# Patient Record
Sex: Female | Born: 1940 | Race: White | Hispanic: No | Smoking: Former smoker
Health system: Southern US, Community
[De-identification: ages and names within clinical notes are randomized; demographics above are authoritative.]

## PROBLEM LIST (undated history)

## (undated) DIAGNOSIS — R42 Dizziness and giddiness: Secondary | ICD-10-CM

## (undated) DIAGNOSIS — N939 Abnormal uterine and vaginal bleeding, unspecified: Secondary | ICD-10-CM

## (undated) DIAGNOSIS — F32A Depression, unspecified: Secondary | ICD-10-CM

## (undated) DIAGNOSIS — L900 Lichen sclerosus et atrophicus: Secondary | ICD-10-CM

## (undated) DIAGNOSIS — I6381 Other cerebral infarction due to occlusion or stenosis of small artery: Secondary | ICD-10-CM

## (undated) DIAGNOSIS — I10 Essential (primary) hypertension: Secondary | ICD-10-CM

## (undated) DIAGNOSIS — M1990 Unspecified osteoarthritis, unspecified site: Secondary | ICD-10-CM

## (undated) DIAGNOSIS — B019 Varicella without complication: Secondary | ICD-10-CM

## (undated) DIAGNOSIS — I639 Cerebral infarction, unspecified: Secondary | ICD-10-CM

## (undated) DIAGNOSIS — F329 Major depressive disorder, single episode, unspecified: Secondary | ICD-10-CM

## (undated) DIAGNOSIS — Z9289 Personal history of other medical treatment: Secondary | ICD-10-CM

## (undated) DIAGNOSIS — E785 Hyperlipidemia, unspecified: Secondary | ICD-10-CM

## (undated) DIAGNOSIS — F419 Anxiety disorder, unspecified: Secondary | ICD-10-CM

## (undated) DIAGNOSIS — G5602 Carpal tunnel syndrome, left upper limb: Secondary | ICD-10-CM

## (undated) DIAGNOSIS — K219 Gastro-esophageal reflux disease without esophagitis: Secondary | ICD-10-CM

## (undated) HISTORY — PX: JOINT REPLACEMENT: SHX530

## (undated) HISTORY — DX: Depression, unspecified: F32.A

## (undated) HISTORY — DX: Lichen sclerosus et atrophicus: L90.0

## (undated) HISTORY — DX: Abnormal uterine and vaginal bleeding, unspecified: N93.9

## (undated) HISTORY — PX: SKIN LESION EXCISION: SHX2412

## (undated) HISTORY — DX: Dizziness and giddiness: R42

## (undated) HISTORY — DX: Unspecified osteoarthritis, unspecified site: M19.90

## (undated) HISTORY — PX: COLONOSCOPY: SHX174

## (undated) HISTORY — DX: Hyperlipidemia, unspecified: E78.5

## (undated) HISTORY — DX: Varicella without complication: B01.9

## (undated) HISTORY — DX: Personal history of other medical treatment: Z92.89

## (undated) HISTORY — DX: Other cerebral infarction due to occlusion or stenosis of small artery: I63.81

## (undated) HISTORY — PX: BACK SURGERY: SHX140

## (undated) HISTORY — PX: EYE SURGERY: SHX253

## (undated) HISTORY — PX: TONSILLECTOMY AND ADENOIDECTOMY: SUR1326

## (undated) HISTORY — DX: Essential (primary) hypertension: I10

## (undated) HISTORY — DX: Major depressive disorder, single episode, unspecified: F32.9

---

## 2004-11-10 ENCOUNTER — Ambulatory Visit: Payer: Self-pay | Admitting: Gynecology

## 2005-10-04 ENCOUNTER — Ambulatory Visit: Payer: Self-pay | Admitting: Family Medicine

## 2005-12-20 ENCOUNTER — Ambulatory Visit: Payer: Self-pay | Admitting: Gynecology

## 2006-03-08 ENCOUNTER — Ambulatory Visit: Payer: Self-pay

## 2006-06-29 ENCOUNTER — Emergency Department: Payer: Self-pay | Admitting: Emergency Medicine

## 2006-07-03 ENCOUNTER — Ambulatory Visit: Payer: Self-pay | Admitting: Family Medicine

## 2006-11-26 ENCOUNTER — Ambulatory Visit: Payer: Self-pay | Admitting: Gynecology

## 2006-11-26 ENCOUNTER — Encounter (INDEPENDENT_AMBULATORY_CARE_PROVIDER_SITE_OTHER): Payer: Self-pay | Admitting: Specialist

## 2006-12-25 ENCOUNTER — Encounter: Admission: RE | Admit: 2006-12-25 | Discharge: 2006-12-25 | Payer: Self-pay | Admitting: Gynecology

## 2007-02-19 ENCOUNTER — Ambulatory Visit: Payer: Self-pay | Admitting: Family Medicine

## 2007-02-21 ENCOUNTER — Ambulatory Visit (HOSPITAL_COMMUNITY): Admission: RE | Admit: 2007-02-21 | Discharge: 2007-02-21 | Payer: Self-pay | Admitting: Gynecology

## 2007-04-17 ENCOUNTER — Encounter: Admission: RE | Admit: 2007-04-17 | Discharge: 2007-04-17 | Payer: Self-pay | Admitting: Neurosurgery

## 2007-11-27 ENCOUNTER — Ambulatory Visit: Payer: Self-pay | Admitting: General Practice

## 2008-02-25 ENCOUNTER — Ambulatory Visit: Payer: Self-pay | Admitting: Obstetrics and Gynecology

## 2008-02-25 ENCOUNTER — Encounter: Payer: Self-pay | Admitting: Obstetrics and Gynecology

## 2009-10-12 ENCOUNTER — Ambulatory Visit: Payer: Self-pay | Admitting: Gastroenterology

## 2009-10-14 ENCOUNTER — Ambulatory Visit: Payer: Self-pay | Admitting: Nurse Practitioner

## 2010-08-19 ENCOUNTER — Ambulatory Visit: Payer: Self-pay | Admitting: Urology

## 2010-08-22 ENCOUNTER — Ambulatory Visit: Payer: Self-pay | Admitting: Urology

## 2010-11-15 ENCOUNTER — Ambulatory Visit: Payer: Self-pay | Admitting: Otolaryngology

## 2010-12-20 NOTE — Assessment & Plan Note (Signed)
NAME:  Ashley [name], KE NO.:  [id number]   MEDICAL RECORD NO.:  [id number]          PATIENT TYPE:  POB   LOCATION:  [facility] at [facility]         FACILITY:  [facility]   PHYSICIAN:  Argentina Donovan, MD        DATE OF BIRTH:  1941/05/18   DATE OF SERVICE:  02/25/2008                                  CLINIC NOTE   The patient is a 70 year old Caucasian female who went through menopause  in the late 30s to early 45s and has been on estrogen ever since.  She  came in with some spotting 1 year ago, had an endometrial biopsy, which  was reported to her as normal.  She was having terrible hot flashes and  at that time she was Prempro and was added 625 of Premarin in addition.  She has had no bleeding at all over the past year.  An ultrasound a year  ago showed a normal endometrial stripe.  She is feeling very well.  She  is on several other medications; Vytorin, Effexor, aspirin daily,  Prilosec, Trimplex, vitamin B12, and calcium.  She does not smoke and up  until a few years ago, was a marathon runner.   PHYSICAL EXAMINATION:  VITAL SIGNS:  Her blood pressure was 145/87,  pulse 74.  The patient weighs 130 pounds, is 5 feet __________ inches  tall, i.e., she lost 15 pounds over the past year voluntarily.  HEENT:  Within normal limits.  NECK:  Supple.  The thyroid is symmetrical with no masses.  LUNGS:  Clear to auscultation and percussion.  HEART:  No murmur.  Normal sinus rhythm.  ABDOMEN:  Soft, flat, and nontender.  No masses, no organomegaly.  GENITOURINARY:  External genitalia is normal.  BUS within normal limits.  The vagina is clean and quite well rugated.  The cervix is clean and  stenotic.  The uterus is anterior, normal size, shape, and consistency.  The adnexa could not be palpated.  RECTAL:  No masses.   IMPRESSION:  Normal gynecological examination.   PLAN:  Mammogram followup.  Continue on her Prempro at her desire as  well as the Premarin.  We have discussed  possible risks and the benefits  in this lady's case.           ______________________________  Argentina Donovan, MD     PR/MEDQ  D:  02/25/2008  T:  02/26/2008  Job:  [id number]

## 2014-12-28 DIAGNOSIS — E7800 Pure hypercholesterolemia, unspecified: Secondary | ICD-10-CM | POA: Insufficient documentation

## 2014-12-28 DIAGNOSIS — F39 Unspecified mood [affective] disorder: Secondary | ICD-10-CM | POA: Insufficient documentation

## 2014-12-28 DIAGNOSIS — F325 Major depressive disorder, single episode, in full remission: Secondary | ICD-10-CM | POA: Insufficient documentation

## 2014-12-28 DIAGNOSIS — F32A Depression, unspecified: Secondary | ICD-10-CM | POA: Insufficient documentation

## 2014-12-28 DIAGNOSIS — F329 Major depressive disorder, single episode, unspecified: Secondary | ICD-10-CM | POA: Insufficient documentation

## 2014-12-28 DIAGNOSIS — E785 Hyperlipidemia, unspecified: Secondary | ICD-10-CM | POA: Insufficient documentation

## 2015-08-10 DIAGNOSIS — H18411 Arcus senilis, right eye: Secondary | ICD-10-CM | POA: Diagnosis not present

## 2015-08-10 DIAGNOSIS — H26491 Other secondary cataract, right eye: Secondary | ICD-10-CM | POA: Diagnosis not present

## 2015-08-10 DIAGNOSIS — Z961 Presence of intraocular lens: Secondary | ICD-10-CM | POA: Diagnosis not present

## 2015-09-14 DIAGNOSIS — M47812 Spondylosis without myelopathy or radiculopathy, cervical region: Secondary | ICD-10-CM | POA: Diagnosis not present

## 2015-09-14 DIAGNOSIS — M542 Cervicalgia: Secondary | ICD-10-CM | POA: Diagnosis not present

## 2015-11-11 DIAGNOSIS — R42 Dizziness and giddiness: Secondary | ICD-10-CM | POA: Diagnosis not present

## 2015-11-11 DIAGNOSIS — H6121 Impacted cerumen, right ear: Secondary | ICD-10-CM | POA: Diagnosis not present

## 2015-11-29 DIAGNOSIS — Z961 Presence of intraocular lens: Secondary | ICD-10-CM | POA: Diagnosis not present

## 2015-12-15 DIAGNOSIS — R69 Illness, unspecified: Secondary | ICD-10-CM | POA: Diagnosis not present

## 2015-12-15 DIAGNOSIS — I10 Essential (primary) hypertension: Secondary | ICD-10-CM | POA: Diagnosis not present

## 2015-12-15 DIAGNOSIS — E7800 Pure hypercholesterolemia, unspecified: Secondary | ICD-10-CM | POA: Diagnosis not present

## 2016-02-24 DIAGNOSIS — R1031 Right lower quadrant pain: Secondary | ICD-10-CM | POA: Diagnosis not present

## 2016-02-25 ENCOUNTER — Other Ambulatory Visit: Payer: Self-pay | Admitting: Internal Medicine

## 2016-02-25 ENCOUNTER — Ambulatory Visit
Admission: RE | Admit: 2016-02-25 | Discharge: 2016-02-25 | Disposition: A | Discharge status: Home / Self Care | Payer: Medicare HMO | Source: Ambulatory Visit | Attending: Internal Medicine | Admitting: Internal Medicine

## 2016-02-25 DIAGNOSIS — K760 Fatty (change of) liver, not elsewhere classified: Secondary | ICD-10-CM | POA: Insufficient documentation

## 2016-02-25 DIAGNOSIS — R160 Hepatomegaly, not elsewhere classified: Secondary | ICD-10-CM | POA: Diagnosis not present

## 2016-02-25 DIAGNOSIS — I700 Atherosclerosis of aorta: Secondary | ICD-10-CM | POA: Diagnosis not present

## 2016-02-25 DIAGNOSIS — R319 Hematuria, unspecified: Secondary | ICD-10-CM

## 2016-02-25 DIAGNOSIS — H6123 Impacted cerumen, bilateral: Secondary | ICD-10-CM | POA: Diagnosis not present

## 2016-02-25 DIAGNOSIS — H6062 Unspecified chronic otitis externa, left ear: Secondary | ICD-10-CM | POA: Diagnosis not present

## 2016-02-25 DIAGNOSIS — R1031 Right lower quadrant pain: Secondary | ICD-10-CM | POA: Diagnosis not present

## 2016-03-01 DIAGNOSIS — R3129 Other microscopic hematuria: Secondary | ICD-10-CM | POA: Diagnosis not present

## 2016-03-16 DIAGNOSIS — R9341 Abnormal radiologic findings on diagnostic imaging of renal pelvis, ureter, or bladder: Secondary | ICD-10-CM | POA: Diagnosis not present

## 2016-03-16 DIAGNOSIS — R1031 Right lower quadrant pain: Secondary | ICD-10-CM | POA: Diagnosis not present

## 2016-03-28 DIAGNOSIS — R9341 Abnormal radiologic findings on diagnostic imaging of renal pelvis, ureter, or bladder: Secondary | ICD-10-CM | POA: Diagnosis not present

## 2016-03-30 ENCOUNTER — Ambulatory Visit (INDEPENDENT_AMBULATORY_CARE_PROVIDER_SITE_OTHER): Payer: Medicare HMO | Admitting: Podiatry

## 2016-03-30 VITALS — BP 142/88 | HR 97 | Resp 14

## 2016-03-30 DIAGNOSIS — M205X1 Other deformities of toe(s) (acquired), right foot: Secondary | ICD-10-CM

## 2016-03-30 DIAGNOSIS — T300 Burn of unspecified body region, unspecified degree: Secondary | ICD-10-CM

## 2016-03-30 DIAGNOSIS — M2041 Other hammer toe(s) (acquired), right foot: Secondary | ICD-10-CM | POA: Diagnosis not present

## 2016-03-30 DIAGNOSIS — L84 Corns and callosities: Secondary | ICD-10-CM

## 2016-03-30 NOTE — Progress Notes (Signed)
   Subjective:    Patient ID: Ashley [name], female    DOB: July 09, 1941, 75 y.o.   MRN: [id number]  HPI this patient presents the office concerned about her second toe on her right foot. She says she has developed a corn on the tip of her right foot for the last 6 months. She says she applied acid to the corn approximately 3-4 days ago. She says the area has turned white and she is just concerned.  She presents the office for an evaluation and treatment of this. Callused area second toe right foot    Review of Systems  All other systems reviewed and are negative.      Objective:   Physical Exam GENERAL APPEARANCE: Alert, conversant. Appropriately groomed. No acute distress.  VASCULAR: Pedal pulses are  palpable at  [facility] bilateral.  Capillary refill time is immediate to all digits,  Normal temperature gradient.   NEUROLOGIC: sensation is normal to 5.07 monofilament at 5/5 sites bilateral.  Light touch is intact bilateral, Muscle strength normal.  MUSCULOSKELETAL: acceptable muscle strength, tone and stability bilateral.  Intrinsic muscluature intact bilateral.  Rectus appearance of foot and digits noted bilateral. Mallet toe second toe  B/L  DERMATOLOGIC: skin color, texture, and turgor are within normal limits.  No preulcerative lesions or ulcers  are seen, no interdigital maceration noted.  No open lesions present.  . No drainage noted.White necrotic tissue noted at the distal aspect lateral border second toe right foot.  No redness or swelling or infection.  Fissure noted at callus/burn site.         Assessment & Plan:  Burn second toe right foot.  Mallet tpoe second right foot.  IE  Debride callus/burned tissue.  Neosporin/DSD.  Home soaks were recommended.  Gel pad was dispensed.  Discussed possible toe surgery .   Helane GuntherGregory Mayer DPM

## 2016-04-11 ENCOUNTER — Ambulatory Visit: Payer: Self-pay | Admitting: Podiatry

## 2016-04-15 ENCOUNTER — Encounter: Payer: Self-pay | Admitting: Emergency Medicine

## 2016-04-15 ENCOUNTER — Emergency Department
Admission: EM | Admit: 2016-04-15 | Discharge: 2016-04-16 | Disposition: A | Discharge status: Home / Self Care | Payer: Medicare HMO | Attending: Emergency Medicine | Admitting: Emergency Medicine

## 2016-04-15 DIAGNOSIS — Z791 Long term (current) use of non-steroidal anti-inflammatories (NSAID): Secondary | ICD-10-CM | POA: Insufficient documentation

## 2016-04-15 DIAGNOSIS — Z7982 Long term (current) use of aspirin: Secondary | ICD-10-CM | POA: Insufficient documentation

## 2016-04-15 DIAGNOSIS — I10 Essential (primary) hypertension: Secondary | ICD-10-CM | POA: Diagnosis not present

## 2016-04-15 DIAGNOSIS — T783XXA Angioneurotic edema, initial encounter: Secondary | ICD-10-CM | POA: Diagnosis not present

## 2016-04-15 DIAGNOSIS — Z79899 Other long term (current) drug therapy: Secondary | ICD-10-CM | POA: Insufficient documentation

## 2016-04-15 DIAGNOSIS — R220 Localized swelling, mass and lump, head: Secondary | ICD-10-CM | POA: Diagnosis present

## 2016-04-15 MED ORDER — FAMOTIDINE IN NACL 20-0.9 MG/50ML-% IV SOLN
INTRAVENOUS | Status: AC
  Filled 2016-04-15: qty 50

## 2016-04-15 MED ORDER — DIPHENHYDRAMINE HCL 50 MG/ML IJ SOLN
50.0000 mg | Freq: Once | INTRAMUSCULAR | Status: AC
  Administered 2016-04-15: 50 mg via INTRAVENOUS

## 2016-04-15 MED ORDER — SODIUM CHLORIDE 0.9 % IV BOLUS (SEPSIS)
1000.0000 mL | Freq: Once | INTRAVENOUS | Status: AC
  Administered 2016-04-15: 1000 mL via INTRAVENOUS

## 2016-04-15 MED ORDER — HYDROCHLOROTHIAZIDE 25 MG PO TABS: 25.0000 mg | ORAL_TABLET | Freq: Every day | ORAL | 1 refills | Status: DC

## 2016-04-15 MED ORDER — METHYLPREDNISOLONE SODIUM SUCC 125 MG IJ SOLR
INTRAMUSCULAR | Status: AC
  Administered 2016-04-15: 125 mg via INTRAVENOUS
  Filled 2016-04-15: qty 2

## 2016-04-15 MED ORDER — DIPHENHYDRAMINE HCL 50 MG/ML IJ SOLN
INTRAMUSCULAR | Status: AC
  Administered 2016-04-15: 50 mg via INTRAVENOUS
  Filled 2016-04-15: qty 1

## 2016-04-15 MED ORDER — METHYLPREDNISOLONE SODIUM SUCC 125 MG IJ SOLR
125.0000 mg | Freq: Once | INTRAMUSCULAR | Status: AC
  Administered 2016-04-15: 125 mg via INTRAVENOUS

## 2016-04-15 NOTE — Discharge Instructions (Signed)
As we discussed please follow-up with Dr. Dareen PianoAnderson as soon as possible for recheck/reevaluation. Please discontinue use of losartan, and start hydrochlorothiazide. Return to the emergency department for any trouble breathing, any swelling in your mouth last throat, or any other symptom personally concerning to your self.

## 2016-04-15 NOTE — ED Provider Notes (Signed)
[facility] Emergency Department Provider Note  Time seen: 10:23 PM  I have reviewed the triage vital signs and the nursing notes.   HISTORY  Chief Complaint Allergic Reaction    HPI Ashley [name] is a 75 y.o. female with a past medical history of hypertension, hyperlipidemia, gastric reflux, who presents the emergency department with tongue swelling. According to the patient she ate shrimp tonight which she believes could've caused an allergic reaction although she states she has never been allergic to shrimp in the past. She does state 10 years he has she had a similar event causing tongue swelling but they never found out why. Patient denies any rash or itching, patient does have significant tongue swelling especially of the right side. Denies any trouble breathing at this time.  History reviewed. No pertinent past medical history.  There are no active problems to display for this patient.   History reviewed. No pertinent surgical history.  Prior to Admission medications   Medication Sig Start Date End Date Taking? Authorizing Provider  acetaminophen (TYLENOL) 500 MG tablet Take 1,000 mg by mouth.    Historical Provider, MD  aspirin EC 81 MG tablet Take 81 mg by mouth.    Historical Provider, MD  Cholecalciferol (D 1000) 1000 units CHEW Chew by mouth daily.     Historical Provider, MD  ciprofloxacin (CIPRO) 500 MG tablet Take 500 mg by mouth. 03/28/16   Historical Provider, MD  estradiol (ESTRACE) 1 MG tablet Take 1-2 tabs po once a day 06/10/15   Historical Provider, MD  fluticasone (FLONASE) 50 MCG/ACT nasal spray Place into the nose.    Historical Provider, MD  losartan-hydrochlorothiazide (HYZAAR) 100-12.5 MG tablet daily.  12/15/15   Historical Provider, MD  mometasone (ELOCON) 0.1 % lotion  02/25/16   Historical Provider, MD  niacin 500 MG tablet Take 500 mg by mouth.    Historical Provider, MD  Omega-3 1000 MG CAPS Take by mouth 2 (two) times daily.      Historical Provider, MD  omeprazole (PRILOSEC) 20 MG capsule TAKE 1 CAPSULE BY MOUTH EVERY DAY 07/05/15   Historical Provider, MD  progesterone (PROMETRIUM) 100 MG capsule daily.  03/13/16   Historical Provider, MD  venlafaxine XR (EFFEXOR-XR) 150 MG 24 hr capsule  01/12/16   Historical Provider, MD  vitamin C (ASCORBIC ACID) 500 MG tablet Take by mouth.    Historical Provider, MD    Allergies  Allergen Reactions  . Pravastatin Other (See Comments)    Other reaction(s): Muscle Pain  . Simvastatin Other (See Comments)    stiffness    History reviewed. No pertinent family history.  Social History Social History  Substance Use Topics  . Smoking status: Never Smoker  . Smokeless tobacco: Never Used  . Alcohol use Yes    Review of Systems Constitutional: Negative for fever. Cardiovascular: Negative for chest pain. Respiratory: Negative for shortness of breath.Negative for cough. Gastrointestinal: Negative for abdominal pain Skin: Negative for rash. Neurological: Negative for headache 10-point ROS otherwise negative.  ____________________________________________   PHYSICAL EXAM:  VITAL SIGNS: ED Triage Vitals  Enc Vitals Group     BP 04/15/16 2209 (!) 179/93     Pulse Rate 04/15/16 2209 99     Resp 04/15/16 2209 20     Temp 04/15/16 2209 97.6 F (36.4 C)     Temp Source 04/15/16 2209 Oral     SpO2 04/15/16 2209 99 %     Weight 04/15/16 2210 150 lb (68  kg)     Height 04/15/16 2210 4\' 10"  (1.473 m)     Head Circumference --      Peak Flow --      Pain Score 04/15/16 2210 0     Pain Loc --      Pain Edu? --      Excl. in GC? --     Constitutional: Alert and oriented. Well appearing and in no distress. Eyes: Normal exam ENT   Head: Normocephalic and atraumatic.   Mouth/Throat: Mucous membranes are moist.Moderate right-sided tongue swelling with mild left-sided tongue swelling, no lip swelling, oral pharynx appears normal. Cardiovascular: Normal rate, regular  rhythm. No murmur Respiratory: Normal respiratory effort without tachypnea nor retractions. Breath sounds are clear Gastrointestinal: Soft and nontender. No distention.   Musculoskeletal: Nontender with normal range of motion in all extremities. N Neurologic:  Normal speech and language. No gross focal neurologic deficits are appreciated. Skin:  Skin is warm, dry and intact. No rash noted. No urticaria. Psychiatric: Mood and affect are normal. Speech and behavior are normal.   ____________________________________________    INITIAL IMPRESSION / ASSESSMENT AND PLAN / ED COURSE  Pertinent labs & imaging results that were available during my care of the patient were reviewed by me and considered in my medical decision making (see chart for details).  The patient presents to the emergency department with right-sided tongue swelling. No hives or urticaria. Clear lung sounds. Exam is very consistent with angioedema. I reviewed the patient's medications, patient does take losartan but no ACE inhibitor.  We will treat with Solu-Medrol, fluids, IV Benadryl, IV Pepcid. Patient will require prolonged observation in the emergency department to ensure decrease in the size of her tongue. Currently there is no airway compromise. Patient able swallow secretions without issue.   ----------------------------------------- 11:18 PM on 04/15/2016 -----------------------------------------  Tongue appears slightly improved. We will continue to monitor in the emergency department. I highly suspect this is angioedema versus allergic reaction. I discussed with the patient stopping losartan and instead increasing her hydrochlorothiazide. Patient will follow up with her primary care doctor. Patient care signed out to Dr. Manson Passey for continued ER observation.  ____________________________________________   FINAL CLINICAL IMPRESSION(S) / ED DIAGNOSES  Angioedema    Minna Antis, MD 04/15/16 [phone]

## 2016-04-15 NOTE — ED Triage Notes (Addendum)
Pt reports allergic reaction, swelling of tongue and tightness in throat, reports difficulty breathing.  States she ate shrimp tonight and this has happened with shrimp in the past.  Pt reports taking 25mg  benadryl PTA

## 2016-04-16 DIAGNOSIS — Z7982 Long term (current) use of aspirin: Secondary | ICD-10-CM | POA: Diagnosis not present

## 2016-04-16 DIAGNOSIS — T783XXA Angioneurotic edema, initial encounter: Secondary | ICD-10-CM | POA: Diagnosis not present

## 2016-04-16 DIAGNOSIS — Z79899 Other long term (current) drug therapy: Secondary | ICD-10-CM | POA: Diagnosis not present

## 2016-04-16 DIAGNOSIS — Z791 Long term (current) use of non-steroidal anti-inflammatories (NSAID): Secondary | ICD-10-CM | POA: Diagnosis not present

## 2016-04-16 DIAGNOSIS — I10 Essential (primary) hypertension: Secondary | ICD-10-CM | POA: Diagnosis not present

## 2016-04-16 NOTE — ED Provider Notes (Signed)
Assumed care the patient from Dr.Paduchowski. Patient states that she feels much better and that her tongue has decreased in size considerably and is requesting discharge at this time.   Ashley Currentandolph N Brown, MD 04/16/16 [phone]

## 2016-04-16 NOTE — ED Notes (Signed)
Discharge instructions reviewed with patient. Patient verbalized understanding. Patient ambulated to lobby without difficulty.   

## 2016-04-17 ENCOUNTER — Other Ambulatory Visit: Payer: Self-pay | Admitting: Physical Medicine and Rehabilitation

## 2016-04-17 DIAGNOSIS — M5416 Radiculopathy, lumbar region: Secondary | ICD-10-CM

## 2016-04-17 DIAGNOSIS — M5136 Other intervertebral disc degeneration, lumbar region: Secondary | ICD-10-CM | POA: Diagnosis not present

## 2016-04-18 DIAGNOSIS — I10 Essential (primary) hypertension: Secondary | ICD-10-CM | POA: Diagnosis not present

## 2016-04-18 DIAGNOSIS — T783XXD Angioneurotic edema, subsequent encounter: Secondary | ICD-10-CM | POA: Diagnosis not present

## 2016-04-18 DIAGNOSIS — T783XXA Angioneurotic edema, initial encounter: Secondary | ICD-10-CM | POA: Insufficient documentation

## 2016-04-26 ENCOUNTER — Ambulatory Visit
Admission: RE | Admit: 2016-04-26 | Discharge: 2016-04-26 | Disposition: A | Discharge status: Home / Self Care | Payer: Medicare HMO | Source: Ambulatory Visit | Attending: Physical Medicine and Rehabilitation | Admitting: Physical Medicine and Rehabilitation

## 2016-04-26 DIAGNOSIS — M5137 Other intervertebral disc degeneration, lumbosacral region: Secondary | ICD-10-CM | POA: Insufficient documentation

## 2016-04-26 DIAGNOSIS — M2578 Osteophyte, vertebrae: Secondary | ICD-10-CM | POA: Diagnosis not present

## 2016-04-26 DIAGNOSIS — M4806 Spinal stenosis, lumbar region: Secondary | ICD-10-CM | POA: Insufficient documentation

## 2016-04-26 DIAGNOSIS — M5416 Radiculopathy, lumbar region: Secondary | ICD-10-CM | POA: Diagnosis present

## 2016-04-26 DIAGNOSIS — M545 Low back pain: Secondary | ICD-10-CM | POA: Diagnosis not present

## 2016-04-26 DIAGNOSIS — M5126 Other intervertebral disc displacement, lumbar region: Secondary | ICD-10-CM | POA: Insufficient documentation

## 2016-04-26 DIAGNOSIS — M5127 Other intervertebral disc displacement, lumbosacral region: Secondary | ICD-10-CM | POA: Insufficient documentation

## 2016-04-26 DIAGNOSIS — I10 Essential (primary) hypertension: Secondary | ICD-10-CM | POA: Diagnosis not present

## 2016-04-26 DIAGNOSIS — M5136 Other intervertebral disc degeneration, lumbar region: Secondary | ICD-10-CM | POA: Insufficient documentation

## 2016-05-10 ENCOUNTER — Encounter: Payer: Self-pay | Admitting: Emergency Medicine

## 2016-05-10 ENCOUNTER — Emergency Department
Admission: EM | Admit: 2016-05-10 | Discharge: 2016-05-10 | Disposition: A | Discharge status: Home / Self Care | Payer: Medicare HMO | Attending: Student | Admitting: Student

## 2016-05-10 DIAGNOSIS — Z7982 Long term (current) use of aspirin: Secondary | ICD-10-CM | POA: Diagnosis not present

## 2016-05-10 DIAGNOSIS — R220 Localized swelling, mass and lump, head: Secondary | ICD-10-CM | POA: Diagnosis not present

## 2016-05-10 DIAGNOSIS — Z791 Long term (current) use of non-steroidal anti-inflammatories (NSAID): Secondary | ICD-10-CM | POA: Insufficient documentation

## 2016-05-10 DIAGNOSIS — T7840XA Allergy, unspecified, initial encounter: Secondary | ICD-10-CM | POA: Insufficient documentation

## 2016-05-10 DIAGNOSIS — I10 Essential (primary) hypertension: Secondary | ICD-10-CM | POA: Diagnosis not present

## 2016-05-10 DIAGNOSIS — Z79899 Other long term (current) drug therapy: Secondary | ICD-10-CM | POA: Insufficient documentation

## 2016-05-10 DIAGNOSIS — Z7951 Long term (current) use of inhaled steroids: Secondary | ICD-10-CM | POA: Insufficient documentation

## 2016-05-10 MED ORDER — FAMOTIDINE IN NACL 20-0.9 MG/50ML-% IV SOLN
20.0000 mg | Freq: Once | INTRAVENOUS | Status: AC
  Administered 2016-05-10: 20 mg via INTRAVENOUS
  Filled 2016-05-10: qty 50

## 2016-05-10 MED ORDER — DIPHENHYDRAMINE HCL 50 MG/ML IJ SOLN
12.5000 mg | Freq: Once | INTRAMUSCULAR | Status: AC
  Administered 2016-05-10: 12.5 mg via INTRAVENOUS
  Filled 2016-05-10: qty 1

## 2016-05-10 MED ORDER — METHYLPREDNISOLONE SODIUM SUCC 125 MG IJ SOLR
125.0000 mg | Freq: Once | INTRAMUSCULAR | Status: AC
  Administered 2016-05-10: 125 mg via INTRAVENOUS
  Filled 2016-05-10: qty 2

## 2016-05-10 MED ORDER — PREDNISONE 20 MG PO TABS: 60.0000 mg | ORAL_TABLET | Freq: Every day | ORAL | 0 refills | Status: AC

## 2016-05-10 NOTE — ED Provider Notes (Signed)
[facility] Emergency Department Provider Note   ____________________________________________   First MD Initiated Contact with Patient 05/10/16 1025     (approximate)  I have reviewed the triage vital signs and the nursing notes.   HISTORY  Chief Complaint Facial Swelling    HPI Ashley [name] is a 75 y.o. female with history of hypertension and GERD who presents for evaluation of left lower lip swelling noted this morning on awakening, mild, no modifying factors, constant. Patient reports that she awoke from sleep and noted that her left lower lip was swollen. She denies any shortness of breath, no nausea, vomiting, no rash. She was seen in this emergency department and treated for an allergic reaction involving swelling of her tongue just last month and is scheduled to follow up with an allergist. Her losartan was discontinued at that time and she has just been taking HCTZ for blood pressure control. She did not use an EpiPen today. She did take "a swig of Benadryl" prior to arrival. No chest pain or difficulty breathing   History reviewed. No pertinent past medical history.  There are no active problems to display for this patient.   History reviewed. No pertinent surgical history.  Prior to Admission medications   Medication Sig Start Date End Date Taking? Authorizing Provider  acetaminophen (TYLENOL) 500 MG tablet Take 1,000 mg by mouth.   Yes Historical Provider, MD  aspirin EC 81 MG tablet Take 81 mg by mouth.   Yes Historical Provider, MD  Cholecalciferol (D 1000) 1000 units CHEW Chew by mouth daily.    Yes Historical Provider, MD  estradiol (ESTRACE) 1 MG tablet Take 1-2 tabs po once a day 06/10/15  Yes Historical Provider, MD  fluticasone (FLONASE) 50 MCG/ACT nasal spray Place into the nose.   Yes Historical Provider, MD  hydrochlorothiazide (HYDRODIURIL) 25 MG tablet Take 1 tablet (25 mg total) by mouth daily. 04/15/16  Yes Minna Antis,  MD  niacin 500 MG tablet Take 500 mg by mouth.   Yes Historical Provider, MD  Omega-3 1000 MG CAPS Take by mouth 2 (two) times daily.    Yes Historical Provider, MD  omeprazole (PRILOSEC) 20 MG capsule TAKE 1 CAPSULE BY MOUTH EVERY DAY 07/05/15  Yes Historical Provider, MD  progesterone (PROMETRIUM) 100 MG capsule daily.  03/13/16  Yes Historical Provider, MD  venlafaxine XR (EFFEXOR-XR) 150 MG 24 hr capsule  01/12/16  Yes Historical Provider, MD    Allergies Pravastatin and Simvastatin  History reviewed. No pertinent family history.  Social History Social History  Substance Use Topics  . Smoking status: Never Smoker  . Smokeless tobacco: Never Used  . Alcohol use Yes    Review of Systems Constitutional: No fever/chills Eyes: No visual changes. ENT: No sore throat. Cardiovascular: Denies chest pain. Respiratory: Denies shortness of breath. Gastrointestinal: No abdominal pain.  No nausea, no vomiting.  No diarrhea.  No constipation. Genitourinary: Negative for dysuria. Musculoskeletal: Negative for back pain. Skin: Negative for rash. Neurological: Negative for headaches, focal weakness or numbness.  10-point ROS otherwise negative.  ____________________________________________   PHYSICAL EXAM:  Vitals:   05/10/16 1030 05/10/16 1115 05/10/16 1200 05/10/16 1205  BP: 140/78 (!) 143/82 (!) 143/86 (!) 143/86  Pulse: 81 83 79 76  Resp: 15 19 16 18   Temp:      TempSrc:      SpO2: 97% 98% 96% 97%  Weight:      Height:        VITAL  SIGNS: ED Triage Vitals [05/10/16 1014]  Enc Vitals Group     BP (!) 136/92     Pulse Rate 83     Resp 18     Temp 98 F (36.7 C)     Temp Source Oral     SpO2 97 %     Weight 144 lb (65.3 kg)     Height 4\' 11"  (1.499 m)     Head Circumference      Peak Flow      Pain Score      Pain Loc      Pain Edu?      Excl. in GC?     Constitutional: Alert and oriented. Well appearing and in no acute distress. Eyes: Conjunctivae are normal.  PERRL. EOMI. Head: Atraumatic. Nose: No congestion/rhinnorhea. Mouth/Throat: Mucous membranes are moist.  Oropharynx non-erythematous And nonedematous. Uvula is midline. Mild swelling isolated to the left lower lip doubt involvement of the tongue or the remainder of the face. Neck: No stridor. Supple without meningismus. Cardiovascular: Normal rate, regular rhythm. Grossly normal heart sounds.  Good peripheral circulation. Respiratory: Normal respiratory effort.  No retractions. Lungs CTAB. Gastrointestinal: Soft and nontender. No distention.  No CVA tenderness. Genitourinary: deferred Musculoskeletal: No lower extremity tenderness nor edema.  No joint effusions. Neurologic:  Normal speech and language. No gross focal neurologic deficits are appreciated. No gait instability. Skin:  Skin is warm, dry and intact. No rash noted. Psychiatric: Mood and affect are normal. Speech and behavior are normal.  ____________________________________________   LABS (all labs ordered are listed, but only abnormal results are displayed)  Labs Reviewed - No data to display ____________________________________________  EKG  none ____________________________________________  RADIOLOGY  none ____________________________________________   PROCEDURES  Procedure(s) performed: None  Procedures  Critical Care performed: No  ____________________________________________   INITIAL IMPRESSION / ASSESSMENT AND PLAN / ED COURSE  Pertinent labs & imaging results that were available during my care of the patient were reviewed by me and considered in my medical decision making (see chart for details).  Ashley Riffleatricia A Bricco is a 75 y.o. female with history of hypertension and GERD who presents for evaluation of left lower lip swelling noted this morning on awakening, mild, no modifying factors, constant. Exam, she is very well-appearing and in no acute distress. Her vital signs are stable, she is afebrile.  Her swelling appears to be isolated to the left lower lip and is mild. There is no involvement of the posterior oropharynx, no concern for impending airway compromise. No evidence to suggest anaphylaxis. We'll treat with Solu-Medrol, Pepcid, Benadryl and reassess. This may be recurrent allergic reaction that is not clear what she may be allergic to, or may represent recurrent angioedema though losartan has been discontinued.  ----------------------------------------- 12:46 PM on 05/10/2016 ----------------------------------------- Patient with near complete resolution of her lip swelling per she reports she feels well. Still with  no involvement of the posterior oropharynx my reassessment. We'll DC with prednisone. She reports she already has an EpiPen and does not need a prescription. We discussed return precautions and need for close follow-up and she is comfortable with the discharge plan. DC home.  Clinical Course     ____________________________________________   FINAL CLINICAL IMPRESSION(S) / ED DIAGNOSES  Final diagnoses:  Lip swelling  Allergic reaction, initial encounter      NEW MEDICATIONS STARTED DURING THIS VISIT:  New Prescriptions   No medications on file     Note:  This document was prepared using  [employer] and may include unintentional dictation errors.    Gayla Doss, MD 05/10/16 [phone]

## 2016-05-10 NOTE — ED Triage Notes (Signed)
Pt woke up this morning with facial swelling to left face. Pt took benadryl. Unsure what reaction to. Throat feels tight and tongue itchy but no respiratory distress at this time.

## 2016-05-10 NOTE — ED Notes (Signed)
Pt resting in bed, resp even and unlabored, pt awake, husband at bedside

## 2016-05-10 NOTE — ED Notes (Signed)
EDP at bedside  

## 2016-05-10 NOTE — ED Notes (Signed)
Pt assisted to the bathroom and assisted back to bed w/o incident.

## 2016-05-10 NOTE — ED Notes (Signed)
States she woke up this AM with swelling to left lip area, states she took unknown of benadryl at home when she noticed it, states she had same reaction several weeks ago to unknown source, pt speaking in full clear sentences, no resp distress

## 2016-05-17 DIAGNOSIS — R69 Illness, unspecified: Secondary | ICD-10-CM | POA: Diagnosis not present

## 2016-05-31 DIAGNOSIS — M5416 Radiculopathy, lumbar region: Secondary | ICD-10-CM | POA: Diagnosis not present

## 2016-05-31 DIAGNOSIS — M5136 Other intervertebral disc degeneration, lumbar region: Secondary | ICD-10-CM | POA: Diagnosis not present

## 2016-05-31 DIAGNOSIS — M48062 Spinal stenosis, lumbar region with neurogenic claudication: Secondary | ICD-10-CM | POA: Diagnosis not present

## 2016-05-31 DIAGNOSIS — M5126 Other intervertebral disc displacement, lumbar region: Secondary | ICD-10-CM | POA: Diagnosis not present

## 2016-06-08 DIAGNOSIS — J3081 Allergic rhinitis due to animal (cat) (dog) hair and dander: Secondary | ICD-10-CM | POA: Diagnosis not present

## 2016-06-08 DIAGNOSIS — H1045 Other chronic allergic conjunctivitis: Secondary | ICD-10-CM | POA: Diagnosis not present

## 2016-06-08 DIAGNOSIS — J301 Allergic rhinitis due to pollen: Secondary | ICD-10-CM | POA: Diagnosis not present

## 2016-06-08 DIAGNOSIS — T783XXA Angioneurotic edema, initial encounter: Secondary | ICD-10-CM | POA: Diagnosis not present

## 2016-06-08 DIAGNOSIS — J309 Allergic rhinitis, unspecified: Secondary | ICD-10-CM | POA: Diagnosis not present

## 2016-06-08 DIAGNOSIS — J3089 Other allergic rhinitis: Secondary | ICD-10-CM | POA: Diagnosis not present

## 2016-06-08 DIAGNOSIS — Z91013 Allergy to seafood: Secondary | ICD-10-CM | POA: Diagnosis not present

## 2016-06-12 DIAGNOSIS — I10 Essential (primary) hypertension: Secondary | ICD-10-CM | POA: Diagnosis not present

## 2016-06-12 DIAGNOSIS — E7800 Pure hypercholesterolemia, unspecified: Secondary | ICD-10-CM | POA: Diagnosis not present

## 2016-06-13 ENCOUNTER — Other Ambulatory Visit: Payer: Self-pay | Admitting: Obstetrics and Gynecology

## 2016-06-13 DIAGNOSIS — Z1231 Encounter for screening mammogram for malignant neoplasm of breast: Secondary | ICD-10-CM | POA: Diagnosis not present

## 2016-06-13 DIAGNOSIS — Z1211 Encounter for screening for malignant neoplasm of colon: Secondary | ICD-10-CM | POA: Diagnosis not present

## 2016-06-13 DIAGNOSIS — Z124 Encounter for screening for malignant neoplasm of cervix: Secondary | ICD-10-CM | POA: Diagnosis not present

## 2016-06-19 DIAGNOSIS — I10 Essential (primary) hypertension: Secondary | ICD-10-CM | POA: Diagnosis not present

## 2016-06-19 DIAGNOSIS — E7800 Pure hypercholesterolemia, unspecified: Secondary | ICD-10-CM | POA: Diagnosis not present

## 2016-06-19 DIAGNOSIS — Z0000 Encounter for general adult medical examination without abnormal findings: Secondary | ICD-10-CM | POA: Diagnosis not present

## 2016-06-19 DIAGNOSIS — R739 Hyperglycemia, unspecified: Secondary | ICD-10-CM | POA: Insufficient documentation

## 2016-06-19 DIAGNOSIS — R69 Illness, unspecified: Secondary | ICD-10-CM | POA: Diagnosis not present

## 2016-06-21 DIAGNOSIS — M48062 Spinal stenosis, lumbar region with neurogenic claudication: Secondary | ICD-10-CM | POA: Diagnosis not present

## 2016-06-21 DIAGNOSIS — M5126 Other intervertebral disc displacement, lumbar region: Secondary | ICD-10-CM | POA: Diagnosis not present

## 2016-06-21 DIAGNOSIS — M5416 Radiculopathy, lumbar region: Secondary | ICD-10-CM | POA: Diagnosis not present

## 2016-06-26 DIAGNOSIS — Z1231 Encounter for screening mammogram for malignant neoplasm of breast: Secondary | ICD-10-CM | POA: Diagnosis not present

## 2016-07-05 DIAGNOSIS — M48062 Spinal stenosis, lumbar region with neurogenic claudication: Secondary | ICD-10-CM | POA: Diagnosis not present

## 2016-07-05 DIAGNOSIS — M5126 Other intervertebral disc displacement, lumbar region: Secondary | ICD-10-CM | POA: Diagnosis not present

## 2016-07-05 DIAGNOSIS — M5416 Radiculopathy, lumbar region: Secondary | ICD-10-CM | POA: Diagnosis not present

## 2016-08-03 DIAGNOSIS — R69 Illness, unspecified: Secondary | ICD-10-CM | POA: Diagnosis not present

## 2016-08-08 ENCOUNTER — Other Ambulatory Visit: Payer: Self-pay | Admitting: Neurological Surgery

## 2016-08-08 DIAGNOSIS — M5442 Lumbago with sciatica, left side: Secondary | ICD-10-CM | POA: Diagnosis not present

## 2016-08-08 DIAGNOSIS — M47816 Spondylosis without myelopathy or radiculopathy, lumbar region: Secondary | ICD-10-CM | POA: Diagnosis not present

## 2016-08-08 DIAGNOSIS — M5416 Radiculopathy, lumbar region: Secondary | ICD-10-CM | POA: Diagnosis not present

## 2016-08-08 DIAGNOSIS — R200 Anesthesia of skin: Secondary | ICD-10-CM

## 2016-08-08 DIAGNOSIS — G8929 Other chronic pain: Secondary | ICD-10-CM | POA: Diagnosis not present

## 2016-08-08 DIAGNOSIS — M5136 Other intervertebral disc degeneration, lumbar region: Secondary | ICD-10-CM | POA: Diagnosis not present

## 2016-08-14 DIAGNOSIS — G8929 Other chronic pain: Secondary | ICD-10-CM | POA: Diagnosis not present

## 2016-08-14 DIAGNOSIS — M47816 Spondylosis without myelopathy or radiculopathy, lumbar region: Secondary | ICD-10-CM | POA: Diagnosis not present

## 2016-08-14 DIAGNOSIS — M5442 Lumbago with sciatica, left side: Secondary | ICD-10-CM | POA: Diagnosis not present

## 2016-08-14 DIAGNOSIS — M5416 Radiculopathy, lumbar region: Secondary | ICD-10-CM | POA: Diagnosis not present

## 2016-08-14 DIAGNOSIS — R200 Anesthesia of skin: Secondary | ICD-10-CM | POA: Diagnosis not present

## 2016-08-16 ENCOUNTER — Ambulatory Visit
Admission: RE | Admit: 2016-08-16 | Discharge: 2016-08-16 | Disposition: A | Discharge status: Home / Self Care | Payer: Medicare HMO | Source: Ambulatory Visit | Attending: Neurological Surgery | Admitting: Neurological Surgery

## 2016-08-16 DIAGNOSIS — R200 Anesthesia of skin: Secondary | ICD-10-CM

## 2016-08-16 DIAGNOSIS — M5126 Other intervertebral disc displacement, lumbar region: Secondary | ICD-10-CM | POA: Insufficient documentation

## 2016-08-16 DIAGNOSIS — M545 Low back pain: Secondary | ICD-10-CM | POA: Diagnosis not present

## 2016-08-16 DIAGNOSIS — G8929 Other chronic pain: Secondary | ICD-10-CM | POA: Insufficient documentation

## 2016-08-16 DIAGNOSIS — M4802 Spinal stenosis, cervical region: Secondary | ICD-10-CM | POA: Insufficient documentation

## 2016-08-16 DIAGNOSIS — I700 Atherosclerosis of aorta: Secondary | ICD-10-CM | POA: Diagnosis not present

## 2016-08-16 DIAGNOSIS — M5442 Lumbago with sciatica, left side: Secondary | ICD-10-CM | POA: Insufficient documentation

## 2016-08-16 DIAGNOSIS — M47812 Spondylosis without myelopathy or radiculopathy, cervical region: Secondary | ICD-10-CM | POA: Diagnosis not present

## 2016-08-16 DIAGNOSIS — M47816 Spondylosis without myelopathy or radiculopathy, lumbar region: Secondary | ICD-10-CM

## 2016-08-21 DIAGNOSIS — M5442 Lumbago with sciatica, left side: Secondary | ICD-10-CM | POA: Diagnosis not present

## 2016-08-21 DIAGNOSIS — M4802 Spinal stenosis, cervical region: Secondary | ICD-10-CM | POA: Diagnosis not present

## 2016-08-21 DIAGNOSIS — G8929 Other chronic pain: Secondary | ICD-10-CM | POA: Diagnosis not present

## 2016-08-21 DIAGNOSIS — M5412 Radiculopathy, cervical region: Secondary | ICD-10-CM | POA: Diagnosis not present

## 2016-08-21 DIAGNOSIS — M48062 Spinal stenosis, lumbar region with neurogenic claudication: Secondary | ICD-10-CM | POA: Diagnosis not present

## 2016-08-21 DIAGNOSIS — M5416 Radiculopathy, lumbar region: Secondary | ICD-10-CM | POA: Diagnosis not present

## 2016-09-08 DIAGNOSIS — Z91013 Allergy to seafood: Secondary | ICD-10-CM | POA: Diagnosis not present

## 2016-09-28 DIAGNOSIS — M47816 Spondylosis without myelopathy or radiculopathy, lumbar region: Secondary | ICD-10-CM | POA: Diagnosis not present

## 2016-09-28 DIAGNOSIS — M5126 Other intervertebral disc displacement, lumbar region: Secondary | ICD-10-CM | POA: Diagnosis not present

## 2016-09-28 DIAGNOSIS — M48061 Spinal stenosis, lumbar region without neurogenic claudication: Secondary | ICD-10-CM | POA: Diagnosis not present

## 2016-10-02 DIAGNOSIS — R000 Tachycardia, unspecified: Secondary | ICD-10-CM | POA: Diagnosis not present

## 2016-12-13 DIAGNOSIS — M47816 Spondylosis without myelopathy or radiculopathy, lumbar region: Secondary | ICD-10-CM | POA: Diagnosis not present

## 2016-12-13 DIAGNOSIS — M5126 Other intervertebral disc displacement, lumbar region: Secondary | ICD-10-CM | POA: Diagnosis not present

## 2016-12-13 DIAGNOSIS — M48061 Spinal stenosis, lumbar region without neurogenic claudication: Secondary | ICD-10-CM | POA: Diagnosis not present

## 2017-01-02 DIAGNOSIS — R102 Pelvic and perineal pain: Secondary | ICD-10-CM | POA: Diagnosis not present

## 2017-01-02 DIAGNOSIS — M5126 Other intervertebral disc displacement, lumbar region: Secondary | ICD-10-CM | POA: Diagnosis not present

## 2017-01-03 ENCOUNTER — Ambulatory Visit: Payer: Medicare HMO | Attending: Nurse Practitioner | Admitting: Physical Therapy

## 2017-01-03 ENCOUNTER — Encounter: Payer: Self-pay | Admitting: Physical Therapy

## 2017-01-03 DIAGNOSIS — G8929 Other chronic pain: Secondary | ICD-10-CM | POA: Diagnosis present

## 2017-01-03 DIAGNOSIS — M545 Low back pain: Secondary | ICD-10-CM | POA: Insufficient documentation

## 2017-01-03 NOTE — Therapy (Signed)
[facility] MAIN [facility] SERVICES [address] [place], [place], [address] Phone: [phone]   Fax:  [phone]  Physical Therapy Evaluation  Patient Details  Name: Ashley [name] MRN: [id number] Date of Birth: 04/23/1941 Referring Provider: Anastasia Fiedler NP, Dr. Samule Dry  Encounter Date: 01/03/2017      PT End of Session - 01/03/17 0959    Visit Number 1   Number of Visits 13   Date for PT Re-Evaluation 02/14/17   Authorization Type gcode 1   Authorization Time Period 10   PT Start Time 0916   PT Stop Time 1010   PT Time Calculation (min) 54 min   Activity Tolerance Patient tolerated treatment well;No increased pain   Behavior During Therapy WFL for tasks assessed/performed      Past Medical History:  Diagnosis Date  . Arthritis   . Depression    controlled with medication;   . Hypertension    controlled with medication;     History reviewed. No pertinent surgical history.  There were no vitals filed for this visit.       Subjective Assessment - 01/03/17 0921    Subjective 76 yo Female reports, "I've got a ruptured disc." She reports that end of last year she had 2 steroid injections which helped temporarily. She then went to see a Insurance account manager at [facility]. She had MRI and was referred to neurosurgery who recommended lumbar surgery. 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 no pain with sitting; will get stiff  with prolonged sitting;    How long can you stand comfortably? 2-3 min   How long can you walk comfortably? reports pain after walking about 5 min;    Diagnostic tests had MRI/CT scan of low back which shows arthritis and lumbar stenosis with disc bulge at L4-L5;    Patient Stated Goals to reduce back pain;    Currently in Pain? Yes   Pain Score 5    Pain Location Back   Pain Orientation Right;Lower   Pain Descriptors / Indicators Sharp;Throbbing   Pain Type Chronic pain   Pain Radiating Towards radiates to right groin area;    Pain Onset More than a month ago   Pain Frequency Intermittent   Aggravating Factors  standing/walking   Pain Relieving Factors rest, lying down;    Effect of Pain on Daily Activities decreased standing/walking tolerance;    Multiple Pain Sites No            [facility] PT Assessment - 01/03/17 0001      Assessment   Medical Diagnosis Low back pain   Referring Provider Anastasia Fiedler NP, Dr. Samule Dry   Onset Date/Surgical Date --  within last year   Hand Dominance Right   Next MD Visit next month   Prior Therapy denies any PT for this condition;      Precautions   Precautions None     Restrictions   Weight Bearing Restrictions No  Balance Screen   Has the patient fallen in the past 6 months No   Has the patient had a decrease in activity level because of a fear of falling?  Yes   Is the patient reluctant to leave their home because of a fear of falling?  No     Home Environment   Living Environment Private residence   Living Arrangements Spouse/significant other   Additional Comments Lives in single story home, 2 steps to enter with right rail; doesn't lift heavy trash; she is independent in cooking, cleaning, showers, etc;      Prior Function   Level of Independence Independent   Vocation Retired  works part time, Veterinary surgeon;    Vocation Requirements [place] counties; has to travel often;    Leisure work in garden, play with cat;  go to [employer]; go fishing;      Copy Status Within Functional Limits for tasks assessed     Observation/Other Assessments   Observations very nice woman; active; often will shift in chair when sitting;    Modified Oswertry 54 (severe disability)     Sensation   Light Touch Appears Intact   Stereognosis Appears Intact   Hot/Cold Appears Intact   Proprioception Appears Intact     Coordination   Gross Motor Movements are Fluid and Coordinated Yes   Fine Motor Movements are Fluid and Coordinated Yes     Posture/Postural Control   Posture Comments exhibits erect posture; has decreased thoracic kyphosis and decreased lumbar lordosis with flattened posture;      AROM   Overall AROM Comments BUE and BLE are [facility]   Lumbar Flexion 25   Lumbar Extension 10  increased pain   Lumbar - Right Side Bend 10  increased pain   Lumbar - Left Side Bend 15     Strength   Overall Strength Comments BLE grossly 5/5 without difficulty;      Palpation   Spinal mobility has stiffness throughout thoracolumbar spine; reports pain with pressure to L4, L5 spinous process that radiates across lower back; no pain reported with pressure to T12/L1;    Palpation comment denies any tenderness to palpation; does have tightness in lumbar paraspinals;      Special Tests    Special Tests Lumbar   Lumbar Tests FABER test;Prone Knee Bend Test;Straight Leg Raise     FABER test   findings Positive   Side --  bilaterally      Prone Knee Bend Test   Findings Positive   Side Left     Straight Leg Raise   Findings Negative   Side  --  bilaterally   Comment does have tightness in BLE hamstrings;      Transfers   Comments able to transfer independently; no difficulty;      Ambulation/Gait   Gait Comments ambulates with normal gait pattern; independent;      Standardized Balance Assessment   10 Meter Walk 1.25 m/s without AD, normal community ambulator;      High Level Balance    High Level Balance Comments SLS: 5 sec each LE without pain or difficulty; able to stand with feet together, eyes closed without difficulty; normal static and dynamic standing balance;             Objective measurements completed on examination: See above findings.    TREATMENT: Initiated HEP: Patient in hooklying position: Lumbar trunk rotation x1 min with cues to avoid painful ROM;  Single knee to chest 15 sec hold x2 bilaterally; Posterior pelvic tilt 5 sec hold x10 reps; Patient required min-moderate verbal/tactile cues for correct exercise technique.               PT Education - 01/03/17 0959    Education provided Yes   Education Details HEP initiated, findings, recommendations;    Person(s) Educated Patient   Methods Explanation;Demonstration;Verbal cues;Handout   Comprehension Verbalized understanding;Returned demonstration;Verbal cues required;Need further instruction             PT Long Term Goals - 01/03/17 1014      PT LONG TERM GOAL #1   Title Patient will be independent in home exercise program to improve strength/mobility for better functional independence with ADLs.   Time 6   Period Weeks   Status New     PT LONG TERM GOAL #2   Title Patient will report a worst pain of 3/10 on VAS in    low back         to improve tolerance with ADLs and reduced symptoms with activities.    Time 6   Period Weeks   Status New     PT LONG TERM GOAL #3   Title  Patient will reduce modified Oswestry score to <30 as to demonstrate less disability with ADLs including improved sleeping tolerance, walking/sitting tolerance etc for better mobility with ADLs.    Time 6   Period Weeks   Status New     PT LONG TERM GOAL #4   Title Patient will be able to stand for >15 min without increase in back/LE pain to improve standing tolerance for grocery shopping and work tasks.    Time 6   Period Weeks   Status New                Plan - 01/03/17 [id number]     Clinical Impression Statement 76 yo Female reports increased low back pain with intermittent LE radiculopathy over past year. She reports increased pain with standing and walking. Patient exhibits stiffness in lumbar spine and tightness in BLE hip and low back. She reports increased pain with lumbar extension and right lateral flexion. Patient is still independent in all ADLs and is working part time as an Nutritional therapistinsurance salesman. She tested positive for possible lumbar impingement but is inconsistent with pain in BLE intermittently. Patient would benefit from skilled PT intervention to improve lumbar ROM, reduce pain with ADLs and increase tolerance with standing tasks. She exhibits a lumbar flexion preference;    History and Personal Factors relevant to plan of care: lives with husband, is active and working part-time; has depression but it is controlled;    Clinical Presentation Evolving   Clinical Presentation due to: due to intermittent LE pain that changes and worsens with standing/walking tasks;    Clinical Decision Making Low   Rehab Potential Fair   Clinical Impairments Affecting Rehab Potential positive: good caregiver support, motivated; negative: chronic pain;    PT Frequency 2x / week   PT Duration 6 weeks   PT Treatment/Interventions Aquatic Therapy;Cryotherapy;Electrical Stimulation;Moist Heat;Traction;Neuromuscular re-education;Balance training;Therapeutic exercise;Therapeutic activities;Functional mobility training;Patient/family education;Manual techniques;Energy conservation;Passive range of motion;Dry needling   PT Next Visit Plan advance HEP, lumbar flexion, core stabilzation;    PT Home Exercise Plan initiated- see patient instructions;    Consulted and Agree with Plan of Care Patient      Patient will benefit from skilled therapeutic intervention in order to improve the following deficits and  impairments:  Hypomobility, Decreased activity tolerance, Increased fascial restricitons,  Pain, Increased muscle spasms, Decreased mobility, Decreased range of motion, Postural dysfunction, Impaired flexibility  Visit Diagnosis: Chronic bilateral low back pain, with sciatica presence unspecified - Plan: PT plan of care cert/re-cert      G-Codes - 01-04-2017 1016    Functional Assessment Tool Used (Outpatient Only) modified oswestry, 10 meter walk, strength, clinical judgement;    Functional Limitation Mobility: Walking and moving around   Mobility: Walking and Moving Around Current Status [phone]) At least 20 percent but less than 40 percent impaired, limited or restricted   Mobility: Walking and Moving Around Goal Status [phone]) At least 1 percent but less than 20 percent impaired, limited or restricted       Problem List There are no active problems to display for this patient.   Trotter,Margaret PT, DPT 01-04-2017, 10:18 AM  [facility] MAIN [facility] SERVICES [address] [place], [place], [address] Phone: [phone]   Fax:  [phone]  Name: REMONA BOOM MRN: [id number] Date of Birth: 09/18/1940

## 2017-01-03 NOTE — Patient Instructions (Signed)
Pelvic Tilt  Lying on back with knees bent, Flatten back by tightening stomach muscles and rocking hips back Hold for 5 sec, Repeat __10__ times per set. Do __1__ sets per session. Do __2__ sessions per day.  http://orth.exer.us/134    Copyright  VHI. All rights reserved. Knee to Chest (Flexion)   Pull knee toward chest. Feel stretch in lower back or buttock area. Breathing deeply, Hold __15__ seconds. Repeat with other knee. Repeat _2-3___ times. Do _2-3___ sessions per day.  http://gt2.exer.us/225   Copyright  VHI. All rights reserved.   Lower Trunk Rotation Stretch  Lying on back with knees bent, Keeping back flat and feet together, rotate knees side to side slowly and in pain free range of motion.  Hold _2___ seconds. Repeat for 1-2 minutes. Do __1__ sets per session. Do __2-3__ sessions per day.  http://orth.exer.us/122   Copyright  VHI. All rights reserved.   

## 2017-01-09 ENCOUNTER — Ambulatory Visit: Payer: Medicare HMO

## 2017-01-15 ENCOUNTER — Ambulatory Visit: Payer: Medicare HMO | Attending: Nurse Practitioner

## 2017-01-15 DIAGNOSIS — G8929 Other chronic pain: Secondary | ICD-10-CM | POA: Diagnosis not present

## 2017-01-15 DIAGNOSIS — M545 Low back pain: Secondary | ICD-10-CM | POA: Diagnosis not present

## 2017-01-15 NOTE — Therapy (Signed)
[facility] MAIN [facility] [address] [place], [place], [address] Phone: [phone]   Fax:  [phone]  Physical Therapy Treatment  Patient Details  Name: Ashley [name] MRN: [id number] Date of Birth: 1941/06/13 Referring Provider: Anastasia FiedlerBetty Marshall NP, Dr. Samule Dryoi  Encounter Date: 01/15/2017      PT End of Session - 01/15/17 1349    Visit Number 2   Number of Visits 13   Date for PT Re-Evaluation 02/14/17   Authorization Type gcode 2   Authorization Time Period 10   PT Start Time 1303   PT Stop Time 1346   PT Time Calculation (min) 43 min   Activity Tolerance Patient tolerated treatment well   Behavior During Therapy [facility] for tasks assessed/performed      Past Medical History:  Diagnosis Date  . Arthritis   . Depression    controlled with medication;   . Hypertension    controlled with medication;     History reviewed. No pertinent surgical history.  There were no vitals filed for this visit.      Subjective Assessment - 01/15/17 1310    Subjective Pt. overdid it while on vacation while mulching her yard. Her left leg is having shooting pain and her right groin is painful.    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 no pain with sitting; will get stiff with prolonged sitting;    How long can you stand comfortably? 2-3 min   How long can you walk comfortably? reports pain after walking about 5 min;    Diagnostic tests had MRI/CT scan of low back which shows arthritis and lumbar stenosis with disc bulge at L4-L5;    Patient Stated Goals to reduce back pain;    Currently in Pain? Yes   Pain Score 7    Pain Location Back   Pain Orientation Right;Left;Lower   Pain Descriptors / Indicators Aching;Throbbing;Shooting   Pain Radiating  Towards legs   Pain Onset More than a month ago     manual CPAs UPAs grade I-III to lumbar spine 3x 30 seconds each level. , hypomobile Hamstring, popliteal angle, IR, and ER stretch supine 2x60 seconds  Manual distraction with belt in supine 6x30 second holds in varying planes SAD and LAD bilateral  TherEx Supine hip flexor stretch 2x 60 seconds TA contraction with green swiss ball 10x 5 seconds TA contraction hooklying 10x 5 seconds TA contraction with UE raises 2x  20x Transfers in <>out of bed.    Pt. response to medical necessity: Pt. Will benefit from skilled physical therapy to improve lumbar ROM, reduce pain with ADLs, and increase tolerance with standing tasks.          PT Long Term Goals - 01/03/17 1014      PT LONG TERM GOAL #1   Title Patient will be independent in home exercise program to improve strength/mobility for better functional independence with ADLs.   Time 6   Period Weeks   Status New     PT LONG TERM GOAL #2   Title Patient will report a worst pain of 3/10 on VAS in    low back         to improve tolerance with ADLs and reduced symptoms with activities.    Time 6   Period Weeks   Status New  PT LONG TERM GOAL #3   Title  Patient will reduce modified Oswestry score to <30 as to demonstrate less disability with ADLs including improved sleeping tolerance, walking/sitting tolerance etc for better mobility with ADLs.    Time 6   Period Weeks   Status New     PT LONG TERM GOAL #4   Title Patient will be able to stand for >15 min without increase in back/LE pain to improve standing tolerance for grocery shopping and work tasks.    Time 6   Period Weeks   Status New               Plan - 01/15/17 1353    Clinical Impression Statement Patient presents with increased pain to session today from performing intense yard work. Hypomobility of lumbar spine improved with repeated CPA's and UPAs. Distraction of pt.'s bilateral hips reduced pain  levels. Tight bilateral LE's noted and was painful upon initial PROM. Pt. Will benefit from skilled physical therapy to improve lumbar ROM, reduce pain with ADLs, and increase tolerance with standing tasks.    Rehab Potential Fair   Clinical Impairments Affecting Rehab Potential positive: good caregiver support, motivated; negative: chronic pain;    PT Frequency 2x / week   PT Duration 6 weeks   PT Treatment/Interventions Aquatic Therapy;Cryotherapy;Electrical Stimulation;Moist Heat;Traction;Neuromuscular re-education;Balance training;Therapeutic exercise;Therapeutic activities;Functional mobility training;Patient/family education;Manual techniques;Energy conservation;Passive range of motion;Dry needling   PT Next Visit Plan advance HEP, lumbar flexion, core stabilzation;    PT Home Exercise Plan initiated- see patient instructions;    Consulted and Agree with Plan of Care Patient      Patient will benefit from skilled therapeutic intervention in order to improve the following deficits and impairments:  Hypomobility, Decreased activity tolerance, Increased fascial restricitons, Pain, Increased muscle spasms, Decreased mobility, Decreased range of motion, Postural dysfunction, Impaired flexibility  Visit Diagnosis: Chronic bilateral low back pain, with sciatica presence unspecified     Problem List There are no active problems to display for this patient. Precious Bard, PT, DPT   01/15/2017, 1:55 PM  [facility] MAIN [facility] SERVICES [address] [place], [place], [address] Phone: [phone]   Fax:  [phone]  Name: Ashley [name] MRN: [id number] Date of Birth: February 15, 1941

## 2017-01-15 NOTE — Patient Instructions (Addendum)
HIP: Flexors - Supine    Lie on edge of surface. Place leg off the surface, allow knee to bend. Bring other knee toward chest. Hold ___ seconds. ___ reps per set, ___ sets per day, ___ days per week Rest lowered foot on stool.  Copyright  VHI. All rights reserved.

## 2017-01-16 ENCOUNTER — Ambulatory Visit (INDEPENDENT_AMBULATORY_CARE_PROVIDER_SITE_OTHER): Payer: Medicare HMO | Admitting: Family Medicine

## 2017-01-16 ENCOUNTER — Encounter: Payer: Self-pay | Admitting: Family Medicine

## 2017-01-16 VITALS — BP 118/68 | HR 79 | Temp 97.9°F | Resp 16 | Ht <= 58 in | Wt 153.4 lb

## 2017-01-16 DIAGNOSIS — M48061 Spinal stenosis, lumbar region without neurogenic claudication: Secondary | ICD-10-CM | POA: Diagnosis not present

## 2017-01-16 DIAGNOSIS — F329 Major depressive disorder, single episode, unspecified: Secondary | ICD-10-CM

## 2017-01-16 DIAGNOSIS — K219 Gastro-esophageal reflux disease without esophagitis: Secondary | ICD-10-CM

## 2017-01-16 DIAGNOSIS — I10 Essential (primary) hypertension: Secondary | ICD-10-CM | POA: Diagnosis not present

## 2017-01-16 DIAGNOSIS — M47816 Spondylosis without myelopathy or radiculopathy, lumbar region: Secondary | ICD-10-CM | POA: Diagnosis not present

## 2017-01-16 DIAGNOSIS — F32A Depression, unspecified: Secondary | ICD-10-CM

## 2017-01-16 DIAGNOSIS — E785 Hyperlipidemia, unspecified: Secondary | ICD-10-CM | POA: Diagnosis not present

## 2017-01-16 DIAGNOSIS — R69 Illness, unspecified: Secondary | ICD-10-CM | POA: Diagnosis not present

## 2017-01-16 DIAGNOSIS — M5126 Other intervertebral disc displacement, lumbar region: Secondary | ICD-10-CM | POA: Diagnosis not present

## 2017-01-16 DIAGNOSIS — Z8041 Family history of malignant neoplasm of ovary: Secondary | ICD-10-CM | POA: Insufficient documentation

## 2017-01-16 MED ORDER — ROSUVASTATIN CALCIUM 10 MG PO TABS: 10.0000 mg | ORAL_TABLET | Freq: Every day | ORAL | 3 refills | Status: DC

## 2017-01-16 NOTE — Progress Notes (Signed)
Subjective:  Patient ID: Ashley RifflePatricia A [name], female    DOB: [birth year]  Age: 76 y.o. MRN: [id number]  CC: Establish care  HPI Ashley Riffleatricia A Broxterman is a 76 y.o. female presents to the clinic today to establish care. Issues are below.  Hypertension  At goal on HCTZ.  Hyperlipidemia  Uncontrolled.  Has not tolerated Simvastatin or Pravastatin. Will discuss today.  Depression  Stable on Effexor.   GERD  Stable on Prilosec.   PMH, Surgical Hx, Family Hx, Social History reviewed and updated as below.  Past Medical History:  Diagnosis Date  . Arthritis   . Chicken pox   . Depression   . Hyperlipidemia   . Hypertension    Past Surgical History:  Procedure Laterality Date  . SKIN LESION EXCISION    . TONSILLECTOMY AND ADENOIDECTOMY     Family History  Problem Relation Age of Onset  . Arthritis Mother   . Ovarian cancer Sister   . Heart disease Father    Social History  Substance Use Topics  . Smoking status: Former Smoker    Packs/day: 0.50    Years: 15.00    Types: Cigarettes    Quit date: 08/07/1976  . Smokeless tobacco: Never Used  . Alcohol use Yes     Comment: ocassionally    Review of Systems  HENT: Positive for hearing loss.   Genitourinary: Positive for difficulty urinating.  Musculoskeletal: Positive for back pain.  Psychiatric/Behavioral:       Sadness.   All other systems reviewed and are negative.   Objective:   Today's Vitals: BP 118/68 (BP Location: Left Arm, Patient Position: Sitting, Cuff Size: Normal)   Pulse 79   Temp 97.9 F (36.6 C) (Oral)   Resp 16   Ht 4\' 10"  (1.473 m)   Wt 153 lb 6 oz (69.6 kg)   SpO2 97%   BMI 32.06 kg/m   Physical Exam  Constitutional: She is oriented to person, place, and time. She appears well-developed and well-nourished. No distress.  HENT:  Head: Normocephalic and atraumatic.  Nose: Nose normal.  Mouth/Throat: Oropharynx is clear and moist. No oropharyngeal exudate.  Normal TM's bilaterally.     Eyes: Conjunctivae are normal. No scleral icterus.  Neck: Neck supple.  Cardiovascular: Normal rate and regular rhythm.   No murmur heard. Pulmonary/Chest: Effort normal and breath sounds normal. She has no wheezes. She has no rales.  Abdominal: Soft. She exhibits no distension. There is no tenderness. There is no rebound and no guarding.  Musculoskeletal: Normal range of motion. She exhibits no edema.  Lymphadenopathy:    She has no cervical adenopathy.  Neurological: She is alert and oriented to person, place, and time.  Skin: Skin is warm and dry. No rash noted.  Psychiatric: She has a normal mood and affect.  Vitals reviewed.    Assessment & Plan:   Problem List Items Addressed This Visit      Cardiovascular and Mediastinum   Essential hypertension - Primary    Stable on HCTZ. Continue.      Relevant Medications   rosuvastatin (CRESTOR) 10 MG tablet     Digestive   GERD (gastroesophageal reflux disease)    Stable on Prilosec. Continue.        Other   Hyperlipidemia    Patient open to a trial of another statin. Trial of Crestor.      Relevant Medications   rosuvastatin (CRESTOR) 10 MG tablet   Depression    Stable on  Effexor. Continue.         Meds ordered this encounter  Medications  . cetirizine (ZYRTEC) 10 MG tablet    Sig: Take by mouth.  Marland Kitchen ibuprofen (ADVIL,MOTRIN) 200 MG tablet    Sig: Take by mouth.  . rosuvastatin (CRESTOR) 10 MG tablet    Sig: Take 1 tablet (10 mg total) by mouth daily.    Dispense:  90 tablet    Refill:  3   Follow-up: Return in about 6 months (around 07/18/2017).  Everlene Other DO [facility]

## 2017-01-16 NOTE — Assessment & Plan Note (Signed)
Stable on HCTZ.  Continue. 

## 2017-01-16 NOTE — Assessment & Plan Note (Signed)
Stable on Prilosec.  Continue. 

## 2017-01-16 NOTE — Assessment & Plan Note (Signed)
Stable on Effexor. Continue.

## 2017-01-16 NOTE — Assessment & Plan Note (Signed)
Patient open to a trial of another statin. Trial of Crestor.

## 2017-01-16 NOTE — Patient Instructions (Signed)
We will call in your medication.  You're doing well.  Follow up in 6 months  Take care  Dr. Adriana Simasook

## 2017-01-17 ENCOUNTER — Ambulatory Visit: Payer: Medicare HMO

## 2017-01-17 DIAGNOSIS — L578 Other skin changes due to chronic exposure to nonionizing radiation: Secondary | ICD-10-CM | POA: Diagnosis not present

## 2017-01-17 DIAGNOSIS — H0264 Xanthelasma of left upper eyelid: Secondary | ICD-10-CM | POA: Diagnosis not present

## 2017-01-17 DIAGNOSIS — G8929 Other chronic pain: Secondary | ICD-10-CM | POA: Diagnosis not present

## 2017-01-17 DIAGNOSIS — M545 Low back pain: Principal | ICD-10-CM

## 2017-01-17 DIAGNOSIS — L821 Other seborrheic keratosis: Secondary | ICD-10-CM | POA: Diagnosis not present

## 2017-01-17 DIAGNOSIS — L812 Freckles: Secondary | ICD-10-CM | POA: Diagnosis not present

## 2017-01-17 NOTE — Therapy (Signed)
[place] [facility] MAIN [facility] SERVICES [address] [place], [place], [address] Phone: [phone]   Fax:  [phone]  Physical Therapy Treatment  Patient Details  Name: Ashley [name] MRN: [id number] Date of Birth: 23-Oct-1940 Referring Provider: Anastasia Fiedler NP, Dr. Samule Dry  Encounter Date: 01/17/2017      PT End of Session - 01/17/17 1445    Visit Number 3   Number of Visits 13   Date for PT Re-Evaluation 02/14/17   Authorization Type gcode [id number]   Authorization Time Period 10   PT Start Time 1300   PT Stop Time 1345   PT Time Calculation (min) 45 min   Activity Tolerance Patient tolerated treatment well   Behavior During Therapy [facility] for tasks assessed/performed      Past Medical History:  Diagnosis Date  . Arthritis   . Chicken pox   . Depression   . Hyperlipidemia   . Hypertension     Past Surgical History:  Procedure Laterality Date  . SKIN LESION EXCISION    . TONSILLECTOMY AND ADENOIDECTOMY      There were no vitals filed for this visit.      Subjective Assessment - 01/17/17 1302    Subjective Pt. went to the doctor yesterday and continues to have bilateral leg involvement with R groin pain.    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 no pain with sitting; will get stiff with prolonged sitting;    How long can you stand comfortably? 2-3 min   How long can you walk comfortably? reports pain after walking about 5 min;    Diagnostic tests had MRI/CT scan of low back which shows arthritis and lumbar stenosis with disc bulge at L4-L5;    Patient Stated Goals to reduce back pain;    Pain Score 8    Pain Location Groin   Pain Orientation Right   Pain Descriptors / Indicators Aching   Pain Onset More than a month ago       manual CPAs  UPAs grade I-III to lumbar spine 3x 30 seconds each level. , hypomobile Hamstring, popliteal angle, IR, and ER stretch supine 2x60 seconds each leg Manual distraction with belt in supine 6x30 second holds in varying planes  LAD bilateral R hip mobilization grade I-II AP 4x30 seconds, hypomobile, no increase in pain   TherEx Supine hip flexor stretch 2x 60 seconds TA contraction with green swiss ball 10x 5 seconds TA contraction hooklying 10x 5 seconds TA contraction with UE raises 1x  20x in hooklying Transfers in <>out of bed.  Trunk forward and side flexion  5x each direction 30 second holds with swiss ball   Pt. response to medical necessity: Pt. Will benefit from skilled physical therapy to improve lumbar ROM, reduce pain with ADLs, and increase tolerance with standing tasks         PT Long Term Goals - 01/03/17 1014      PT LONG TERM GOAL #1   Title Patient will be independent in home exercise program to improve strength/mobility for better functional independence with ADLs.   Time 6   Period Weeks   Status New     PT LONG TERM GOAL #2   Title Patient will report a worst pain of 3/10 on VAS in    low back  to improve tolerance with ADLs and reduced symptoms with activities.    Time 6   Period Weeks   Status New     PT LONG TERM GOAL #3   Title  Patient will reduce modified Oswestry score to <30 as to demonstrate less disability with ADLs including improved sleeping tolerance, walking/sitting tolerance etc for better mobility with ADLs.    Time 6   Period Weeks   Status New     PT LONG TERM GOAL #4   Title Patient will be able to stand for >15 min without increase in back/LE pain to improve standing tolerance for grocery shopping and work tasks.    Time 6   Period Weeks   Status New               Plan - 01/17/17 1451    Clinical Impression Statement Patient presents with R groin pain and symptoms shooting to left leg that improved after manual tx.  Hypomobility of lumbar spine improved with repeated CPA's and UPAs. Distraction of pt.'s bilateral hips reduced pain levels. PROM initially uncomfortable but improved with repetition. Pt. Will benefit from skilled physical therapy to improve lumbar ROM, reduce pain with ADLs, and increase tolerance with standing tasks.   Rehab Potential Fair   Clinical Impairments Affecting Rehab Potential positive: good caregiver support, motivated; negative: chronic pain;    PT Frequency 2x / week   PT Duration 6 weeks   PT Treatment/Interventions Aquatic Therapy;Cryotherapy;Electrical Stimulation;Moist Heat;Traction;Neuromuscular re-education;Balance training;Therapeutic exercise;Therapeutic activities;Functional mobility training;Patient/family education;Manual techniques;Energy conservation;Passive range of motion;Dry needling   PT Next Visit Plan advance HEP, lumbar flexion, core stabilzation;    PT Home Exercise Plan initiated- see patient instructions;    Consulted and Agree with Plan of Care Patient      Patient will benefit from skilled therapeutic intervention in order to improve the following deficits and impairments:  Hypomobility, Decreased activity tolerance, Increased fascial restricitons, Pain, Increased muscle spasms, Decreased mobility, Decreased range of motion, Postural dysfunction, Impaired flexibility  Visit Diagnosis: Chronic bilateral low back pain, with sciatica presence unspecified     Problem List Patient Active Problem List   Diagnosis Date Noted  . Family history of ovarian cancer 01/16/2017  . Essential hypertension 01/16/2017  . GERD (gastroesophageal reflux disease) 01/16/2017  . Depression 12/28/2014  . Hyperlipidemia 12/28/2014   Precious BardMarina Moser, PT, DPT   01/17/2017, 2:52 PM  [facility] [address] [place], [place], [address] Phone: [phone]   Fax:  [phone]  Name: Pricilla Riffleatricia A Oldenburg MRN:  [id number] Date of Birth: Dec 31, 1940

## 2017-01-19 DIAGNOSIS — R938 Abnormal findings on diagnostic imaging of other specified body structures: Secondary | ICD-10-CM | POA: Diagnosis not present

## 2017-01-19 DIAGNOSIS — R102 Pelvic and perineal pain: Secondary | ICD-10-CM | POA: Diagnosis not present

## 2017-01-19 DIAGNOSIS — N859 Noninflammatory disorder of uterus, unspecified: Secondary | ICD-10-CM | POA: Diagnosis not present

## 2017-01-22 ENCOUNTER — Ambulatory Visit: Payer: Medicare HMO

## 2017-01-22 DIAGNOSIS — M545 Low back pain: Secondary | ICD-10-CM | POA: Diagnosis not present

## 2017-01-22 DIAGNOSIS — G8929 Other chronic pain: Secondary | ICD-10-CM

## 2017-01-22 NOTE — Therapy (Signed)
[facility] [address] [place], [place], [address] Phone: [phone]   Fax:  [phone]  Physical Therapy Treatment  Patient Details  Name: Ashley [name] MRN: [id number] Date of Birth: [birth year] Referring Provider: Anastasia FiedlerBetty Marshall NP, Dr. Samule Dryoi  Encounter Date: 01/22/2017      PT End of Session - 01/22/17 2153    Visit Number 4   Number of Visits 13   Date for PT Re-Evaluation 02/14/17   Authorization Type gcode [id number]   Authorization Time Period 10   PT Start Time 1345   PT Stop Time 1430   PT Time Calculation (min) 45 min   Activity Tolerance Patient tolerated treatment well   Behavior [facility] assessed/performed      Past Medical History:  Diagnosis Date  . Arthritis   . Chicken pox   . Depression   . Hyperlipidemia   . Hypertension     Past Surgical History:  Procedure Laterality Date  . SKIN LESION EXCISION    . TONSILLECTOMY AND ADENOIDECTOMY      There were no vitals filed for this visit.      Subjective Assessment - 01/22/17 1347    Subjective Pain is decreasing. Pt. is feeling better and went to the gynecologist who said the groin pain in R is not from womans health.    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 no pain with sitting; will get stiff with prolonged sitting;    How long can you stand comfortably? 2-3 min   How long can you walk comfortably? reports pain after walking about 5 min;    Diagnostic tests had MRI/CT scan of low back which shows arthritis and lumbar stenosis with disc bulge at L4-L5;    Patient Stated Goals to reduce back pain;    Currently in Pain? Yes   Pain Score 5    Pain Location Hip   Pain Orientation Right   Pain Descriptors / Indicators Discomfort    Pain Type Acute pain   Pain Onset More than a month ago      manual CPAs UPAs grade I-III to lumbar spine 3x 30 seconds each level. , hypomobile Hamstring, popliteal angle, IR, and ER stretch supine 2x60 seconds each leg Manual distraction with belt in supine 8x30 second holds in varying planes  LAD, SAD 8x 30 seconds R hip mobilization grade I-III AP, PA4x30 seconds, hypomobile, no increase in pain Trigger point R piriformis 3 minutes   Heat applied in prone.    TherEx Supine hip flexor stretch 2x 60 seconds TA contraction 12x 5 second holds TA contraction marching Transfers in <>out of bed.  Trunk forward and side flexion  5x each direction 30 second holds with swiss ball Figure 4 stretch x 30 seconds with PT overpressure   Pt. response to medical necessity: Pt. Will benefit from skilled physical therapy to improve lumbar ROM, reduce pain with ADLs, and increase tolerance with standing tasks           PT Long Term Goals - 01/03/17 1014      PT LONG TERM GOAL #1   Title Patient will be independent in home exercise program to improve strength/mobility for better functional independence with ADLs.   Time 6   Period Weeks   Status New  PT LONG TERM GOAL #2   Title Patient will report a worst pain of 3/10 on VAS in    low back         to improve tolerance with ADLs and reduced symptoms with activities.    Time 6   Period Weeks   Status New     PT LONG TERM GOAL #3   Title  Patient will reduce modified Oswestry score to <30 as to demonstrate less disability with ADLs including improved sleeping tolerance, walking/sitting tolerance etc for better mobility with ADLs.    Time 6   Period Weeks   Status New     PT LONG TERM GOAL #4   Title Patient will be able to stand for >15 min without increase in back/LE pain to improve standing tolerance for grocery shopping and work tasks.    Time 6   Period Weeks   Status New               Plan - 01/22/17 2155     Clinical Impression Statement R piriformis has multiple trigger points/muscle knots that were partially alleviated with heat followed by STM/trigger points.Piriformis stretch additionally reduced pain. Hypomobility of lumbar spine improved with repeated CPA's and UPAs. Distraction of pt.'s hips combined with neural glides reduced pain levels. PROM initially uncomfortable but improved with repetition. Pt. Will benefit from skilled physical therapy to improve lumbar ROM, reduce pain with ADLs, and increase tolerance with standing   Rehab Potential Fair   Clinical Impairments Affecting Rehab Potential positive: good caregiver support, motivated; negative: chronic pain;    PT Frequency 2x / week   PT Duration 6 weeks   PT Treatment/Interventions Aquatic Therapy;Cryotherapy;Electrical Stimulation;Moist Heat;Traction;Neuromuscular re-education;Balance training;Therapeutic exercise;Therapeutic activities;Functional mobility training;Patient/family education;Manual techniques;Energy conservation;Passive range of motion;Dry needling   PT Next Visit Plan advance HEP, lumbar flexion, core stabilzation;    PT Home Exercise Plan initiated- see patient instructions;    Consulted and Agree with Plan of Care Patient      Patient will benefit from skilled therapeutic intervention in order to improve the following deficits and impairments:  Hypomobility, Decreased activity tolerance, Increased fascial restricitons, Pain, Increased muscle spasms, Decreased mobility, Decreased range of motion, Postural dysfunction, Impaired flexibility  Visit Diagnosis: Chronic bilateral low back pain, with sciatica presence unspecified     Problem List Patient Active Problem List   Diagnosis Date Noted  . Family history of ovarian cancer 01/16/2017  . Essential hypertension 01/16/2017  . GERD (gastroesophageal reflux disease) 01/16/2017  . Depression 12/28/2014  . Hyperlipidemia 12/28/2014  Precious Bard, PT,  DPT   01/22/2017, 9:57 PM  [facility] MAIN [facility] SERVICES [address] [place], [place], [address] Phone: [phone]   Fax:  [phone]  Name: Ashley [name] MRN: [id number] Date of Birth: 1940-10-13

## 2017-01-24 ENCOUNTER — Ambulatory Visit: Payer: Medicare HMO

## 2017-01-24 DIAGNOSIS — M545 Low back pain: Principal | ICD-10-CM

## 2017-01-24 DIAGNOSIS — G8929 Other chronic pain: Secondary | ICD-10-CM

## 2017-01-24 NOTE — Therapy (Signed)
[place] [facility] [address] [place], [place], [address] Phone: [phone]   Fax:  [phone]  Physical Therapy Treatment  Patient Details  Name: Ashley [name] MRN: [id number] Date of Birth: March 22, 1941 Referring Provider: Anastasia FiedlerBetty Marshall NP, Dr. Samule Dryoi  Encounter Date: 01/24/2017      PT End of Session - 01/24/17 1713    Visit Number 5   Number of Visits 13   Date for PT Re-Evaluation 02/14/17   Authorization Type gcode 5   Authorization Time Period 10   PT Start Time 1346   PT Stop Time 1430   PT Time Calculation (min) 44 min   Activity Tolerance Patient tolerated treatment well   Behavior During Therapy [facility] assessed/performed      Past Medical History:  Diagnosis Date  . Arthritis   . Chicken pox   . Depression   . Hyperlipidemia   . Hypertension     Past Surgical History:  Procedure Laterality Date  . SKIN LESION EXCISION    . TONSILLECTOMY AND ADENOIDECTOMY      There were no vitals filed for this visit.      Subjective Assessment - 01/24/17 1348    Subjective Pt. is having increased pain in left leg and decreased pain in R groin. Noticed after she was walking with her husband into his doctor appt.    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 no pain with sitting; will get stiff with prolonged sitting;    How long can you stand comfortably? 2-3 min   How long can you walk comfortably? reports pain after walking about 5 min;    Diagnostic tests had MRI/CT scan of low back which shows arthritis and lumbar stenosis with disc bulge at L4-L5;    Patient Stated Goals to reduce back pain;    Currently in Pain? Yes   Pain Score 4    Pain Location Leg   Pain Orientation Left   Pain Onset More than a month ago      Manual CPAs UPAs grade I-III to lumbar spine 3x 30 seconds each level. , L more hypomobile than R, R improved mobility compared to last session. Hamstring, popliteal angle, IR, and ER stretch supine 2x60 seconds each leg Manual distraction with belt in supine 8x30 second holds in varying planes  LAD R and L hip mobilization grade I-III AP, 4x30 seconds, hypomobile, no increase in pain STM/Trigger point R and L piriformis 3 minutes   Neural glides supine -SLR with ankle pumps. Leg stabilized on PT shoulder. 10x glides each time.    TherEx Supine hip flexor stretch 2x 60 seconds Hamstring stair stretch 2x30 seconds, pt. Requiring tactile and verbal cueing for positioning Transfers in <>out of bed.  Figure 4 stretch in supine x 30 seconds with PT overpressure Neural glides in supine: 90 90 5x with 5x df each leg Ankle pumps 20x  Review and educated new HEP    Pt. response to medical necessity: Pt. Will benefit from skilled physical therapy to improve lumbar ROM, reduce pain with ADLs, and increase tolerance with standing tasks           PT Long Term Goals - 01/03/17 1014      PT LONG TERM GOAL #1   Title Patient will be independent in home exercise  program to improve strength/mobility for better functional independence with ADLs.   Time 6   Period Weeks   Status New     PT LONG TERM GOAL #2   Title Patient will report a worst pain of 3/10 on VAS in    low back         to improve tolerance with ADLs and reduced symptoms with activities.    Time 6   Period Weeks   Status New     PT LONG TERM GOAL #3   Title  Patient will reduce modified Oswestry score to <30 as to demonstrate less disability with ADLs including improved sleeping tolerance, walking/sitting tolerance etc for better mobility with ADLs.    Time 6   Period Weeks   Status New     PT LONG TERM GOAL #4   Title Patient will be able to stand for >15 min without increase in back/LE pain to improve standing  tolerance for grocery shopping and work tasks.    Time 6   Period Weeks   Status New               Plan - 01/24/17 1716    Clinical Impression Statement Pt. presents with decreased R groin pain and increased L leg pain. New HEP was provided and pt. demonstrated understanding. Hypomobility and pain in lumbar spine improved with repeated CPA's and UPAs. Distraction of pt.'s hips combined with neural glides reduced pain levels. Pt. flexibility improving on R, however continues to have room to progress with L. Pt. Will benefit from skilled physical therapy to improve lumbar ROM, reduce pain with ADLs, and increase tolerance with standing   Rehab Potential Fair   Clinical Impairments Affecting Rehab Potential positive: good caregiver support, motivated; negative: chronic pain;    PT Frequency 2x / week   PT Duration 6 weeks   PT Treatment/Interventions Aquatic Therapy;Cryotherapy;Electrical Stimulation;Moist Heat;Traction;Neuromuscular re-education;Balance training;Therapeutic exercise;Therapeutic activities;Functional mobility training;Patient/family education;Manual techniques;Energy conservation;Passive range of motion;Dry needling   PT Next Visit Plan piriformis, evaluate HEp compliance, mobilizations   PT Home Exercise Plan initiated- see patient instructions;    Consulted and Agree with Plan of Care Patient      Patient will benefit from skilled therapeutic intervention in order to improve the following deficits and impairments:  Hypomobility, Decreased activity tolerance, Increased fascial restricitons, Pain, Increased muscle spasms, Decreased mobility, Decreased range of motion, Postural dysfunction, Impaired flexibility  Visit Diagnosis: Chronic bilateral low back pain, with sciatica presence unspecified     Problem List Patient Active Problem List   Diagnosis Date Noted  . Family history of ovarian cancer 01/16/2017  . Essential hypertension 01/16/2017  . GERD  (gastroesophageal reflux disease) 01/16/2017  . Depression 12/28/2014  . Hyperlipidemia 12/28/2014   Precious Bard, PT, DPT   01/24/2017, 5:19 PM  [place] [facility] MAIN [facility] SERVICES [address] [place], [place], [address] Phone: [phone]   Fax:  [phone]  Name: Ashley [name] MRN: [id number] Date of Birth: [birth year]

## 2017-01-29 ENCOUNTER — Ambulatory Visit: Payer: Medicare HMO

## 2017-01-29 DIAGNOSIS — G8929 Other chronic pain: Secondary | ICD-10-CM | POA: Diagnosis not present

## 2017-01-29 DIAGNOSIS — M545 Low back pain: Secondary | ICD-10-CM | POA: Diagnosis not present

## 2017-01-29 NOTE — Therapy (Signed)
[facility] [address] [place], [place], [address] Phone: [phone]   Fax:  [phone]  Physical Therapy Treatment  Patient Details  Name: Ashley Riffleatricia A Wiesen MRN: [id number] Date of Birth: 1940/09/07 Referring Provider: Anastasia FiedlerBetty Marshall NP, Dr. Samule Dryoi  Encounter Date: 01/29/2017      PT End of Session - 01/29/17 1435    Visit Number 6   Number of Visits 13   Date for PT Re-Evaluation 02/14/17   Authorization Type gcode 6   Authorization Time Period 10   PT Start Time 1344   PT Stop Time 1430   PT Time Calculation (min) 46 min   Activity Tolerance Patient tolerated treatment well   Behavior During Therapy [facility] for tasks assessed/performed      Past Medical History:  Diagnosis Date  . Arthritis   . Chicken pox   . Depression   . Hyperlipidemia   . Hypertension     Past Surgical History:  Procedure Laterality Date  . SKIN LESION EXCISION    . TONSILLECTOMY AND ADENOIDECTOMY      There were no vitals filed for this visit.      Subjective Assessment - 01/29/17 1345    Subjective Pt. drove 4 hours yesterday with no breaks and is sore in her back today.   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 no pain with sitting; will get stiff with prolonged sitting;    How long can you stand comfortably? 2-3 min   How long can you walk comfortably? reports pain after walking about 5 min;    Diagnostic tests had MRI/CT scan of low back which shows arthritis and lumbar stenosis with disc bulge at L4-L5;    Patient Stated Goals to reduce back pain;    Currently in Pain? Yes   Pain Score 8    Pain Location Back   Pain Orientation Distal   Pain Descriptors / Indicators Aching   Pain Type Chronic pain   Pain Onset More than a month  ago      Manual CPAs UPAs grade I-III to lumbar spine 3x 30 seconds each level. , L more hypomobile than R,. Hamstring, popliteal angle, IR, and ER stretch supine 2x60 seconds each leg Manual distraction with belt in supine 8x30 second holds in varying planes, SAD and  LAD R and L hip mobilization grade I-III AP, 4x30 seconds, hypomobile, no increase in pain STM/Trigger point R and L piriformis 3 minutes   Neural glides supine -SLR with ankle pumps. Leg stabilized on PT shoulder. 10x glides each time.     TherEx Supine hip flexor stretch 2x 60 seconds Transfers in <>out of bed.  TA contraction  With swiss ball: 12x5 second holds TA contraction with swiss ball and UE swim x 10 each arm TA contraction marching 15x Supine hip flexor stretch.  Neural glides in supine: 90 90 5x with 5x df each leg Ankle pumps 20x        Pt. response to medical necessity: Pt. Will benefit from skilled physical therapy to improve lumbar ROM, reduce pain with ADLs, and increase tolerance with standing tasks         PT Long Term Goals - 01/03/17 1014      PT LONG TERM GOAL #1   Title Patient will be independent in home exercise  program to improve strength/mobility for better functional independence with ADLs.   Time 6   Period Weeks   Status New     PT LONG TERM GOAL #2   Title Patient will report a worst pain of 3/10 on VAS in    low back         to improve tolerance with ADLs and reduced symptoms with activities.    Time 6   Period Weeks   Status New     PT LONG TERM GOAL #3   Title  Patient will reduce modified Oswestry score to <30 as to demonstrate less disability with ADLs including improved sleeping tolerance, walking/sitting tolerance etc for better mobility with ADLs.    Time 6   Period Weeks   Status New     PT LONG TERM GOAL #4   Title Patient will be able to stand for >15 min without increase in back/LE pain to improve standing tolerance for grocery shopping and work tasks.     Time 6   Period Weeks   Status New               Plan - 01/29/17 1438    Clinical Impression Statement  Pt. presents with limited lumbar mobility and muscular tightness from recent stint of driving. Mobilization of spine and hips were hypomobile and more tender.  Distraction of pt.'s hips combined with neural glides reduced pain levels. Core activation improved with utilization of swiss ball however continues to be limited by L leg pain. Pt. Will benefit from skilled physical therapy to improve lumbar ROM, reduce pain with ADLs, and increase tolerance with standing   Rehab Potential Fair   Clinical Impairments Affecting Rehab Potential positive: good caregiver support, motivated; negative: chronic pain;    PT Frequency 2x / week   PT Duration 6 weeks   PT Treatment/Interventions Aquatic Therapy;Cryotherapy;Electrical Stimulation;Moist Heat;Traction;Neuromuscular re-education;Balance training;Therapeutic exercise;Therapeutic activities;Functional mobility training;Patient/family education;Manual techniques;Energy conservation;Passive range of motion;Dry needling   PT Next Visit Plan piriformis, evaluate HEp compliance, mobilizations   PT Home Exercise Plan initiated- see patient instructions;    Consulted and Agree with Plan of Care Patient      Patient will benefit from skilled therapeutic intervention in order to improve the following deficits and impairments:  Hypomobility, Decreased activity tolerance, Increased fascial restricitons, Pain, Increased muscle spasms, Decreased mobility, Decreased range of motion, Postural dysfunction, Impaired flexibility  Visit Diagnosis: Chronic bilateral low back pain, with sciatica presence unspecified     Problem List Patient Active Problem List   Diagnosis Date Noted  . Family history of ovarian cancer 01/16/2017  . Essential hypertension 01/16/2017  . GERD (gastroesophageal reflux disease) 01/16/2017  . Depression 12/28/2014  .  Hyperlipidemia 12/28/2014   Precious Bard, PT, DPT   01/29/2017, 2:39 PM  [place] [facility] MAIN [facility] SERVICES [address] [place], [place], [address] Phone: [phone]   Fax:  [phone]  Name: SHARHONDA ATWOOD MRN: [id number] Date of Birth: 1940-11-06

## 2017-01-31 ENCOUNTER — Ambulatory Visit: Payer: Medicare HMO

## 2017-01-31 DIAGNOSIS — G8929 Other chronic pain: Secondary | ICD-10-CM

## 2017-01-31 DIAGNOSIS — M545 Low back pain: Secondary | ICD-10-CM | POA: Diagnosis not present

## 2017-01-31 NOTE — Therapy (Signed)
[place] [facility] [address] [place], [place], [address] Phone: [phone]   Fax:  [phone]  Physical Therapy Treatment  Patient Details  Name: Ashley Riffleatricia A [name] MRN: [id number] Date of Birth: 09/06/1940 Referring Provider: Anastasia FiedlerBetty Marshall NP, Dr. Samule Dryoi  Encounter Date: 01/31/2017      PT End of Session - 01/31/17 1441    Visit Number 7   Number of Visits 13   Date for PT Re-Evaluation 02/14/17   Authorization Type gcode 7   Authorization Time Period 10   PT Start Time 1345   PT Stop Time 1430   PT Time Calculation (min) 45 min   Activity Tolerance Patient tolerated treatment well   Behavior During Therapy [facility] for tasks assessed/performed      Past Medical History:  Diagnosis Date  . Arthritis   . Chicken pox   . Depression   . Hyperlipidemia   . Hypertension     Past Surgical History:  Procedure Laterality Date  . SKIN LESION EXCISION    . TONSILLECTOMY AND ADENOIDECTOMY      There were no vitals filed for this visit.      Subjective Assessment - 01/31/17 1348    Subjective Pt. is feeling better but continues to have some Right lower back pain.    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 no pain with sitting; will get stiff with prolonged sitting;    How long can you stand comfortably? 2-3 min   How long can you walk comfortably? reports pain after walking about 5 min;    Diagnostic tests had MRI/CT scan of low back which shows arthritis and lumbar stenosis with disc bulge at L4-L5;    Patient Stated Goals to reduce back pain;    Currently in Pain? Yes   Pain Score 5    Pain Location Back   Pain Orientation Right   Pain Descriptors / Indicators Radiating   Pain Type Chronic pain   Pain Radiating Towards right  leg   Pain Onset More than a month ago     Manual CPAs UPAs grade I-III to lumbar spine 3x 30 seconds each level. , L more hypomobile than R,. Hamstring, popliteal angle, IR, and ER stretch supine 2x60 seconds each leg Manual distraction with belt in supine 8x30 second holds in varying planes, SAD and  LAD R and L hip mobilization grade I-III AP, 4x30 seconds, hypomobile, no increase in pain STM/Trigger point R and L piriformis 3 minutes   Neural glides supine -SLR with ankle pumps. Leg stabilized on PT shoulder. 10x glides each time.     TherEx Supine hip flexor stretch 2x 60 seconds Transfers in <>out of bed.  Prone press ups 8x  TA contraction  dead bugs 15x TA contraction with 5lb bar in UEs in bench press hold with marches x 10 TA contraction marching 15x Supine hip flexor stretch.  Seated TA contraction x 10 Seated knee to chest 2x 30 seconds Seated scapular retractions 10x Neural glides in supine: 90 90 5x with 5x df each leg Ankle pumps 20x         Pt. response to medical necessity: Pt. Will benefit from skilled physical therapy to improve lumbar ROM, reduce pain with ADLs, and increase tolerance with standing tasks   Patient sx returning to level prior  to long car trip. Radiating symptoms to right leg improved after manual. Exercises to perform while seated during next car ride were performed with pt. Demonstrating understanding. Pt. Will benefit from skilled physical therapy to improve lumbar ROM, reduce pain with ADLs, and increase tolerance with standing tasks        PT Long Term Goals - 01/03/17 1014      PT LONG TERM GOAL #1   Title Patient will be independent in home exercise program to improve strength/mobility for better functional independence with ADLs.   Time 6   Period Weeks   Status New     PT LONG TERM GOAL #2   Title Patient will report a worst pain of 3/10 on VAS in    low back         to improve tolerance with ADLs and reduced symptoms with  activities.    Time 6   Period Weeks   Status New     PT LONG TERM GOAL #3   Title  Patient will reduce modified Oswestry score to <30 as to demonstrate less disability with ADLs including improved sleeping tolerance, walking/sitting tolerance etc for better mobility with ADLs.    Time 6   Period Weeks   Status New     PT LONG TERM GOAL #4   Title Patient will be able to stand for >15 min without increase in back/LE pain to improve standing tolerance for grocery shopping and work tasks.    Time 6   Period Weeks   Status New               Plan - 01/31/17 1445    Clinical Impression Statement Patient sx returning to level prior to long car trip. Radiating symptoms to right leg improved after manual. Exercises to perform while seated during next car ride were performed with pt. Demonstrating understanding. Pt. Will benefit from skilled physical therapy to improve lumbar ROM, reduce pain with ADLs, and increase tolerance with standing tasks   Rehab Potential Fair   Clinical Impairments Affecting Rehab Potential positive: good caregiver support, motivated; negative: chronic pain;    PT Frequency 2x / week   PT Duration 6 weeks   PT Treatment/Interventions Aquatic Therapy;Cryotherapy;Electrical Stimulation;Moist Heat;Traction;Neuromuscular re-education;Balance training;Therapeutic exercise;Therapeutic activities;Functional mobility training;Patient/family education;Manual techniques;Energy conservation;Passive range of motion;Dry needling   PT Next Visit Plan car ride exercises   PT Home Exercise Plan initiated- see patient instructions;    Consulted and Agree with Plan of Care Patient      Patient will benefit from skilled therapeutic intervention in order to improve the following deficits and impairments:  Hypomobility, Decreased activity tolerance, Increased fascial restricitons, Pain, Increased muscle spasms, Decreased mobility, Decreased range of motion, Postural dysfunction,  Impaired flexibility  Visit Diagnosis: Chronic bilateral low back pain, with sciatica presence unspecified     Problem List Patient Active Problem List   Diagnosis Date Noted  . Family history of ovarian cancer 01/16/2017  . Essential hypertension 01/16/2017  . GERD (gastroesophageal reflux disease) 01/16/2017  . Depression 12/28/2014  . Hyperlipidemia 12/28/2014   Precious Bard, PT, DPT   01/31/2017, 2:46 PM  [place] [facility] MAIN [facility] SERVICES [address] [place], [place], [address] Phone: [phone]   Fax:  [phone]  Name: JESSI PITSTICK MRN: [id number] Date of Birth: Aug 04, 1941

## 2017-02-05 ENCOUNTER — Ambulatory Visit: Payer: Medicare HMO | Attending: Nurse Practitioner

## 2017-02-05 DIAGNOSIS — M545 Low back pain: Secondary | ICD-10-CM | POA: Diagnosis not present

## 2017-02-05 DIAGNOSIS — G8929 Other chronic pain: Secondary | ICD-10-CM | POA: Insufficient documentation

## 2017-02-05 NOTE — Therapy (Signed)
[name], [name], [address] Phone: [phone]   Fax:  [phone]  Physical Therapy Treatment  Patient Details  Name: Ashley [name] MRN: [id number] Date of Birth: Mar 24, 1941 Referring Provider: Jaynee Eagles NP, Dr. Galen Manila  Encounter Date: 02/05/2017      PT End of Session - 02/05/17 1750    Visit Number 8   Number of Visits 13   Date for PT Re-Evaluation 02/14/17   Authorization Type gcode 8   Authorization Time Period 10   PT Start Time 1515   PT Stop Time 1600   PT Time Calculation (min) 45 min   Activity Tolerance Patient tolerated treatment well   Behavior During Therapy [facility] for tasks assessed/performed      Past Medical History:  Diagnosis Date  . Arthritis   . Chicken pox   . Depression   . Hyperlipidemia   . Hypertension     Past Surgical History:  Procedure Laterality Date  . SKIN LESION EXCISION    . TONSILLECTOMY AND ADENOIDECTOMY      There were no vitals filed for this visit.      Subjective Assessment - 02/05/17 1518    Subjective Pt. relaxed yesterday and is ready to start becoming more independent with HEP.    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 no pain with sitting; will get stiff with prolonged sitting;    How long can you stand comfortably? 2-3 min   How long can you walk comfortably? reports pain after walking about 5 min;    Diagnostic tests had MRI/CT scan of low back which shows arthritis and lumbar stenosis with disc bulge at L4-L5;    Patient Stated Goals to reduce back pain;    Currently in Pain? Yes   Pain Score 2    Pain Location Back   Pain Orientation Right;Lower   Pain Descriptors / Indicators Aching   Pain Onset More than a month ago      Manual CPAs UPAs grade I-III to lumbar spine 3x 30 seconds each level. , L more hypomobile than R,. Hamstring, popliteal angle, IR, and ER stretch supine 2x60 seconds each leg Manual distraction with belt in supine 8x30 second holds in varying planes, SAD and  LAD R and L hip mobilization grade I-III AP, 4x30 seconds, hypomobile, no increase in pain STM/Trigger point R and L piriformis 3 minutes   Neural glides supine -SLR with ankle pumps. Leg stabilized on PT shoulder. 10x glides each time.     TherEx Scapular retractions seated 15x Single knee to chest seated 5x10 seconds Seated hamstring stretch 3x15 second holds Seated TA contraction 10x Seated ankle pump 20x  MODI=34%=mod disability Pt. response to medical necessity: Pt. Will benefit from skilled physical therapy to improve lumbar ROM, reduce pain with ADLs, and increase tolerance with standing tasks            PT Education - 02/05/17 1749    Education provided Yes   Education Details exercises to perform when seated for long durations. Transition to a more home based program .   Person(s) Educated Patient   Methods Explanation;Demonstration;Handout   Comprehension Verbalized understanding;Returned demonstration             PT Long Term Goals - 02/05/17 1751  PT LONG TERM GOAL #1   Title Patient will be independent in home exercise program to improve strength/mobility for better functional independence with ADLs.   Baseline new HEP given   Time 6   Period Weeks   Status On-going     PT LONG TERM GOAL #2   Title Patient will report a worst pain of 3/10 on VAS in    low back         to improve tolerance with ADLs and reduced symptoms with activities.    Baseline 2/10 pain   Time 6   Period Weeks   Status Achieved     PT LONG TERM GOAL #3   Title  Patient will reduce modified Oswestry score to <30 as to demonstrate less disability with ADLs including improved sleeping tolerance, walking/sitting  tolerance etc for better mobility with ADLs.    Baseline 34   Time 6   Period Weeks   Status On-going     PT LONG TERM GOAL #4   Title Patient will be able to stand for >15 min without increase in back/LE pain to improve standing tolerance for grocery shopping and work tasks.    Baseline met, able to perform >15 minutes of standing.    Time 6   Period Weeks   Status Achieved               Plan - 02/05/17 1751    Clinical Impression Statement Patient is progressing with HEP's and will assess over the next two weeks to determine need for continuation or if pt. Is ready for home based program. Exercises to perform while seated during next car ride were performed with pt. Demonstrating understanding. Pt. Will benefit from skilled physical therapy to improve lumbar ROM, reduce pain with ADLs, and increase tolerance with standing tasks   Rehab Potential Fair   Clinical Impairments Affecting Rehab Potential positive: good caregiver support, motivated; negative: chronic pain;    PT Frequency 2x / week   PT Duration 6 weeks   PT Treatment/Interventions Aquatic Therapy;Cryotherapy;Electrical Stimulation;Moist Heat;Traction;Neuromuscular re-education;Balance training;Therapeutic exercise;Therapeutic activities;Functional mobility training;Patient/family education;Manual techniques;Energy conservation;Passive range of motion;Dry needling   PT Next Visit Plan d/c?   PT Home Exercise Plan initiated- see patient instructions;    Consulted and Agree with Plan of Care Patient      Patient will benefit from skilled therapeutic intervention in order to improve the following deficits and impairments:  Hypomobility, Decreased activity tolerance, Increased fascial restricitons, Pain, Increased muscle spasms, Decreased mobility, Decreased range of motion, Postural dysfunction, Impaired flexibility  Visit Diagnosis: Chronic bilateral low back pain, with sciatica presence unspecified     Problem  List Patient Active Problem List   Diagnosis Date Noted  . Family history of ovarian cancer 01/16/2017  . Essential hypertension 01/16/2017  . GERD (gastroesophageal reflux disease) 01/16/2017  . Depression 12/28/2014  . Hyperlipidemia 12/28/2014  Janna Arch, PT, DPT    Janna Arch 02/05/2017, 5:56 PM  [place] MAIN [facility] SERVICES [address] [place], [name], [address] Phone: [phone]   Fax:  [phone]  Name: Ashley [name] MRN: [id number] Date of Birth: 1940-12-09

## 2017-02-12 ENCOUNTER — Ambulatory Visit: Payer: Medicare HMO

## 2017-02-14 ENCOUNTER — Ambulatory Visit: Payer: Medicare HMO

## 2017-02-16 DIAGNOSIS — N9489 Other specified conditions associated with female genital organs and menstrual cycle: Secondary | ICD-10-CM | POA: Diagnosis not present

## 2017-02-16 DIAGNOSIS — R938 Abnormal findings on diagnostic imaging of other specified body structures: Secondary | ICD-10-CM | POA: Diagnosis not present

## 2017-02-16 DIAGNOSIS — N840 Polyp of corpus uteri: Secondary | ICD-10-CM | POA: Diagnosis not present

## 2017-02-16 DIAGNOSIS — R102 Pelvic and perineal pain: Secondary | ICD-10-CM | POA: Diagnosis not present

## 2017-02-19 ENCOUNTER — Ambulatory Visit: Payer: Medicare HMO

## 2017-02-21 ENCOUNTER — Ambulatory Visit: Payer: Medicare HMO

## 2017-03-20 NOTE — H&P (Signed)
Ashley [name] is a 76 y.o. female here for Fractional D+C , H/S and Myosure resection of endometrial polyps .pmb + pelvic pain f/up  EMBX showed proliferative endometrium with evidence of polyp  SIS  showed 2 fibroids  And 1.4x 1.0 cm endometrial mass Ovaries appear nl  Past Medical History:  has a past medical history of AR (allergic rhinitis); Chicken pox; Depression, unspecified; GERD (gastroesophageal reflux disease); Hyperlipidemia, unspecified; Hypertriglyceridemia; and Psychiatric symptoms.  Past Surgical History:  has a past surgical history that includes Tonsillectomy; Excision of synovial cyst from the right AC joint; Normal vaginal delivery (1968); and Colonoscopy (10/18/2001, 10/12/2009). Family History: family history includes Aneurysm in her father; COPD in her sister; Cancer in her brother and sister; Deep vein thrombosis (DVT or abnormal blood clot formation) in her maternal grandfather, maternal grandmother, paternal grandfather, and paternal grandmother; Myocardial Infarction (Heart attack) in her sister; Ovarian cancer in her sister; Rheum arthritis in her mother. Social History:  reports that she has quit smoking. She has never used smokeless tobacco. She reports that she drinks alcohol. OB/GYN History:          OB History    Gravida Para Term Preterm AB Living   1 1 1     1    SAB TAB Ectopic Molar Multiple Live Births                    Allergies: is allergic to pravastatin and simvastatin. Medications:  Current Outpatient Prescriptions:  .  aspirin 81 MG EC tablet, Take 81 mg by mouth once daily., Disp: , Rfl:  .  cetirizine (ZYRTEC) 10 MG tablet, Take 10 mg by mouth once daily., Disp: , Rfl:  .  estradiol (ESTRACE) 1 MG tablet, 1-2 tabs po once a day, Disp: 60 tablet, Rfl: 11 .  gabapentin (NEURONTIN) 300 MG capsule, , Disp: , Rfl:  .  hydroCHLOROthiazide (HYDRODIURIL) 25 MG tablet, Take 1 tablet (25 mg total) by mouth once daily., Disp: 90 tablet, Rfl: 3 .   HYDROcodone-acetaminophen (NORCO) 5-325 mg tablet, 1/2-1 po bid prn, Disp: 40 tablet, Rfl: 0 .  ibuprofen (ADVIL,MOTRIN) 200 MG tablet, Take 400 mg by mouth every 6 (six) hours as needed for Pain., Disp: , Rfl:  .  omeprazole (PRILOSEC) 20 MG DR capsule, TAKE 1 CAPSULE BY MOUTH EVERY DAY, Disp: 90 capsule, Rfl: 3 .  progesterone (PROMETRIUM) 200 MG capsule, Take 1 capsule (200 mg total) by mouth nightly., Disp: 30 capsule, Rfl: 3 .  venlafaxine (EFFEXOR-XR) 150 MG XR capsule, TAKE 1 CAPSULE BY MOUTH EVERY DAY, Disp: 90 capsule, Rfl: 0  Review of Systems: General:                      No fatigue or weight loss Eyes:                           No vision changes Ears:                            No hearing difficulty Respiratory:                No cough or shortness of breath Pulmonary:                  No asthma or shortness of breath Cardiovascular:           No chest pain, palpitations,  dyspnea on exertion Gastrointestinal:          No abdominal bloating, chronic diarrhea, constipations, masses, pain or hematochezia Genitourinary:             No hematuria, dysuria, abnormal vaginal discharge, pelvic pain, Menometrorrhagia, + PMB  Lymphatic:                   No swollen lymph nodes Musculoskeletal:         No muscle weakness Neurologic:                  No extremity weakness, syncope, seizure disorder Psychiatric:                  No history of depression, delusions or suicidal/homicidal ideation    Exam:      Vitals:   03/20/17 1530  BP: (!) 164/91  Pulse: 96    Body mass index is 32.68 kg/m.  WDWN white/ female in NAD   Lungs: CTA  CV : RRR without murmur   Neck:  no thyromegaly Abdomen: soft , no mass, normal active bowel sounds,  non-tender, no rebound tenderness Pelvic: tanner stage 5 ,  External genitalia: vulva /labia no lesions Urethra: no prolapse Vagina: normal physiologic d/c Cervix: no lesions, no cervical motion tenderness   Uterus: normal size shape and  contour, non-tender Adnexa: no mass,  non-tender   Saline infusion sonohysterography: betadine prep to the cervix followed by placement of the HSG catheter into the endometrial canal . Sterile H2O is injected while performing a transvaginal u/s . Findings: 1.4 cm x 1.0 cm endometrial mass   Impression:   The primary encounter diagnosis was Endometrial mass. Diagnoses of Endometrial polyp, Thickened endometrium, and Pelvic pain in female were also pertinent to this visit. No etiology of pelvic pain identified on u/s . Probable pain from vertebra nerve root compression .    Plan:  Recommend SDS procedure Fx D+C and H/S and Myosure resection of endometrial mass Benefits and risks to surgery: fractional d+c , h/s and myosure resection of endometrial mass The proposed benefit of the surgery has been discussed with the patient. The possible risks include, but are not limited to: organ injury to the bowel , bladder, ureters, and major blood vessels and nerves. There is a possibility of additional surgeries resulting from these injuries. There is also the risk of blood transfusion and the need to receive blood products during or after the procedure which may rarely lead to HIV or Hepatitis C infection. There is a risk of developing a deep venous thrombosis or a pulmonary embolism . There is the possibility of wound infection and also anesthetic complications, even the rare possibility of death. The patient understands these risks and wishes to proceed. All questions have been answered and the consent has been signed.         Vilma Prader, MD

## 2017-03-21 ENCOUNTER — Inpatient Hospital Stay: Admission: RE | Admit: 2017-03-21 | Payer: Medicare HMO | Source: Ambulatory Visit

## 2017-03-27 ENCOUNTER — Encounter
Admission: RE | Admit: 2017-03-27 | Discharge: 2017-03-27 | Disposition: A | Discharge status: Home / Self Care | Payer: Medicare HMO | Source: Ambulatory Visit | Attending: Obstetrics and Gynecology | Admitting: Obstetrics and Gynecology

## 2017-03-27 DIAGNOSIS — Z79890 Hormone replacement therapy (postmenopausal): Secondary | ICD-10-CM | POA: Diagnosis not present

## 2017-03-27 DIAGNOSIS — F329 Major depressive disorder, single episode, unspecified: Secondary | ICD-10-CM | POA: Diagnosis not present

## 2017-03-27 DIAGNOSIS — Z7982 Long term (current) use of aspirin: Secondary | ICD-10-CM | POA: Diagnosis not present

## 2017-03-27 DIAGNOSIS — Z87891 Personal history of nicotine dependence: Secondary | ICD-10-CM | POA: Diagnosis not present

## 2017-03-27 DIAGNOSIS — R102 Pelvic and perineal pain: Secondary | ICD-10-CM | POA: Diagnosis not present

## 2017-03-27 DIAGNOSIS — N950 Postmenopausal bleeding: Secondary | ICD-10-CM | POA: Diagnosis not present

## 2017-03-27 DIAGNOSIS — N840 Polyp of corpus uteri: Secondary | ICD-10-CM

## 2017-03-27 DIAGNOSIS — Z01818 Encounter for other preprocedural examination: Secondary | ICD-10-CM | POA: Insufficient documentation

## 2017-03-27 DIAGNOSIS — N858 Other specified noninflammatory disorders of uterus: Secondary | ICD-10-CM | POA: Diagnosis present

## 2017-03-27 DIAGNOSIS — Z8041 Family history of malignant neoplasm of ovary: Secondary | ICD-10-CM | POA: Diagnosis not present

## 2017-03-27 DIAGNOSIS — R938 Abnormal findings on diagnostic imaging of other specified body structures: Secondary | ICD-10-CM | POA: Diagnosis not present

## 2017-03-27 DIAGNOSIS — E785 Hyperlipidemia, unspecified: Secondary | ICD-10-CM | POA: Diagnosis not present

## 2017-03-27 DIAGNOSIS — I10 Essential (primary) hypertension: Secondary | ICD-10-CM | POA: Diagnosis not present

## 2017-03-27 DIAGNOSIS — K219 Gastro-esophageal reflux disease without esophagitis: Secondary | ICD-10-CM | POA: Diagnosis not present

## 2017-03-27 DIAGNOSIS — Z79899 Other long term (current) drug therapy: Secondary | ICD-10-CM | POA: Diagnosis not present

## 2017-03-27 HISTORY — DX: Gastro-esophageal reflux disease without esophagitis: K21.9

## 2017-03-27 HISTORY — DX: Anxiety disorder, unspecified: F41.9

## 2017-03-27 LAB — POTASSIUM: Potassium: 4.4 mmol/L (ref 3.5–5.1)

## 2017-03-27 NOTE — Patient Instructions (Signed)
  Your procedure is scheduled on: March 30, 2017 (FRIDAY ) Report to [facility] Day Surgery 2nd floor medical mall (Medical Mall Entrance-take elevator on left to 2nd floor.  Check in with surgery information desk.) To find out your arrival time please call [phone] between 1PM - 3PM on March 29, 2017 (THURSDAY )   Remember: Instructions that are not followed completely may result in serious medical risk, up to and including death, or upon the discretion of your surgeon and anesthesiologist your surgery may need to be rescheduled.    _x___ 1. Do not eat food or drink liquids after midnight. No gum chewing or hard candies                                __x__ 2. No Alcohol for 24 hours before or after surgery.   __x__3. No Smoking for 24 prior to surgery.   ____  4. Bring all medications with you on the day of surgery if instructed.    __x__ 5. Notify your doctor if there is any change in your medical condition     (cold, fever, infections).     Do not wear jewelry, make-up, hairpins, clips or nail polish.  Do not wear lotions, powders, or perfumes. You may wear deodorant.  Do not shave 48 hours prior to surgery. Men may shave face and neck.  Do not bring valuables to the hospital.    [facility] is not responsible for any belongings or valuables.               Contacts, dentures or bridgework may not be worn into surgery.  Leave your suitcase in the car. After surgery it may be brought to your room.  For patients admitted to the hospital, discharge time is determined by your                       treatment team.   Patients discharged the day of surgery will not be allowed to drive home.  You will need someone to drive you home and stay with you the night of your procedure.    Please read over the following fact sheets that you were given:   [facility] Preparing for Surgery and or MRSA Information   TAKE THE FOLLOWING MEDICATIONS WITH A SIP OF WATER THE MORNING OF  SURGERY   1.GABAPENTIN  2. OMEPRAZOLE  3. VENLAFAXINE.    ____Fleets enema or Magnesium Citrate as directed.   ____ Use CHG Soap or sage wipes as directed on instruction sheet   ____ Use inhalers on the day of surgery and bring to hospital day of surgery  ____ Stop Metformin and Janumet 2 days prior to surgery.    ____ Take 1/2 of usual insulin dose the night before surgery and none on the morning surgery      _x___ Follow recommendations from Cardiologist, Pulmonologist or PCP regarding          stopping Aspirin, Coumadin, Plavix ,Eliquis, Effient, or Pradaxa, and Pletal. (STOP ASPIRIN NOW )  X____Stop Anti-inflammatories such as Advil, Aleve, Ibuprofen, Motrin, Naproxen, Naprosyn, Goodies powders or aspirin products. OK to take Tylenol  (STOP IBUPROFEN NOW )   _x___ Stop supplements until after surgery.  But may continue Vitamin D, Vitamin B,and multivitamin          ____ Bring C-Pap to the hospital.

## 2017-03-28 NOTE — Pre-Procedure Instructions (Signed)
EKG COMPARED WITH PREVIOUS AND OK

## 2017-03-29 MED ORDER — CEFOXITIN SODIUM-DEXTROSE 2-2.2 GM-% IV SOLR (PREMIX)
2.0000 g | INTRAVENOUS | Status: AC
  Administered 2017-03-30: 2 g via INTRAVENOUS

## 2017-03-30 ENCOUNTER — Ambulatory Visit: Payer: Medicare HMO | Admitting: Anesthesiology

## 2017-03-30 ENCOUNTER — Ambulatory Visit
Admission: RE | Admit: 2017-03-30 | Discharge: 2017-03-30 | Disposition: A | Discharge status: Home / Self Care | Payer: Medicare HMO | Source: Ambulatory Visit | Attending: Obstetrics and Gynecology | Admitting: Obstetrics and Gynecology

## 2017-03-30 ENCOUNTER — Encounter: Payer: Self-pay | Admitting: *Deleted

## 2017-03-30 ENCOUNTER — Encounter
Admission: RE | Disposition: A | Discharge status: Home / Self Care | Payer: Self-pay | Source: Ambulatory Visit | Attending: Obstetrics and Gynecology

## 2017-03-30 DIAGNOSIS — R102 Pelvic and perineal pain: Secondary | ICD-10-CM | POA: Insufficient documentation

## 2017-03-30 DIAGNOSIS — Z7982 Long term (current) use of aspirin: Secondary | ICD-10-CM | POA: Diagnosis not present

## 2017-03-30 DIAGNOSIS — N950 Postmenopausal bleeding: Secondary | ICD-10-CM | POA: Diagnosis not present

## 2017-03-30 DIAGNOSIS — I10 Essential (primary) hypertension: Secondary | ICD-10-CM | POA: Insufficient documentation

## 2017-03-30 DIAGNOSIS — Z79890 Hormone replacement therapy (postmenopausal): Secondary | ICD-10-CM | POA: Insufficient documentation

## 2017-03-30 DIAGNOSIS — Z87891 Personal history of nicotine dependence: Secondary | ICD-10-CM | POA: Insufficient documentation

## 2017-03-30 DIAGNOSIS — Z8041 Family history of malignant neoplasm of ovary: Secondary | ICD-10-CM | POA: Insufficient documentation

## 2017-03-30 DIAGNOSIS — K219 Gastro-esophageal reflux disease without esophagitis: Secondary | ICD-10-CM | POA: Insufficient documentation

## 2017-03-30 DIAGNOSIS — F329 Major depressive disorder, single episode, unspecified: Secondary | ICD-10-CM | POA: Insufficient documentation

## 2017-03-30 DIAGNOSIS — Z79899 Other long term (current) drug therapy: Secondary | ICD-10-CM | POA: Insufficient documentation

## 2017-03-30 DIAGNOSIS — N840 Polyp of corpus uteri: Secondary | ICD-10-CM | POA: Diagnosis not present

## 2017-03-30 DIAGNOSIS — R938 Abnormal findings on diagnostic imaging of other specified body structures: Secondary | ICD-10-CM | POA: Diagnosis not present

## 2017-03-30 HISTORY — PX: DILATATION & CURETTAGE/HYSTEROSCOPY WITH MYOSURE: SHX6511

## 2017-03-30 LAB — CBC
HCT: 45.3 % (ref 35.0–47.0)
Hemoglobin: 15.2 g/dL (ref 12.0–16.0)
MCH: 30.1 pg (ref 26.0–34.0)
MCHC: 33.7 g/dL (ref 32.0–36.0)
MCV: 89.5 fL (ref 80.0–100.0)
Platelets: 342 10*3/uL (ref 150–440)
RBC: 5.06 MIL/uL (ref 3.80–5.20)
RDW: 13.3 % (ref 11.5–14.5)
WBC: 8.7 10*3/uL (ref 3.6–11.0)

## 2017-03-30 LAB — BASIC METABOLIC PANEL
Anion gap: 10 (ref 5–15)
BUN: 16 mg/dL (ref 6–20)
CO2: 27 mmol/L (ref 22–32)
Calcium: 9.6 mg/dL (ref 8.9–10.3)
Chloride: 103 mmol/L (ref 101–111)
Creatinine, Ser: 0.76 mg/dL (ref 0.44–1.00)
GFR calc Af Amer: >60 mL/min (ref >60)
GFR calc non Af Amer: >60 mL/min (ref >60)
Glucose, Bld: 119 mg/dL — ABNORMAL HIGH (ref 65–99)
Potassium: 3.8 mmol/L (ref 3.5–5.1)
Sodium: 140 mmol/L (ref 135–145)

## 2017-03-30 LAB — TYPE AND SCREEN
ABO/RH(D): O POS
Antibody Screen: NEGATIVE

## 2017-03-30 SURGERY — DILATATION & CURETTAGE/HYSTEROSCOPY WITH MYOSURE
Anesthesia: General | Site: Uterus | Wound class: Clean Contaminated

## 2017-03-30 MED ORDER — LABETALOL HCL 5 MG/ML IV SOLN
INTRAVENOUS | Status: AC
  Administered 2017-03-30: 5 mg via INTRAVENOUS
  Filled 2017-03-30: qty 4

## 2017-03-30 MED ORDER — PROPOFOL 10 MG/ML IV BOLUS
INTRAVENOUS | Status: DC | PRN
  Administered 2017-03-30: 150 mg via INTRAVENOUS

## 2017-03-30 MED ORDER — SILVER NITRATE-POT NITRATE 75-25 % EX MISC
CUTANEOUS | Status: DC | PRN
  Administered 2017-03-30: 1

## 2017-03-30 MED ORDER — FENTANYL CITRATE (PF) 100 MCG/2ML IJ SOLN
INTRAMUSCULAR | Status: AC
  Filled 2017-03-30: qty 2

## 2017-03-30 MED ORDER — PROMETHAZINE HCL 25 MG/ML IJ SOLN: 6.2500 mg | INTRAMUSCULAR | Status: DC | PRN

## 2017-03-30 MED ORDER — OXYCODONE HCL 5 MG PO TABS
ORAL_TABLET | ORAL | Status: AC
  Filled 2017-03-30: qty 1

## 2017-03-30 MED ORDER — OXYCODONE HCL 5 MG PO TABS
5.0000 mg | ORAL_TABLET | Freq: Once | ORAL | Status: AC | PRN
  Administered 2017-03-30: 5 mg via ORAL

## 2017-03-30 MED ORDER — METOPROLOL TARTRATE 5 MG/5ML IV SOLN
INTRAVENOUS | Status: DC | PRN
  Administered 2017-03-30 (×2): 1 mg via INTRAVENOUS

## 2017-03-30 MED ORDER — LABETALOL HCL 5 MG/ML IV SOLN
5.0000 mg | Freq: Once | INTRAVENOUS | Status: AC
  Administered 2017-03-30: 5 mg via INTRAVENOUS

## 2017-03-30 MED ORDER — LACTATED RINGERS IV SOLN: INTRAVENOUS | Status: DC

## 2017-03-30 MED ORDER — MIDAZOLAM HCL 2 MG/2ML IJ SOLN
INTRAMUSCULAR | Status: DC | PRN
  Administered 2017-03-30: 1 mg via INTRAVENOUS

## 2017-03-30 MED ORDER — LIDOCAINE HCL (CARDIAC) 20 MG/ML IV SOLN
INTRAVENOUS | Status: DC | PRN
  Administered 2017-03-30: 40 mg via INTRAVENOUS

## 2017-03-30 MED ORDER — MIDAZOLAM HCL 2 MG/2ML IJ SOLN
INTRAMUSCULAR | Status: AC
  Filled 2017-03-30: qty 2

## 2017-03-30 MED ORDER — DEXAMETHASONE SODIUM PHOSPHATE 10 MG/ML IJ SOLN
INTRAMUSCULAR | Status: DC | PRN
  Administered 2017-03-30: 10 mg via INTRAVENOUS

## 2017-03-30 MED ORDER — ONDANSETRON HCL 4 MG/2ML IJ SOLN
INTRAMUSCULAR | Status: DC | PRN
  Administered 2017-03-30: 4 mg via INTRAVENOUS

## 2017-03-30 MED ORDER — PROPOFOL 10 MG/ML IV BOLUS
INTRAVENOUS | Status: AC
  Filled 2017-03-30: qty 20

## 2017-03-30 MED ORDER — CEFOXITIN SODIUM-DEXTROSE 2-2.2 GM-% IV SOLR (PREMIX)
INTRAVENOUS | Status: AC
  Filled 2017-03-30: qty 50

## 2017-03-30 MED ORDER — FENTANYL CITRATE (PF) 100 MCG/2ML IJ SOLN
25.0000 ug | INTRAMUSCULAR | Status: DC | PRN
  Administered 2017-03-30: 25 ug via INTRAVENOUS

## 2017-03-30 MED ORDER — LACTATED RINGERS IV SOLN
INTRAVENOUS | Status: DC
  Administered 2017-03-30 (×2): via INTRAVENOUS

## 2017-03-30 MED ORDER — FENTANYL CITRATE (PF) 100 MCG/2ML IJ SOLN
INTRAMUSCULAR | Status: DC | PRN
  Administered 2017-03-30: 25 ug via INTRAVENOUS
  Administered 2017-03-30: 50 ug via INTRAVENOUS

## 2017-03-30 MED ORDER — GLYCOPYRROLATE 0.2 MG/ML IJ SOLN
INTRAMUSCULAR | Status: DC | PRN
  Administered 2017-03-30: 0.6 mg via INTRAVENOUS

## 2017-03-30 MED ORDER — OXYCODONE HCL 5 MG/5ML PO SOLN: 5.0000 mg | Freq: Once | ORAL | Status: AC | PRN

## 2017-03-30 MED ORDER — MEPERIDINE HCL 50 MG/ML IJ SOLN: 6.2500 mg | INTRAMUSCULAR | Status: DC | PRN

## 2017-03-30 MED ORDER — GLYCOPYRROLATE 0.2 MG/ML IJ SOLN
INTRAMUSCULAR | Status: AC
  Filled 2017-03-30: qty 1

## 2017-03-30 SURGICAL SUPPLY — 19 items
ABLATOR ENDOMETRIAL MYOSURE (ABLATOR) ×2 IMPLANT
CANISTER SUC SOCK COL 7IN (MISCELLANEOUS) ×2 IMPLANT
CANISTER SUCT 3000ML PPV (MISCELLANEOUS) ×2 IMPLANT
CATH ROBINSON RED A/P 16FR (CATHETERS) ×2 IMPLANT
DEVICE MYOSURE LITE (MISCELLANEOUS) IMPLANT
GLOVE BIO SURGEON STRL SZ8 (GLOVE) ×4 IMPLANT
GOWN STRL REUS W/ TWL LRG LVL3 (GOWN DISPOSABLE) ×1 IMPLANT
GOWN STRL REUS W/ TWL XL LVL3 (GOWN DISPOSABLE) ×1 IMPLANT
GOWN STRL REUS W/TWL LRG LVL3 (GOWN DISPOSABLE) ×1
GOWN STRL REUS W/TWL XL LVL3 (GOWN DISPOSABLE) ×1
KIT RM TURNOVER CYSTO AR (KITS) ×2 IMPLANT
PACK DNC HYST (MISCELLANEOUS) ×2 IMPLANT
PAD OB MATERNITY 4.3X12.25 (PERSONAL CARE ITEMS) ×2 IMPLANT
PAD PREP 24X41 OB/GYN DISP (PERSONAL CARE ITEMS) ×2 IMPLANT
SOL .9 NS 3000ML IRR  AL (IV SOLUTION) ×1
SOL .9 NS 3000ML IRR UROMATIC (IV SOLUTION) ×1 IMPLANT
TOWEL OR 17X26 4PK STRL BLUE (TOWEL DISPOSABLE) ×2 IMPLANT
TUBING CONNECTING 10 (TUBING) ×2 IMPLANT
TUBING HYSTEROSCOPY DOLPHIN (MISCELLANEOUS) ×4 IMPLANT

## 2017-03-30 NOTE — Anesthesia Procedure Notes (Signed)
Procedure Name: LMA Insertion Date/Time: 03/30/2017 9:07 AM Performed by: Henrietta Hoover Pre-anesthesia Checklist: Patient identified, Emergency Drugs available, Suction available, Patient being monitored and Timeout performed Patient Re-evaluated:Patient Re-evaluated prior to induction Oxygen Delivery Method: Circle system utilized Preoxygenation: Pre-oxygenation with 100% oxygen Induction Type: IV induction Ventilation: Mask ventilation without difficulty LMA: LMA inserted LMA Size: 4.0 Number of attempts: 1 Placement Confirmation: ETT inserted through vocal cords under direct vision,  positive ETCO2 and breath sounds checked- equal and bilateral Tube secured with: Tape Dental Injury: Teeth and Oropharynx as per pre-operative assessment

## 2017-03-30 NOTE — Anesthesia Preprocedure Evaluation (Signed)
Anesthesia Evaluation  Patient identified by MRN, date of birth, ID band Patient awake    Reviewed: Allergy & Precautions, NPO status , Patient's Chart, lab work & pertinent test results  History of Anesthesia Complications Negative for: history of anesthetic complications  Airway Mallampati: II  TM Distance: >3 FB Neck ROM: Full    Dental no notable dental hx.    Pulmonary neg sleep apnea, neg COPD, former smoker,    breath sounds clear to auscultation- rhonchi (-) wheezing      Cardiovascular hypertension, Pt. on medications (-) CAD, (-) Past MI and (-) Cardiac Stents  Rhythm:Regular Rate:Normal - Systolic murmurs and - Diastolic murmurs    Neuro/Psych PSYCHIATRIC DISORDERS Anxiety Depression negative neurological ROS     GI/Hepatic Neg liver ROS, GERD  Controlled and Medicated,  Endo/Other  negative endocrine ROSneg diabetes  Renal/GU negative Renal ROS     Musculoskeletal  (+) Arthritis ,   Abdominal (+) + obese,   Peds  Hematology negative hematology ROS (+)   Anesthesia Other Findings Past Medical History: No date: Anxiety No date: Arthritis No date: Chicken pox No date: Depression No date: GERD (gastroesophageal reflux disease) No date: History of vertigo No date: Hyperlipidemia No date: Hypertension   Reproductive/Obstetrics                             Anesthesia Physical Anesthesia Plan  ASA: II  Anesthesia Plan: General   Post-op Pain Management:    Induction: Intravenous  PONV Risk Score and Plan: 2 and Ondansetron and Dexamethasone  Airway Management Planned: LMA  Additional Equipment:   Intra-op Plan:   Post-operative Plan:   Informed Consent: I have reviewed the patients History and Physical, chart, labs and discussed the procedure including the risks, benefits and alternatives for the proposed anesthesia with the patient or authorized representative who  has indicated his/her understanding and acceptance.   Dental advisory given  Plan Discussed with: CRNA and Anesthesiologist  Anesthesia Plan Comments:         Anesthesia Quick Evaluation

## 2017-03-30 NOTE — OR Nursing (Signed)
Patient up to br voided, patient desires discharge at this time

## 2017-03-30 NOTE — OR Nursing (Signed)
Dr Priscella Mann called for pain med in post op ordered to given oxycodone ordered for PACU

## 2017-03-30 NOTE — Brief Op Note (Signed)
03/30/2017  9:49 AM  PATIENT:  [name]  76 y.o. female  PRE-OPERATIVE DIAGNOSIS:  Endometrial Polyps  POST-OPERATIVE DIAGNOSIS:  Endometrial polyp   PROCEDURE:  Fractional Dilation and curettage  Myosure resection of endometrial polyp  SURGEON:  Surgeon(s) and Role:    * Schermerhorn, Ihor Austin, MD - Primary  PHYSICIAN ASSISTANT: Noralyn Pick   ASSISTANTS: none   ANESTHESIA:   LMA  EBL:  Total I/O In: 700 [I.V.:700] Out: 250 [Urine:250]  BLOOD ADMINISTERED:none  DRAINS: none   LOCAL MEDICATIONS USED:  NONE  SPECIMEN:  Source of Specimen:  ecc , endometrial polyp and curettings   DISPOSITION OF SPECIMEN:  PATHOLOGY  COUNTS:  YES  TOURNIQUET:  * No tourniquets in log *  DICTATION: .Other Dictation: Dictation Number verbal  PLAN OF CARE: Discharge to home after PACU  PATIENT DISPOSITION:  PACU - hemodynamically stable.   Delay start of Pharmacological VTE agent (>24hrs) due to surgical blood loss or risk of bleeding: not applicable

## 2017-03-30 NOTE — Discharge Instructions (Signed)
AMBULATORY SURGERY  DISCHARGE INSTRUCTIONS   1) The drugs that you were given will stay in your system until tomorrow so for the next 24 hours you should not:  A) Drive an automobile B) Make any legal decisions C) Drink any alcoholic beverage   2) You may resume regular meals tomorrow.  Today it is better to start with liquids and gradually work up to solid foods.  You may eat anything you prefer, but it is better to start with liquids, then soup and crackers, and gradually work up to solid foods.   3) Please notify your doctor immediately if you have any unusual bleeding, trouble breathing, redness and pain at the surgery site, drainage, fever, or pain not relieved by medication. 4)   5) Your post-operative visit with Dr.                                     is: Date:                        Time:    Please call to schedule your post-operative visit.  6) Additional Instructions: call for fever, heavy vaginal bleeding, any signs of urinary tract infection including burning or foul smelling urine. Call also for any abnormal vaginal discharge or any concerns you may have.

## 2017-03-30 NOTE — Anesthesia Postprocedure Evaluation (Signed)
Anesthesia Post Note  Patient: ALKA GUTTIERREZ  Procedure(s) Performed: Procedure(s) (LRB): DILATATION & CURETTAGE/HYSTEROSCOPY WITH MYOSURE (N/A)  Patient location during evaluation: PACU Anesthesia Type: General Level of consciousness: awake and alert and oriented Pain management: pain level controlled Vital Signs Assessment: post-procedure vital signs reviewed and stable Respiratory status: spontaneous breathing, nonlabored ventilation and respiratory function stable Cardiovascular status: blood pressure returned to baseline and stable Postop Assessment: no signs of nausea or vomiting Anesthetic complications: no     Last Vitals:  Vitals:   03/30/17 1049 03/30/17 1106  BP: (!) 166/90 (!) 167/82  Pulse: 83 80  Resp: 14 14  Temp:  36.8 C  SpO2: (!) 87% 99%    Last Pain:  Vitals:   03/30/17 1106  TempSrc: Temporal  PainSc: 5                  Amy Penwarden

## 2017-03-30 NOTE — Anesthesia Post-op Follow-up Note (Signed)
Anesthesia QCDR form completed.        

## 2017-03-30 NOTE — Op Note (Signed)
NAME:  Ashley [name], Ashley [name] NO.:  [id number]  MEDICAL RECORD NO.:  [id number]  LOCATION:                                 FACILITY:  PHYSICIAN:  Jennell Corner, MD     DATE OF BIRTH:  DATE OF PROCEDURE:  03/30/2017 DATE OF DISCHARGE:                              OPERATIVE REPORT   PREOPERATIVE DIAGNOSIS: 1. Postmenopausal bleeding. 2. Endometrial polyp.  POSTOPERATIVE DIAGNOSIS: 1. Postmenopausal bleeding. 2. Endometrial polyp.  PROCEDURE: 1. Fractional dilation and curettage. 2. MyoSure resection of endometrial polyp.  ANESTHESIA:  LMA.  SURGEON:  Jennell Corner, MD.  FIRST ASSISTANTVickii Penna, [name] student.  INDICATION:  A 76 year old female with postmenopausal bleeding.  The patient noted to have a 1.4 x 1.0 cm endometrial polyp on saline infusion sonohysterography.  DESCRIPTION OF PROCEDURE:  After adequate anesthesia, the patient was placed in dorsal supine position with the legs in the candy-cane stirrups.  A time-out was performed after the patient was sterilely prepped.  The patient did receive 2 g cefoxitin for surgical prophylaxis prior to commencement of the case.  Straight catheterization of the bladder yielded 250 mL urine.  Weighted speculum was placed in the posterior vaginal vault.  The anterior cervix was grasped with a single- tooth tenaculum and endocervical curettage was performed.  The uterus was then sounded to 7 cm and cervix was then dilated to #18 Hanks dilator without difficulty.  Hysteroscope was advanced into the endometrial cavity.  Normal saline used as distending medium.  There was noted to be a 1.5 x 1 cm anterior endometrial polyp in the endometrial cavity.  The rest of the cavity was atrophic.  Pictures were taken. MyoSure resector was brought up to the operative field and the polyp was resected without difficulty, 31 seconds of resection time.  Good hemostasis was noted.  The hysteroscope was then removed as well  as the MyoSure.  Endometrial curettage was then performed with scant additional tissue.  Silver nitrate was used on the tenacula sites to control hemostasis.  There were no complications.  Net deficit from normal saline is zero. Intraoperative fluids 400 mL.  Urine output 250 mL.  Bleeding minimal. The patient was taken to recovery room in good condition.          ______________________________ Jennell Corner, MD     TS/MEDQ  D:  03/30/2017  T:  03/30/2017  Job:  [id number]

## 2017-03-30 NOTE — Transfer of Care (Signed)
Immediate Anesthesia Transfer of Care Note  Patient: Ashley [name]  Procedure(s) Performed: Procedure(s): DILATATION & CURETTAGE/HYSTEROSCOPY WITH MYOSURE (N/A)  Patient Location: PACU  Anesthesia Type:General  Level of Consciousness: awake  Airway & Oxygen Therapy: Patient Spontanous Breathing and Patient connected to face mask oxygen  Post-op Assessment: Report given to RN and Post -op Vital signs reviewed and stable  Post vital signs: Reviewed and stable  Last Vitals:  Vitals:   03/30/17 0749 03/30/17 0959  BP: (!) 134/95 (!) 202/135  Pulse: 82 92  Resp: 18 18  Temp: 36.7 C (!) 36.4 C  SpO2: 96% 96%    Last Pain:  Vitals:   03/30/17 0749  TempSrc: Tympanic         Complications: No apparent anesthesia complications

## 2017-03-30 NOTE — Progress Notes (Signed)
Ready for surgery  Labs reviewed . NPO  All questions answered

## 2017-03-31 ENCOUNTER — Encounter: Payer: Self-pay | Admitting: Obstetrics and Gynecology

## 2017-04-02 LAB — SURGICAL PATHOLOGY

## 2017-04-23 ENCOUNTER — Telehealth: Payer: Self-pay | Admitting: Family Medicine

## 2017-04-23 NOTE — Telephone Encounter (Signed)
Pt called c/o chest pain, jaw pain, and left arm pain. Pt states that it has been on and off for a while. Transferred call to [facility] triage.   Call pt @ [phone]

## 2017-04-23 NOTE — Telephone Encounter (Signed)
Patient stated she was having these symptoms a week ago. Pt talked to [facility]. She has been scheduled for 04/24/2017 at 3:15pm

## 2017-04-24 ENCOUNTER — Ambulatory Visit (INDEPENDENT_AMBULATORY_CARE_PROVIDER_SITE_OTHER): Payer: Medicare HMO | Admitting: Family Medicine

## 2017-04-24 ENCOUNTER — Encounter: Payer: Self-pay | Admitting: Family Medicine

## 2017-04-24 VITALS — BP 126/78 | HR 91 | Temp 97.7°F | Resp 18 | Wt 151.4 lb

## 2017-04-24 DIAGNOSIS — R739 Hyperglycemia, unspecified: Secondary | ICD-10-CM

## 2017-04-24 DIAGNOSIS — R0789 Other chest pain: Secondary | ICD-10-CM

## 2017-04-24 DIAGNOSIS — E785 Hyperlipidemia, unspecified: Secondary | ICD-10-CM | POA: Diagnosis not present

## 2017-04-24 DIAGNOSIS — R6884 Jaw pain: Secondary | ICD-10-CM | POA: Diagnosis not present

## 2017-04-24 LAB — HEMOGLOBIN A1C: Hgb A1c MFr Bld: 5.9 % (ref 4.6–6.5)

## 2017-04-24 LAB — CBC
HCT: 45.4 % (ref 36.0–46.0)
Hemoglobin: 14.8 g/dL (ref 12.0–15.0)
MCHC: 32.7 g/dL (ref 30.0–36.0)
MCV: 90.9 fl (ref 78.0–100.0)
Platelets: 354 10*3/uL (ref 150.0–400.0)
RBC: 5 Mil/uL (ref 3.87–5.11)
RDW: 13.3 % (ref 11.5–15.5)
WBC: 8.1 10*3/uL (ref 4.0–10.5)

## 2017-04-24 LAB — COMPREHENSIVE METABOLIC PANEL
ALT: 13 U/L (ref 0–35)
AST: 15 U/L (ref 0–37)
Albumin: 4.1 g/dL (ref 3.5–5.2)
Alkaline Phosphatase: 45 U/L (ref 39–117)
BUN: 12 mg/dL (ref 6–23)
CO2: 28 mEq/L (ref 19–32)
Calcium: 9.4 mg/dL (ref 8.4–10.5)
Chloride: 99 mEq/L (ref 96–112)
Creatinine, Ser: 0.69 mg/dL (ref 0.40–1.20)
GFR: 88.4 mL/min (ref >60.00)
Glucose, Bld: 103 mg/dL — ABNORMAL HIGH (ref 70–99)
Potassium: 3.8 mEq/L (ref 3.5–5.1)
Sodium: 136 mEq/L (ref 135–145)
Total Bilirubin: 0.4 mg/dL (ref 0.2–1.2)
Total Protein: 6.7 g/dL (ref 6.0–8.3)

## 2017-04-24 LAB — LIPID PANEL
Cholesterol: 187 mg/dL (ref 0–200)
HDL: 66.8 mg/dL (ref >39.00)
NonHDL: 120.58
Total CHOL/HDL Ratio: 3
Triglycerides: 276 mg/dL — ABNORMAL HIGH (ref 0.0–149.0)
VLDL: 55.2 mg/dL — ABNORMAL HIGH (ref 0.0–40.0)

## 2017-04-24 LAB — TROPONIN I: TNIDX: 0 ug/l (ref 0.00–0.06)

## 2017-04-24 LAB — LDL CHOLESTEROL, DIRECT: Direct LDL: 101 mg/dL

## 2017-04-24 NOTE — Assessment & Plan Note (Signed)
New problem. Next line patient with recent atypical chest pain. EKG obtained today. Interpretation: Normal sinus rhythm with rate of 86. No ST or T-wave changes. Normal EKG. I doubt this is cardiac chest pain. However, given age and risk factors, sending to cardiology for stress. Labs today.

## 2017-04-24 NOTE — Progress Notes (Signed)
Subjective:  Patient ID: Ashley [name], female    DOB: 08-30-1940  Age: 76 y.o. MRN: [id number]  CC: Jaw pain, Chest pain, recent arm pain  HPI:  76 year old female with hypertension, GERD, hyperlipidemia presents with the above complaints.  Patient states that last week she was at the pulmonology office with her husband. She states that she developed bilateral jaw pain and some associated midline chest pain. Pain described as achy. Occurred while she was at rest. No reports of shortness of breath. Lasted for approximately 10 minutes and then resolved. She had no radiation of her chest pain at that time. No reports of diaphoresis. A few days ago she had an episode of left arm pain. She did not have any chest pain or shortness of breath at that time. Her symptoms have now resolved. However, she is concerned about her symptoms especially since her husband had something similar recently and had a heart attack.  Patient endorses compliance with her medications. Blood pressure well controlled.  Social Hx   Social History   Social History  . Marital status: Married    Spouse name: N/A  . Number of children: N/A  . Years of education: N/A   Social History Main Topics  . Smoking status: Former Smoker    Packs/day: 0.50    Years: 15.00    Types: Cigarettes    Quit date: 08/07/1976  . Smokeless tobacco: Never Used  . Alcohol use Yes     Comment: ocassionally  . Drug use: No  . Sexual activity: Not Asked   Other Topics Concern  . None   Social History Narrative  . None    Review of Systems  Constitutional: Negative.   HENT:       Jaw pain.  Respiratory: Negative for shortness of breath.   Cardiovascular: Positive for chest pain.  Musculoskeletal:       Left arm pain.   Objective:  BP 126/78 (BP Location: Left Arm, Patient Position: Sitting, Cuff Size: Normal)   Pulse 91   Temp 97.7 F (36.5 C) (Oral)   Resp 18   Wt 151 lb 6 oz (68.7 kg)   SpO2 95%   BMI 30.57 kg/m     BP/Weight 04/24/2017 03/30/2017 03/27/2017  Systolic BP 126 156 140  Diastolic BP 78 77 85  Wt. (Lbs) 151.38 - 150  BMI 30.57 - 30.3    Physical Exam  Constitutional: She is oriented to person, place, and time. She appears well-developed. No distress.  HENT:  Head: Normocephalic and atraumatic.  Eyes: Conjunctivae are normal. No scleral icterus.  Cardiovascular: Normal rate and regular rhythm.   No murmur heard. Pulmonary/Chest: Effort normal. She has no wheezes. She has no rales.  Abdominal: Soft. She exhibits no distension. There is no tenderness. There is no rebound and no guarding.  Neurological: She is alert and oriented to person, place, and time.  Psychiatric: She has a normal mood and affect.  Vitals reviewed.   Lab Results  Component Value Date   WBC 8.7 03/30/2017   HGB 15.2 03/30/2017   HCT 45.3 03/30/2017   PLT 342 03/30/2017   GLUCOSE 119 (H) 03/30/2017   NA 140 03/30/2017   K 3.8 03/30/2017   CL 103 03/30/2017   CREATININE 0.76 03/30/2017   BUN 16 03/30/2017   CO2 27 03/30/2017    Assessment & Plan:   Problem List Items Addressed This Visit    Atypical chest pain - Primary    New  problem. Next line patient with recent atypical chest pain. EKG obtained today. Interpretation: Normal sinus rhythm with rate of 86. No ST or T-wave changes. Normal EKG. I doubt this is cardiac chest pain. However, given age and risk factors, sending to cardiology for stress. Labs today.      Relevant Orders   EKG 12-Lead (Completed)   CBC   Comprehensive metabolic panel   Troponin I (Completed)   Ambulatory referral to Cardiology   Hyperlipidemia   Relevant Orders   Lipid panel   Jaw pain    I'm not sure if this was associated with her chest pain.  Bilateral. Patient's husband says that she clenches her jaw. This is the likely culprit.       Other Visit Diagnoses    Blood glucose elevated       Relevant Orders   Hemoglobin A1c     Follow-up: Pending results;  going to see cardiology.   Everlene Other DO [facility]

## 2017-04-24 NOTE — Patient Instructions (Signed)
We will call with the cardiology referral & with your results.  Take care  Dr. Adriana Simas

## 2017-04-24 NOTE — Assessment & Plan Note (Signed)
I'm not sure if this was associated with her chest pain.  Bilateral. Patient's husband says that she clenches her jaw. This is the likely culprit.

## 2017-04-26 ENCOUNTER — Other Ambulatory Visit: Payer: Self-pay | Admitting: Family Medicine

## 2017-04-26 MED ORDER — ROSUVASTATIN CALCIUM 20 MG PO TABS: 20.0000 mg | ORAL_TABLET | Freq: Every day | ORAL | 3 refills | Status: DC

## 2017-05-17 DIAGNOSIS — R69 Illness, unspecified: Secondary | ICD-10-CM | POA: Diagnosis not present

## 2017-06-13 DIAGNOSIS — Z124 Encounter for screening for malignant neoplasm of cervix: Secondary | ICD-10-CM | POA: Diagnosis not present

## 2017-06-13 DIAGNOSIS — Z1211 Encounter for screening for malignant neoplasm of colon: Secondary | ICD-10-CM | POA: Diagnosis not present

## 2017-06-25 ENCOUNTER — Ambulatory Visit: Payer: Medicare HMO | Admitting: Family Medicine

## 2017-06-25 ENCOUNTER — Encounter: Payer: Self-pay | Admitting: Family Medicine

## 2017-06-25 VITALS — BP 144/80 | HR 82 | Temp 98.4°F | Ht 59.0 in | Wt 150.0 lb

## 2017-06-25 DIAGNOSIS — M542 Cervicalgia: Secondary | ICD-10-CM

## 2017-06-25 MED ORDER — PREDNISONE 10 MG PO TABS: ORAL_TABLET | ORAL | 0 refills | Status: DC

## 2017-06-25 MED ORDER — TIZANIDINE HCL 2 MG PO TABS: 2.0000 mg | ORAL_TABLET | Freq: Three times a day (TID) | ORAL | 0 refills | Status: DC

## 2017-06-25 NOTE — Progress Notes (Addendum)
Subjective:    Patient ID: Ashley [name], female    DOB: 1941-05-29, 76 y.o.   MRN: [id number]  HPI  Ashley [name] is a 76 year old female who presents today with neck pain that started 3 days ago.   Pain is rated as an 8 with movement. No pain is noted if remaining still. Described as an ache. 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  Treatment at home with hot/cold packs and ice packs and moist heat have provided moderate benefit. Aggravating factor is movement; alleviating factor is rest She denies fever, chills, sweats, trauma, visual changes, N/V, radiating pain, chest pain, jaw pain, numbness, tingling, weakness, bowel/bladder dysfunction, or gait abnormalities   Review of Systems  Constitutional: Negative for chills, fatigue and fever.  Eyes: Negative for visual disturbance.  Respiratory: Negative for cough, shortness of breath and wheezing.   Cardiovascular: Negative for chest pain and palpitations.  Gastrointestinal: Negative for abdominal pain, diarrhea, nausea and vomiting.  Musculoskeletal: Positive for neck pain.  Skin: Negative for rash.  Neurological: Negative for dizziness, weakness, light-headedness, numbness and headaches.  Psychiatric/Behavioral:       Denies depressed or anxious mood today   Past Medical History:  Diagnosis Date  . Anxiety   . Arthritis   . Chicken pox   . Depression   . GERD (gastroesophageal reflux disease)   . History of vertigo   . Hyperlipidemia   . Hypertension      Social History   Socioeconomic History  . Marital status: Married    Spouse name: Not on file  . Number of children: Not on file  . Years of education: Not on file  . Highest education level: Not on file  Social Needs  . Financial resource strain: Not on file  . Food insecurity - worry: Not on file  . Food insecurity - inability: Not on file  . Transportation needs - medical: Not on file  .  Transportation needs - non-medical: Not on file  Occupational History  . Not on file  Tobacco Use  . Smoking status: Former Smoker    Packs/day: 0.50    Years: 15.00    Pack years: 7.50    Types: Cigarettes    Last attempt to quit: 08/07/1976    Years since quitting: 40.9  . Smokeless tobacco: Never Used  Substance and Sexual Activity  . Alcohol use: Yes    Comment: ocassionally  . Drug use: No  . Sexual activity: Not on file  Other Topics Concern  . Not on file  Social History Narrative  . Not on file    Past Surgical History:  Procedure Laterality Date  . DILATATION & CURETTAGE/HYSTEROSCOPY WITH MYOSURE N/A 03/30/2017   Performed by Schermerhorn, Gwen Her, MD at [facility] ORS  . EYE SURGERY Bilateral    Cataract Extraction with IOL  . SKIN LESION EXCISION    . TONSILLECTOMY AND ADENOIDECTOMY     Age 72    Family History  Problem Relation Age of Onset  . Arthritis Mother   . Ovarian cancer Sister   . Heart disease Father     Allergies  Allergen Reactions  . Ace Inhibitors     Angioedema  . Pravastatin Other (See Comments)    Other reaction(s): Muscle Pain  . Simvastatin Other (See Comments)    stiffness    Current Outpatient Medications on File Prior to Visit  Medication  Sig Dispense Refill  . aspirin EC 81 MG tablet Take 81 mg by mouth daily.    . cetirizine (ZYRTEC) 10 MG tablet Take 10 mg by mouth daily.     Marland Kitchen estradiol (ESTRACE) 1 MG tablet take 2 tablets by mouth every morning    . hydrochlorothiazide (HYDRODIURIL) 25 MG tablet Take 1 tablet (25 mg total) by mouth daily. 30 tablet 1  . ibuprofen (ADVIL,MOTRIN) 200 MG tablet Take 400 mg by mouth 2 (two) times daily.     Marland Kitchen omeprazole (PRILOSEC) 20 MG capsule TAKE 1 CAPSULE BY MOUTH EVERY NIGHT    . progesterone (PROMETRIUM) 200 MG capsule Take 200 mg by mouth at bedtime.    . rosuvastatin (CRESTOR) 20 MG tablet Take 1 tablet (20 mg total) by mouth daily. 90 tablet 3  . venlafaxine XR (EFFEXOR-XR) 150 MG 24 hr  capsule Take 150 mg by mouth daily with breakfast.      No current facility-administered medications on file prior to visit.     BP (!) 144/80   Pulse 82   Temp 98.4 F (36.9 C)   Ht _0  (1.499 m)   Wt 150 lb (68 kg)   SpO2 97%   BMI 30.30 kg/m       Objective:   Physical Exam  Constitutional: She is oriented to person, place, and time. She appears well-developed and well-nourished.  Eyes: Pupils are equal, round, and reactive to light. No scleral icterus.  Neck: Neck supple.  Cardiovascular: Normal rate, regular rhythm and intact distal pulses.  Pulmonary/Chest: Effort normal and breath sounds normal. She has no wheezes. She has no rales.  Abdominal: Soft. Bowel sounds are normal. There is no tenderness.  Musculoskeletal: She exhibits no edema.  Tenderness to palpation of neck and trapezius (right side). Able to complete full passive ROM with mild discomfort noted.  Negative Lhermitte's sign and negative Spurling maneuver  Lymphadenopathy:    She has no cervical adenopathy.  Neurological: She is alert and oriented to person, place, and time. She has normal strength. Coordination and gait normal.  II-Visual fields grossly intact. III/IV/VI-Extraocular movements intact. Pupils reactive bilaterally. V/VII-Smile symmetric, equal eyebrow raise, facial sensation intact VIII- Hearing grossly intact XI-bilateral shoulder shrug XII-midline tongue extension Motor: 5/5 bilaterally with normal tone and bulk Ambulates with a coordinated gait   Skin: Skin is warm. No pallor.  Psychiatric: She has a normal mood and affect. Her behavior is normal. Judgment and thought content normal.          Assessment & Plan:  1. Neck pain Neuro exam is normal; exam is reassuring. No red flags noted on exam. Suspect that symptom is most likely muscular in nature due to increased computer time and poor body mechanics. Conservative therapy as initial treatment of a short course of prednisone  and muscle relaxer with use of heat for symptoms. We discussed that if symptoms do not improve with treatment and initiating breaks at computer; imaging and lab work of CBC, ESR, and CRP can be considered.  Advised follow up if symptoms do not improve with treatment, worsen, or she develops any new symptoms such as fever, stiff neck, or weakness.  Patient will follow up with PCP for routine care or sooner if needed.  Delano Metz, FNP-C

## 2017-06-25 NOTE — Patient Instructions (Addendum)
It was a pleasure to see you today.  -please take medication as directed with food.  -follow up if symptoms do not improve with treatment, worsen, or new symptoms develop.   Cervical Radiculopathy Cervical radiculopathy means that a nerve in the neck is pinched or bruised. This can cause pain or loss of feeling (numbness) that runs from your neck to your arm and fingers. Follow these instructions at home: Managing pain  Take over-the-counter and prescription medicines only as told by your doctor.  If directed, put ice on the injured or painful area. ? Put ice in a plastic bag. ? Place a towel between your skin and the bag. ? Leave the ice on for 20 minutes, 2-3 times per day.  If ice does not help, you can try using heat. Take a warm shower or warm bath, or use a heat pack as told by your doctor.  You may try a gentle neck and shoulder massage. Activity  Rest as needed. Follow instructions from your doctor about any activities to avoid.  Do exercises as told by your doctor or physical therapist. General instructions  If you were given a soft collar, wear it as told by your doctor.  Use a flat pillow when you sleep.  Keep all follow-up visits as told by your doctor. This is important. Contact a doctor if:  Your condition does not improve with treatment. Get help right away if:  Your pain gets worse and is not controlled with medicine.  You lose feeling or feel weak in your hand, arm, face, or leg.  You have a fever.  You have a stiff neck.  You cannot control when you poop or pee (have incontinence).  You have trouble with walking, balance, or talking. This information is not intended to replace advice given to you by your health care provider. Make sure you discuss any questions you have with your health care provider. Document Released: 07/13/2011 Document Revised: 12/30/2015 Document Reviewed: 09/17/2014 Elsevier Interactive Patient Education  [employer]2018 Elsevier  Inc.

## 2017-07-02 DIAGNOSIS — Z1231 Encounter for screening mammogram for malignant neoplasm of breast: Secondary | ICD-10-CM | POA: Diagnosis not present

## 2017-07-03 DIAGNOSIS — N950 Postmenopausal bleeding: Secondary | ICD-10-CM | POA: Diagnosis not present

## 2017-07-05 ENCOUNTER — Telehealth: Payer: Self-pay

## 2017-07-05 NOTE — Telephone Encounter (Signed)
Agree with reevaluation.  If she develops numbness or weakness or significantly worsening pain she should be evaluated sooner.  Thanks.

## 2017-07-05 NOTE — Telephone Encounter (Signed)
Patient called saying that she is still having neck pain, mostly right side. Was seen on November 19 and was given prednisone taper and tizanidine. She has been alternating ice/hot packs and also taking tylenol and using icyhot. I have scheduled patient on December 3 with PCP.  Copied from CRM [id number]. Topic: Inquiry >> Jul 05, 2017 10:17 AM Everardo PacificMoton, [place], [place] wrote: Reason for CRM: Patient is still having neck pain she was seen by Kordsmeier last week. Patient wanted to know if there was something that she might think she should do about the pain. Patient does have an appointment to see Dr.Sonnenberg on 12/12 /2018. If someone could give her a call back about this.

## 2017-07-09 ENCOUNTER — Ambulatory Visit (INDEPENDENT_AMBULATORY_CARE_PROVIDER_SITE_OTHER): Payer: Medicare HMO

## 2017-07-09 ENCOUNTER — Other Ambulatory Visit: Payer: Self-pay

## 2017-07-09 ENCOUNTER — Ambulatory Visit: Payer: Medicare HMO | Admitting: Family Medicine

## 2017-07-09 ENCOUNTER — Encounter: Payer: Self-pay | Admitting: Family Medicine

## 2017-07-09 VITALS — BP 128/80 | HR 82 | Temp 97.5°F | Wt 153.6 lb

## 2017-07-09 DIAGNOSIS — N950 Postmenopausal bleeding: Secondary | ICD-10-CM | POA: Insufficient documentation

## 2017-07-09 DIAGNOSIS — K219 Gastro-esophageal reflux disease without esophagitis: Secondary | ICD-10-CM

## 2017-07-09 DIAGNOSIS — M542 Cervicalgia: Secondary | ICD-10-CM | POA: Diagnosis not present

## 2017-07-09 DIAGNOSIS — I10 Essential (primary) hypertension: Secondary | ICD-10-CM

## 2017-07-09 DIAGNOSIS — M4802 Spinal stenosis, cervical region: Secondary | ICD-10-CM | POA: Diagnosis not present

## 2017-07-09 MED ORDER — OMEPRAZOLE 20 MG PO CPDR: 20.0000 mg | DELAYED_RELEASE_CAPSULE | ORAL | 0 refills | Status: DC

## 2017-07-09 NOTE — Progress Notes (Signed)
Marikay AlarEric Sonnenberg, MD Phone: [phone]  Ashley [name] is a 76 y.o. female who presents today for follow-up.    Neck pain: Previously seen by nurse practitioner.  Started on prednisone and a muscle relaxer.  Notes it got a little better though when she came off of the prednisone it got worse again.  No radiation.  No numbness.  No weakness.  Possibly has gotten a little worse.  It is in her left trapezius.  No injury.  She has been sitting at the computer more.  Hypertension: Not checking her blood pressure at home.  Does take her medications.  No chest pain or shortness of breath.  Postmenopausal bleeding: Following with gynecology.  They have her on progesterone and estrogen supplement.  GERD: Taking omeprazole.  No symptoms while taking this.  Has been on it for years.  No blood in her stool.  Social History   Tobacco Use  Smoking Status Former Smoker  . Packs/day: 0.50  . Years: 15.00  . Pack years: 7.50  . Types: Cigarettes  . Last attempt to quit: 08/07/1976  . Years since quitting: 40.9  Smokeless Tobacco Never Used     ROS see history of present illness  Objective  Physical Exam Vitals:   07/09/17 1454  BP: 128/80  Pulse: 82  Temp: (!) 97.5 F (36.4 C)  SpO2: 95%    BP Readings from Last 3 Encounters:  07/09/17 128/80  06/25/17 (!) 144/80  04/24/17 126/78   Wt Readings from Last 3 Encounters:  07/09/17 153 lb 9.6 oz (69.7 kg)  06/25/17 150 lb (68 kg)  04/24/17 151 lb 6 oz (68.7 kg)    Physical Exam  Constitutional: No distress.  Cardiovascular: Normal rate, regular rhythm and normal heart sounds.  Pulmonary/Chest: Effort normal and breath sounds normal.  Musculoskeletal: She exhibits no edema.  No midline neck tenderness, no midline neck step-off, tenderness over the left trapezius muscle midportion, full range of motion neck, negative Spurling's  Neurological: She is alert.  5/5 strength in bilateral biceps, triceps, grip, quads, hamstrings,  plantar and dorsiflexion, sensation to light touch intact in bilateral UE and LE  Skin: She is not diaphoretic.     Assessment/Plan: Please see individual problem list.  Post-menopausal bleeding Following with GYN.  She will continue to follow with them.  Essential hypertension Controlled.  Continue current regimen.  GERD (gastroesophageal reflux disease) Chronically on omeprazole.  Discussed potential risks of being on this medicine long-term.  We will switch her to every other day for a month and then she will try to come off.  Advised if she has any symptoms with this she will go back to daily omeprazole.  Neck pain Suspect muscular strain of left trapezius though potentially could have arthritis or nerve impingement playing a role.  We will obtain an x-ray of her cervical spine.  She is provided with exercises.  She will continue the muscle relaxer.  Consider referral to physical therapy or MRI if not improving.   Ashley [name] was seen today for neck pain.  Diagnoses and all orders for this visit:  Neck pain -     DG Cervical Spine Complete; Future  Post-menopausal bleeding  Essential hypertension  Gastroesophageal reflux disease without esophagitis  Other orders -     omeprazole (PRILOSEC) 20 MG capsule; Take 1 capsule (20 mg total) by mouth every other day.    Orders Placed This Encounter  Procedures  . DG Cervical Spine Complete    Standing Status:  Future    Number of Occurrences:   1    Standing Expiration Date:   09/09/2018    Order Specific Question:   Reason for Exam (SYMPTOM  OR DIAGNOSIS REQUIRED)    Answer:   neck pain, no injury, not improving    Order Specific Question:   Preferred imaging location?    Answer:   [facility]    Order Specific Question:   Radiology Contrast Protocol - do NOT remove file path    Answer:   [email or web address]    Meds ordered this encounter  Medications  .  omeprazole (PRILOSEC) 20 MG capsule    Sig: Take 1 capsule (20 mg total) by mouth every other day.    Dispense:  15 capsule    Refill:  0     Marikay AlarEric Sonnenberg, MD Outpatient Plastic Surgery [facility]

## 2017-07-09 NOTE — Patient Instructions (Addendum)
Nice to see you. We will get an x-ray of your neck. We will have you take omeprazole every other day for 1 month and then discontinue.  If your symptoms of reflux return please restart on the omeprazole. Please do the exercises for your neck. You can continue the muscle relaxer and if you need a refill please let us know.   Cervical Strain and Sprain Rehab Ask your health care provider which exercises are safe for you. Do exercises exactly as told by your health care provider and adjust them as directed. It is normal to feel mild stretching, pulling, tightness, or discomfort as you do these exercises, but you should stop right away if you feel sudden pain or your pain gets worse.Do not begin these exercises until told by your health care provider. Stretching and range of motion exercises These exercises warm up your muscles and joints and improve the movement and flexibility of your neck. These exercises also help to relieve pain, numbness, and tingling. Exercise A: Cervical side bend  1. Using good posture, sit on a stable chair or stand up. 2. Without moving your shoulders, slowly tilt your left / right ear to your shoulder until you feel a stretch in your neck muscles. You should be looking straight ahead. 3. Hold for __________ seconds. 4. Repeat with the other side of your neck. Repeat __________ times. Complete this exercise __________ times a day. Exercise B: Cervical rotation  1. Using good posture, sit on a stable chair or stand up. 2. Slowly turn your head to the side as if you are looking over your left / right shoulder. ? Keep your eyes level with the ground. ? Stop when you feel a stretch along the side and the back of your neck. 3. Hold for __________ seconds. 4. Repeat this by turning to your other side. Repeat __________ times. Complete this exercise __________ times a day. Exercise C: Thoracic extension and pectoral stretch 1. Roll a towel or a small blanket so it is  about 4 inches (10 cm) in diameter. 2. Lie down on your back on a firm surface. 3. Put the towel lengthwise, under your spine in the middle of your back. It should not be not under your shoulder blades. The towel should line up with your spine from your middle back to your lower back. 4. Put your hands behind your head and let your elbows fall out to your sides. 5. Hold for __________ seconds. Repeat __________ times. Complete this exercise __________ times a day. Strengthening exercises These exercises build strength and endurance in your neck. Endurance is the ability to use your muscles for a long time, even after your muscles get tired. Exercise D: Upper cervical flexion, isometric 1. Lie on your back with a thin pillow behind your head and a small rolled-up towel under your neck. 2. Gently tuck your chin toward your chest and nod your head down to look toward your feet. Do not lift your head off the pillow. 3. Hold for __________ seconds. 4. Release the tension slowly. Relax your neck muscles completely before you repeat this exercise. Repeat __________ times. Complete this exercise __________ times a day. Exercise E: Cervical extension, isometric  1. Stand about 6 inches (15 cm) away from a wall, with your back facing the wall. 2. Place a soft object, about 6-8 inches (15-20 cm) in diameter, between the back of your head and the wall. A soft object could be a small pillow, a ball, or a  folded towel. 3. Gently tilt your head back and press into the soft object. Keep your jaw and forehead relaxed. 4. Hold for __________ seconds. 5. Release the tension slowly. Relax your neck muscles completely before you repeat this exercise. Repeat __________ times. Complete this exercise __________ times a day. Posture and body mechanics  Body mechanics refers to the movements and positions of your body while you do your daily activities. Posture is part of body mechanics. Good posture and healthy body  mechanics can help to relieve stress in your body's tissues and joints. Good posture means that your spine is in its natural S-curve position (your spine is neutral), your shoulders are pulled back slightly, and your head is not tipped forward. The following are general guidelines for applying improved posture and body mechanics to your everyday activities. Standing  When standing, keep your spine neutral and keep your feet about hip-width apart. Keep a slight bend in your knees. Your ears, shoulders, and hips should line up.  When you do a task in which you stand in one place for a long time, place one foot up on a stable object that is 2-4 inches (5-10 cm) high, such as a footstool. This helps keep your spine neutral. Sitting   When sitting, keep your spine neutral and your keep feet flat on the floor. Use a footrest, if necessary, and keep your thighs parallel to the floor. Avoid rounding your shoulders, and avoid tilting your head forward.  When working at a desk or a computer, keep your desk at a height where your hands are slightly lower than your elbows. Slide your chair under your desk so you are close enough to maintain good posture.  When working at a computer, place your monitor at a height where you are looking straight ahead and you do not have to tilt your head forward or downward to look at the screen. Resting When lying down and resting, avoid positions that are most painful for you. Try to support your neck in a neutral position. You can use a contour pillow or a small rolled-up towel. Your pillow should support your neck but not push on it. This information is not intended to replace advice given to you by your health care provider. Make sure you discuss any questions you have with your health care provider. Document Released: 07/24/2005 Document Revised: 03/30/2016 Document Reviewed: 06/30/2015 Elsevier Interactive Patient Education  [employer]2018 Elsevier Inc.

## 2017-07-09 NOTE — Assessment & Plan Note (Signed)
Following with GYN.  She will continue to follow with them.

## 2017-07-09 NOTE — Assessment & Plan Note (Signed)
Controlled. Continue current regimen. 

## 2017-07-09 NOTE — Assessment & Plan Note (Signed)
Chronically on omeprazole.  Discussed potential risks of being on this medicine long-term.  We will switch her to every other day for a month and then she will try to come off.  Advised if she has any symptoms with this she will go back to daily omeprazole.

## 2017-07-09 NOTE — Assessment & Plan Note (Signed)
Suspect muscular strain of left trapezius though potentially could have arthritis or nerve impingement playing a role.  We will obtain an x-ray of her cervical spine.  She is provided with exercises.  She will continue the muscle relaxer.  Consider referral to physical therapy or MRI if not improving.

## 2017-07-11 ENCOUNTER — Telehealth: Payer: Self-pay

## 2017-07-11 DIAGNOSIS — M48062 Spinal stenosis, lumbar region with neurogenic claudication: Secondary | ICD-10-CM | POA: Diagnosis not present

## 2017-07-11 NOTE — Telephone Encounter (Signed)
Ashley SalivaBrittany Wiggins informed patient of xray results after this CRM was created.

## 2017-07-11 NOTE — Telephone Encounter (Signed)
Copied from CRM [id number]. Topic: Quick Communication - Office Called Patient >> Jul 11, 2017  9:33 AM Everardo PacificMoton, Kelly, [place] wrote: Reason for XBJ:[id number]:Patient had a missed call and would like a call back please

## 2017-07-12 ENCOUNTER — Other Ambulatory Visit: Payer: Self-pay | Admitting: *Deleted

## 2017-07-12 ENCOUNTER — Telehealth: Payer: Self-pay | Admitting: Family Medicine

## 2017-07-12 MED ORDER — TIZANIDINE HCL 2 MG PO TABS: 2.0000 mg | ORAL_TABLET | Freq: Three times a day (TID) | ORAL | 0 refills | Status: DC

## 2017-07-12 NOTE — Telephone Encounter (Signed)
Copied from CRM [id number]. Topic: Quick Communication - Rx Refill/Question >> Jul 12, 2017  2:56 PM Viviann SpareWhite, Selina wrote: Has the patient contacted their pharmacy? Yes.   tiZANidine (ZANAFLEX) 2 MG tablet  (Agent: If no, request that the patient contact the pharmacy for the refill.)  Preferred Pharmacy (with phone number or street name):  [facility] [place] - [address] [place] [id number] Phone: [phone] Fax: [phone]   Agent: Please be advised that RX refills may take up to 3 business days. We ask that you follow-up with your pharmacy.

## 2017-07-17 ENCOUNTER — Ambulatory Visit: Payer: Medicare HMO | Admitting: Cardiovascular Disease

## 2017-07-18 ENCOUNTER — Ambulatory Visit: Payer: Medicare HMO | Admitting: Family Medicine

## 2017-08-07 DIAGNOSIS — M5416 Radiculopathy, lumbar region: Secondary | ICD-10-CM

## 2017-08-07 DIAGNOSIS — M48061 Spinal stenosis, lumbar region without neurogenic claudication: Secondary | ICD-10-CM

## 2017-08-07 HISTORY — DX: Spinal stenosis, lumbar region without neurogenic claudication: M48.061

## 2017-08-07 HISTORY — DX: Radiculopathy, lumbar region: M54.16

## 2017-08-08 ENCOUNTER — Encounter: Payer: Self-pay | Admitting: Physician Assistant

## 2017-08-08 NOTE — Progress Notes (Deleted)
Cardiology Office Note Date:  08/08/2017  Patient ID:  Ashley, [name] Aug 08, 1940, MRN [id number] PCP:  Glori Luis, MD  Cardiologist:  New patient - ***  ***refresh   Chief Complaint: Evaluation of chest pain  History of Present Illness: Ashley [name] is a 77 y.o. female with history of HTN, HLD, hyperglycemia, AUB with benign endometrial polyps noted on D+C, depression, prior tobacco abuse quitting in 1978 with a 7.5 pack year history, and GERD who is referred by Dr. Adriana Simas for evaluation of chest pain.   She does not have any previously known cardiac history and has never had any prior noninvasive or invasive cardiac workup. She was seen by Dr. Adriana Simas on 04/24/2017 after having developed bilateral jaw pain and midline chest pain while at her husband's pulmonology appointment. Pain was described as an ache at that time and lasted ~ 10 minutes before resolving. This was followed by an episode of left arm pain several days later without associated chest pain. EKG was obtained at that time and was not acute. PCP drew outpatient troponin x 1 that was negative. She also had routine labs performed at that time that showed an dLDL of 101, A1c 5.9, unremarkable cbc, and unremarkable cmet outside of a glucose of 103. She has since established with another PCP as Dr. Adriana Simas left his prior practice, most recently being seen for neck pain and reflux on 07/09/2017.   ***   Past Medical History:  Diagnosis Date  . Abnormal uterine bleeding (AUB)    a. benign endometrial polyps on D+C in 03/2017  . Anxiety   . Arthritis   . Chicken pox   . Depression   . GERD (gastroesophageal reflux disease)   . History of vertigo   . Hyperlipidemia   . Hypertension     Past Surgical History:  Procedure Laterality Date  . DILATATION & CURETTAGE/HYSTEROSCOPY WITH MYOSURE N/A 03/30/2017   Procedure: DILATATION & CURETTAGE/HYSTEROSCOPY WITH MYOSURE;  Surgeon: Schermerhorn, Ihor Austin, MD;  Location: [facility]  ORS;  Service: Gynecology;  Laterality: N/A;  . EYE SURGERY Bilateral    Cataract Extraction with IOL  . SKIN LESION EXCISION    . TONSILLECTOMY AND ADENOIDECTOMY     Age 51    No outpatient medications have been marked as taking for the 08/13/17 encounter (Appointment) with Sondra Barges, PA-C.    Allergies:   Ace inhibitors; Pravastatin; and Simvastatin   Social History:  The patient  reports that she quit smoking about 41 years ago. Her smoking use included cigarettes. She has a 7.50 pack-year smoking history. she has never used smokeless tobacco. She reports that she drinks alcohol. She reports that she does not use drugs.   Family History:  The patient's family history includes Aneurysm in her father; CAD in her sister; COPD in her sister; Cancer in her brother; Heart disease in her father; Ovarian cancer in her sister; Rheum arthritis in her mother; Thrombosis in her maternal grandfather, maternal grandmother, paternal grandfather, and paternal grandmother.  ROS:   ROS   PHYSICAL EXAM: *** VS:  There were no vitals taken for this visit. BMI: There is no height or weight on file to calculate BMI.  Physical Exam   EKG:  Was ordered and interpreted by me today. Shows ***  Recent Labs: 04/24/2017: ALT 13; BUN 12; Creatinine, Ser 0.69; Hemoglobin 14.8; Platelets 354.0; Potassium 3.8; Sodium 136  04/24/2017: Cholesterol 187; Direct LDL 101.0; HDL 66.80; Total CHOL/HDL Ratio 3; Triglycerides  276.0; VLDL 55.2   CrCl cannot be calculated (Patient's most recent lab result is older than the maximum 21 days allowed.).   Wt Readings from Last 3 Encounters:  07/09/17 153 lb 9.6 oz (69.7 kg)  06/25/17 150 lb (68 kg)  04/24/17 151 lb 6 oz (68.7 kg)     Other studies reviewed: Additional studies/records reviewed today include: summarized above  ASSESSMENT AND PLAN:  1. ***  Disposition: F/u with *** in   Current medicines are reviewed at length with the patient today.  The patient  did not have any concerns regarding medicines.  Elinor DodgeSigned, Ryan Dunn PA-C 08/08/2017 3:12 PM     [facility] [place] [address] [place], [place] [address] [phone]

## 2017-08-13 ENCOUNTER — Ambulatory Visit: Payer: Medicare HMO | Admitting: Cardiovascular Disease

## 2017-08-13 ENCOUNTER — Encounter: Payer: Self-pay | Admitting: Cardiovascular Disease

## 2017-08-13 ENCOUNTER — Ambulatory Visit: Payer: Medicare HMO | Admitting: Physician Assistant

## 2017-08-13 ENCOUNTER — Other Ambulatory Visit: Payer: Self-pay

## 2017-08-13 VITALS — BP 130/84 | HR 88 | Ht 59.0 in | Wt 151.0 lb

## 2017-08-13 DIAGNOSIS — I10 Essential (primary) hypertension: Secondary | ICD-10-CM

## 2017-08-13 DIAGNOSIS — R079 Chest pain, unspecified: Secondary | ICD-10-CM

## 2017-08-13 DIAGNOSIS — E785 Hyperlipidemia, unspecified: Secondary | ICD-10-CM | POA: Diagnosis not present

## 2017-08-13 NOTE — Patient Instructions (Addendum)
Medication Instructions:  Your physician recommends that you continue on your current medications as directed. Please refer to the Current Medication list given to you today.   Labwork: none  Testing/Procedures: Your physician has requested that you have a lexiscan myoview. For further information please visit [email or web address] Please follow instruction sheet, as given.  [facility] MYOVIEW  Your caregiver has ordered a Stress Test with nuclear imaging. The purpose of this test is to evaluate the blood supply to your heart muscle. This procedure is referred to as a "Non-Invasive Stress Test." This is because other than having an IV started in your vein, nothing is inserted or "invades" your body. Cardiac stress tests are done to find areas of poor blood flow to the heart by determining the extent of coronary artery disease (CAD). Some patients exercise on a treadmill, which naturally increases the blood flow to your heart, while others who are  unable to walk on a treadmill due to physical limitations have a pharmacologic/chemical stress agent called Lexiscan . This medicine will mimic walking on a treadmill by temporarily increasing your coronary blood flow.   Please note: these test may take anywhere between 2-4 hours to complete  PLEASE REPORT TO [facility] MEDICAL MALL ENTRANCE  THE VOLUNTEERS AT THE FIRST DESK WILL DIRECT YOU WHERE TO GO  Date of Procedure:__Friday, January 11___  Arrival Time for Procedure:___7:15am___  Instructions regarding medication:   _xx___:  Hold HCTZ the morning of the test.   PLEASE NOTIFY THE OFFICE AT LEAST 24 HOURS IN ADVANCE IF YOU ARE UNABLE TO KEEP YOUR APPOINTMENT.  [phone] AND  PLEASE NOTIFY NUCLEAR MEDICINE AT [facility] AT LEAST 24 HOURS IN ADVANCE IF YOU ARE UNABLE TO KEEP YOUR APPOINTMENT. [phone]  How to prepare for your Myoview test:  1. Do not eat or drink after midnight 2. No caffeine for 24 hours prior to test 3. No smoking 24 hours prior  to test. 4. Your medication may be taken with water.  If your doctor stopped a medication because of this test, do not take that medication. 5. Ladies, please do not wear dresses.  Skirts or pants are appropriate. Please wear a short sleeve shirt. 6. No perfume, cologne or lotion. 7. Wear comfortable walking shoes. No heels!            Follow-Up: Your physician recommends that you schedule a follow-up appointment as needed.    Any Other Special Instructions Will Be Listed Below (If Applicable).     If you need a refill on your cardiac medications before your next appointment, please call your pharmacy.  Cardiac Nuclear Scan A cardiac nuclear scan is a test that measures blood flow to the heart when a person is resting and when he or she is exercising. The test looks for problems such as:  Not enough blood reaching a portion of the heart.  The heart muscle not working normally.  You may need this test if:  You have heart disease.  You have had abnormal lab results.  You have had heart surgery or angioplasty.  You have chest pain.  You have shortness of breath.  In this test, a radioactive dye (tracer) is injected into your bloodstream. After the tracer has traveled to your heart, an imaging device is used to measure how much of the tracer is absorbed by or distributed to various areas of your heart. This procedure is usually done at a hospital and takes 2-4 hours. Tell a health care provider about:  Any allergies  you have.  All medicines you are taking, including vitamins, herbs, eye drops, creams, and over-the-counter medicines.  Any problems you or family members have had with the use of anesthetic medicines.  Any blood disorders you have.  Any surgeries you have had.  Any medical conditions you have.  Whether you are pregnant or may be pregnant. What are the risks? Generally, this is a safe procedure. However, problems may occur, including:  Serious  chest pain and heart attack. This is only a risk if the stress portion of the test is done.  Rapid heartbeat.  Sensation of warmth in your chest. This usually passes quickly.  What happens before the procedure?  Ask your health care provider about changing or stopping your regular medicines. This is especially important if you are taking diabetes medicines or blood thinners.  Remove your jewelry on the day of the procedure. What happens during the procedure?  An IV tube will be inserted into one of your veins.  Your health care provider will inject a small amount of radioactive tracer through the tube.  You will wait for 20-40 minutes while the tracer travels through your bloodstream.  Your heart activity will be monitored with an electrocardiogram (ECG).  You will lie down on an exam table.  Images of your heart will be taken for about 15-20 minutes.  You may be asked to exercise on a treadmill or stationary bike. While you exercise, your heart's activity will be monitored with an ECG, and your blood pressure will be checked. If you are unable to exercise, you may be given a medicine to increase blood flow to parts of your heart.  When blood flow to your heart has peaked, a tracer will again be injected through the IV tube.  After 20-40 minutes, you will get back on the exam table and have more images taken of your heart.  When the procedure is over, your IV tube will be removed. The procedure may vary among health care providers and hospitals. Depending on the type of tracer used, scans may need to be repeated 3-4 hours later. What happens after the procedure?  Unless your health care provider tells you otherwise, you may return to your normal schedule, including diet, activities, and medicines.  Unless your health care provider tells you otherwise, you may increase your fluid intake. This will help flush the contrast dye from your body. Drink enough fluid to keep your urine  clear or pale yellow.  It is up to you to get your test results. Ask your health care provider, or the department that is doing the test, when your results will be ready. Summary  A cardiac nuclear scan measures the blood flow to the heart when a person is resting and when he or she is exercising.  You may need this test if you are at risk for heart disease.  Tell your health care provider if you are pregnant.  Unless your health care provider tells you otherwise, increase your fluid intake. This will help flush the contrast dye from your body. Drink enough fluid to keep your urine clear or pale yellow. This information is not intended to replace advice given to you by your health care provider. Make sure you discuss any questions you have with your health care provider. Document Released: 08/18/2004 Document Revised: 07/26/2016 Document Reviewed: 07/02/2013 Elsevier Interactive Patient Education  2017 [employer]

## 2017-08-13 NOTE — Progress Notes (Signed)
Cardiology Office Note   Date:  08/13/2017   ID:  Ashley Riffleatricia A Sterling, DOB 27-Nov-1940, MRN [id number]  PCP:  Glori LuisSonnenberg, Eric G, MD  Cardiologist:   Lorine BearsMuhammad Arida, MD   Chief Complaint  Patient presents with  . other    C/o jaw pain. Meds reviewed verbally with pt.      History of Present Illness: Ashley [name] is a 77 y.o. female who was referred by Dr. Birdie SonsSonnenberg for evaluation of jaw pain.  She has no previous cardiac history but has multiple chronic medical conditions including hyperlipidemia, hypertension, GERD chronic back pain.  She is not a smoker but has no family history of coronary artery disease. She reports prolonged symptoms of jaw pain mostly after she eats.  This usually last for about 2-3 minutes improved with Gas-X.  She reports no exertional jaw or chest discomfort but does complain of exertional dyspnea.  No orthopnea, PND or leg edema.  She reports having stress testing done with Dr. Gwen PoundsKowalski years ago but I am not able to find these results.     Past Medical History:  Diagnosis Date  . Abnormal uterine bleeding (AUB)    a. benign endometrial polyps on D+C in 03/2017  . Anxiety   . Arthritis   . Chicken pox   . Depression   . GERD (gastroesophageal reflux disease)   . History of vertigo   . Hyperlipidemia   . Hypertension     Past Surgical History:  Procedure Laterality Date  . DILATATION & CURETTAGE/HYSTEROSCOPY WITH MYOSURE N/A 03/30/2017   Procedure: DILATATION & CURETTAGE/HYSTEROSCOPY WITH MYOSURE;  Surgeon: Schermerhorn, Ihor Austinhomas J, MD;  Location: [facility] ORS;  Service: Gynecology;  Laterality: N/A;  . EYE SURGERY Bilateral    Cataract Extraction with IOL  . SKIN LESION EXCISION    . TONSILLECTOMY AND ADENOIDECTOMY     Age 77     Current Outpatient Medications  Medication Sig Dispense Refill  . aspirin EC 81 MG tablet Take 81 mg by mouth daily.    Marland Kitchen. estradiol (ESTRACE) 1 MG tablet take 2 tablets by mouth every morning    .  hydrochlorothiazide (HYDRODIURIL) 25 MG tablet Take 1 tablet (25 mg total) by mouth daily. 30 tablet 1  . omeprazole (PRILOSEC) 20 MG capsule Take 1 capsule (20 mg total) by mouth every other day. 15 capsule 0  . progesterone (PROMETRIUM) 100 MG capsule Take 100 mg by mouth daily.    . rosuvastatin (CRESTOR) 20 MG tablet Take 1 tablet (20 mg total) by mouth daily. 90 tablet 3  . tiZANidine (ZANAFLEX) 2 MG tablet Take 1 tablet (2 mg total) by mouth 3 (three) times daily. (Patient taking differently: Take 2 mg by mouth as needed. ) 30 tablet 0  . venlafaxine XR (EFFEXOR-XR) 150 MG 24 hr capsule Take 150 mg by mouth daily with breakfast.      No current facility-administered medications for this visit.     Allergies:   Ace inhibitors; Pravastatin; and Simvastatin    Social History:  The patient  reports that she quit smoking about 41 years ago. Her smoking use included cigarettes. She has a 7.50 pack-year smoking history. she has never used smokeless tobacco. She reports that she drinks alcohol. She reports that she does not use drugs.   Family History:  The patient's family history includes Aneurysm in her father; CAD in her sister; COPD in her sister; Cancer in her brother; Heart disease in her father; Ovarian cancer in  her sister; Rheum arthritis in her mother; Thrombosis in her maternal grandfather, maternal grandmother, paternal grandfather, and paternal grandmother.    ROS:  Please see the history of present illness.   Otherwise, review of systems are positive for none.   All other systems are reviewed and negative.    PHYSICAL EXAM: VS:  BP 130/84 (BP Location: Right Arm, Patient Position: Sitting, Cuff Size: Normal)   Pulse 88   Ht 4\' 11"  (1.499 m)   Wt 151 lb (68.5 kg)   BMI 30.50 kg/m  , BMI Body mass index is 30.5 kg/m. GEN: Well nourished, well developed, in no acute distress  HEENT: normal  Neck: no JVD, carotid bruits, or masses Cardiac: RRR; no murmurs, rubs, or gallops,no  edema  Respiratory:  clear to auscultation bilaterally, normal work of breathing GI: soft, nontender, nondistended, + BS MS: no deformity or atrophy  Skin: warm and dry, no rash Neuro:  Strength and sensation are intact Psych: euthymic mood, full affect   EKG:  EKG is ordered today. The ekg ordered today demonstrates normal sinus rhythm with sinus arrhythmia and low voltage.   Recent Labs: 04/24/2017: ALT 13; BUN 12; Creatinine, Ser 0.69; Hemoglobin 14.8; Platelets 354.0; Potassium 3.8; Sodium 136    Lipid Panel    Component Value Date/Time   CHOL 187 04/24/2017 1019   TRIG 276.0 (H) 04/24/2017 1019   HDL 66.80 04/24/2017 1019   CHOLHDL 3 04/24/2017 1019   VLDL 55.2 (H) 04/24/2017 1019   LDLDIRECT 101.0 04/24/2017 1019      Wt Readings from Last 3 Encounters:  08/13/17 151 lb (68.5 kg)  07/09/17 153 lb 9.6 oz (69.7 kg)  06/25/17 150 lb (68 kg)        PAD Screen 08/13/2017  Previous PAD dx? No  Previous surgical procedure? No  Pain with walking? No  Feet/toe relief with dangling? No  Painful, non-healing ulcers? No  Extremities discolored? No      ASSESSMENT AND PLAN:  1.  Atypical jaw pain: Multiple risk factors for coronary artery disease.  I requested a pharmacologic nuclear stress test for evaluation.  She is not able to exercise on a treadmill given chronic back pain.  Her symptoms might be GI in nature.  2.  Essential hypertension: Blood pressure is reasonably controlled on hydrochlorothiazide.  3.  Hyperlipidemia: Currently on rosuvastatin.    Disposition:   FU with me as needed  Signed,  Lorine Bears, MD  08/13/2017 2:23 PM    [facility]

## 2017-08-14 ENCOUNTER — Telehealth: Payer: Self-pay | Admitting: Cardiovascular Disease

## 2017-08-14 NOTE — Telephone Encounter (Signed)
Pt calling stating on 08/17/17 she has a myoview and that she has back issues. She states when she lays down straight for a bit that it hurts her back.   She is calling asking if she is allowed to take half an oxycodone before coming in for Myoview    Please advise.

## 2017-08-14 NOTE — Telephone Encounter (Signed)
I spoke with the patient. She is aware she may take a 1/2 of an oxycodone if needed for her back (bulging disk), the morning of her myoview. I advised her she will be lying flat for two 15 minute increments for pictures. She feels like she will be able to do this.

## 2017-08-17 ENCOUNTER — Encounter
Admission: RE | Admit: 2017-08-17 | Discharge: 2017-08-17 | Disposition: A | Discharge status: Home / Self Care | Payer: Medicare HMO | Source: Ambulatory Visit | Attending: Cardiovascular Disease | Admitting: Cardiovascular Disease

## 2017-08-17 DIAGNOSIS — R079 Chest pain, unspecified: Secondary | ICD-10-CM | POA: Diagnosis not present

## 2017-08-17 LAB — NM MYOCAR MULTI W/SPECT W/WALL MOTION / EF
LV dias vol: 36 mL (ref 46–106)
LV sys vol: 6 mL
Peak HR: 113 {beats}/min
Percent HR: 77 %
Rest HR: 81 {beats}/min
SDS: 0
SRS: 3
SSS: 0
TID: 1

## 2017-08-17 MED ORDER — REGADENOSON 0.4 MG/5ML IV SOLN
0.4000 mg | Freq: Once | INTRAVENOUS | Status: AC
  Administered 2017-08-17: 0.4 mg via INTRAVENOUS

## 2017-08-17 MED ORDER — TECHNETIUM TC 99M TETROFOSMIN IV KIT
29.3600 | PACK | Freq: Once | INTRAVENOUS | Status: AC | PRN
Start: 2017-08-17 — End: 2017-08-17
  Administered 2017-08-17: 29.36 via INTRAVENOUS

## 2017-08-17 MED ORDER — TECHNETIUM TC 99M TETROFOSMIN IV KIT
14.3700 | PACK | Freq: Once | INTRAVENOUS | Status: AC | PRN
  Administered 2017-08-17: 14.37 via INTRAVENOUS

## 2017-09-26 ENCOUNTER — Other Ambulatory Visit: Payer: Self-pay | Admitting: Physician Assistant

## 2017-09-26 DIAGNOSIS — M48061 Spinal stenosis, lumbar region without neurogenic claudication: Secondary | ICD-10-CM

## 2017-09-26 DIAGNOSIS — M5416 Radiculopathy, lumbar region: Principal | ICD-10-CM

## 2017-09-26 DIAGNOSIS — M5137 Other intervertebral disc degeneration, lumbosacral region: Secondary | ICD-10-CM | POA: Diagnosis not present

## 2017-10-01 ENCOUNTER — Other Ambulatory Visit: Payer: Self-pay | Admitting: Family Medicine

## 2017-10-03 NOTE — Telephone Encounter (Signed)
Last OV 07/09/17 filed under historical

## 2017-10-08 ENCOUNTER — Ambulatory Visit: Payer: Medicare HMO | Admitting: Family Medicine

## 2017-10-08 ENCOUNTER — Encounter: Payer: Self-pay | Admitting: Family Medicine

## 2017-10-08 ENCOUNTER — Other Ambulatory Visit: Payer: Self-pay

## 2017-10-08 VITALS — BP 126/78 | HR 79 | Temp 98.1°F | Wt 154.4 lb

## 2017-10-08 DIAGNOSIS — R600 Localized edema: Secondary | ICD-10-CM | POA: Diagnosis not present

## 2017-10-08 DIAGNOSIS — I10 Essential (primary) hypertension: Secondary | ICD-10-CM | POA: Diagnosis not present

## 2017-10-08 DIAGNOSIS — G8929 Other chronic pain: Secondary | ICD-10-CM | POA: Diagnosis not present

## 2017-10-08 DIAGNOSIS — R42 Dizziness and giddiness: Secondary | ICD-10-CM

## 2017-10-08 DIAGNOSIS — M545 Low back pain: Secondary | ICD-10-CM

## 2017-10-08 DIAGNOSIS — R7303 Prediabetes: Secondary | ICD-10-CM

## 2017-10-08 DIAGNOSIS — E785 Hyperlipidemia, unspecified: Secondary | ICD-10-CM

## 2017-10-08 LAB — CBC
HCT: 43.7 % (ref 36.0–46.0)
Hemoglobin: 14.7 g/dL (ref 12.0–15.0)
MCHC: 33.5 g/dL (ref 30.0–36.0)
MCV: 89.7 fl (ref 78.0–100.0)
Platelets: 336 10*3/uL (ref 150.0–400.0)
RBC: 4.87 Mil/uL (ref 3.87–5.11)
RDW: 13.3 % (ref 11.5–15.5)
WBC: 9.2 10*3/uL (ref 4.0–10.5)

## 2017-10-08 LAB — TSH: TSH: 3.93 u[IU]/mL (ref 0.35–4.50)

## 2017-10-08 LAB — COMPREHENSIVE METABOLIC PANEL
ALT: 18 U/L (ref 0–35)
AST: 19 U/L (ref 0–37)
Albumin: 3.8 g/dL (ref 3.5–5.2)
Alkaline Phosphatase: 43 U/L (ref 39–117)
BUN: 15 mg/dL (ref 6–23)
CO2: 28 mEq/L (ref 19–32)
Calcium: 9.8 mg/dL (ref 8.4–10.5)
Chloride: 103 mEq/L (ref 96–112)
Creatinine, Ser: 0.73 mg/dL (ref 0.40–1.20)
GFR: 82.73 mL/min (ref >60.00)
Glucose, Bld: 89 mg/dL (ref 70–99)
Potassium: 4 mEq/L (ref 3.5–5.1)
Sodium: 140 mEq/L (ref 135–145)
Total Bilirubin: 0.3 mg/dL (ref 0.2–1.2)
Total Protein: 6.6 g/dL (ref 6.0–8.3)

## 2017-10-08 LAB — HEMOGLOBIN A1C: Hgb A1c MFr Bld: 6 % (ref 4.6–6.5)

## 2017-10-08 LAB — LDL CHOLESTEROL, DIRECT: Direct LDL: 62 mg/dL

## 2017-10-08 MED ORDER — ALPRAZOLAM 0.5 MG PO TABS: 0.5000 mg | ORAL_TABLET | Freq: Once | ORAL | 0 refills | Status: AC

## 2017-10-08 NOTE — Patient Instructions (Signed)
Nice to see you. We will check lab work today to evaluate for cause of your swelling.  If it worsens please let us know. If you develop trouble breathing or chest pain please be evaluated immediately. I have prescribed Xanax to take 60 minutes prior to your MRI.  You need to have somebody transport you to and from the MRI.

## 2017-10-08 NOTE — Progress Notes (Signed)
Tommi Rumps, MD Phone: [phone]  Ashley [name] is a 77 y.o. female who presents today for f/u.  HYPERTENSION  Disease Monitoring  Home BP Monitoring not checking Chest pain- no    Dyspnea- no Medications  Compliance-  Taking HCTZ.   Edema- yes, see below  Patient notes she woke up this morning and has noted a little bit of swelling in her bilateral feet.  She has not previously noted swelling.  She notes no orthopnea or PND.  No unilateral swelling.  No recent travel or surgery.  Hyperlipidemia: Taking Crestor.  Right upper quadrant pain or myalgias.  She has been following with orthopedics and neurosurgery for first and second opinions of low back pain.  She notes her neck pain went away.  Does note the back pain radiates down her right lateral leg at times.  They are planning on doing an MRI in the next couple of weeks.  She does get claustrophobic and is requesting medication for the MRI.  No numbness or weakness.  No bowel or bladder incontinence.  Patient does report a history of vertigo in the past.  She notes on one occasion last week she had a little bit of lightheadedness.  Resolves quickly and has not recurred.  No room spinning sensation.   Social History   Tobacco Use  Smoking Status Former Smoker  . Packs/day: 0.50  . Years: 15.00  . Pack years: 7.50  . Types: Cigarettes  . Last attempt to quit: 08/07/1976  . Years since quitting: 41.2  Smokeless Tobacco Never Used     ROS see history of present illness  Objective  Physical Exam Vitals:   10/08/17 1340  BP: 126/78  Pulse: 79  Temp: 98.1 F (36.7 C)  SpO2: 94%    BP Readings from Last 3 Encounters:  10/08/17 126/78  08/13/17 130/84  07/09/17 128/80   Wt Readings from Last 3 Encounters:  10/08/17 154 lb 6.4 oz (70 kg)  08/13/17 151 lb (68.5 kg)  07/09/17 153 lb 9.6 oz (69.7 kg)   Laying blood pressure 148/86 pulse 80 Sitting blood pressure 144/94 pulse 88 Standing blood pressure 150/89  pulse 85   Physical Exam  Constitutional: No distress.  Cardiovascular: Normal rate, regular rhythm and normal heart sounds.  Pulmonary/Chest: Effort normal and breath sounds normal.  Musculoskeletal:  1+ pitting edema to lower shin, bilateral, no calf tenderness  Neurological: She is alert. Gait normal.  Skin: Skin is warm and dry. She is not diaphoretic.     Assessment/Plan: Please see individual problem list.  Essential hypertension Well-controlled.  Continue current regimen.  Prediabetes Check A1c.  Bilateral leg edema Lower leg edema.  New onset.  No CHF symptoms.  No risk factors for DVT.  Discussed potential causes.  We will check lab work.  She will elevate her legs.  Given return precautions.  Hyperlipidemia Check direct LDL.  Continue Crestor.  Chronic low back pain She is following with 2 surgeons.  She is having an MRI next week and has claustrophobia.  I have prescribed a dose of Xanax to take prior to the MRI.  She was advised not to drive to or from the imaging center.  Lightheadedness Single recent episode.  No recurrence.  No other symptoms with this.  Orthostatics negative.  Could have represented vertigo versus lightheadedness.  Given that it has not recurred she will monitor.  If it recurs she will be reevaluated.   Orders Placed This Encounter  Procedures  .  TSH  . CBC  . Comp Met (CMET)  . HgB A1c  . Direct LDL    Meds ordered this encounter  Medications  . ALPRAZolam (XANAX) 0.5 MG tablet    Sig: Take 1 tablet (0.5 mg total) by mouth once for 1 dose. Take 60 minutes prior to MRI    Dispense:  1 tablet    Refill:  0     Tommi Rumps, MD [place]

## 2017-10-09 NOTE — Telephone Encounter (Signed)
Sent to pharmacy 

## 2017-10-11 DIAGNOSIS — R42 Dizziness and giddiness: Secondary | ICD-10-CM | POA: Insufficient documentation

## 2017-10-11 DIAGNOSIS — R600 Localized edema: Secondary | ICD-10-CM | POA: Insufficient documentation

## 2017-10-11 DIAGNOSIS — G8929 Other chronic pain: Secondary | ICD-10-CM | POA: Insufficient documentation

## 2017-10-11 DIAGNOSIS — M545 Low back pain, unspecified: Secondary | ICD-10-CM | POA: Insufficient documentation

## 2017-10-11 DIAGNOSIS — R7303 Prediabetes: Secondary | ICD-10-CM | POA: Insufficient documentation

## 2017-10-11 NOTE — Assessment & Plan Note (Signed)
Check A1c. 

## 2017-10-11 NOTE — Assessment & Plan Note (Signed)
Single recent episode.  No recurrence.  No other symptoms with this.  Orthostatics negative.  Could have represented vertigo versus lightheadedness.  Given that it has not recurred she will monitor.  If it recurs she will be reevaluated.

## 2017-10-11 NOTE — Assessment & Plan Note (Signed)
Lower leg edema.  New onset.  No CHF symptoms.  No risk factors for DVT.  Discussed potential causes.  We will check lab work.  She will elevate her legs.  Given return precautions.

## 2017-10-11 NOTE — Assessment & Plan Note (Signed)
She is following with 2 surgeons.  She is having an MRI next week and has claustrophobia.  I have prescribed a dose of Xanax to take prior to the MRI.  She was advised not to drive to or from the imaging center.

## 2017-10-11 NOTE — Assessment & Plan Note (Signed)
Check direct LDL.  Continue Crestor. 

## 2017-10-11 NOTE — Assessment & Plan Note (Signed)
Well-controlled.  Continue current regimen. 

## 2017-10-16 ENCOUNTER — Ambulatory Visit
Admission: RE | Admit: 2017-10-16 | Discharge: 2017-10-16 | Disposition: A | Discharge status: Home / Self Care | Payer: Medicare HMO | Source: Ambulatory Visit | Attending: Physician Assistant | Admitting: Physician Assistant

## 2017-10-16 ENCOUNTER — Ambulatory Visit: Payer: Medicare HMO

## 2017-10-16 DIAGNOSIS — M48061 Spinal stenosis, lumbar region without neurogenic claudication: Secondary | ICD-10-CM | POA: Diagnosis present

## 2017-10-16 DIAGNOSIS — M5127 Other intervertebral disc displacement, lumbosacral region: Secondary | ICD-10-CM | POA: Insufficient documentation

## 2017-10-16 DIAGNOSIS — M47817 Spondylosis without myelopathy or radiculopathy, lumbosacral region: Secondary | ICD-10-CM | POA: Insufficient documentation

## 2017-10-16 DIAGNOSIS — M5136 Other intervertebral disc degeneration, lumbar region: Secondary | ICD-10-CM | POA: Diagnosis not present

## 2017-10-16 DIAGNOSIS — M545 Low back pain: Secondary | ICD-10-CM | POA: Diagnosis not present

## 2017-10-16 DIAGNOSIS — M5416 Radiculopathy, lumbar region: Secondary | ICD-10-CM | POA: Diagnosis present

## 2017-10-25 DIAGNOSIS — M48061 Spinal stenosis, lumbar region without neurogenic claudication: Secondary | ICD-10-CM | POA: Diagnosis not present

## 2017-10-25 DIAGNOSIS — M5416 Radiculopathy, lumbar region: Secondary | ICD-10-CM | POA: Diagnosis not present

## 2017-11-12 DIAGNOSIS — M48061 Spinal stenosis, lumbar region without neurogenic claudication: Secondary | ICD-10-CM | POA: Insufficient documentation

## 2017-11-13 DIAGNOSIS — I10 Essential (primary) hypertension: Secondary | ICD-10-CM | POA: Diagnosis not present

## 2017-11-13 DIAGNOSIS — M48061 Spinal stenosis, lumbar region without neurogenic claudication: Secondary | ICD-10-CM | POA: Diagnosis not present

## 2017-11-13 DIAGNOSIS — K219 Gastro-esophageal reflux disease without esophagitis: Secondary | ICD-10-CM | POA: Diagnosis not present

## 2017-11-13 DIAGNOSIS — Z01818 Encounter for other preprocedural examination: Secondary | ICD-10-CM | POA: Diagnosis not present

## 2017-11-13 DIAGNOSIS — M5416 Radiculopathy, lumbar region: Secondary | ICD-10-CM | POA: Diagnosis not present

## 2017-11-16 ENCOUNTER — Telehealth: Payer: Self-pay | Admitting: Family Medicine

## 2017-11-16 NOTE — Telephone Encounter (Signed)
Please advise if pt needs to be seen. Pt last seen on 10/08/17.

## 2017-11-16 NOTE — Telephone Encounter (Signed)
She will need to be seen. I would suggest [facility] for evaluation.

## 2017-11-16 NOTE — Telephone Encounter (Signed)
Pt calling to check status on having something called in for cough/congestion? She states she is having surgery on Monday and needs something before then or she will have to cancel her surgery. She states she would like to see if need be if she can be worked in today or if she should go to urgent care?

## 2017-11-16 NOTE — Telephone Encounter (Signed)
Copied from CRM [phone]#[id number]. Topic: General - Other >> Nov 16, 2017  9:52 AM Gerrianne ScalePayne, Angela L wrote: Reason for CRM: pt calling wanting a RX for  her sinus pt symptoms are sore throat cough greenish like flem head congestion

## 2017-11-16 NOTE — Telephone Encounter (Signed)
Pt.notified

## 2017-11-17 ENCOUNTER — Ambulatory Visit
Admission: EM | Admit: 2017-11-17 | Discharge: 2017-11-17 | Disposition: A | Discharge status: Home / Self Care | Payer: Medicare HMO | Attending: Family Medicine | Admitting: Family Medicine

## 2017-11-17 ENCOUNTER — Other Ambulatory Visit: Payer: Self-pay

## 2017-11-17 DIAGNOSIS — J309 Allergic rhinitis, unspecified: Secondary | ICD-10-CM | POA: Diagnosis not present

## 2017-11-17 DIAGNOSIS — R0981 Nasal congestion: Secondary | ICD-10-CM | POA: Diagnosis not present

## 2017-11-17 NOTE — Discharge Instructions (Addendum)
Over the counter antihistamine once daily; over the counter steroid nasal spray (ie flonase)

## 2017-11-17 NOTE — ED Provider Notes (Signed)
[facility]    CSN: [id number] Arrival date & time: 11/17/17  0848     History   Chief Complaint Chief Complaint  Patient presents with  . Nasal Congestion    HPI Ashley [name] is a 77 y.o. female.   The history is provided by the patient.  URI  Presenting symptoms: congestion and rhinorrhea   Severity:  Moderate Onset quality:  Sudden Duration:  3 days Timing:  Constant Progression:  Worsening Chronicity:  New Relieved by:  Nothing Ineffective treatments:  OTC medications Associated symptoms: no sinus pain and no wheezing   Risk factors: being elderly   Risk factors: no chronic cardiac disease, no chronic kidney disease, no chronic respiratory disease, no diabetes mellitus, no immunosuppression, no recent illness, no recent travel and no sick contacts     Past Medical History:  Diagnosis Date  . Abnormal uterine bleeding (AUB)    a. benign endometrial polyps on D+C in 03/2017  . Anxiety   . Arthritis   . Chicken pox   . Depression   . GERD (gastroesophageal reflux disease)   . History of vertigo   . Hyperlipidemia   . Hypertension     Patient Active Problem List   Diagnosis Date Noted  . Bilateral leg edema 10/11/2017  . Prediabetes 10/11/2017  . Chronic low back pain 10/11/2017  . Lightheadedness 10/11/2017  . Post-menopausal bleeding 07/09/2017  . Neck pain 07/09/2017  . Atypical chest pain 04/24/2017  . Jaw pain 04/24/2017  . Family history of ovarian cancer 01/16/2017  . Essential hypertension 01/16/2017  . GERD (gastroesophageal reflux disease) 01/16/2017  . Depression 12/28/2014  . Hyperlipidemia 12/28/2014    Past Surgical History:  Procedure Laterality Date  . DILATATION & CURETTAGE/HYSTEROSCOPY WITH MYOSURE N/A 03/30/2017   Procedure: DILATATION & CURETTAGE/HYSTEROSCOPY WITH MYOSURE;  Surgeon: Schermerhorn, Ihor Austinhomas J, MD;  Location: [facility];  Service: Gynecology;  Laterality: N/A;  . EYE SURGERY Bilateral    Cataract  Extraction with IOL  . SKIN LESION EXCISION    . TONSILLECTOMY AND ADENOIDECTOMY     Age 77    OB History   None      Home Medications    Prior to Admission medications   Medication Sig Start Date End Date Taking? Authorizing Provider  aspirin EC 81 MG tablet Take 81 mg by mouth daily.    [provider]  estradiol (ESTRACE) 1 MG tablet take 2 tablets by mouth every morning 06/10/15   [provider]  hydrochlorothiazide (HYDRODIURIL) 25 MG tablet Take 1 tablet (25 mg total) by mouth daily. 04/15/16   Minna AntisPaduchowski, Kevin, MD  ibuprofen (ADVIL,MOTRIN) 200 MG tablet Take 200 mg by mouth every 4 (four) hours as needed.    [provider]  omeprazole (PRILOSEC) 20 MG capsule Take 1 capsule (20 mg total) by mouth every other day. 07/09/17   Glori LuisSonnenberg, Eric G, MD  progesterone (PROMETRIUM) 100 MG capsule Take 100 mg by mouth daily.    [provider]  rosuvastatin (CRESTOR) 20 MG tablet Take 1 tablet (20 mg total) by mouth daily. 04/26/17   Tommie Samsook, Jayce G, DO  tiZANidine (ZANAFLEX) 2 MG tablet Take 1 tablet (2 mg total) by mouth 3 (three) times daily. Patient taking differently: Take 2 mg by mouth as needed.  07/12/17   Roddie McKordsmeier, Julia, FNP  traMADol (ULTRAM) 50 MG tablet Take 50 mg by mouth every 6 (six) hours as needed. 09/26/17   [provider]  venlafaxine XR (EFFEXOR-XR)  150 MG 24 hr capsule TAKE 1 CAPSULE BY MOUTH DAILY 10/09/17   Glori Luis, MD    Family History Family History  Problem Relation Age of Onset  . Rheum arthritis Mother   . Ovarian cancer Sister   . COPD Sister   . CAD Sister   . Heart disease Father   . Aneurysm Father   . Cancer Brother   . Thrombosis Maternal Grandmother        a. DVT  . Thrombosis Maternal Grandfather        a. DVT  . Thrombosis Paternal Grandmother        a. DVT  . Thrombosis Paternal Grandfather        a. DVT    Social History Social History   Tobacco Use  . Smoking status: Former  Smoker    Packs/day: 0.50    Years: 15.00    Pack years: 7.50    Types: Cigarettes    Last attempt to quit: 08/07/1976    Years since quitting: 41.3  . Smokeless tobacco: Never Used  Substance Use Topics  . Alcohol use: Yes    Comment: ocassionally  . Drug use: No     Allergies   Ace inhibitors; Pravastatin; and Simvastatin   Review of Systems Review of Systems  HENT: Positive for congestion and rhinorrhea. Negative for sinus pain.   Respiratory: Negative for wheezing.      Physical Exam Triage Vital Signs ED Triage Vitals  Enc Vitals Group     BP 11/17/17 0856 (!) 160/96     Pulse Rate 11/17/17 0856 90     Resp 11/17/17 0856 18     Temp 11/17/17 0856 98.4 F (36.9 C)     Temp Source 11/17/17 0856 Oral     SpO2 11/17/17 0856 96 %     Weight 11/17/17 0857 149 lb (67.6 kg)     Height 11/17/17 0857 4\' 11"  (1.499 m)     Head Circumference --      Peak Flow --      Pain Score 11/17/17 0857 0     Pain Loc --      Pain Edu? --      Excl. in GC? --    No data found.  Updated Vital Signs BP (!) 160/96 (BP Location: Right Arm) Comment: Has not taken her blood pressure med yet today  Pulse 90   Temp 98.4 F (36.9 C) (Oral)   Resp 18   Ht 4\' 11"  (1.499 m)   Wt 149 lb (67.6 kg)   SpO2 96%   BMI 30.09 kg/m   Visual Acuity Right Eye Distance:   Left Eye Distance:   Bilateral Distance:    Right Eye Near:   Left Eye Near:    Bilateral Near:     Physical Exam  Constitutional: She appears well-developed and well-nourished. No distress.  HENT:  Head: Normocephalic and atraumatic.  Right Ear: Tympanic membrane, external ear and ear canal normal.  Left Ear: Tympanic membrane, external ear and ear canal normal.  Nose: Mucosal edema and rhinorrhea present. No nose lacerations, sinus tenderness, nasal deformity, septal deviation or nasal septal hematoma. No epistaxis.  No foreign bodies. Right sinus exhibits no maxillary sinus tenderness and no frontal sinus  tenderness. Left sinus exhibits no maxillary sinus tenderness and no frontal sinus tenderness.  Mouth/Throat: Uvula is midline, oropharynx is clear and moist and mucous membranes are normal. No oropharyngeal exudate, posterior oropharyngeal edema or posterior oropharyngeal  erythema. No tonsillar exudate.  Eyes: Conjunctivae are normal. Right eye exhibits no discharge. Left eye exhibits no discharge. No scleral icterus.  Neck: Normal range of motion. Neck supple. No thyromegaly present.  Cardiovascular: Normal rate, regular rhythm and normal heart sounds.  Pulmonary/Chest: Effort normal and breath sounds normal. No stridor. No respiratory distress. She has no wheezes. She has no rales.  Lymphadenopathy:    She has no cervical adenopathy.  Skin: She is not diaphoretic.  Nursing note and vitals reviewed.    UC Treatments / Results  Labs (all labs ordered are listed, but only abnormal results are displayed) Labs Reviewed - No data to display  EKG None Radiology No results found.  Procedures Procedures (including critical care time)  Medications Ordered in UC Medications - No data to display   Initial Impression / Assessment and Plan / UC Course  I have reviewed the triage vital signs and the nursing notes.  Pertinent labs & imaging results that were available during my care of the patient were reviewed by me and considered in my medical decision making (see chart for details).       Final Clinical Impressions(s) / UC Diagnoses   Final diagnoses:  Allergic rhinitis, unspecified seasonality, unspecified trigger  Nasal congestion    ED Discharge Orders    None     1. diagnosis reviewed with patient 2. Recommend supportive treatment with rest, fluids, otc flonase, antihistamine; stop sudafed (due blood pressure) 3. Follow-up prn if symptoms worsen or don't improve  Controlled Substance Prescriptions [place] Controlled Substance Registry consulted? Not Applicable   Payton Mccallum, MD 11/17/17 [phone]

## 2017-11-17 NOTE — ED Triage Notes (Signed)
Pt with ear fullness, head and nasal congestion since Wednesday. Concerned over scheduled to have back surgery on Monday.

## 2017-11-19 DIAGNOSIS — E669 Obesity, unspecified: Secondary | ICD-10-CM | POA: Diagnosis not present

## 2017-11-19 DIAGNOSIS — Z888 Allergy status to other drugs, medicaments and biological substances status: Secondary | ICD-10-CM | POA: Diagnosis not present

## 2017-11-19 DIAGNOSIS — E785 Hyperlipidemia, unspecified: Secondary | ICD-10-CM | POA: Diagnosis not present

## 2017-11-19 DIAGNOSIS — M48061 Spinal stenosis, lumbar region without neurogenic claudication: Secondary | ICD-10-CM | POA: Diagnosis not present

## 2017-11-19 DIAGNOSIS — M5416 Radiculopathy, lumbar region: Secondary | ICD-10-CM | POA: Diagnosis not present

## 2017-11-19 DIAGNOSIS — Z6830 Body mass index (BMI) 30.0-30.9, adult: Secondary | ICD-10-CM | POA: Diagnosis not present

## 2017-11-19 DIAGNOSIS — K219 Gastro-esophageal reflux disease without esophagitis: Secondary | ICD-10-CM | POA: Diagnosis not present

## 2017-11-19 DIAGNOSIS — Z79899 Other long term (current) drug therapy: Secondary | ICD-10-CM | POA: Diagnosis not present

## 2017-11-19 DIAGNOSIS — I10 Essential (primary) hypertension: Secondary | ICD-10-CM | POA: Diagnosis not present

## 2017-11-19 HISTORY — PX: BACK SURGERY: SHX140

## 2017-12-04 DIAGNOSIS — J208 Acute bronchitis due to other specified organisms: Secondary | ICD-10-CM | POA: Diagnosis not present

## 2017-12-12 DIAGNOSIS — Z9889 Other specified postprocedural states: Secondary | ICD-10-CM | POA: Diagnosis not present

## 2018-01-03 DIAGNOSIS — Z9889 Other specified postprocedural states: Secondary | ICD-10-CM | POA: Diagnosis not present

## 2018-01-16 DIAGNOSIS — L578 Other skin changes due to chronic exposure to nonionizing radiation: Secondary | ICD-10-CM | POA: Diagnosis not present

## 2018-01-16 DIAGNOSIS — H0264 Xanthelasma of left upper eyelid: Secondary | ICD-10-CM | POA: Diagnosis not present

## 2018-01-16 DIAGNOSIS — L821 Other seborrheic keratosis: Secondary | ICD-10-CM | POA: Diagnosis not present

## 2018-01-17 ENCOUNTER — Other Ambulatory Visit: Payer: Self-pay | Admitting: Family Medicine

## 2018-01-24 ENCOUNTER — Ambulatory Visit (INDEPENDENT_AMBULATORY_CARE_PROVIDER_SITE_OTHER): Payer: Medicare HMO | Admitting: Internal Medicine

## 2018-01-24 ENCOUNTER — Encounter: Payer: Self-pay | Admitting: Internal Medicine

## 2018-01-24 VITALS — BP 134/66 | HR 86 | Temp 98.3°F | Ht 59.0 in | Wt 148.6 lb

## 2018-01-24 DIAGNOSIS — G2581 Restless legs syndrome: Secondary | ICD-10-CM | POA: Diagnosis not present

## 2018-01-24 MED ORDER — GABAPENTIN 100 MG PO CAPS: 100.0000 mg | ORAL_CAPSULE | Freq: Every day | ORAL | 1 refills | Status: DC

## 2018-01-24 NOTE — Progress Notes (Signed)
Chief Complaint  Patient presents with  . Follow-up    restless legs   Acute visit  1. C/w restless leg syndrome cant stop moving legs at night and has tingling sensation due to these sx's cant not sleep and waking husband up. Tried zanaflex w/o relief.    Review of Systems  Constitutional: Positive for weight loss.       Down 6 lbs   Neurological: Positive for sensory change.       +restless legs    Psychiatric/Behavioral: The patient has insomnia.    Past Medical History:  Diagnosis Date  . Abnormal uterine bleeding (AUB)    a. benign endometrial polyps on D+C in 03/2017  . Anxiety   . Arthritis   . Chicken pox   . Depression   . GERD (gastroesophageal reflux disease)   . History of vertigo   . Hyperlipidemia   . Hypertension    Past Surgical History:  Procedure Laterality Date  . DILATATION & CURETTAGE/HYSTEROSCOPY WITH MYOSURE N/A 03/30/2017   Procedure: DILATATION & CURETTAGE/HYSTEROSCOPY WITH MYOSURE;  Surgeon: Schermerhorn, Ihor Austinhomas J, MD;  Location: [facility];  Service: Gynecology;  Laterality: N/A;  . EYE SURGERY Bilateral    Cataract Extraction with IOL  . SKIN LESION EXCISION    . TONSILLECTOMY AND ADENOIDECTOMY     Age 77   Family History  Problem Relation Age of Onset  . Rheum arthritis Mother   . Ovarian cancer Sister   . COPD Sister   . CAD Sister   . Heart disease Father   . Aneurysm Father   . Cancer Brother   . Thrombosis Maternal Grandmother        a. DVT  . Thrombosis Maternal Grandfather        a. DVT  . Thrombosis Paternal Grandmother        a. DVT  . Thrombosis Paternal Grandfather        a. DVT   Social History   Socioeconomic History  . Marital status: Married    Spouse name: Not on file  . Number of children: Not on file  . Years of education: Not on file  . Highest education level: Not on file  Occupational History  . Not on file  Social Needs  . Financial resource strain: Not on file  . Food insecurity:    Worry: Not on  file    Inability: Not on file  . Transportation needs:    Medical: Not on file    Non-medical: Not on file  Tobacco Use  . Smoking status: Former Smoker    Packs/day: 0.50    Years: 15.00    Pack years: 7.50    Types: Cigarettes    Last attempt to quit: 08/07/1976    Years since quitting: 41.4  . Smokeless tobacco: Never Used  Substance and Sexual Activity  . Alcohol use: Yes    Comment: ocassionally  . Drug use: No  . Sexual activity: Not on file  Lifestyle  . Physical activity:    Days per week: Not on file    Minutes per session: Not on file  . Stress: Not on file  Relationships  . Social connections:    Talks on phone: Not on file    Gets together: Not on file    Attends religious service: Not on file    Active member of club or organization: Not on file    Attends meetings of clubs or organizations: Not on file  Relationship status: Not on file  . Intimate partner violence:    Fear of current or ex partner: Not on file    Emotionally abused: Not on file    Physically abused: Not on file    Forced sexual activity: Not on file  Other Topics Concern  . Not on file  Social History Narrative   Married    Current Meds  Medication Sig  . aspirin EC 81 MG tablet Take 81 mg by mouth daily.  Marland Kitchen estradiol (ESTRACE) 1 MG tablet take 2 tablets by mouth every morning  . hydrochlorothiazide (HYDRODIURIL) 25 MG tablet Take 1 tablet (25 mg total) by mouth daily.  Marland Kitchen ibuprofen (ADVIL,MOTRIN) 200 MG tablet Take 200 mg by mouth every 4 (four) hours as needed.  Marland Kitchen omeprazole (PRILOSEC) 20 MG capsule TAKE 1 CAPSULE EVERY OTHER DAY  . progesterone (PROMETRIUM) 100 MG capsule Take 100 mg by mouth daily.  . rosuvastatin (CRESTOR) 20 MG tablet Take 1 tablet (20 mg total) by mouth daily.  Marland Kitchen tiZANidine (ZANAFLEX) 2 MG tablet Take 1 tablet (2 mg total) by mouth 3 (three) times daily. (Patient taking differently: Take 2 mg by mouth as needed. )  . traMADol (ULTRAM) 50 MG tablet Take 50 mg by  mouth every 6 (six) hours as needed.  . venlafaxine XR (EFFEXOR-XR) 150 MG 24 hr capsule TAKE 1 CAPSULE BY MOUTH DAILY   Allergies  Allergen Reactions  . Ace Inhibitors     Angioedema  . Pravastatin Other (See Comments)    Other reaction(s): Muscle Pain  . Simvastatin Other (See Comments)    stiffness   No results found for this or any previous visit (from the past 2160 hour(s)). Objective  Body mass index is 30.01 kg/m. Wt Readings from Last 3 Encounters:  01/24/18 148 lb 9.6 oz (67.4 kg)  11/17/17 149 lb (67.6 kg)  10/08/17 154 lb 6.4 oz (70 kg)   Temp Readings from Last 3 Encounters:  01/24/18 98.3 F (36.8 C) (Oral)  11/17/17 98.4 F (36.9 C) (Oral)  10/08/17 98.1 F (36.7 C) (Oral)   BP Readings from Last 3 Encounters:  01/24/18 134/66  11/17/17 (!) 160/96  10/08/17 126/78   Pulse Readings from Last 3 Encounters:  01/24/18 86  11/17/17 90  10/08/17 79    Physical Exam  Constitutional: She is oriented to person, place, and time. Vital signs are normal. She appears well-developed and well-nourished. She is cooperative.  HENT:  Head: Normocephalic and atraumatic.  Mouth/Throat: Oropharynx is clear and moist and mucous membranes are normal.  Eyes: Pupils are equal, round, and reactive to light. Conjunctivae are normal.  Cardiovascular: Normal rate, regular rhythm and normal heart sounds.  Pulmonary/Chest: Effort normal and breath sounds normal.  Neurological: She is alert and oriented to person, place, and time. Gait normal.  Skin: Skin is warm, dry and intact.  Psychiatric: She has a normal mood and affect. Her speech is normal and behavior is normal. Judgment and thought content normal. Cognition and memory are normal.  Nursing note and vitals reviewed.   Assessment   1. Restless leg syndrome vs related to chronic back issues I.e lumbar radiculopathy Plan   1. Trial of gabapentin 100 mg qhs could consider titrate dose up in future I.e 300 mg can disc with  PCP in future    Provider: Dr. French Ana McLean-Scocuzza-[facility]

## 2018-01-24 NOTE — Progress Notes (Signed)
Pre visit review using our clinic review tool, if applicable. No additional management support is needed unless otherwise documented below in the visit note. 

## 2018-01-24 NOTE — Patient Instructions (Signed)

## 2018-02-05 DIAGNOSIS — Z961 Presence of intraocular lens: Secondary | ICD-10-CM | POA: Diagnosis not present

## 2018-02-09 ENCOUNTER — Other Ambulatory Visit: Payer: Self-pay | Admitting: Family Medicine

## 2018-02-18 ENCOUNTER — Ambulatory Visit: Payer: Self-pay

## 2018-02-18 NOTE — Telephone Encounter (Signed)
Patient called in with c/o "dizziness." She says "I have an appointment tomorrow with an ENT, so I will just wait until then to discuss it with that doctor."

## 2018-02-19 ENCOUNTER — Ambulatory Visit (INDEPENDENT_AMBULATORY_CARE_PROVIDER_SITE_OTHER): Payer: Medicare HMO | Admitting: Family Medicine

## 2018-02-19 ENCOUNTER — Ambulatory Visit: Payer: Self-pay | Admitting: *Deleted

## 2018-02-19 ENCOUNTER — Encounter: Payer: Self-pay | Admitting: Family Medicine

## 2018-02-19 VITALS — BP 134/74 | HR 80 | Temp 98.2°F | Resp 15 | Wt 149.2 lb

## 2018-02-19 DIAGNOSIS — R42 Dizziness and giddiness: Secondary | ICD-10-CM

## 2018-02-19 DIAGNOSIS — I10 Essential (primary) hypertension: Secondary | ICD-10-CM | POA: Diagnosis not present

## 2018-02-19 DIAGNOSIS — H6122 Impacted cerumen, left ear: Secondary | ICD-10-CM | POA: Diagnosis not present

## 2018-02-19 DIAGNOSIS — I951 Orthostatic hypotension: Secondary | ICD-10-CM | POA: Diagnosis not present

## 2018-02-19 DIAGNOSIS — H903 Sensorineural hearing loss, bilateral: Secondary | ICD-10-CM | POA: Diagnosis not present

## 2018-02-19 NOTE — Telephone Encounter (Signed)
She should be evaluated in the office for this by me or another provider.  She could see Raynelle FanningJulie for evaluation if needed.

## 2018-02-19 NOTE — Patient Instructions (Addendum)
You will be contacted about the carotid doppler testing by [name].   Please decrease  hydrochlorothiazide by one half for one week and follow up for a blood pressure recheck.  Please monitor your blood pressure, document readings, and follow up sooner if blood pressure average is >140/90.  Dizziness Dizziness is a common problem. It makes you feel unsteady or light-headed. You may feel like you are about to pass out (faint). Dizziness can lead to getting hurt if you stumble or fall. Dizziness can be caused by many things, including:  Medicines.  Not having enough water in your body (dehydration).  Illness.  Follow these instructions at home: Eating and drinking  Drink enough fluid to keep your pee (urine) clear or pale yellow. This helps to keep you from getting dehydrated. Try to drink more clear fluids, such as water.  Do not drink alcohol.  Limit how much caffeine you drink or eat, if your doctor tells you to do that.  Limit how much salt (sodium) you drink or eat, if your doctor tells you to do that. Activity  Avoid making quick movements. ? When you stand up from sitting in a chair, steady yourself until you feel okay. ? In the morning, first sit up on the side of the bed. When you feel okay, stand slowly while you hold onto something. Do this until you know that your balance is fine.  If you need to stand in one place for a long time, move your legs often. Tighten and relax the muscles in your legs while you are standing.  Do not drive or use heavy machinery if you feel dizzy.  Avoid bending down if you feel dizzy. Place items in your home so you can reach them easily without leaning over. Lifestyle  Do not use any products that contain nicotine or tobacco, such as cigarettes and e-cigarettes. If you need help quitting, ask your doctor.  Try to lower your stress level. You can do this by using methods such as yoga or meditation. Talk with your doctor if you need  help. General instructions  Watch your dizziness for any changes.  Take over-the-counter and prescription medicines only as told by your doctor. Talk with your doctor if you think that you are dizzy because of a medicine that you are taking.  Tell a friend or a family member that you are feeling dizzy. If he or she notices any changes in your behavior, have this person call your doctor.  Keep all follow-up visits as told by your doctor. This is important. Contact a doctor if:  Your dizziness does not go away.  Your dizziness or light-headedness gets worse.  You feel sick to your stomach (nauseous).  You have trouble hearing.  You have new symptoms.  You are unsteady on your feet.  You feel like the room is spinning. Get help right away if:  You throw up (vomit) or have watery poop (diarrhea), and you cannot eat or drink anything.  You have trouble: ? Talking. ? Walking. ? Swallowing. ? Using your arms, hands, or legs.  You feel generally weak.  You are not thinking clearly, or you have trouble forming sentences. A friend or family member may notice this.  You have: ? Chest pain. ? Pain in your belly (abdomen). ? Shortness of breath. ? Sweating.  Your vision changes.  You are bleeding.  You have a very bad headache.  You have neck pain or a stiff neck.  You have a  fever. These symptoms may be an emergency. Do not wait to see if the symptoms will go away. Get medical help right away. Call your local emergency services (911 in the [place] Do not drive yourself to the hospital. Summary  Dizziness makes you feel unsteady or light-headed. You may feel like you are about to pass out (faint).  Drink enough fluid to keep your pee (urine) clear or pale yellow. Do not drink alcohol.  Avoid making quick movements if you feel dizzy.  Watch your dizziness for any changes. This information is not intended to replace advice given to you by your health care provider.  Make sure you discuss any questions you have with your health care provider. Document Released: 07/13/2011 Document Revised: 08/10/2016 Document Reviewed: 08/10/2016 Elsevier Interactive Patient Education  2017 [employer]   Orthostatic Hypotension Orthostatic hypotension is a sudden drop in blood pressure that happens when you quickly change positions, such as when you get up from a seated or lying position. Blood pressure is a measurement of how strongly, or weakly, your blood is pressing against the walls of your arteries. Arteries are blood vessels that carry blood from your heart throughout your body. When blood pressure is too low, you may not get enough blood to your brain or to the rest of your organs. This can cause weakness, light-headedness, rapid heartbeat, and fainting. This can last for just a few seconds or for up to a few minutes. Orthostatic hypotension is usually not a serious problem. However, if it happens frequently or gets worse, it may be a sign of something more serious. What are the causes? This condition may be caused by:  Sudden changes in posture, such as standing up quickly after you have been sitting or lying down.  Blood loss.  Loss of body fluids (dehydration).  Heart problems.  Hormone (endocrine) problems.  Pregnancy.  Severe infection.  Lack of certain nutrients.  Severe allergic reactions (anaphylaxis).  Certain medicines, such as blood pressure medicine or medicines that make the body lose excess fluids (diuretics). Sometimes, this condition can be caused by not taking medicine as directed, such as taking too much of a certain medicine.  What increases the risk? Certain factors can make you more likely to develop orthostatic hypotension, including:  Age. Risk increases as you get older.  Conditions that affect the heart or the central nervous system.  Taking certain medicines, such as blood pressure medicine or diuretics.  Being  pregnant.  What are the signs or symptoms? Symptoms of this condition may include:  Weakness.  Light-headedness.  Dizziness.  Blurred vision.  Fatigue.  Rapid heartbeat.  Fainting, in severe cases.  How is this diagnosed? This condition is diagnosed based on:  Your medical history.  Your symptoms.  Your blood pressure measurement. Your health care provider will check your blood pressure when you are: ? Lying down. ? Sitting. ? Standing.  A blood pressure reading is recorded as two numbers, such as "120 over 80" (or 120/80). The first ("top") number is called the systolic pressure. It is a measure of the pressure in your arteries as your heart beats. The second ("bottom") number is called the diastolic pressure. It is a measure of the pressure in your arteries when your heart relaxes between beats. Blood pressure is measured in a unit called mm Hg. Healthy blood pressure for adults is 120/80. If your blood pressure is below 90/60, you may be diagnosed with hypotension. Other information or tests that may be  used to diagnose orthostatic hypotension include:  Your other vital signs, such as your heart rate and temperature.  Blood tests.  Tilt table test. For this test, you will be safely secured to a table that moves you from a lying position to an upright position. Your heart rhythm and blood pressure will be monitored during the test.  How is this treated? Treatment for this condition may include:  Changing your diet. This may involve eating more salt (sodium) or drinking more water.  Taking medicines to raise your blood pressure.  Changing the dosage of certain medicines you are taking that might be lowering your blood pressure.  Wearing compression stockings. These stockings help to prevent blood clots and reduce swelling in your legs.  In some cases, you may need to go to the hospital for:  Fluid replacement. This means you will receive fluids through an IV  tube.  Blood replacement. This means you will receive donated blood through an IV tube (transfusion).  Treating an infection or heart problems, if this applies.  Monitoring. You may need to be monitored while medicines that you are taking wear off.  Follow these instructions at home: Eating and drinking   Drink enough fluid to keep your urine clear or pale yellow.  Eat a healthy diet and follow instructions from your health care provider about eating or drinking restrictions. A healthy diet includes: ? Fresh fruits and vegetables. ? Whole grains. ? Lean meats. ? Low-fat dairy products.  Eat extra salt only as directed. Do not add extra salt to your diet unless your health care provider told you to do that.  Eat frequent, small meals.  Avoid standing up suddenly after eating. Medicines  Take over-the-counter and prescription medicines only as told by your health care provider. ? Follow instructions from your health care provider about changing the dosage of your current medicines, if this applies. ? Do not stop or adjust any of your medicines on your own. General instructions  Wear compression stockings as told by your health care provider.  Get up slowly from lying down or sitting positions. This gives your blood pressure a chance to adjust.  Avoid hot showers and excessive heat as directed by your health care provider.  Return to your normal activities as told by your health care provider. Ask your health care provider what activities are safe for you.  Do not use any products that contain nicotine or tobacco, such as cigarettes and e-cigarettes. If you need help quitting, ask your health care provider.  Keep all follow-up visits as told by your health care provider. This is important. Contact a health care provider if:  You vomit.  You have diarrhea.  You have a fever for more than 2-3 days.  You feel more thirsty than usual.  You feel weak and tired. Get help  right away if:  You have chest pain.  You have a fast or irregular heartbeat.  You develop numbness in any part of your body.  You cannot move your arms or your legs.  You have trouble speaking.  You become sweaty or feel lightheaded.  You faint.  You feel short of breath.  You have trouble staying awake.  You feel confused. This information is not intended to replace advice given to you by your health care provider. Make sure you discuss any questions you have with your health care provider. Document Released: 07/14/2002 Document Revised: 04/11/2016 Document Reviewed: 01/14/2016 Elsevier Interactive Patient Education  2018 [employer].

## 2018-02-19 NOTE — Telephone Encounter (Signed)
Patient is dizzy and lightheaded on and off  Patient is calling to report that she has been experiencing dizziness that she thought was related to vertigo. She was sen today at ENT and that was ruled out.They are concerned that patient may have a circulation issue  Reason for Disposition . [1] MODERATE dizziness (e.g., interferes with normal activities) AND [2] has NOT been evaluated by physician for this  (Exception: dizziness caused by heat exposure, sudden standing, or poor fluid intake)  Answer Assessment - Initial Assessment Questions 1. DESCRIPTION: "Describe your dizziness."     Head feels fun at times and patient gets dizzy when she is looking down at work 2. LIGHTHEADED: "Do you feel lightheaded?" (e.g., somewhat faint, woozy, weak upon standing)     Sometimes the room spins a little 3. VERTIGO: "Do you feel like either you or the room is spinning or tilting?" (i.e. vertigo)     Patient was checked for vertigo today at ENT today- negative 4. SEVERITY: "How bad is it?"  "Do you feel like you are going to faint?" "Can you stand and walk?"   - MILD - walking normally   - MODERATE - interferes with normal activities (e.g., work, school)    - SEVERE - unable to stand, requires support to walk, feels like passing out now.      moderate 5. ONSET:  "When did the dizziness begin?"     1 week 6. AGGRAVATING FACTORS: "Does anything make it worse?" (e.g., standing, change in head position)     Looking down at work- changing position in bed- laying head down on pillow- esp on R 7. HEART RATE: "Can you tell me your heart rate?" "How many beats in 15 seconds?"  (Note: not all patients can do this)       [id number] 8. CAUSE: "What do you think is causing the dizziness?"     Blood flow ? 9. RECURRENT SYMPTOM: "Have you had dizziness before?" If so, ask: "When was the last time?" "What happened that time?"     Yes- several years ago- inner ear treatment 10. OTHER SYMPTOMS: "Do you have any other  symptoms?" (e.g., fever, chest pain, vomiting, diarrhea, bleeding)       no 11. PREGNANCY: "Is there any chance you are pregnant?" "When was your last menstrual period?"       n/a  Protocols used: [facility]- LIGHTHEADEDNESS-A-AH

## 2018-02-19 NOTE — Progress Notes (Signed)
Subjective:    Patient ID: Ashley [name], female    DOB: [birth year], 77 y.o.   MRN: [id number]  HPI  Ashley [name] is a 77 year old female who presents today with dizziness that has been present for one week. She reports that moving head to her right side or bending over to pick up something off of the floor exacerbates symptoms. She also states that getting up quickly from a lying or sitting position can cause dizziness. Today, she saw her ENT today for evaluation of vertigo as she reports a remote history of vertigo and symptom feels very similiar. She states that the ENT evaluation today did not indicate vertigo.  Presyncope/syncope associated: No Evaluation of provoking factors that can cause symptoms below: Change in head position: Turning to the right side can cause symptoms. Ear pressure: No Coughing/sneezing: No Valsalva: No Occur with standing/getting out of bed: Yes Lying down/rolling over in bed/bending neck: No Hearing loss: No  Denies N/V, HA, change in vision, numbness, weakness, abnormal coordination/gait, chest pain, jaw pain, arm pain, or diaphoresis.  History of cardiac dysfunction: No per patient. She was evaluated by cardiology 08/13/17 for jaw pain which was thought to be atypical jaw pain per cardiology. She was noted to have risk factors for CAD; a pharmacologic nuclear stress test was ordered. Results were normal.   She ts a history of HTN, GERD, Chronic back pain, HLD, and remote history of vertigo per patient.  She drinks water only when taking medications. Otherwise she is drinking tea throughout the day and also 2 to 3 cups of coffee.   Review of Systems  Constitutional: Negative for chills, fatigue and fever.  Eyes: Negative for visual disturbance.  Respiratory: Negative for cough, shortness of breath and wheezing.   Cardiovascular: Negative for chest pain and palpitations.  Gastrointestinal: Negative for abdominal pain, diarrhea, nausea and vomiting.    Genitourinary: Negative for dysuria.  Musculoskeletal: Negative for myalgias.  Skin: Negative for rash.  Neurological: Positive for dizziness. Negative for weakness, light-headedness and headaches.  Psychiatric/Behavioral:       Denies depressed or anxious mood   Past Medical History:  Diagnosis Date  . Abnormal uterine bleeding (AUB)    a. benign endometrial polyps on D+C in 03/2017  . Anxiety   . Arthritis   . Chicken pox   . Depression   . GERD (gastroesophageal reflux disease)   . History of vertigo   . Hyperlipidemia   . Hypertension      Social History   Socioeconomic History  . Marital status: Married    Spouse name: Not on file  . Number of children: Not on file  . Years of education: Not on file  . Highest education level: Not on file  Occupational History  . Not on file  Social Needs  . Financial resource strain: Not on file  . Food insecurity:    Worry: Not on file    Inability: Not on file  . Transportation needs:    Medical: Not on file    Non-medical: Not on file  Tobacco Use  . Smoking status: Former Smoker    Packs/day: 0.50    Years: 15.00    Pack years: 7.50    Types: Cigarettes    Last attempt to quit: 08/07/1976    Years since quitting: 41.5  . Smokeless tobacco: Never Used  Substance and Sexual Activity  . Alcohol use: Yes    Comment: ocassionally  . Drug use: No  .  Sexual activity: Not on file  Lifestyle  . Physical activity:    Days per week: Not on file    Minutes per session: Not on file  . Stress: Not on file  Relationships  . Social connections:    Talks on phone: Not on file    Gets together: Not on file    Attends religious service: Not on file    Active member of club or organization: Not on file    Attends meetings of clubs or organizations: Not on file    Relationship status: Not on file  . Intimate partner violence:    Fear of current or ex partner: Not on file    Emotionally abused: Not on file    Physically abused:  Not on file    Forced sexual activity: Not on file  Other Topics Concern  . Not on file  Social History Narrative   Married     Past Surgical History:  Procedure Laterality Date  . DILATATION & CURETTAGE/HYSTEROSCOPY WITH MYOSURE N/A 03/30/2017   Procedure: DILATATION & CURETTAGE/HYSTEROSCOPY WITH MYOSURE;  Surgeon: Schermerhorn, Ihor Austin, MD;  Location: [facility] ORS;  Service: Gynecology;  Laterality: N/A;  . EYE SURGERY Bilateral    Cataract Extraction with IOL  . SKIN LESION EXCISION    . TONSILLECTOMY AND ADENOIDECTOMY     Age 52    Family History  Problem Relation Age of Onset  . Rheum arthritis Mother   . Ovarian cancer Sister   . COPD Sister   . CAD Sister   . Heart disease Father   . Aneurysm Father   . Cancer Brother   . Thrombosis Maternal Grandmother        a. DVT  . Thrombosis Maternal Grandfather        a. DVT  . Thrombosis Paternal Grandmother        a. DVT  . Thrombosis Paternal Grandfather        a. DVT    Allergies  Allergen Reactions  . Ace Inhibitors     Angioedema  . Pravastatin Other (See Comments)    Other reaction(s): Muscle Pain  . Simvastatin Other (See Comments)    stiffness    Current Outpatient Medications on File Prior to Visit  Medication Sig Dispense Refill  . aspirin EC 81 MG tablet Take 81 mg by mouth daily.    Marland Kitchen estradiol (ESTRACE) 1 MG tablet take 2 tablets by mouth every morning    . gabapentin (NEURONTIN) 100 MG capsule Take 1 capsule (100 mg total) by mouth at bedtime. 90 capsule 1  . hydrochlorothiazide (HYDRODIURIL) 25 MG tablet Take 1 tablet (25 mg total) by mouth daily. 30 tablet 1  . ibuprofen (ADVIL,MOTRIN) 200 MG tablet Take 200 mg by mouth every 4 (four) hours as needed.    Marland Kitchen omeprazole (PRILOSEC) 20 MG capsule TAKE 1 CAPSULE BY MOUTH EVERY OTHER DAY 15 capsule 0  . progesterone (PROMETRIUM) 100 MG capsule Take 100 mg by mouth daily.    . rosuvastatin (CRESTOR) 20 MG tablet Take 1 tablet (20 mg total) by mouth daily. 90  tablet 3  . tiZANidine (ZANAFLEX) 2 MG tablet Take 1 tablet (2 mg total) by mouth 3 (three) times daily. (Patient taking differently: Take 2 mg by mouth as needed. ) 30 tablet 0  . traMADol (ULTRAM) 50 MG tablet Take 50 mg by mouth every 6 (six) hours as needed.    . venlafaxine XR (EFFEXOR-XR) 150 MG 24 hr capsule TAKE 1  CAPSULE BY MOUTH DAILY 90 capsule 2   No current facility-administered medications on file prior to visit.     BP 134/74 (BP Location: Left Arm, Patient Position: Standing, Cuff Size: Normal)   Pulse 80   Temp 98.2 F (36.8 C) (Oral)   Resp 15   Wt 149 lb 4 oz (67.7 kg)   SpO2 96%   BMI 30.14 kg/m       Objective:   Physical Exam  Constitutional: She is oriented to person, place, and time. She appears well-developed and well-nourished.  HENT:  Right Ear: Tympanic membrane normal.  Left Ear: Tympanic membrane normal.  Nose: Right sinus exhibits no maxillary sinus tenderness and no frontal sinus tenderness. Left sinus exhibits no maxillary sinus tenderness and no frontal sinus tenderness.  Mouth/Throat: Oropharynx is clear and moist and mucous membranes are normal.  Eyes: Pupils are equal, round, and reactive to light. No scleral icterus.  Neck: Neck supple.  Cardiovascular: Normal rate, regular rhythm, normal heart sounds and intact distal pulses.  Pulmonary/Chest: Effort normal and breath sounds normal. She has no wheezes. She has no rales.  Abdominal: Soft. Bowel sounds are normal. There is no tenderness.  Musculoskeletal: She exhibits no edema.  Lymphadenopathy:    She has no cervical adenopathy.  Neurological: She is alert and oriented to person, place, and time.  II-Visual fields grossly intact. III/IV/VI-Extraocular movements intact. Pupils reactive bilaterally. V/VII-Smile symmetric, equal eyebrow raise, facial sensation intact VIII- Hearing grossly intact XI-bilateral shoulder shrug XII-midline tongue extension Motor: 5/5 bilaterally with normal  tone and bulk Cerebellar: Normal finger-to-nose .  Romberg negative Ambulates with a coordinated gait   Skin: Skin is warm and dry. Capillary refill takes less than 2 seconds.      Assessment & Plan:  1. Orthostatic hypotension Orthostatic changes noted today: Lying: 150/90; Sitting 120/84; Standing 132/74; She experienced "mild dizziness" with BP checks today. We discussed the importance of hydration including increasing water and decreasing tea intake which can be a likely source of symptoms. She is currently taking HCTZ 25 mg daily which may also likely be contributing to her symptoms. We discussed decreasing this by half, documenting blood pressure readings, monitoring for symptoms, and following up in one week for evaluation of BP.   Further measures discussed such as getting up slowly from a lying or seated position and avoidance of quick movements. We also reviewed the importance of moving her legs frequently as she can tighten and relax the muscles in her legs.  She will trial decreasing HCTZ and monitoring of BP and follow up in one week or sooner if needed. We further discussed lab work that can be obtained if no improvement or worsening symptoms such as CMP and also that HCTZ may need to be discontinued.   2. Dizziness Neuro exam WNL today; ENT evaluation did not reveal vertigo as source of dizziness. Symptom reproducible with orthostatic changes and was not present during the exam otherwise. Will address orthostatic symptoms and she will monitor symptom for improvement. With symptom noted in history with movement of head, we discussed further evaluation of carotid US today. She would like to proceed with this plan. Imaging can also be considered for worsening symptoms. Close return precautions advised.   - US Carotid Duplex Bilateral; Future  Roddie McJulia Kordsmeier, FNP-C

## 2018-02-19 NOTE — Telephone Encounter (Signed)
Tried to reach patient by phone no answer voicemail is full will continue to call.

## 2018-02-20 ENCOUNTER — Telehealth: Payer: Self-pay | Admitting: Cardiovascular Disease

## 2018-02-20 NOTE — Telephone Encounter (Signed)
Patient calling Patient is having dizziness issues and states her primary care is wanting her to do a carotid ultrasound  Would like to know if we could order one so she could possibly be seen sooner Please call to discuss

## 2018-02-21 ENCOUNTER — Ambulatory Visit: Payer: Medicare HMO

## 2018-02-21 NOTE — Telephone Encounter (Signed)
Returned the call to the patient. She stated that she is having a carotid doppler on Monday. She wanted to know if she could come have one done at the office sooner. She has been advised to keep her appointment on Monday for her doppler. She verbalized her understanding.

## 2018-02-25 ENCOUNTER — Ambulatory Visit
Admission: RE | Admit: 2018-02-25 | Discharge: 2018-02-25 | Disposition: A | Discharge status: Home / Self Care | Payer: Medicare HMO | Source: Ambulatory Visit | Attending: Family Medicine | Admitting: Family Medicine

## 2018-02-25 DIAGNOSIS — R42 Dizziness and giddiness: Secondary | ICD-10-CM | POA: Diagnosis not present

## 2018-02-25 DIAGNOSIS — I6523 Occlusion and stenosis of bilateral carotid arteries: Secondary | ICD-10-CM | POA: Diagnosis not present

## 2018-02-25 NOTE — Telephone Encounter (Signed)
Saw NO 02/19/18

## 2018-02-27 ENCOUNTER — Telehealth: Payer: Self-pay | Admitting: Family Medicine

## 2018-02-27 NOTE — Telephone Encounter (Signed)
Copied from CRM [id number]. Topic: Quick Communication - Lab Results >> Feb 27, 2018 12:53 PM Bare, Hale DroneKristen C, CMA wrote: Called patient to inform them of  lab results. When patient returns call, triage nurse may disclose results. >> Feb 27, 2018  1:09 PM Floria RavelingStovall, Shana A wrote: Pt called back for labs, all nurses on the other line >> Feb 27, 2018  3:35 PM Burchel, Abbi R wrote: Pt returned call.  >> Feb 27, 2018  4:19 PM Gean BirchwoodWilliams-Neal, Sade R wrote: Pt is calling in requesting lab results

## 2018-02-28 NOTE — Telephone Encounter (Signed)
See lab result for information provided to patient.

## 2018-03-01 ENCOUNTER — Telehealth: Payer: Self-pay | Admitting: Family Medicine

## 2018-03-01 ENCOUNTER — Ambulatory Visit: Payer: Medicare HMO

## 2018-03-01 NOTE — Telephone Encounter (Signed)
Copied from CRM [id number]. Topic: Quick Communication - See Telephone Encounter >> Mar 01, 2018 12:21 PM Lorrine KinMcGee, Demi B, NT wrote: CRM for notification. See Telephone encounter for: 03/01/18. Patient calling and states that she would like to see if Dr Birdie SonsSonnenberg could call [facility] and see if that could help fast track her to get an appointment. Could not see her until 04/11/18  CB#: [phone]

## 2018-03-01 NOTE — Telephone Encounter (Signed)
Please advise 

## 2018-03-04 NOTE — Telephone Encounter (Signed)
Please follow-up with the patient to see how she is feeling now and see if her symptoms have improved with the medication change of decreasing her hydrochlorothiazide.  We could have her follow-up in the office with myself or Raynelle FanningJulie if she is continuing to have symptoms to help determine if she needs to see cardiology sooner.

## 2018-03-04 NOTE — Telephone Encounter (Signed)
Patient states she feels no different. She states she had an ultrasound of her neck and they found plaque and she states that is what is causing the dizziness. She states it is when she moves her neck a certain way that is when she has the dizziness. Patient would like to have her appointment moved up if possible with cardiology.

## 2018-03-04 NOTE — Telephone Encounter (Signed)
I can forward to [name] to see if she can check into moving the patient's appointment up.  Her ultrasound did reveal a small amount of plaque though did not reveal any hemodynamically significant stenosis in either internal carotid artery.  Based on this it would be less likely that the plaque is contributing to her symptoms.

## 2018-03-04 NOTE — Telephone Encounter (Signed)
I recommended a decrease in HCTZ with a follow up for a BP check in one week with the orthostatic changes that were present. I agree with your previous comment relating to how she is doing and if symptoms have improved with medication changes. A BP recheck would be my first approach unless symptoms have worsened. I believe she is scheduled for a 30 minute follow up with you on 03/18/18. If you would like me to reevaluate her also, I am happy to do so.

## 2018-03-04 NOTE — Telephone Encounter (Signed)
I will forward to Raynelle FanningJulie to see if she thinks the patient needs to be evaluated by cardiology sooner than September. I did not see her for this so I need input from the provider that saw her in the office. Please also see if he symptoms have improved with the medication changes that Raynelle FanningJulie advised in the office. Thanks.

## 2018-03-05 NOTE — Telephone Encounter (Signed)
Please relay [name]'s message and my message to the patient. If she would like we could have her come back to our office to recheck with Raynelle FanningJulie or myself if there are available appointments.

## 2018-03-05 NOTE — Telephone Encounter (Signed)
As of right now they do not have any sooner appts available. She has been placed on a wait list with them. They will give her a call if someone cancels.

## 2018-03-05 NOTE — Telephone Encounter (Signed)
Left message to return call, ok for pec to speak to patient and notify her of message below 

## 2018-03-06 ENCOUNTER — Other Ambulatory Visit: Payer: Self-pay | Admitting: Family Medicine

## 2018-03-07 NOTE — Telephone Encounter (Signed)
Patient notified and is scheduled for a follow up with pcp

## 2018-03-09 DIAGNOSIS — M25512 Pain in left shoulder: Secondary | ICD-10-CM | POA: Diagnosis not present

## 2018-03-09 DIAGNOSIS — S4992XA Unspecified injury of left shoulder and upper arm, initial encounter: Secondary | ICD-10-CM | POA: Diagnosis not present

## 2018-03-09 DIAGNOSIS — W01190A Fall on same level from slipping, tripping and stumbling with subsequent striking against furniture, initial encounter: Secondary | ICD-10-CM | POA: Diagnosis not present

## 2018-03-09 DIAGNOSIS — S43402A Unspecified sprain of left shoulder joint, initial encounter: Secondary | ICD-10-CM | POA: Diagnosis not present

## 2018-03-11 ENCOUNTER — Ambulatory Visit (INDEPENDENT_AMBULATORY_CARE_PROVIDER_SITE_OTHER): Payer: Medicare HMO | Admitting: Internal Medicine

## 2018-03-11 ENCOUNTER — Ambulatory Visit (INDEPENDENT_AMBULATORY_CARE_PROVIDER_SITE_OTHER): Payer: Medicare HMO

## 2018-03-11 ENCOUNTER — Encounter: Payer: Self-pay | Admitting: Internal Medicine

## 2018-03-11 ENCOUNTER — Ambulatory Visit: Payer: Medicare HMO | Admitting: Family Medicine

## 2018-03-11 VITALS — BP 124/86 | HR 78 | Temp 98.2°F | Ht 59.0 in | Wt 149.0 lb

## 2018-03-11 DIAGNOSIS — M5412 Radiculopathy, cervical region: Secondary | ICD-10-CM

## 2018-03-11 DIAGNOSIS — M79602 Pain in left arm: Secondary | ICD-10-CM

## 2018-03-11 DIAGNOSIS — R42 Dizziness and giddiness: Secondary | ICD-10-CM | POA: Diagnosis not present

## 2018-03-11 DIAGNOSIS — R269 Unspecified abnormalities of gait and mobility: Secondary | ICD-10-CM

## 2018-03-11 DIAGNOSIS — M25522 Pain in left elbow: Secondary | ICD-10-CM | POA: Diagnosis not present

## 2018-03-11 DIAGNOSIS — S59912A Unspecified injury of left forearm, initial encounter: Secondary | ICD-10-CM | POA: Diagnosis not present

## 2018-03-11 DIAGNOSIS — H8149 Vertigo of central origin, unspecified ear: Secondary | ICD-10-CM | POA: Diagnosis not present

## 2018-03-11 DIAGNOSIS — S4992XA Unspecified injury of left shoulder and upper arm, initial encounter: Secondary | ICD-10-CM | POA: Diagnosis not present

## 2018-03-11 DIAGNOSIS — S59902A Unspecified injury of left elbow, initial encounter: Secondary | ICD-10-CM | POA: Diagnosis not present

## 2018-03-11 DIAGNOSIS — H814 Vertigo of central origin: Secondary | ICD-10-CM

## 2018-03-11 LAB — TROPONIN I: Troponin I: <0.01 ng/mL (ref <=0.0)

## 2018-03-11 MED ORDER — DICLOFENAC SODIUM 1 % TD GEL: 2.0000 g | Freq: Four times a day (QID) | TRANSDERMAL | 0 refills | Status: DC

## 2018-03-11 MED ORDER — DIAZEPAM 5 MG PO TABS: 5.0000 mg | ORAL_TABLET | Freq: Once | ORAL | 0 refills | Status: AC

## 2018-03-11 MED ORDER — TRAMADOL HCL 50 MG PO TABS: 50.0000 mg | ORAL_TABLET | Freq: Two times a day (BID) | ORAL | 0 refills | Status: DC | PRN

## 2018-03-11 NOTE — Patient Instructions (Addendum)
Take zanaflex 2 mg at night as needed Take tramadol as needed for pain  -->with mobic and Tylenol as needed  Use heat to arm  Use topical voltaren gel if this is not approved by insurance try aspercream, bengay or flector patches or salonpas over the counter  F/u with PCP in 1 week     Cervical Radiculopathy Cervical radiculopathy means that a nerve in the neck is pinched or bruised. This can cause pain or loss of feeling (numbness) that runs from your neck to your arm and fingers. Follow these instructions at home: Managing pain  Take over-the-counter and prescription medicines only as told by your doctor.  If directed, put ice on the injured or painful area. ? Put ice in a plastic bag. ? Place a towel between your skin and the bag. ? Leave the ice on for 20 minutes, 2-3 times per day.  If ice does not help, you can try using heat. Take a warm shower or warm bath, or use a heat pack as told by your doctor.  You may try a gentle neck and shoulder massage. Activity  Rest as needed. Follow instructions from your doctor about any activities to avoid.  Do exercises as told by your doctor or physical therapist. General instructions  If you were given a soft collar, wear it as told by your doctor.  Use a flat pillow when you sleep.  Keep all follow-up visits as told by your doctor. This is important. Contact a doctor if:  Your condition does not improve with treatment. Get help right away if:  Your pain gets worse and is not controlled with medicine.  You lose feeling or feel weak in your hand, arm, face, or leg.  You have a fever.  You have a stiff neck.  You cannot control when you poop or pee (have incontinence).  You have trouble with walking, balance, or talking. This information is not intended to replace advice given to you by your health care provider. Make sure you discuss any questions you have with your health care provider. Document Released: 07/13/2011  Document Revised: 12/30/2015 Document Reviewed: 09/17/2014 Elsevier Interactive Patient Education  2018 [employer]  Dizziness Dizziness is a common problem. It is a feeling of unsteadiness or light-headedness. You may feel like you are about to faint. Dizziness can lead to injury if you stumble or fall. Anyone can become dizzy, but dizziness is more common in older adults. This condition can be caused by a number of things, including medicines, dehydration, or illness. Follow these instructions at home: Eating and drinking  Drink enough fluid to keep your urine clear or pale yellow. This helps to keep you from becoming dehydrated. Try to drink more clear fluids, such as water.  Do not drink alcohol.  Limit your caffeine intake if told to do so by your health care provider. Check ingredients and nutrition facts to see if a food or beverage contains caffeine.  Limit your salt (sodium) intake if told to do so by your health care provider. Check ingredients and nutrition facts to see if a food or beverage contains sodium. Activity  Avoid making quick movements. ? Rise slowly from chairs and steady yourself until you feel okay. ? In the morning, first sit up on the side of the bed. When you feel okay, stand slowly while you hold onto something until you know that your balance is fine.  If you need to stand in one place for a long time,  move your legs often. Tighten and relax the muscles in your legs while you are standing.  Do not drive or use heavy machinery if you feel dizzy.  Avoid bending down if you feel dizzy. Place items in your home so that they are easy for you to reach without leaning over. Lifestyle  Do not use any products that contain nicotine or tobacco, such as cigarettes and e-cigarettes. If you need help quitting, ask your health care provider.  Try to reduce your stress level by using methods such as yoga or meditation. Talk with your health care provider if you need  help to manage your stress. General instructions  Watch your dizziness for any changes.  Take over-the-counter and prescription medicines only as told by your health care provider. Talk with your health care provider if you think that your dizziness is caused by a medicine that you are taking.  Tell a friend or a family member that you are feeling dizzy. If he or she notices any changes in your behavior, have this person call your health care provider.  Keep all follow-up visits as told by your health care provider. This is important. Contact a health care provider if:  Your dizziness does not go away.  Your dizziness or light-headedness gets worse.  You feel nauseous.  You have reduced hearing.  You have new symptoms.  You are unsteady on your feet or you feel like the room is spinning. Get help right away if:  You vomit or have diarrhea and are unable to eat or drink anything.  You have problems talking, walking, swallowing, or using your arms, hands, or legs.  You feel generally weak.  You are not thinking clearly or you have trouble forming sentences. It may take a friend or family member to notice this.  You have chest pain, abdominal pain, shortness of breath, or sweating.  Your vision changes.  You have any bleeding.  You have a severe headache.  You have neck pain or a stiff neck.  You have a fever. These symptoms may represent a serious problem that is an emergency. Do not wait to see if the symptoms will go away. Get medical help right away. Call your local emergency services (911 in the [place] Do not drive yourself to the hospital. Summary  Dizziness is a feeling of unsteadiness or light-headedness. This condition can be caused by a number of things, including medicines, dehydration, or illness.  Anyone can become dizzy, but dizziness is more common in older adults.  Drink enough fluid to keep your urine clear or pale yellow. Do not drink  alcohol.  Avoid making quick movements if you feel dizzy. Monitor your dizziness for any changes. This information is not intended to replace advice given to you by your health care provider. Make sure you discuss any questions you have with your health care provider. Document Released: 01/17/2001 Document Revised: 08/26/2016 Document Reviewed: 08/26/2016 Elsevier Interactive Patient Education  [employer].

## 2018-03-11 NOTE — Progress Notes (Signed)
Pre visit review using our clinic review tool, if applicable. No additional management support is needed unless otherwise documented below in the visit note. 

## 2018-03-11 NOTE — Progress Notes (Signed)
Chief Complaint  Patient presents with  . Follow-up    Fall week ago. seen at [facility]   Acute visit  1. C/o fall 03/04/18 tripped on husbands O2 tank and fell on left side not hitting head/neck but hitting left side of body c/o left arm upper pain and elbow pain and forearm pain and not hand pain. She reports after fall her husband had elevated troponin and due to pain on left side she wanted troponin checked to make sure she wasn't having heart attack b/c this is how he presented. She wen to [facility] walk in clinic 03/09/18 with Xray Left shoulder unable to see report. They gave her mobic which is not helping. She does take zanaflex at night She is unable to lift hands overhead and behind back. She denies CP today.  2. Dizziness chronic issue orthostatics prev. And ENT w/u negative in the past. Room Is not spinning. She does drink 1 8 oz water and drinks a lot of tea. She tried cutting hctz in 1/2 pill but did not notice difference so taking 25 mg qd.  3. C/w cervical radiculopathy vs rotation cuff issues s/p fall abnormal CT cervical and MRI 07/2017 and 08/2016 will have her f/u with PCP in future    Review of Systems  Constitutional: Negative for weight loss.  HENT: Negative for hearing loss.   Eyes: Negative for blurred vision.  Cardiovascular: Negative for chest pain.  Musculoskeletal: Positive for falls and joint pain. Negative for neck pain.  Neurological: Positive for dizziness. Negative for headaches.       Denies vertigo    Past Medical History:  Diagnosis Date  . Abnormal uterine bleeding (AUB)    a. benign endometrial polyps on D+C in 03/2017  . Anxiety   . Arthritis   . Chicken pox   . Depression   . GERD (gastroesophageal reflux disease)   . History of vertigo   . Hyperlipidemia   . Hypertension    Past Surgical History:  Procedure Laterality Date  . DILATATION & CURETTAGE/HYSTEROSCOPY WITH MYOSURE N/A 03/30/2017   Procedure: DILATATION & CURETTAGE/HYSTEROSCOPY WITH MYOSURE;   Surgeon: Schermerhorn, Ihor Austin, MD;  Location: [facility] ORS;  Service: Gynecology;  Laterality: N/A;  . EYE SURGERY Bilateral    Cataract Extraction with IOL  . SKIN LESION EXCISION    . TONSILLECTOMY AND ADENOIDECTOMY     Age 26   Family History  Problem Relation Age of Onset  . Rheum arthritis Mother   . Ovarian cancer Sister   . COPD Sister   . CAD Sister   . Heart disease Father   . Aneurysm Father   . Cancer Brother   . Thrombosis Maternal Grandmother        a. DVT  . Thrombosis Maternal Grandfather        a. DVT  . Thrombosis Paternal Grandmother        a. DVT  . Thrombosis Paternal Grandfather        a. DVT   Social History   Socioeconomic History  . Marital status: Married    Spouse name: Not on file  . Number of children: Not on file  . Years of education: Not on file  . Highest education level: Not on file  Occupational History  . Not on file  Social Needs  . Financial resource strain: Not on file  . Food insecurity:    Worry: Not on file    Inability: Not on file  . Transportation needs:  Medical: Not on file    Non-medical: Not on file  Tobacco Use  . Smoking status: Former Smoker    Packs/day: 0.50    Years: 15.00    Pack years: 7.50    Types: Cigarettes    Last attempt to quit: 08/07/1976    Years since quitting: 41.6  . Smokeless tobacco: Never Used  Substance and Sexual Activity  . Alcohol use: Yes    Comment: ocassionally  . Drug use: No  . Sexual activity: Not on file  Lifestyle  . Physical activity:    Days per week: Not on file    Minutes per session: Not on file  . Stress: Not on file  Relationships  . Social connections:    Talks on phone: Not on file    Gets together: Not on file    Attends religious service: Not on file    Active member of club or organization: Not on file    Attends meetings of clubs or organizations: Not on file    Relationship status: Not on file  . Intimate partner violence:    Fear of current or ex  partner: Not on file    Emotionally abused: Not on file    Physically abused: Not on file    Forced sexual activity: Not on file  Other Topics Concern  . Not on file  Social History Narrative   Married    Current Meds  Medication Sig  . aspirin EC 81 MG tablet Take 81 mg by mouth daily.  Marland Kitchen. estradiol (ESTRACE) 1 MG tablet take 2 tablets by mouth every morning  . gabapentin (NEURONTIN) 100 MG capsule Take 1 capsule (100 mg total) by mouth at bedtime.  . hydrochlorothiazide (HYDRODIURIL) 25 MG tablet Take 1 tablet (25 mg total) by mouth daily.  Marland Kitchen. ibuprofen (ADVIL,MOTRIN) 200 MG tablet Take 200 mg by mouth every 4 (four) hours as needed.  . meloxicam (MOBIC) 15 MG tablet Take by mouth.  Marland Kitchen. omeprazole (PRILOSEC) 20 MG capsule TAKE 1 CAPSULE BY MOUTH EVERY OTHER DAY  . progesterone (PROMETRIUM) 100 MG capsule Take 100 mg by mouth daily.  . rosuvastatin (CRESTOR) 20 MG tablet TAKE ONE TABLET BY MOUTH EVERY DAY  . tiZANidine (ZANAFLEX) 2 MG tablet Take 1 tablet (2 mg total) by mouth 3 (three) times daily. (Patient taking differently: Take 2 mg by mouth as needed. )  . traMADol (ULTRAM) 50 MG tablet Take 50 mg by mouth every 6 (six) hours as needed.  . venlafaxine XR (EFFEXOR-XR) 150 MG 24 hr capsule TAKE 1 CAPSULE BY MOUTH DAILY   Allergies  Allergen Reactions  . Ace Inhibitors     Angioedema  . Pravastatin Other (See Comments)    Other reaction(s): Muscle Pain  . Simvastatin Other (See Comments)    stiffness   No results found for this or any previous visit (from the past 2160 hour(s)). Objective  Body mass index is 30.09 kg/m. Wt Readings from Last 3 Encounters:  03/11/18 149 lb (67.6 kg)  02/19/18 149 lb 4 oz (67.7 kg)  01/24/18 148 lb 9.6 oz (67.4 kg)   Temp Readings from Last 3 Encounters:  03/11/18 98.2 F (36.8 C) (Oral)  02/19/18 98.2 F (36.8 C) (Oral)  01/24/18 98.3 F (36.8 C) (Oral)   BP Readings from Last 3 Encounters:  03/11/18 124/86  02/19/18 134/74   01/24/18 134/66   Pulse Readings from Last 3 Encounters:  03/11/18 78  02/19/18 80  01/24/18 86  Physical Exam  Constitutional: She is oriented to person, place, and time. Vital signs are normal. She appears well-developed and well-nourished. She is cooperative.  HENT:  Head: Normocephalic and atraumatic.  Mouth/Throat: Oropharynx is clear and moist and mucous membranes are normal.  Eyes: Pupils are equal, round, and reactive to light. Conjunctivae are normal.  Cardiovascular: Normal rate, regular rhythm and normal heart sounds.  Pulmonary/Chest: Effort normal and breath sounds normal.  Musculoskeletal:       Left elbow: She exhibits swelling. Tenderness found. Medial epicondyle, lateral epicondyle and olecranon process tenderness noted.  Mild elbow ttp  Left shoulder with muscle spasm  Limited rom left arm going behind back and overhead    Neurological: She is alert and oriented to person, place, and time. Gait normal.  CN 2-12 grossly intact moving all 4 ext no neuro deficits    Skin: Skin is warm, dry and intact.  Psychiatric: She has a normal mood and affect. Her speech is normal and behavior is normal. Judgment and thought content normal. Cognition and memory are normal.  Nursing note and vitals reviewed.   Assessment   1. Fall with left humerus, forearm and elbow pain likely MSK  2. Dizziness orthostatics checked negative  Lying 138/88 HR 84 Sitting 130/78 HR 82 Standing 138/80 82  3. C/w acute on chronic exacerbation of cervical radiculopathy with abnormal C spine Xray and MRI in 2018 vs rotator cuff vs msk etiology of #1  Plan   1. Xray left forearm, elbow and humerus today  Prn voltaren gel  See hpi pt c/w MI as husband presented similar sx's and troponin elevated will check trop to reassure today  Prn Zanaflex, tramadol refilled, heat, also can try otc topicals for pain  Get copy of Left shoulder Xray from [facility].  ENT w/u neg per NP note  Reducing hctz to  12.5 did not help dizziness per pt and so taking 25 mg qd again  Check CMET, Trop today r/o MI though less likely cause based on sx's   Orthostatics today  Consider MRI brain ordered with valium before  Increase hydration with water  Does not seem like vertigo will not Rx meclizine   3. Reviewed C MRI and C spine Xray  Consider referral to ortho spine vs NS in future given abnormal MRI C spine vs repeat MRI C spine 1st.     Provider: Dr. French Ana McLean-Scocuzza-Internal Medicine

## 2018-03-12 ENCOUNTER — Other Ambulatory Visit: Payer: Self-pay | Admitting: Family Medicine

## 2018-03-12 LAB — COMPLETE METABOLIC PANEL WITH GFR
AG Ratio: 2 (calc) (ref 1.0–2.5)
ALT: 14 U/L (ref 6–29)
AST: 18 U/L (ref 10–35)
Albumin: 4.5 g/dL (ref 3.6–5.1)
Alkaline phosphatase (APISO): 56 U/L (ref 33–130)
BUN: 10 mg/dL (ref 7–25)
CO2: 28 mmol/L (ref 20–32)
Calcium: 9.5 mg/dL (ref 8.6–10.4)
Chloride: 97 mmol/L — ABNORMAL LOW (ref 98–110)
Creat: 0.69 mg/dL (ref 0.60–0.93)
GFR, Est African American: 99 mL/min/{1.73_m2} (ref >=60)
GFR, Est Non African American: 86 mL/min/{1.73_m2} (ref >=60)
Globulin: 2.3 g/dL (calc) (ref 1.9–3.7)
Glucose, Bld: 90 mg/dL (ref 65–99)
Potassium: 3.8 mmol/L (ref 3.5–5.3)
Sodium: 136 mmol/L (ref 135–146)
Total Bilirubin: 0.3 mg/dL (ref 0.2–1.2)
Total Protein: 6.8 g/dL (ref 6.1–8.1)

## 2018-03-17 ENCOUNTER — Other Ambulatory Visit: Payer: Self-pay | Admitting: Internal Medicine

## 2018-03-17 DIAGNOSIS — M5412 Radiculopathy, cervical region: Secondary | ICD-10-CM

## 2018-03-17 DIAGNOSIS — M79602 Pain in left arm: Secondary | ICD-10-CM

## 2018-03-17 DIAGNOSIS — M25522 Pain in left elbow: Secondary | ICD-10-CM

## 2018-03-18 ENCOUNTER — Ambulatory Visit (INDEPENDENT_AMBULATORY_CARE_PROVIDER_SITE_OTHER): Payer: Medicare HMO | Admitting: Family Medicine

## 2018-03-18 ENCOUNTER — Encounter: Payer: Self-pay | Admitting: Family Medicine

## 2018-03-18 DIAGNOSIS — M79602 Pain in left arm: Secondary | ICD-10-CM | POA: Insufficient documentation

## 2018-03-18 DIAGNOSIS — K219 Gastro-esophageal reflux disease without esophagitis: Secondary | ICD-10-CM

## 2018-03-18 DIAGNOSIS — F329 Major depressive disorder, single episode, unspecified: Secondary | ICD-10-CM | POA: Diagnosis not present

## 2018-03-18 DIAGNOSIS — R42 Dizziness and giddiness: Secondary | ICD-10-CM | POA: Diagnosis not present

## 2018-03-18 DIAGNOSIS — R69 Illness, unspecified: Secondary | ICD-10-CM | POA: Diagnosis not present

## 2018-03-18 DIAGNOSIS — M542 Cervicalgia: Secondary | ICD-10-CM

## 2018-03-18 DIAGNOSIS — F32A Depression, unspecified: Secondary | ICD-10-CM

## 2018-03-18 MED ORDER — OMEPRAZOLE 20 MG PO CPDR
20.0000 mg | DELAYED_RELEASE_CAPSULE | Freq: Every day | ORAL | 3 refills | Status: DC
Start: 2018-03-18 — End: 2018-08-05

## 2018-03-18 NOTE — Progress Notes (Signed)
Marikay AlarEric Sonnenberg, MD Phone: [phone]  Ashley [name] is a 77 y.o. female who presents today for f/u.  CC: GERD, depression, fall, dizziness  GERD:   Reflux symptoms: on the days she does not take medications she will have slight burning and regurgitation   Abd pain: no   Blood in stool: no  Dysphagia: no   EGD: no  Medication: taking omeprazole qod  Depression: She reports no symptoms.  No SI.  Taking Effexor.  Patient recently had a fall and was evaluated at the walk-in clinic and in our office.  Her pain has improved in her arm though she is still a little sore in her tricep muscle.  Improving.  No fractures on x-ray.  She reports no wrist pain.  Dizziness: She reports still getting intermittently dizzy if she tilts her head back and turns her head to the right.  She saw ENT and reports the evaluation was negative.  Reports it is a lightheadedness and not a vertigo symptom.  She has an MRI brain scheduled ordered by Dr. Judie GrieveMclean-Scocuzza.  Neck pain: Still has some on the left.  No radiation, numbness, or weakness.  Previously deferred referral.   Social History   Tobacco Use  Smoking Status Former Smoker  . Packs/day: 0.50  . Years: 15.00  . Pack years: 7.50  . Types: Cigarettes  . Last attempt to quit: 08/07/1976  . Years since quitting: 41.6  Smokeless Tobacco Never Used     ROS see history of present illness  Objective  Physical Exam Vitals:   03/18/18 0958  BP: 130/78  Pulse: 83  Temp: 98.2 F (36.8 C)  SpO2: 95%    BP Readings from Last 3 Encounters:  03/18/18 130/78  03/11/18 124/86  02/19/18 134/74   Wt Readings from Last 3 Encounters:  03/18/18 152 lb (68.9 kg)  03/11/18 149 lb (67.6 kg)  02/19/18 149 lb 4 oz (67.7 kg)    Physical Exam  Constitutional: No distress.  Cardiovascular: Normal rate, regular rhythm and normal heart sounds.  Pulmonary/Chest: Effort normal and breath sounds normal.  Musculoskeletal: She exhibits no edema.  No  midline neck tenderness, no midline neck step-off, no muscular neck tenderness, slight discomfort musculature of left posterior shoulder when it is abducted  Neurological: She is alert.  Skin: Skin is warm and dry. She is not diaphoretic.     Assessment/Plan: Please see individual problem list.  GERD (gastroesophageal reflux disease) She is having some symptoms with the decreased dose of omeprazole.  We will increase to daily dosing.  If not improving she will let us know.  Depression Asymptomatic.  Continue Effexor.  Lightheadedness Reports continued issues with this.  She reports having negative evaluation with ENT.  She will proceed with the MRI ordered by Dr. Judie GrieveMclean-Scocuzza.  Neck pain Essentially stable.  No radicular symptoms.  Neurologically intact in upper and lower extremities.  She will let us know she would like to see a specialist.  Left arm pain Improving status post fall.  She will monitor and if it does not continue to improve she will let us know.   No orders of the defined types were placed in this encounter.   Meds ordered this encounter  Medications  . omeprazole (PRILOSEC) 20 MG capsule    Sig: Take 1 capsule (20 mg total) by mouth daily.    Dispense:  30 capsule    Refill:  3     Marikay AlarEric Sonnenberg, MD [facility]  

## 2018-03-18 NOTE — Patient Instructions (Signed)
Nice to see you. Please start taking your omeprazole daily.  If this is not beneficial please let us know.   If your arm does not continue to improve please let us know. Please proceed with the MRI as ordered by Dr. Judie GrieveMclean-Scocuzza. If you would like to see a specialist for your neck please let us know.

## 2018-03-18 NOTE — Assessment & Plan Note (Signed)
Improving status post fall.  She will monitor and if it does not continue to improve she will let us know.

## 2018-03-18 NOTE — Assessment & Plan Note (Signed)
She is having some symptoms with the decreased dose of omeprazole.  We will increase to daily dosing.  If not improving she will let us know.

## 2018-03-18 NOTE — Assessment & Plan Note (Signed)
Asymptomatic.  Continue Effexor. 

## 2018-03-18 NOTE — Assessment & Plan Note (Signed)
Reports continued issues with this.  She reports having negative evaluation with ENT.  She will proceed with the MRI ordered by Dr. Judie GrieveMclean-Scocuzza.

## 2018-03-18 NOTE — Assessment & Plan Note (Signed)
Essentially stable.  No radicular symptoms.  Neurologically intact in upper and lower extremities.  She will let us know she would like to see a specialist.

## 2018-03-19 ENCOUNTER — Telehealth: Payer: Self-pay

## 2018-03-19 NOTE — Telephone Encounter (Signed)
Copied from CRM [phone]. Topic: General - Other >> Mar 19, 2018  2:04 PM Burchel, Abbi R wrote: Reason for CRM:   [facility] a call back from [facility] re: diclofenac gel  [phone] ok to leave VM  Confirm that appeal was for generic? Has pt tried and failed 2 generic oral NSAIDS in the past?

## 2018-03-19 NOTE — Telephone Encounter (Signed)
Open in error

## 2018-03-20 ENCOUNTER — Telehealth: Payer: Self-pay | Admitting: Family Medicine

## 2018-03-20 ENCOUNTER — Ambulatory Visit: Payer: Medicare HMO | Admitting: Family Medicine

## 2018-03-20 NOTE — Telephone Encounter (Signed)
FYI

## 2018-03-20 NOTE — Telephone Encounter (Signed)
Copied from CRM [id number]. Topic: Quick Communication - See Telephone Encounter >> Mar 20, 2018 10:09 AM Lorrine KinMcGee, Demi B, NT wrote: CRM for notification. See Telephone encounter for: 03/20/18. Heather with [employer] calling to inform office that the diclofenac sodium (VOLTAREN) 1 % GEL has been approved.

## 2018-03-26 ENCOUNTER — Encounter: Payer: Self-pay | Admitting: Internal Medicine

## 2018-03-26 ENCOUNTER — Ambulatory Visit (INDEPENDENT_AMBULATORY_CARE_PROVIDER_SITE_OTHER): Payer: Medicare HMO | Admitting: Internal Medicine

## 2018-03-26 DIAGNOSIS — H8149 Vertigo of central origin, unspecified ear: Secondary | ICD-10-CM

## 2018-03-26 DIAGNOSIS — R42 Dizziness and giddiness: Secondary | ICD-10-CM | POA: Diagnosis not present

## 2018-03-26 DIAGNOSIS — H814 Vertigo of central origin: Secondary | ICD-10-CM

## 2018-03-26 MED ORDER — FLUTICASONE PROPIONATE 50 MCG/ACT NA SUSP: 2.0000 | Freq: Every day | NASAL | 0 refills | Status: DC

## 2018-03-26 MED ORDER — DIAZEPAM 5 MG PO TABS: 5.0000 mg | ORAL_TABLET | Freq: Two times a day (BID) | ORAL | 0 refills | Status: DC | PRN

## 2018-03-26 MED ORDER — PREDNISONE 10 MG PO TABS: ORAL_TABLET | ORAL | 0 refills | Status: DC

## 2018-03-26 NOTE — Progress Notes (Signed)
Subjective:  Patient ID: Ashley [name], female    DOB: 1940/12/04  Age: 77 y.o. MRN: [id number]  CC: Diagnoses of Vertigo of central origin, unspecified laterality and Vertigo were pertinent to this visit.  HPI Ashley [name] presents for follow up on persistent vertigo.  Patient has been having recurrent symptoms since mid July.  occurring in various positions,  Both while lying in bed,  Standing.   HAD NO SYMPTOMS while driving home from beach yesterday,  A 4 hours drive .    Feeling is brought on when she leans her head far back or lies supine on a pillow.  Has seen [facility] , had the table test, hearing test.  with no significant change in symptoms .  Has seen   NP on July 22 carotids ordered  . Then saw  MClean,  Then Sonnenberg.  Orthostatics today are negative   MRi Brain scedheduled for Augus 29   Carotid ultrasounds  Essentially normal.   Normal low risk myoviwe Jan 2019     Outpatient Medications Prior to Visit  Medication Sig Dispense Refill  . aspirin EC 81 MG tablet Take 81 mg by mouth daily.    Marland Kitchen estradiol (ESTRACE) 1 MG tablet take 2 tablets by mouth every morning    . gabapentin (NEURONTIN) 100 MG capsule Take 1 capsule (100 mg total) by mouth at bedtime. 90 capsule 1  . hydrochlorothiazide (HYDRODIURIL) 25 MG tablet Take 1 tablet (25 mg total) by mouth daily. 30 tablet 1  . meloxicam (MOBIC) 15 MG tablet Take by mouth.    Marland Kitchen omeprazole (PRILOSEC) 20 MG capsule Take 1 capsule (20 mg total) by mouth daily. 30 capsule 3  . progesterone (PROMETRIUM) 100 MG capsule Take 100 mg by mouth daily.    . rosuvastatin (CRESTOR) 20 MG tablet TAKE ONE TABLET BY MOUTH EVERY DAY 90 tablet 3  . tiZANidine (ZANAFLEX) 2 MG tablet Take 1 tablet (2 mg total) by mouth 3 (three) times daily. (Patient taking differently: Take 2 mg by mouth as needed. ) 30 tablet 0  . traMADol (ULTRAM) 50 MG tablet Take 1 tablet (50 mg total) by mouth 2 (two) times daily as needed. 10 tablet 0  .  venlafaxine XR (EFFEXOR-XR) 150 MG 24 hr capsule TAKE 1 CAPSULE BY MOUTH DAILY 90 capsule 2   No facility-administered medications prior to visit.     Review of Systems;  Patient denies headache, fevers, malaise, unintentional weight loss, skin rash, eye pain, sinus congestion and sinus pain, sore throat, dysphagia,  hemoptysis , cough, dyspnea, wheezing, chest pain, palpitations, orthopnea, edema, abdominal pain, nausea, melena, diarrhea, constipation, flank pain, dysuria, hematuria, urinary  Frequency, nocturia, numbness, tingling, seizures,  Focal weakness, Loss of consciousness,  Tremor, insomnia, depression, anxiety, and suicidal ideation.      Objective:  BP (!) 144/84 (BP Location: Left Arm, Patient Position: Standing, Cuff Size: Normal)   Pulse 80   Temp 98 F (36.7 C) (Oral)   Resp 15   Wt 151 lb (68.5 kg)   SpO2 97%   BMI 30.50 kg/m   BP Readings from Last 3 Encounters:  03/26/18 (!) 144/84  03/18/18 130/78  03/11/18 124/86    Wt Readings from Last 3 Encounters:  03/26/18 151 lb (68.5 kg)  03/18/18 152 lb (68.9 kg)  03/11/18 149 lb (67.6 kg)    General appearance: alert, cooperative and appears stated age Ears: normal TM's and external ear canals both ears Throat: lips, mucosa, and  tongue normal; teeth and gums normal Neck: no adenopathy, no carotid bruit, supple, symmetrical, trachea midline and thyroid not enlarged, symmetric, no tenderness/mass/nodules Back: symmetric, no curvature. ROM normal. No CVA tenderness. Lungs: clear to auscultation bilaterally Heart: regular rate and rhythm, S1, S2 normal, no murmur, click, rub or gallop Abdomen: soft, non-tender; bowel sounds normal; no masses,  no organomegaly Pulses: 2+ and symmetric Skin: Skin color, texture, turgor normal. No rashes or lesions Lymph nodes: Cervical, supraclavicular, and axillary nodes normal.  Lab Results  Component Value Date   HGBA1C 6.0 10/08/2017   HGBA1C 5.9 04/24/2017    Lab  Results  Component Value Date   CREATININE 0.69 03/11/2018   CREATININE 0.73 10/08/2017   CREATININE 0.69 04/24/2017    Lab Results  Component Value Date   WBC 9.2 10/08/2017   HGB 14.7 10/08/2017   HCT 43.7 10/08/2017   PLT 336.0 10/08/2017   GLUCOSE 90 03/11/2018   CHOL 187 04/24/2017   TRIG 276.0 (H) 04/24/2017   HDL 66.80 04/24/2017   LDLDIRECT 62.0 10/08/2017   ALT 14 03/11/2018   AST 18 03/11/2018   NA 136 03/11/2018   K 3.8 03/11/2018   CL 97 (L) 03/11/2018   CREATININE 0.69 03/11/2018   BUN 10 03/11/2018   CO2 28 03/11/2018   TSH 3.93 10/08/2017   HGBA1C 6.0 10/08/2017    [place] Carotid Duplex Bilateral  Result Date: 02/25/2018 CLINICAL DATA:  Dizziness for the past week. History of hypertension and hyperlipidemia. Former smoker. EXAM: BILATERAL CAROTID DUPLEX ULTRASOUND TECHNIQUE: Wallace CullensGray scale imaging, color Doppler and duplex ultrasound were performed of bilateral carotid and vertebral arteries in the neck. COMPARISON:  None. FINDINGS: Criteria: Quantification of carotid stenosis is based on velocity parameters that correlate the residual internal carotid diameter with NASCET-based stenosis levels, using the diameter of the distal internal carotid lumen as the denominator for stenosis measurement. The following velocity measurements were obtained: RIGHT ICA:  57/10 cm/sec CCA:  97/19 cm/sec SYSTOLIC ICA/CCA RATIO:  0.6 ECA:  59 cm/sec LEFT ICA:  57/11 cm/sec CCA:  108/21 cm/sec SYSTOLIC ICA/CCA RATIO:  0.5 ECA:  88 cm/sec RIGHT CAROTID ARTERY: There is a minimal amount intimal thickening and atherosclerotic plaque within the right carotid bulb (image 17), not resulting in elevated peak systolic velocities within the interrogated course of the right internal carotid artery to suggest a hemodynamically significant stenosis. RIGHT VERTEBRAL ARTERY:  Antegrade Flow LEFT CAROTID ARTERY: There is a minimal amount of intimal thickening atherosclerotic plaque within the left carotid bulb  (image 56), not resulting in elevated peak systolic velocities within the interrogated course the left internal carotid artery to suggest a hemodynamically significant stenosis. LEFT VERTEBRAL ARTERY:  Antegrade flow IMPRESSION: Minimal amount of bilateral intimal thickening and atherosclerotic plaque, left subjectively greater than right, not resulting in a hemodynamically significant stenosis within either internal artery. Electronically Signed   By: Simonne ComeJohn  Watts M.D.   On: 02/25/2018 14:06    Assessment & Plan:   Problem List Items Addressed This Visit    Vertigo    Trial of prednisone  taper,  Steroid nasal spray. MRi brain changed to include IAC's       Other Visit Diagnoses    Vertigo of central origin, unspecified laterality       Relevant Orders   MR BRAIN/IAC W WO CONTRAST      I have changed Ashley [name]'s fluticasone. I am also having her start on predniSONE and diazepam. Additionally, I am having her maintain  her estradiol, hydrochlorothiazide, aspirin EC, tiZANidine, progesterone, venlafaxine XR, gabapentin, rosuvastatin, meloxicam, traMADol, and omeprazole.  Meds ordered this encounter  Medications  . predniSONE (DELTASONE) 10 MG tablet    Sig: 6 tablets on Day 1 , then reduce by 1 tablet daily until gone    Dispense:  21 tablet    Refill:  0  . diazepam (VALIUM) 5 MG tablet    Sig: Take 1 tablet (5 mg total) by mouth every 12 (twelve) hours as needed for anxiety.    Dispense:  20 tablet    Refill:  0  . fluticasone (FLONASE) 50 MCG/ACT nasal spray    Sig: Place 2 sprays into both nostrils daily.    Dispense:  16 g    Refill:  0    There are no discontinued medications.  Follow-up: No follow-ups on file.   Sherlene Shamseresa L Tullo, MD

## 2018-03-26 NOTE — Patient Instructions (Signed)
I am changing the MRI to include the internal auditory canals  (IAC's)     I am prescribing  prednisone  In a tapering dose to take for the next 6 days  You may also use the valium 7529m g every 12 hours if it helps  I would resume the flonase nasal spray as well

## 2018-03-27 NOTE — Assessment & Plan Note (Signed)
Trial of prednisone  taper,  Steroid nasal spray. MRi brain changed to include IAC's

## 2018-04-03 ENCOUNTER — Ambulatory Visit: Payer: Medicare HMO

## 2018-04-04 ENCOUNTER — Ambulatory Visit
Admission: RE | Admit: 2018-04-04 | Discharge: 2018-04-04 | Disposition: A | Discharge status: Home / Self Care | Payer: Medicare HMO | Source: Ambulatory Visit | Attending: Internal Medicine | Admitting: Internal Medicine

## 2018-04-04 DIAGNOSIS — H8149 Vertigo of central origin, unspecified ear: Secondary | ICD-10-CM | POA: Diagnosis present

## 2018-04-04 DIAGNOSIS — R42 Dizziness and giddiness: Secondary | ICD-10-CM | POA: Diagnosis not present

## 2018-04-04 DIAGNOSIS — I6782 Cerebral ischemia: Secondary | ICD-10-CM | POA: Insufficient documentation

## 2018-04-04 MED ORDER — GADOBENATE DIMEGLUMINE 529 MG/ML IV SOLN
15.0000 mL | Freq: Once | INTRAVENOUS | Status: AC | PRN
  Administered 2018-04-04: 14 mL via INTRAVENOUS

## 2018-04-09 ENCOUNTER — Ambulatory Visit: Payer: Medicare HMO | Admitting: Nurse Practitioner

## 2018-04-11 ENCOUNTER — Encounter: Payer: Self-pay | Admitting: Nurse Practitioner

## 2018-04-11 ENCOUNTER — Ambulatory Visit: Payer: Medicare HMO | Admitting: Nurse Practitioner

## 2018-04-11 ENCOUNTER — Encounter

## 2018-04-11 VITALS — BP 120/70 | HR 88 | Ht 59.0 in | Wt 149.0 lb

## 2018-04-11 DIAGNOSIS — R42 Dizziness and giddiness: Secondary | ICD-10-CM | POA: Diagnosis not present

## 2018-04-11 DIAGNOSIS — I10 Essential (primary) hypertension: Secondary | ICD-10-CM

## 2018-04-11 DIAGNOSIS — E782 Mixed hyperlipidemia: Secondary | ICD-10-CM | POA: Diagnosis not present

## 2018-04-11 NOTE — Progress Notes (Signed)
Office Visit    Patient Name: Ashley [name] Date of Encounter: 04/11/2018  Primary Care Provider:  Glori Luis, MD Primary Cardiologist:  Lorine Bears, MD  Chief Complaint    77 year old female with prior history of atypical jaw pain, hypertension, hyperlipidemia, GERD, depression, arthritis, and anxiety who presents for follow-up after recent carotid ultrasound.    Past Medical History    Past Medical History:  Diagnosis Date  . Abnormal uterine bleeding (AUB)    a. benign endometrial polyps on D+C in 03/2017  . Anxiety   . Arthritis   . Chicken pox   . Depression   . GERD (gastroesophageal reflux disease)   . History of stress test    a. 08/2017 Lexiscan MV: EF 55-65%. No ischemia/infarct. Low risk.  Marland Kitchen Hyperlipidemia   . Hypertension   . Lacunar infarction (HCC)    a. Dx in setting of dizziness/vertigo; b. 02/2018 Carotid U/S: bilateral intimal thickening and atherosclerotic plaque. No hemodynamically significant stenoses; c. 03/2018 MRI Brain: No mass or abnl enhancement. Chronic microvascular ischemic changes and mod volume loss of the brain. 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  . Vertigo    Past Surgical History:  Procedure Laterality Date  . DILATATION & CURETTAGE/HYSTEROSCOPY WITH MYOSURE N/A 03/30/2017   Procedure: DILATATION & CURETTAGE/HYSTEROSCOPY WITH MYOSURE;  Surgeon: Schermerhorn, Ihor Austin, MD;  Location: [facility] ORS;  Service: Gynecology;  Laterality: N/A;  . EYE SURGERY Bilateral    Cataract Extraction with IOL  . SKIN LESION EXCISION    . TONSILLECTOMY AND ADENOIDECTOMY     Age 26    Allergies  Allergies  Allergen Reactions  . Ace Inhibitors     Angioedema  . Pravastatin Other (See Comments)    Other reaction(s): Muscle Pain  . Simvastatin Other (See Comments)    stiffness    History of Present Illness    77 year old female with a history of hypertension, hyperlipidemia, GERD, depression, arthritis, and chronic  back pain.  She was previously evaluated in clinic in January of this year secondary to atypical jaw pain that occurred with eating and improved with Gas-X.  She was seen by Dr. Kirke Corin and underwent Lexiscan stress testing, which showed normal LV function without ischemia or infarct.  More recently, she has been evaluated by primary care for vertiginous symptoms and dizziness.  In that setting, she underwent carotid ultrasound in July which showed no hemodynamically significant stenoses.  This was followed by MRI of the brain in August which showed small, chronic left hemi-pons and bilateral thalami lacunar infarcts.  She has follow-up with primary care scheduled.  She made an appointment to follow-up with [place] related to her carotid ultrasound.  Dizziness has improved following treatment with steroids by primary care.  She denies chest pain, palpitations, dyspnea, pnd, orthopnea, n, v, syncope, edema, weight gain, or early satiety.   Home Medications    Prior to Admission medications   Medication Sig Start Date End Date Taking? Authorizing Provider  aspirin EC 81 MG tablet Take 81 mg by mouth daily.    [provider]  diazepam (VALIUM) 5 MG tablet Take 1 tablet (5 mg total) by mouth every 12 (twelve) hours as needed for anxiety. 03/26/18   Sherlene Shams, MD  estradiol (ESTRACE) 1 MG tablet take 2 tablets by mouth every morning 06/10/15   [provider]  fluticasone (FLONASE) 50 MCG/ACT nasal spray Place 2 sprays into both nostrils daily. 03/26/18  Sherlene Shams, MD  gabapentin (NEURONTIN) 100 MG capsule Take 1 capsule (100 mg total) by mouth at bedtime. 01/24/18   McLean-Scocuzza, Pasty Spillers, MD  hydrochlorothiazide (HYDRODIURIL) 25 MG tablet Take 1 tablet (25 mg total) by mouth daily. 04/15/16   Minna Antis, MD  meloxicam (MOBIC) 15 MG tablet Take by mouth. 03/09/18   [provider]  omeprazole (PRILOSEC) 20 MG capsule Take 1 capsule (20 mg total) by mouth daily. 03/18/18    Glori Luis, MD  predniSONE (DELTASONE) 10 MG tablet 6 tablets on Day 1 , then reduce by 1 tablet daily until gone 03/26/18   Sherlene Shams, MD  progesterone (PROMETRIUM) 100 MG capsule Take 100 mg by mouth daily.    [provider]  rosuvastatin (CRESTOR) 20 MG tablet TAKE ONE TABLET BY MOUTH EVERY DAY 03/07/18   Glori Luis, MD  tiZANidine (ZANAFLEX) 2 MG tablet Take 1 tablet (2 mg total) by mouth 3 (three) times daily. Patient taking differently: Take 2 mg by mouth as needed.  07/12/17   Roddie Mc, FNP  traMADol (ULTRAM) 50 MG tablet Take 1 tablet (50 mg total) by mouth 2 (two) times daily as needed. 03/11/18   McLean-Scocuzza, Pasty Spillers, MD  venlafaxine XR (EFFEXOR-XR) 150 MG 24 hr capsule TAKE 1 CAPSULE BY MOUTH DAILY 10/09/17   Glori Luis, MD    Review of Systems    Dizziness has improved.  She denies chest pain, palpitations, dyspnea, pnd, orthopnea, n, v, syncope, edema, weight gain, or early satiety.  All other systems reviewed and are otherwise negative except as noted above.  Physical Exam    VS:  BP 120/70 (BP Location: Left Arm, Patient Position: Sitting, Cuff Size: Normal)   Pulse 88   Ht 4\' 11"  (1.499 m)   Wt 149 lb (67.6 kg)   BMI 30.09 kg/m  , BMI Body mass index is 30.09 kg/m. GEN: Well nourished, well developed, in no acute distress. HEENT: normal. Neck: Supple, no JVD, carotid bruits, or masses. Cardiac: RRR, no murmurs, rubs, or gallops. No clubbing, cyanosis, edema.  Radials/DP/PT 2+ and equal bilaterally.  Respiratory:  Respirations regular and unlabored, clear to auscultation bilaterally. GI: Soft, nontender, nondistended, BS + x 4. MS: no deformity or atrophy. Skin: warm and dry, no rash. Neuro:  Strength and sensation are intact. Psych: Normal affect.  Accessory Clinical Findings    ECG personally reviewed by me today -sinus arrhythmia, 88- no acute changes.  Assessment & Plan    1.  History of atypical jaw pain:  Negative stress testing earlier this year.  No recurrence.  No further evaluation.  2.  Dizziness: She underwent work-up by primary care including carotid ultrasound.  This shows minimal plaquing.  She is on a statin.  MRI showed old lacunar infarcts.  Dizziness resolved following treatment with prednisone.  No cardiac work-up necessary at this time.  3.  Hyperlipidemia: She is on Crestor therapy.  Recommend continuation of the setting of finding of minimal plaquing in the carotids.  She has not had lipids since 2017.  I offered to arrange however, she says she has follow-up with primary care soon and will get this done through their office.  4.  Essential HTN:  Stable.  5.  Disposition: Follow-up as needed.   Nicolasa Ducking, NP 04/11/2018, 5:03 PM

## 2018-04-11 NOTE — Patient Instructions (Signed)
Medication Instructions: Your physician recommends that you continue on your current medications as directed. Please refer to the Current Medication list given to you today.  If you need a refill on your cardiac medications before your next appointment, please call your pharmacy.   Follow-Up: Your physician wants you to follow-up as needed.   Thank you for choosing [facility]!    

## 2018-04-18 DIAGNOSIS — H0264 Xanthelasma of left upper eyelid: Secondary | ICD-10-CM | POA: Diagnosis not present

## 2018-04-18 DIAGNOSIS — L821 Other seborrheic keratosis: Secondary | ICD-10-CM | POA: Diagnosis not present

## 2018-04-19 ENCOUNTER — Ambulatory Visit (INDEPENDENT_AMBULATORY_CARE_PROVIDER_SITE_OTHER): Payer: Medicare HMO

## 2018-04-19 VITALS — BP 118/70 | HR 70 | Temp 98.6°F | Resp 15 | Ht <= 58 in | Wt 150.8 lb

## 2018-04-19 DIAGNOSIS — Z0000 Encounter for general adult medical examination without abnormal findings: Secondary | ICD-10-CM

## 2018-04-19 DIAGNOSIS — Z23 Encounter for immunization: Secondary | ICD-10-CM | POA: Diagnosis not present

## 2018-04-19 NOTE — Patient Instructions (Addendum)
  Ms. Earlene PlaterDavis , Thank you for taking time to come for your Medicare Wellness Visit. I appreciate your ongoing commitment to your health goals. Please review the following plan we discussed and let me know if I can assist you in the future.   Follow up with as needed.    Bring a copy of your Health Care Power of Attorney and/or Living Will to be scanned into chart.  Have a great day! These are the goals we discussed: Goals    . DIET - INCREASE LEAN PROTEINS     Low carb diet    . Increase physical activity     Water aerobics       This is a list of the screening recommended for you and due dates:  Health Maintenance  Topic Date Due  . Mammogram  06/07/2018  . Colon Cancer Screening  10/06/2019  . Tetanus Vaccine  08/07/2021  . Flu Shot  Completed  . DEXA scan (bone density measurement)  Completed  . Pneumonia vaccines  Completed

## 2018-04-19 NOTE — Progress Notes (Signed)
Subjective:   Ashley [name] is a 77 y.o. female who presents for an Initial Medicare Annual Wellness Visit.  Review of Systems    No ROS.  Medicare Wellness Visit. Additional risk factors are reflected in the social history.  Cardiac Risk Factors include: advanced age (>102men, >32 women);hypertension     Objective:    Today's Vitals   04/19/18 1155  BP: 118/70  Pulse: 70  Resp: 15  Temp: 98.6 F (37 C)  TempSrc: Oral  SpO2: 95%  Weight: 150 lb 12.8 oz (68.4 kg)  Height: 4\' 10"  (1.473 m)   Body mass index is 31.52 kg/m.  Advanced Directives 04/19/2018 11/17/2017 03/27/2017 01/03/2017  Does Patient Have a Medical Advance Directive? Yes Yes Yes Yes  Type of Estate agent of [place];Living will Healthcare Power of [place];Living will Healthcare Power of [place];Living will Healthcare Power of [place];Living will  Does patient want to make changes to medical advance directive? No - Patient declined - No - Patient declined No - Patient declined  Copy of Healthcare Power of Attorney in Chart? No - copy requested - - No - copy requested    Current Medications (verified) Outpatient Encounter Medications as of 04/19/2018  Medication Sig  . aspirin EC 81 MG tablet Take 81 mg by mouth daily.  Marland Kitchen estradiol (ESTRACE) 1 MG tablet take 2 tablets by mouth every morning  . fluticasone (FLONASE) 50 MCG/ACT nasal spray Place 2 sprays into both nostrils daily.  Marland Kitchen gabapentin (NEURONTIN) 100 MG capsule Take 1 capsule (100 mg total) by mouth at bedtime.  . hydrochlorothiazide (HYDRODIURIL) 25 MG tablet Take 1 tablet (25 mg total) by mouth daily.  . meloxicam (MOBIC) 15 MG tablet Take by mouth.  Marland Kitchen omeprazole (PRILOSEC) 20 MG capsule Take 1 capsule (20 mg total) by mouth daily.  . progesterone (PROMETRIUM) 100 MG capsule Take 100 mg by mouth daily.  . rosuvastatin (CRESTOR) 20 MG tablet TAKE ONE TABLET BY MOUTH EVERY DAY  . tiZANidine (ZANAFLEX) 2 MG tablet Take 1 tablet  (2 mg total) by mouth 3 (three) times daily. (Patient taking differently: Take 2 mg by mouth as needed. )  . venlafaxine XR (EFFEXOR-XR) 150 MG 24 hr capsule TAKE 1 CAPSULE BY MOUTH DAILY  . [DISCONTINUED] diazepam (VALIUM) 5 MG tablet Take 1 tablet (5 mg total) by mouth every 12 (twelve) hours as needed for anxiety.  . [DISCONTINUED] traMADol (ULTRAM) 50 MG tablet Take 1 tablet (50 mg total) by mouth 2 (two) times daily as needed.   No facility-administered encounter medications on file as of 04/19/2018.     Allergies (verified) Ace inhibitors; Pravastatin; and Simvastatin   History: Past Medical History:  Diagnosis Date  . Abnormal uterine bleeding (AUB)    a. benign endometrial polyps on D+C in 03/2017  . Anxiety   . Arthritis   . Chicken pox   . Depression   . GERD (gastroesophageal reflux disease)   . History of stress test    a. 08/2017 Lexiscan MV: EF 55-65%. No ischemia/infarct. Low risk.  Marland Kitchen Hyperlipidemia   . Hypertension   . Lacunar infarction (HCC)    a. Dx in setting of dizziness/vertigo; b. 02/2018 Carotid U/S: bilateral intimal thickening and atherosclerotic plaque. No hemodynamically significant stenoses; c. 03/2018 MRI Brain: No mass or abnl enhancement. Chronic microvascular ischemic changes and mod volume loss of the brain. 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  . Vertigo    Past Surgical History:  Procedure Laterality Date  . DILATATION & CURETTAGE/HYSTEROSCOPY WITH MYOSURE N/A 03/30/2017   Procedure: DILATATION & CURETTAGE/HYSTEROSCOPY WITH MYOSURE;  Surgeon: Schermerhorn, Ihor Austin, MD;  Location: [facility] ORS;  Service: Gynecology;  Laterality: N/A;  . EYE SURGERY Bilateral    Cataract Extraction with IOL  . SKIN LESION EXCISION    . TONSILLECTOMY AND ADENOIDECTOMY     Age 30   Family History  Problem Relation Age of Onset  . Rheum arthritis Mother   . Ovarian cancer Sister   . COPD Sister   . CAD Sister   . Heart disease Father   .  Aneurysm Father   . Cancer Brother   . Thrombosis Maternal Grandmother        a. DVT  . Thrombosis Maternal Grandfather        a. DVT  . Thrombosis Paternal Grandmother        a. DVT  . Thrombosis Paternal Grandfather        a. DVT   Social History   Socioeconomic History  . Marital status: Married    Spouse name: Not on file  . Number of children: Not on file  . Years of education: Not on file  . Highest education level: Not on file  Occupational History  . Not on file  Social Needs  . Financial resource strain: Not hard at all  . Food insecurity:    Worry: Never true    Inability: Never true  . Transportation needs:    Medical: No    Non-medical: No  Tobacco Use  . Smoking status: Former Smoker    Packs/day: 0.50    Years: 15.00    Pack years: 7.50    Types: Cigarettes    Last attempt to quit: 08/07/1976    Years since quitting: 41.7  . Smokeless tobacco: Never Used  Substance and Sexual Activity  . Alcohol use: Yes    Comment: ocassionally  . Drug use: No  . Sexual activity: Not on file  Lifestyle  . Physical activity:    Days per week: 0 days    Minutes per session: Not on file  . Stress: Not on file  Relationships  . Social connections:    Talks on phone: Not on file    Gets together: Not on file    Attends religious service: Not on file    Active member of club or organization: Not on file    Attends meetings of clubs or organizations: Not on file    Relationship status: Not on file  Other Topics Concern  . Not on file  Social History Narrative   Married     Tobacco Counseling Counseling given: Not Answered   Clinical Intake:  Pre-visit preparation completed: Yes  Pain : No/denies pain     Nutritional Status: BMI > 30  Obese Diabetes: No  How often do you need to have someone help you when you read instructions, pamphlets, or other written materials from your doctor or pharmacy?: 1 - Never  Interpreter Needed?: No      Activities  of Daily Living In your present state of health, do you have any difficulty performing the following activities: 04/19/2018  Hearing? N  Vision? N  Difficulty concentrating or making decisions? N  Walking or climbing stairs? N  Dressing or bathing? N  Doing errands, shopping? N  Preparing Food and eating ? N  Using the Toilet? N  In the past six months, have you accidently leaked urine?  Y  Comment Managed with a daily liner  Do you have problems with loss of bowel control? N  Managing your Medications? N  Managing your Finances? N  Housekeeping or managing your Housekeeping? N  Some recent data might be hidden     Immunizations and Health Maintenance Immunization History  Administered Date(s) Administered  . Influenza, High Dose Seasonal PF 04/19/2018  . Influenza-Unspecified 05/17/2016, 05/17/2017  . Zoster Recombinat (Shingrix) 03/28/2018   There are no preventive care reminders to display for this patient.  Patient Care Team: Glori Luis, MD as PCP - General (Family Medicine) Iran Ouch, MD as PCP - Cardiology (Cardiology)  Indicate any recent Medical Services you may have received from other than [facility] providers in the past year (date may be approximate).     Assessment:   This is a routine wellness examination for Ashley [name]. The goal of the wellness visit is to assist the patient how to close the gaps in care and create a preventative care plan for the patient.   The roster of all physicians providing medical care to patient is listed in the Snapshot section of the chart.  Osteoporosis risk reviewed.    Safety issues reviewed; primary caretaker of husband with parkinson's disease. Smoke and carbon monoxide detectors in the home. No firearms in the home. Wears seatbelts when driving or riding with others. No violence in the home.  They do not have excessive sun exposure.  Discussed the need for sun protection: hats, long sleeves and the use of sunscreen  if there is significant sun exposure.  Patient is alert, normal appearance, oriented to person/place/and time.  Correctly identified the president of the [place] and recalls of 3/3 words. Performs simple calculations and can read correct time from watch face. Displays appropriate judgement.  No new identified risk were noted.  No failures at ADL's or IADL's.    BMI- discussed the importance of a healthy diet, water intake and the benefits of aerobic exercise. Educational material provided.   24 hour diet recall: Regular diet.  Low carb diet encouraged.   Dental- every 6 months.  Eye- Visual acuity not assessed per patient preference since they have regular follow up with the ophthalmologist.  Wears corrective lenses.  Sleep patterns- Sleeps 8 hours at night.  Wakes feeling rested.   High dose influenza vaccine administered R deltoid, tolerated well. Educational material provided.  No distress or verbal complaint during or after injection.   Health maintenance gaps- closed.  Patient Concerns: None at this time. Follow up with PCP as needed.  Hearing/Vision screen Hearing Screening Comments: Patient is able to hear conversational tones without difficulty.  No issues reported.   Vision Screening Comments: Followed by Dr. Alvester Morin Wears corrective lenses Annual vists Cataract extraction, bilateral Visual acuity not assessed per patient preference since they have regular follow up with the ophthalmologist  Dietary issues and exercise activities discussed: Current Exercise Habits: The patient does not participate in regular exercise at present  Goals    . DIET - INCREASE LEAN PROTEINS     Low carb diet    . Increase physical activity     Water aerobics      Depression Screen PHQ 2/9 Scores 04/19/2018 03/11/2018 01/24/2018 01/16/2017  PHQ - 2 Score 0 0 0 1  PHQ- 9 Score - - - 3    Fall Risk Fall Risk  04/19/2018 03/11/2018 01/24/2018 07/09/2017  Falls in the past year? No No No No    Cognitive  Function:     6CIT Screen 04/19/2018  What Year? 0 points  What month? 0 points  What time? 0 points  Count back from 20 0 points  Months in reverse 0 points  Repeat phrase 0 points  Total Score 0    Screening Tests Health Maintenance  Topic Date Due  . MAMMOGRAM  06/07/2018  . COLONOSCOPY  10/06/2019  . TETANUS/TDAP  08/07/2021  . INFLUENZA VACCINE  Completed  . DEXA SCAN  Completed  . PNA vac Low Risk Adult  Completed     Plan:   End of life planning; Advance aging; Advanced directives discussed. Copy of current HCPOA/Living Will requested.    I have personally reviewed and noted the following in the patient's chart:   . Medical and social history . Use of alcohol, tobacco or illicit drugs  . Current medications and supplements . Functional ability and status . Nutritional status . Physical activity . Advanced directives . List of other physicians . Hospitalizations, surgeries, and ER visits in previous 12 months . Vitals . Screenings to include cognitive, depression, and falls . Referrals and appointments  In addition, I have reviewed and discussed with patient certain preventive protocols, quality metrics, and best practice recommendations. A written personalized care plan for preventive services as well as general preventive health recommendations were provided to patient.     Ashok PallOBrien-Blaney, Denisa L, LPN   1/61/09609/13/2019    Agree with plan. Rennie PlowmanMargaret Arnett, NP

## 2018-04-23 ENCOUNTER — Encounter: Payer: Self-pay | Admitting: Family Medicine

## 2018-04-23 ENCOUNTER — Ambulatory Visit: Payer: Medicare HMO | Admitting: Family Medicine

## 2018-04-23 VITALS — BP 148/98 | HR 103 | Temp 97.2°F | Ht <= 58 in | Wt 148.8 lb

## 2018-04-23 DIAGNOSIS — N950 Postmenopausal bleeding: Secondary | ICD-10-CM | POA: Diagnosis not present

## 2018-04-23 DIAGNOSIS — R600 Localized edema: Secondary | ICD-10-CM

## 2018-04-23 DIAGNOSIS — M25562 Pain in left knee: Secondary | ICD-10-CM | POA: Diagnosis not present

## 2018-04-23 DIAGNOSIS — R42 Dizziness and giddiness: Secondary | ICD-10-CM

## 2018-04-23 DIAGNOSIS — G8929 Other chronic pain: Secondary | ICD-10-CM | POA: Diagnosis not present

## 2018-04-23 DIAGNOSIS — E785 Hyperlipidemia, unspecified: Secondary | ICD-10-CM

## 2018-04-23 DIAGNOSIS — I10 Essential (primary) hypertension: Secondary | ICD-10-CM

## 2018-04-23 DIAGNOSIS — M25561 Pain in right knee: Secondary | ICD-10-CM | POA: Insufficient documentation

## 2018-04-23 LAB — COMPREHENSIVE METABOLIC PANEL
ALT: 14 U/L (ref 0–35)
AST: 19 U/L (ref 0–37)
Albumin: 4.4 g/dL (ref 3.5–5.2)
Alkaline Phosphatase: 50 U/L (ref 39–117)
BUN: 11 mg/dL (ref 6–23)
CO2: 30 mEq/L (ref 19–32)
Calcium: 10.2 mg/dL (ref 8.4–10.5)
Chloride: 99 mEq/L (ref 96–112)
Creatinine, Ser: 0.69 mg/dL (ref 0.40–1.20)
GFR: 88.16 mL/min (ref >60.00)
Glucose, Bld: 101 mg/dL — ABNORMAL HIGH (ref 70–99)
Potassium: 3.9 mEq/L (ref 3.5–5.1)
Sodium: 139 mEq/L (ref 135–145)
Total Bilirubin: 0.4 mg/dL (ref 0.2–1.2)
Total Protein: 7.3 g/dL (ref 6.0–8.3)

## 2018-04-23 LAB — LIPID PANEL
Cholesterol: 153 mg/dL (ref 0–200)
HDL: 52.5 mg/dL (ref >39.00)
NonHDL: 100.26
Total CHOL/HDL Ratio: 3
Triglycerides: 240 mg/dL — ABNORMAL HIGH (ref 0.0–149.0)
VLDL: 48 mg/dL — ABNORMAL HIGH (ref 0.0–40.0)

## 2018-04-23 LAB — LDL CHOLESTEROL, DIRECT: Direct LDL: 78 mg/dL

## 2018-04-23 NOTE — Assessment & Plan Note (Signed)
She has had no recurrence of this since seeing GYN.  She has been on estradiol and progesterone.  I did discuss the potential risk of stroke and heart attack with the estradiol.  We will send our note to her gynecologist to make them aware of her recent MRI findings so they can determine if she needs to continue on estradiol.  She will discuss this further with them at her upcoming appointment as well.

## 2018-04-23 NOTE — Assessment & Plan Note (Signed)
Symptoms resolved with steroids.  This would argue for an inner ear issue.  MRI unrevealing for cause.  Monitor for recurrence.

## 2018-04-23 NOTE — Progress Notes (Signed)
Tommi Rumps, MD Phone: [phone]  CAERA ENWRIGHT is a 77 y.o. female who presents today for f/u.  CC: htn, abnormal MRI, vertigo, HLD, knee pain  HYPERTENSION  Disease Monitoring  Home BP Monitoring not checking, though has been better controlled when previously checked chest pain- no    Dyspnea- no Medications  Compliance-  Taking HCTZ.  Edema- yes, mild, intermittent, goes down overnight, no orthopnea or PND  HYPERLIPIDEMIA Symptoms Chest pain on exertion:  no Medications: Compliance- taking crestor Right upper quadrant pain- no  Muscle aches- no  Vertigo: Patient notes her dizziness resolved with steroids.  She has not had any recurrence.  Chronic lacunar infarcts: Patient underwent MRI to evaluate her vertigo.  No mass lesions were found though there were chronic microvascular ischemic changes and small chronic lacunar infarcts.  She was recommended to follow-up with me for her cholesterol and blood pressure.  No numbness or weakness.  Bilateral knee pain: Patient notes this is a chronic issue.  They ache particularly when she gets down on them to look under the bed.  No injury.  Notes a soreness.  Voltaren helps.  Postmenopausal bleeding: She has not had any recurrence.  She underwent evaluation through gynecology.  She has been on estradiol and progesterone.  Social History   Tobacco Use  Smoking Status Former Smoker  . Packs/day: 0.50  . Years: 15.00  . Pack years: 7.50  . Types: Cigarettes  . Last attempt to quit: 08/07/1976  . Years since quitting: 41.7  Smokeless Tobacco Never Used     ROS see history of present illness  Objective  Physical Exam Vitals:   04/23/18 1024  BP: (!) 148/98  Pulse: (!) 103  Temp: (!) 97.2 F (36.2 C)  SpO2: 93%    BP Readings from Last 3 Encounters:  04/23/18 (!) 148/98  04/19/18 118/70  04/11/18 120/70   Wt Readings from Last 3 Encounters:  04/23/18 148 lb 12.8 oz (67.5 kg)  04/19/18 150 lb 12.8 oz (68.4 kg)   04/11/18 149 lb (67.6 kg)    Physical Exam  Constitutional: No distress.  Cardiovascular: Normal rate, regular rhythm and normal heart sounds.  Pulmonary/Chest: Effort normal and breath sounds normal.  Musculoskeletal: She exhibits no edema.  Bilateral knees with no swelling, warmth, erythema, tenderness, or ligamentous laxity, negative McMurray's  Neurological: She is alert.  CN 2-12 intact, 5/5 strength in bilateral biceps, triceps, grip, quads, hamstrings, plantar and dorsiflexion, sensation to light touch intact in bilateral UE and LE, normal gait  Skin: Skin is warm and dry. She is not diaphoretic.     Assessment/Plan: Please see individual problem list.  Essential hypertension Elevated today though has been well controlled recently.  We will have her check at home daily for the next week and return for nurse BP check.  Continue HCTZ.  Bilateral leg edema Chronic issue.  No CHF symptoms.  Suspect venous insufficiency.  Prior lab work unremarkable.  Post-menopausal bleeding She has had no recurrence of this since seeing GYN.  She has been on estradiol and progesterone.  I did discuss the potential risk of stroke and heart attack with the estradiol.  We will send our note to her gynecologist to make them aware of her recent MRI findings so they can determine if she needs to continue on estradiol.  She will discuss this further with them at her upcoming appointment as well.  Vertigo Symptoms resolved with steroids.  This would argue for an inner ear issue.  MRI unrevealing for cause.  Monitor for recurrence.  Hyperlipidemia Check lipid panel.  Continue Crestor.  Bilateral knee pain Chronic intermittent issue.  I suspect osteoarthritis.  We discussed potential x-ray though the patient and I both decided to hold off on this at this time.  She will continue Voltaren gel as needed.  She will monitor her symptoms.   Orders Placed This Encounter  Procedures  . Lipid panel  . Comp  Met (CMET)    No orders of the defined types were placed in this encounter.    Eric Sonnenberg, MD [facility]  

## 2018-04-23 NOTE — Assessment & Plan Note (Signed)
Elevated today though has been well controlled recently.  We will have her check at home daily for the next week and return for nurse BP check.  Continue HCTZ.

## 2018-04-23 NOTE — Assessment & Plan Note (Signed)
Chronic issue.  No CHF symptoms.  Suspect venous insufficiency.  Prior lab work unremarkable.

## 2018-04-23 NOTE — Patient Instructions (Signed)
Nice to see you. We will have you check your blood pressure daily for the next week and return for blood pressure check with the nurse next week. We will check your cholesterol today and contact you with the results. Please keep your upcoming appointment with your gynecologist.

## 2018-04-23 NOTE — Assessment & Plan Note (Signed)
Chronic intermittent issue.  I suspect osteoarthritis.  We discussed potential x-ray though the patient and I both decided to hold off on this at this time.  She will continue Voltaren gel as needed.  She will monitor her symptoms.

## 2018-04-23 NOTE — Assessment & Plan Note (Signed)
Check lipid panel.  Continue Crestor. 

## 2018-04-24 NOTE — Progress Notes (Signed)
Called pt and left a VM that letter is ready and has been placed up front for pick up.

## 2018-04-24 NOTE — Progress Notes (Signed)
Called pt and left a VM that letter is ready for pick up and has been placed up front.

## 2018-04-25 ENCOUNTER — Telehealth: Payer: Self-pay | Admitting: *Deleted

## 2018-04-25 NOTE — Telephone Encounter (Signed)
Patient tried to return your call

## 2018-04-26 ENCOUNTER — Other Ambulatory Visit: Payer: Self-pay

## 2018-04-26 MED ORDER — ROSUVASTATIN CALCIUM 40 MG PO TABS: 40.0000 mg | ORAL_TABLET | Freq: Every day | ORAL | 0 refills | Status: DC

## 2018-04-26 NOTE — Telephone Encounter (Signed)
Patient returned call

## 2018-04-26 NOTE — Telephone Encounter (Signed)
[facility]

## 2018-05-01 ENCOUNTER — Ambulatory Visit (INDEPENDENT_AMBULATORY_CARE_PROVIDER_SITE_OTHER): Payer: Medicare HMO | Admitting: *Deleted

## 2018-05-01 VITALS — BP 118/72 | HR 92 | Resp 16

## 2018-05-01 DIAGNOSIS — I10 Essential (primary) hypertension: Secondary | ICD-10-CM

## 2018-05-01 NOTE — Progress Notes (Signed)
Patient here for nurse visit BP check per order from 04/23/18.   Patient reports compliance with prescribed BP medications: yes, HCTZ 25 MG, Taken  At 8 this morning  BP Readings from Last 3 Encounters:  05/01/18 118/72  04/23/18 (!) 148/98  04/19/18 118/70   Pulse Readings from Last 3 Encounters:  05/01/18 92  04/23/18 (!) 103  04/19/18 70    Patient brought in list from home reading taken in the afternoons 2 - 5 PM.  04/24/18 157/84, 04/25/18 153/92, 04/27/18 137/83, 04/30/18 143/72, 04/30/18 131/75 04/28/18 148/80.  Patient verbalized understanding of instructions.   Henrene Pastor, LPN

## 2018-05-02 NOTE — Progress Notes (Signed)
Patient's blood pressure appears to be intermittently above goal.  I would suggest adding a low-dose of amlodipine and rechecking her blood pressure in 1 month.  If she is willing please send amlodipine 5 mg tablets with the directions take 1 tablet by mouth daily to the pharmacy.

## 2018-05-07 ENCOUNTER — Telehealth: Payer: Self-pay | Admitting: *Deleted

## 2018-05-07 MED ORDER — AMLODIPINE BESYLATE 5 MG PO TABS: 5.0000 mg | ORAL_TABLET | Freq: Every day | ORAL | 1 refills | Status: DC

## 2018-05-07 NOTE — Telephone Encounter (Signed)
Amlodipine 5 mg sent to pharmacy per PCP patient has agreed to new medication and nurse visit scheduled.

## 2018-05-07 NOTE — Telephone Encounter (Signed)
Ashley Luis, MD at 05/01/2018 9:30 AM   Status: Signed    Patient's blood pressure appears to be intermittently above goal.  I would suggest adding a low-dose of amlodipine and rechecking her blood pressure in 1 month.  If she is willing please send amlodipine 5 mg tablets with the directions take 1 tablet by mouth daily to the pharmacy.

## 2018-05-07 NOTE — Telephone Encounter (Signed)
Left message for patient to return call to Office [facility] Nurse. may advise patient of PCP orders below

## 2018-05-07 NOTE — Progress Notes (Signed)
See phone note called patient no answer left message to call office.

## 2018-05-17 ENCOUNTER — Encounter: Payer: Self-pay | Admitting: Family Medicine

## 2018-05-17 ENCOUNTER — Ambulatory Visit: Payer: Medicare HMO | Admitting: Family Medicine

## 2018-05-17 ENCOUNTER — Other Ambulatory Visit: Payer: Self-pay

## 2018-05-17 ENCOUNTER — Other Ambulatory Visit: Payer: Self-pay | Admitting: Family Medicine

## 2018-05-17 VITALS — BP 134/82 | HR 100 | Temp 98.5°F | Wt 148.4 lb

## 2018-05-17 DIAGNOSIS — M654 Radial styloid tenosynovitis [de Quervain]: Secondary | ICD-10-CM

## 2018-05-17 DIAGNOSIS — M25561 Pain in right knee: Secondary | ICD-10-CM | POA: Diagnosis not present

## 2018-05-17 DIAGNOSIS — M25562 Pain in left knee: Secondary | ICD-10-CM | POA: Diagnosis not present

## 2018-05-17 DIAGNOSIS — G8929 Other chronic pain: Secondary | ICD-10-CM

## 2018-05-17 DIAGNOSIS — E785 Hyperlipidemia, unspecified: Secondary | ICD-10-CM | POA: Diagnosis not present

## 2018-05-17 DIAGNOSIS — M722 Plantar fascial fibromatosis: Secondary | ICD-10-CM | POA: Diagnosis not present

## 2018-05-17 MED ORDER — ROSUVASTATIN CALCIUM 40 MG PO TABS: 40.0000 mg | ORAL_TABLET | Freq: Every day | ORAL | 1 refills | Status: DC

## 2018-05-17 NOTE — Assessment & Plan Note (Signed)
Patient has done well on 40 mg of Crestor.  She will return for lab work in November.

## 2018-05-17 NOTE — Patient Instructions (Signed)
Nice to see you. You likely have plantar fasciitis.  Please use a water bottle that was frozen and roll your foot on it.  You can do the stretch that we discussed.  You can try ibuprofen 600 mg twice daily for the next 2 to 3 days. You should ice your right wrist as well. We will get you to see a podiatrist.

## 2018-05-17 NOTE — Assessment & Plan Note (Signed)
Left knee bothering her mostly now.  Discussed potential for x-ray to evaluate for osteoarthritis though she deferred.  She will monitor at this time.

## 2018-05-17 NOTE — Assessment & Plan Note (Signed)
Symptoms consistent with plantar fasciitis.  Refer to podiatry.  She will ice.  She will stretch.  Discussed ibuprofen 600 mg every 12 hours for the next 2 to 3 days.  She will take this with food.

## 2018-05-17 NOTE — Progress Notes (Signed)
[name], MD Phone: [phone]  Ashley [name] is a 77 y.o. female who presents today for same day visit.   CC: right foot pain, left knee pain, right wrist pain  Right foot pain started about a week ago.  Notes its worse in the morning when she first gets out of bed.  Gets a little bit better as the day goes on.  No injury.  Notes it hurts in her arch near her heel.  Soaking it did not help.  She is taking 400 mg of ibuprofen twice daily with little benefit.  Chronic left knee pain: Mostly bothers her at night and in the morning when she gets up.  Does not lock on her or give out.  It is a little stiff.  No prior imaging.  Radial aspect right wrist discomfort has been going on for some time now.  She was given topical anti-inflammatories previously which were somewhat beneficial per her report.    Social History   Tobacco Use  Smoking Status Former Smoker  . Packs/day: 0.50  . Years: 15.00  . Pack years: 7.50  . Types: Cigarettes  . Last attempt to quit: 08/07/1976  . Years since quitting: 41.8  Smokeless Tobacco Never Used     ROS see history of present illness  Objective  Physical Exam Vitals:   05/17/18 1507  BP: 134/82  Pulse: 100  Temp: 98.5 F (36.9 C)  SpO2: 98%    BP Readings from Last 3 Encounters:  05/17/18 134/82  05/01/18 118/72  04/23/18 (!) 148/98   Wt Readings from Last 3 Encounters:  05/17/18 148 lb 6.4 oz (67.3 kg)  04/23/18 148 lb 12.8 oz (67.5 kg)  04/19/18 150 lb 12.8 oz (68.4 kg)    Physical Exam  Constitutional: She appears well-developed and well-nourished.  Pulmonary/Chest: Effort normal.  Musculoskeletal:  Right plantar surface of foot in the proximal arch with tenderness, no swelling or warmth, negative heel squeeze, no bony tenderness in the ankle, 2+ DP and PT pulses, left foot with no tenderness or swelling or warmth, 2+ DP and PT pulse, slight tenderness over the radial aspect of her right wrist, no anatomic snuffbox  tenderness, positive Finkelstein's, no left knee tenderness or swelling     Assessment/Plan: Please see individual problem list.  Plantar fasciitis of right foot Symptoms consistent with plantar fasciitis.  Refer to podiatry.  She will ice.  She will stretch.  Discussed ibuprofen 600 mg every 12 hours for the next 2 to 3 days.  She will take this with food.  Bilateral knee pain Left knee bothering her mostly now.  Discussed potential for x-ray to evaluate for osteoarthritis though she deferred.  She will monitor at this time.  De Quervain's tenosynovitis, right Symptoms and exam consistent with this.  Discussed icing and if not improving we would refer.  Hyperlipidemia Patient has done well on 40 mg of Crestor.  She will return for lab work in November.   Orders Placed This Encounter  Procedures  . LDL cholesterol, direct    Standing Status:   Future    Standing Expiration Date:   05/18/2019  . Hepatic function panel    Standing Status:   Future    Standing Expiration Date:   05/18/2019  . Ambulatory referral to Podiatry    Referral Priority:   Routine    Referral Type:   Consultation    Referral Reason:   Specialty Services Required    Requested Specialty:  Podiatry    Number of Visits Requested:   1    Meds ordered this encounter  Medications  . rosuvastatin (CRESTOR) 40 MG tablet    Sig: Take 1 tablet (40 mg total) by mouth daily.    Dispense:  90 tablet    Refill:  1     [name], MD [facility]

## 2018-05-17 NOTE — Assessment & Plan Note (Signed)
Symptoms and exam consistent with this.  Discussed icing and if not improving we would refer.

## 2018-05-27 ENCOUNTER — Telehealth: Payer: Self-pay | Admitting: Cardiovascular Disease

## 2018-05-27 NOTE — Telephone Encounter (Signed)
Patient calling States that she is having no other symptoms but has been having left arm aches from shoulder down to hand and would like to discuss with nurse Please call to discuss

## 2018-05-27 NOTE — Telephone Encounter (Signed)
Returned the call to the patient. She stated that she has been having left arm pain for the past week. She denies chest pain and shortness of breath.  She stated that she fell on the arm 3 weeks ago. She is going to her PCP tomorrow for an X-ray.   She has been advised to go to the ED if the pain radiates to her chest or jaw, has any chest tightness or if she feels shortness of breath.

## 2018-05-28 ENCOUNTER — Ambulatory Visit: Payer: Medicare HMO | Admitting: Family Medicine

## 2018-05-28 ENCOUNTER — Ambulatory Visit (INDEPENDENT_AMBULATORY_CARE_PROVIDER_SITE_OTHER): Payer: Medicare HMO

## 2018-05-28 ENCOUNTER — Encounter: Payer: Self-pay | Admitting: Family Medicine

## 2018-05-28 VITALS — BP 128/74 | HR 87 | Temp 98.4°F | Ht <= 58 in | Wt 149.6 lb

## 2018-05-28 DIAGNOSIS — W19XXXA Unspecified fall, initial encounter: Secondary | ICD-10-CM | POA: Diagnosis not present

## 2018-05-28 DIAGNOSIS — M79602 Pain in left arm: Secondary | ICD-10-CM | POA: Diagnosis not present

## 2018-05-28 DIAGNOSIS — M25512 Pain in left shoulder: Secondary | ICD-10-CM

## 2018-05-28 DIAGNOSIS — M19012 Primary osteoarthritis, left shoulder: Secondary | ICD-10-CM | POA: Diagnosis not present

## 2018-05-28 MED ORDER — METHYLPREDNISOLONE 4 MG PO TBPK: ORAL_TABLET | ORAL | 0 refills | Status: DC

## 2018-05-28 MED ORDER — CYCLOBENZAPRINE HCL 5 MG PO TABS: 5.0000 mg | ORAL_TABLET | Freq: Three times a day (TID) | ORAL | 0 refills | Status: DC | PRN

## 2018-05-28 NOTE — Progress Notes (Signed)
Subjective:    Patient ID: Ashley [name], female    DOB: June 23, 1941, 77 y.o.   MRN: [id number]  HPI   Patient presents to clinic complaining of pain in left arm that began over a month ago.  Patient states she did fall approximately 2 months ago landing on left side, when she fell she did not reach arm out; she tucked arm in and and landed on left shoulder.  Patient states the pain in left arm is an aching type pain and aches almost all the time.  Patient states IcyHot and Advil are somewhat effective, but she notices the pain more so at night and cannot lay on left side.  Patient is also concerned that the left arm pain could be related to her heart and would like this checked.  Denies any pain in chest, palpitations, feeling faint or dizzy, shortness of breath, sweating.   Patient Active Problem List   Diagnosis Date Noted  . Plantar fasciitis of right foot 05/17/2018  . De Quervain's tenosynovitis, right 05/17/2018  . Bilateral knee pain 04/23/2018  . Left arm pain 03/18/2018  . Bilateral leg edema 10/11/2017  . Prediabetes 10/11/2017  . Chronic low back pain 10/11/2017  . Vertigo 10/11/2017  . Post-menopausal bleeding 07/09/2017  . Neck pain 07/09/2017  . Atypical chest pain 04/24/2017  . Jaw pain 04/24/2017  . Family history of ovarian cancer 01/16/2017  . Essential hypertension 01/16/2017  . GERD (gastroesophageal reflux disease) 01/16/2017  . Depression 12/28/2014  . Hyperlipidemia 12/28/2014   Social History   Tobacco Use  . Smoking status: Former Smoker    Packs/day: 0.50    Years: 15.00    Pack years: 7.50    Types: Cigarettes    Last attempt to quit: 08/07/1976    Years since quitting: 41.8  . Smokeless tobacco: Never Used  Substance Use Topics  . Alcohol use: Yes    Comment: ocassionally   Review of Systems  Constitutional: Negative for chills, fatigue and fever.  HENT: Negative for congestion, ear pain, sinus pain and sore throat.   Eyes: Negative.     Respiratory: Negative for cough, shortness of breath and wheezing.   Cardiovascular: Negative for chest pain, palpitations and leg swelling.  Gastrointestinal: Negative for abdominal pain, diarrhea, nausea and vomiting.  Genitourinary: Negative for dysuria, frequency and urgency.  Musculoskeletal: +left arm/shoulder pain. Skin: Negative for color change, pallor and rash.  Neurological: Negative for syncope, light-headedness and headaches.  Psychiatric/Behavioral: The patient is not nervous/anxious.       Objective:   Physical Exam  Constitutional: She is oriented to person, place, and time. She appears well-nourished. No distress.  HENT:  Head: Normocephalic and atraumatic.  Eyes: Conjunctivae and EOM are normal. No scleral icterus.  Neck: Neck supple. No JVD present. No tracheal deviation present.  Cardiovascular: Normal rate, regular rhythm and normal heart sounds.  Pulmonary/Chest: Effort normal and breath sounds normal. No respiratory distress. She has no wheezes. She has no rales.  Musculoskeletal: She exhibits tenderness. She exhibits no edema or deformity.       Left shoulder: She exhibits tenderness.  Patient is able to raise Left arm straight up above head, reach straight out to the side, reach forward and backward without issue.  Does have pain when reaching straight up above head, reaching across chest and also reaching arm behind back.  Grips equal and strong.  Range of motion of elbows and wrists intact.  Neurological: She is alert and oriented  to person, place, and time.  Skin: Skin is warm and dry. No rash noted. She is not diaphoretic. No erythema. No pallor.  Psychiatric: She has a normal mood and affect. Her behavior is normal. Thought content normal.  Nursing note and vitals reviewed.     Vitals:   05/28/18 0808  BP: 128/74  Pulse: 87  Temp: 98.4 F (36.9 C)  SpO2: 94%   Assessment & Plan:   Left shoulder pain/left arm pain -- we will get x-ray of left  shoulder to further evaluate.  I suspect pain is related to arthritis which was exacerbated by patient fall.  Patient advised she can continue to use Advil as needed as well as IcyHot.  We will do prednisone taper to calm down joint inflammation, and patient also given Flexeril as needed to use for muscle spasm.  EKG done in clinic, and reviewed by me.  EKG is unremarkable, shows a normal sinus rhythm.  Keep regular follow-up as planned with PCP.  Return to clinic sooner if any issues arise.  If pain continues to persist in shoulder joint, we can consider orthopedic referral.

## 2018-06-11 ENCOUNTER — Ambulatory Visit (INDEPENDENT_AMBULATORY_CARE_PROVIDER_SITE_OTHER): Payer: Medicare HMO | Admitting: *Deleted

## 2018-06-11 VITALS — BP 120/72 | HR 88 | Resp 18

## 2018-06-11 DIAGNOSIS — I10 Essential (primary) hypertension: Secondary | ICD-10-CM

## 2018-06-11 NOTE — Progress Notes (Signed)
Patient here for nurse visit BP check per order from Nurse visit X 1 month added amlodipine 5 mg..   Patient reports compliance with prescribed BP medications: yes  Last dose of BP medication: 9 AM today  BP Readings from Last 3 Encounters:  06/11/18 120/72  05/28/18 128/74  05/17/18 134/82   Pulse Readings from Last 3 Encounters:  06/11/18 88  05/28/18 87  05/17/18 100      Patient verbalized understanding of instructions.   Henrene Pastor, LPN

## 2018-06-11 NOTE — Progress Notes (Signed)
BP readings look great. Continue current medications.

## 2018-06-19 ENCOUNTER — Telehealth: Payer: Self-pay

## 2018-06-19 NOTE — Telephone Encounter (Signed)
Copied from CRM [phone]. Topic: Referral - Status >> Jun 14, 2018  8:29 AM Elliot GaultBell, Tiffany M wrote: Reason for CRM:  Relation to pt: self  Call back number: [phone]  Reason for call:  Patient states she received a call regarding a referral and would like to know the status. Advised patient regarding [facility] referral but unsure about ORTHOPEDIC SURGERY, please advise

## 2018-06-21 NOTE — Telephone Encounter (Signed)
Referral was sent to the wrong workque which delayed the [facility] being [facility]. It has now been submitted to [facility].

## 2018-06-24 DIAGNOSIS — Z1239 Encounter for other screening for malignant neoplasm of breast: Secondary | ICD-10-CM | POA: Diagnosis not present

## 2018-06-24 DIAGNOSIS — Z124 Encounter for screening for malignant neoplasm of cervix: Secondary | ICD-10-CM | POA: Diagnosis not present

## 2018-06-24 DIAGNOSIS — Z1211 Encounter for screening for malignant neoplasm of colon: Secondary | ICD-10-CM | POA: Diagnosis not present

## 2018-07-03 DIAGNOSIS — M19012 Primary osteoarthritis, left shoulder: Secondary | ICD-10-CM | POA: Diagnosis not present

## 2018-07-03 DIAGNOSIS — G8929 Other chronic pain: Secondary | ICD-10-CM | POA: Diagnosis not present

## 2018-07-03 DIAGNOSIS — M25512 Pain in left shoulder: Secondary | ICD-10-CM | POA: Diagnosis not present

## 2018-07-03 DIAGNOSIS — M75122 Complete rotator cuff tear or rupture of left shoulder, not specified as traumatic: Secondary | ICD-10-CM | POA: Diagnosis not present

## 2018-07-08 ENCOUNTER — Other Ambulatory Visit: Payer: Self-pay | Admitting: Sports Medicine

## 2018-07-08 DIAGNOSIS — G8929 Other chronic pain: Secondary | ICD-10-CM

## 2018-07-08 DIAGNOSIS — M25512 Pain in left shoulder: Principal | ICD-10-CM

## 2018-07-15 DIAGNOSIS — Z1231 Encounter for screening mammogram for malignant neoplasm of breast: Secondary | ICD-10-CM | POA: Diagnosis not present

## 2018-07-22 DIAGNOSIS — H26492 Other secondary cataract, left eye: Secondary | ICD-10-CM | POA: Diagnosis not present

## 2018-07-25 ENCOUNTER — Ambulatory Visit
Admission: RE | Admit: 2018-07-25 | Discharge: 2018-07-25 | Disposition: A | Discharge status: Home / Self Care | Payer: Medicare HMO | Source: Ambulatory Visit | Attending: Sports Medicine | Admitting: Sports Medicine

## 2018-07-25 DIAGNOSIS — M25512 Pain in left shoulder: Secondary | ICD-10-CM | POA: Insufficient documentation

## 2018-07-25 DIAGNOSIS — G8929 Other chronic pain: Secondary | ICD-10-CM | POA: Diagnosis not present

## 2018-07-25 DIAGNOSIS — M7552 Bursitis of left shoulder: Secondary | ICD-10-CM | POA: Diagnosis not present

## 2018-07-25 DIAGNOSIS — M75112 Incomplete rotator cuff tear or rupture of left shoulder, not specified as traumatic: Secondary | ICD-10-CM | POA: Diagnosis not present

## 2018-07-25 DIAGNOSIS — M19012 Primary osteoarthritis, left shoulder: Secondary | ICD-10-CM | POA: Diagnosis not present

## 2018-08-01 ENCOUNTER — Other Ambulatory Visit: Payer: Self-pay | Admitting: Family Medicine

## 2018-08-01 DIAGNOSIS — Z1211 Encounter for screening for malignant neoplasm of colon: Secondary | ICD-10-CM | POA: Diagnosis not present

## 2018-08-05 ENCOUNTER — Other Ambulatory Visit: Payer: Self-pay | Admitting: Family Medicine

## 2018-08-06 DIAGNOSIS — H43392 Other vitreous opacities, left eye: Secondary | ICD-10-CM | POA: Diagnosis not present

## 2018-08-12 ENCOUNTER — Telehealth: Payer: Self-pay | Admitting: Family Medicine

## 2018-08-12 DIAGNOSIS — M19012 Primary osteoarthritis, left shoulder: Secondary | ICD-10-CM | POA: Diagnosis not present

## 2018-08-12 DIAGNOSIS — M25512 Pain in left shoulder: Secondary | ICD-10-CM | POA: Diagnosis not present

## 2018-08-12 DIAGNOSIS — M75112 Incomplete rotator cuff tear or rupture of left shoulder, not specified as traumatic: Secondary | ICD-10-CM | POA: Diagnosis not present

## 2018-08-12 DIAGNOSIS — G8929 Other chronic pain: Secondary | ICD-10-CM | POA: Diagnosis not present

## 2018-08-12 DIAGNOSIS — M25412 Effusion, left shoulder: Secondary | ICD-10-CM | POA: Diagnosis not present

## 2018-08-12 DIAGNOSIS — M7552 Bursitis of left shoulder: Secondary | ICD-10-CM | POA: Diagnosis not present

## 2018-08-12 MED ORDER — OMEPRAZOLE 20 MG PO CPDR: 20.0000 mg | DELAYED_RELEASE_CAPSULE | Freq: Every day | ORAL | 2 refills | Status: DC

## 2018-08-12 NOTE — Telephone Encounter (Signed)
Copied from CRM [phone]. Topic: Quick Communication - See Telephone Encounter >> Aug 12, 2018  9:37 AM Jens SomMedley, Jennifer A wrote: CRM for notification. See Telephone encounter for: 08/12/18.  Patient is calling because she discussed omeprazole (PRILOSEC) 20 MG capsule [[id number]][[id number]] with Dr. Birdie SonsSonnenberg. The sig states one every other day. She is requesting one every day. She is need need of a refill. But a new script is needed. Please advise. [facility] [place] - [address] [phone] (Phone) [phone] (Fax)

## 2018-08-12 NOTE — Telephone Encounter (Signed)
Called and spoke with patient. Pt advised correct Rx has been sent into patient's pharmacy.

## 2018-08-12 NOTE — Telephone Encounter (Signed)
Sent to PCP to advise correct directions

## 2018-08-12 NOTE — Telephone Encounter (Signed)
Corrected prescription sent to pharmacy.

## 2018-08-12 NOTE — Telephone Encounter (Signed)
Ok for refill with new sig, per pt request?

## 2018-08-19 IMAGING — DX DG CERVICAL SPINE COMPLETE 4+V
5 series · 5 of 5 positions shown · non-contrast
Comparison: 08/16/2016 MR.

CLINICAL DATA: 75-year-old female with neck stiffness for 3 weeks
mainly left-side. No injury. Initial encounter.

EXAM:
CERVICAL SPINE - COMPLETE 4+ VIEW

[cervical spine ap]
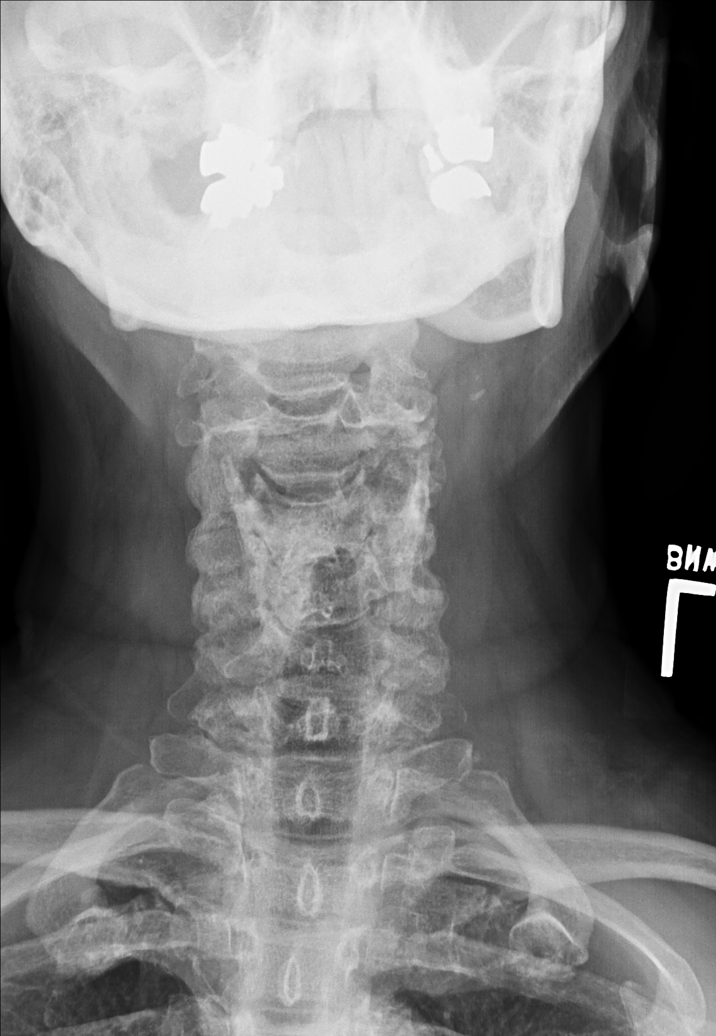

[cervical spine oblique (1 of 2)]
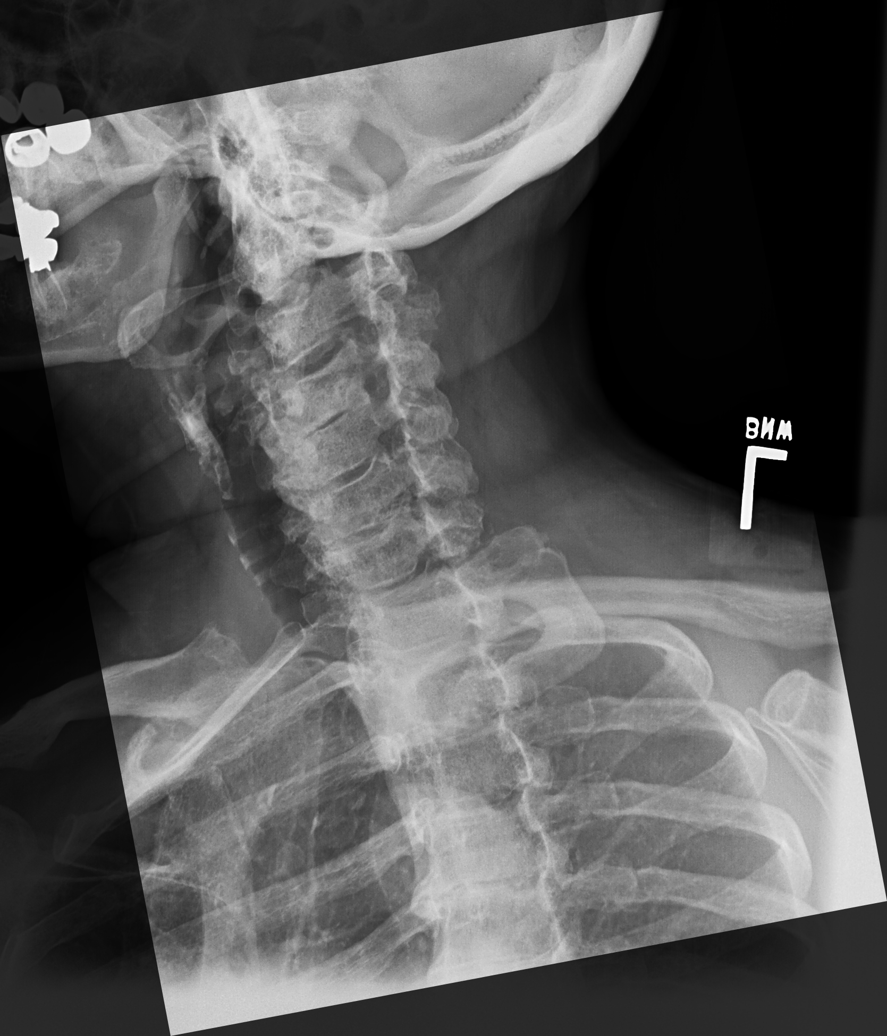

[cervical spine oblique (2 of 2)]
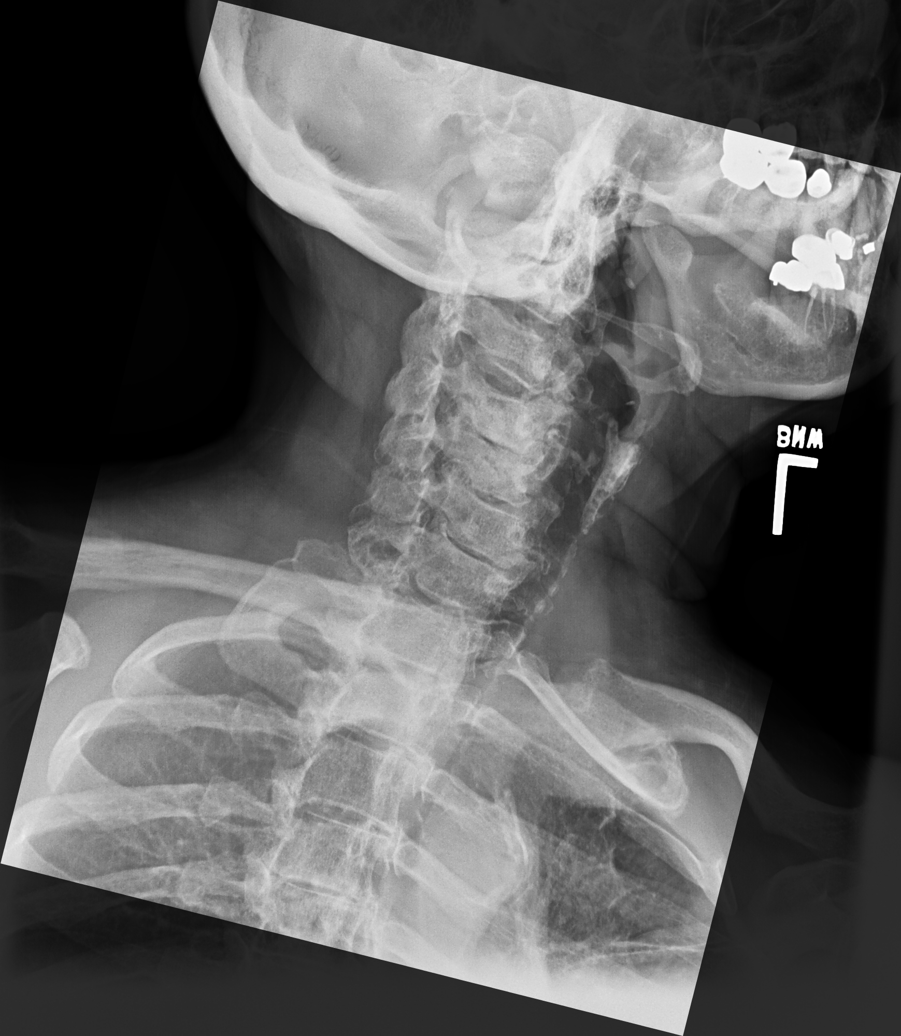

[cervical spine lat]
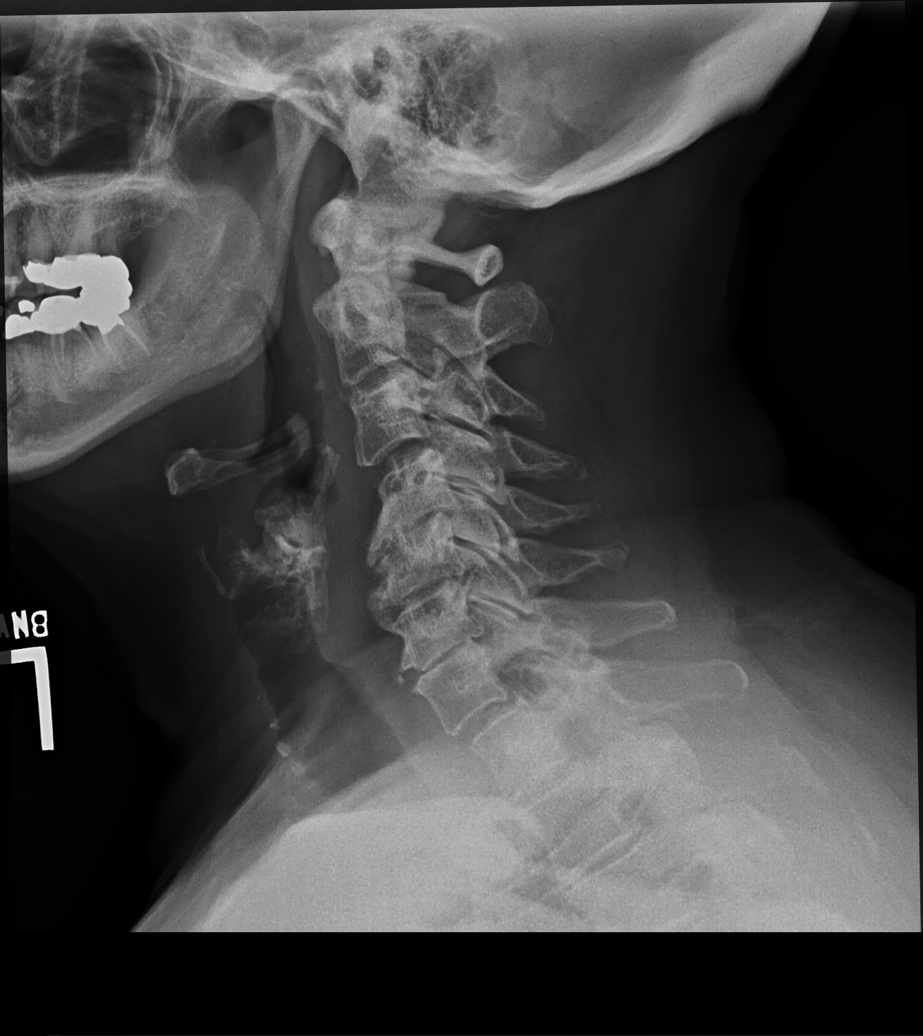

[cervical spine open mouth ap]
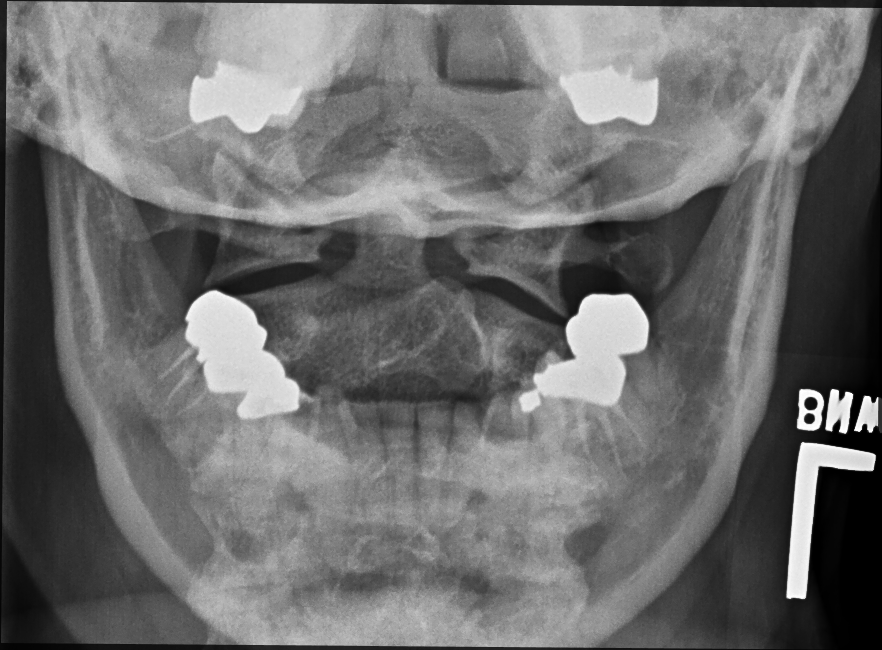

[5 of 5 positions shown; findings below may reference images not displayed]

FINDINGS: Mild curvature cervical spine convex left.

C4-5, C5-6 and C6-7 moderate to marked disc degeneration, disc space
narrowing and osteophyte formation. Uncinate hypertrophy and
bilateral foraminal narrowing.

No abnormal prevertebral soft tissue swelling.

No fracture.

No lung apical lesion.

Left carotid bifurcation calcifications.
IMPRESSION: C4-5, C5-6 and C6-7 moderate to marked disc degeneration, disc space
narrowing and osteophyte formation. Uncinate hypertrophy and
bilateral foraminal narrowing.

## 2018-08-28 ENCOUNTER — Encounter: Payer: Self-pay | Admitting: Family Medicine

## 2018-08-28 ENCOUNTER — Ambulatory Visit: Payer: Medicare HMO | Admitting: Family Medicine

## 2018-08-28 VITALS — BP 126/80 | HR 76 | Temp 97.5°F | Ht <= 58 in | Wt 153.4 lb

## 2018-08-28 DIAGNOSIS — E785 Hyperlipidemia, unspecified: Secondary | ICD-10-CM

## 2018-08-28 DIAGNOSIS — M791 Myalgia, unspecified site: Secondary | ICD-10-CM

## 2018-08-28 DIAGNOSIS — R600 Localized edema: Secondary | ICD-10-CM

## 2018-08-28 DIAGNOSIS — E781 Pure hyperglyceridemia: Secondary | ICD-10-CM | POA: Diagnosis not present

## 2018-08-28 DIAGNOSIS — I10 Essential (primary) hypertension: Secondary | ICD-10-CM

## 2018-08-28 DIAGNOSIS — M75112 Incomplete rotator cuff tear or rupture of left shoulder, not specified as traumatic: Secondary | ICD-10-CM

## 2018-08-28 NOTE — Progress Notes (Signed)
Tommi Rumps, MD Phone: [phone]  NIA NATHANIEL is a 78 y.o. female who presents today for follow-up.  CC: Left shoulder pain, hypertension, hyperlipidemia  Left shoulder pain: Notes partial rotator cuff tears based on MRI.  She has been taking meloxicam for this and this is helped significantly.  She is not in very much pain now.  Also taking Tylenol.  She is seeing a specialist for this.  She does not want surgery.  Hypertension: She is not checking at home.  Taking amlodipine and HCTZ.  No chest pain or shortness of breath.  She does note mild lower extremity edema bilaterally.  This is been going on for about a week or so.  Goes down overnight.  No orthopnea or PND.  Hyperlipidemia: Taking Crestor.  No right upper quadrant pain.  Occasional myalgias in her legs that are bearable.  Social History   Tobacco Use  Smoking Status Former Smoker  . Packs/day: 0.50  . Years: 15.00  . Pack years: 7.50  . Types: Cigarettes  . Last attempt to quit: 08/07/1976  . Years since quitting: 42.0  Smokeless Tobacco Never Used     ROS see history of present illness  Objective  Physical Exam Vitals:   08/28/18 1122  BP: 126/80  Pulse: 76  Temp: (!) 97.5 F (36.4 C)  SpO2: 96%    BP Readings from Last 3 Encounters:  08/28/18 126/80  06/11/18 120/72  05/28/18 128/74   Wt Readings from Last 3 Encounters:  08/28/18 153 lb 6.4 oz (69.6 kg)  05/28/18 149 lb 9.6 oz (67.9 kg)  05/17/18 148 lb 6.4 oz (67.3 kg)    Physical Exam Constitutional:      General: She is not in acute distress.    Appearance: She is not diaphoretic.  Cardiovascular:     Rate and Rhythm: Normal rate and regular rhythm.     Heart sounds: Normal heart sounds.  Pulmonary:     Effort: Pulmonary effort is normal.     Breath sounds: Normal breath sounds.  Musculoskeletal:     Comments: Trace pitting edema bilateral lower extremities to lower shin  Skin:    General: Skin is warm and dry.    Neurological:     Mental Status: She is alert.      Assessment/Plan: Please see individual problem list.  Essential hypertension Adequately controlled.  Continue current regimen.  She will return for fasting lab work.  Hyperlipidemia Currently on Crestor.  She will continue this.  She will return for a fasting lipid panel  Hypertriglyceridemia Return for fasting lipid panel.  Partial tear of left rotator cuff Symptoms have been improving.  She will monitor.  If worsening she will follow-up with her orthopedist.   Orders Placed This Encounter  Procedures  . Lipid panel    Standing Status:   Future    Standing Expiration Date:   08/29/2019  . CK (Creatine Kinase)    Standing Status:   Future    Standing Expiration Date:   08/29/2019  . Comp Met (CMET)    Standing Status:   Future    Standing Expiration Date:   08/29/2019  . TSH    Standing Status:   Future    Standing Expiration Date:   08/29/2019  . CBC    Standing Status:   Future    Standing Expiration Date:   08/29/2019    No orders of the defined types were placed in this encounter.    Tommi Rumps,  MD [place]

## 2018-08-28 NOTE — Assessment & Plan Note (Signed)
Symptoms have been improving.  She will monitor.  If worsening she will follow-up with her orthopedist.

## 2018-08-28 NOTE — Assessment & Plan Note (Signed)
Return for fasting lipid panel 

## 2018-08-28 NOTE — Assessment & Plan Note (Signed)
Currently on Crestor.  She will continue this.  She will return for a fasting lipid panel

## 2018-08-28 NOTE — Patient Instructions (Signed)
Nice to see you.  We will get lab work today. If your left shoulder worsens again please let us know.

## 2018-08-28 NOTE — Assessment & Plan Note (Signed)
Adequately controlled.  Continue current regimen.  She will return for fasting lab work.

## 2018-08-29 ENCOUNTER — Telehealth: Payer: Self-pay

## 2018-08-29 NOTE — Telephone Encounter (Signed)
Copied from CRM [id number]. Topic: Quick Communication - See Telephone Encounter >> Aug 29, 2018  2:40 PM Jens Som A wrote: CRM for notification. See Telephone encounter for: 08/29/18.  Per Dr. Birdie Sons, He needed to patient to reschedule blood work fasting, requesting order to be placed.   **Pt's labs are in & has appointment scheduled.**

## 2018-09-02 ENCOUNTER — Other Ambulatory Visit (INDEPENDENT_AMBULATORY_CARE_PROVIDER_SITE_OTHER): Payer: Medicare HMO

## 2018-09-02 DIAGNOSIS — E785 Hyperlipidemia, unspecified: Secondary | ICD-10-CM

## 2018-09-02 DIAGNOSIS — R600 Localized edema: Secondary | ICD-10-CM

## 2018-09-02 DIAGNOSIS — M791 Myalgia, unspecified site: Secondary | ICD-10-CM

## 2018-09-02 DIAGNOSIS — E781 Pure hyperglyceridemia: Secondary | ICD-10-CM | POA: Diagnosis not present

## 2018-09-02 LAB — LIPID PANEL
Cholesterol: 130 mg/dL (ref 0–200)
HDL: 56.1 mg/dL (ref >39.00)
NonHDL: 73.82
Total CHOL/HDL Ratio: 2
Triglycerides: 214 mg/dL — ABNORMAL HIGH (ref 0.0–149.0)
VLDL: 42.8 mg/dL — ABNORMAL HIGH (ref 0.0–40.0)

## 2018-09-02 LAB — CBC
HCT: 42.9 % (ref 36.0–46.0)
Hemoglobin: 14.4 g/dL (ref 12.0–15.0)
MCHC: 33.5 g/dL (ref 30.0–36.0)
MCV: 90.4 fl (ref 78.0–100.0)
Platelets: 333 10*3/uL (ref 150.0–400.0)
RBC: 4.74 Mil/uL (ref 3.87–5.11)
RDW: 14.1 % (ref 11.5–15.5)
WBC: 7.8 10*3/uL (ref 4.0–10.5)

## 2018-09-02 LAB — COMPREHENSIVE METABOLIC PANEL
ALT: 16 U/L (ref 0–35)
AST: 22 U/L (ref 0–37)
Albumin: 4.3 g/dL (ref 3.5–5.2)
Alkaline Phosphatase: 42 U/L (ref 39–117)
BUN: 12 mg/dL (ref 6–23)
CO2: 26 mEq/L (ref 19–32)
Calcium: 9.5 mg/dL (ref 8.4–10.5)
Chloride: 99 mEq/L (ref 96–112)
Creatinine, Ser: 0.74 mg/dL (ref 0.40–1.20)
GFR: 76.44 mL/min (ref >60.00)
Glucose, Bld: 104 mg/dL — ABNORMAL HIGH (ref 70–99)
Potassium: 4 mEq/L (ref 3.5–5.1)
Sodium: 135 mEq/L (ref 135–145)
Total Bilirubin: 0.4 mg/dL (ref 0.2–1.2)
Total Protein: 6.7 g/dL (ref 6.0–8.3)

## 2018-09-02 LAB — LDL CHOLESTEROL, DIRECT: Direct LDL: 53 mg/dL

## 2018-09-02 LAB — CK: Total CK: 143 U/L (ref 7–177)

## 2018-09-02 LAB — TSH: TSH: 2.52 u[IU]/mL (ref 0.35–4.50)

## 2018-09-05 ENCOUNTER — Other Ambulatory Visit: Payer: Self-pay

## 2018-09-05 ENCOUNTER — Telehealth: Payer: Self-pay | Admitting: Family Medicine

## 2018-09-05 NOTE — Telephone Encounter (Signed)
Copied from CRM [phone] [phone]. Topic: Quick Communication - Lab Results (Clinic Use ONLY) >> Sep 05, 2018 10:28 AM Gracelyn Nurse, [place] wrote: Called patient to inform them of  lab results. When patient returns call, triage nurse may disclose results.

## 2018-09-05 NOTE — Telephone Encounter (Signed)
Left message on voicemail for pt to return call to office for results.    

## 2018-09-17 ENCOUNTER — Telehealth: Payer: Self-pay

## 2018-09-17 NOTE — Telephone Encounter (Signed)
She would need to be seen in the office to determine the appropriate management.  You could offer her an appointment with me or Lauren.  Thanks.

## 2018-09-17 NOTE — Telephone Encounter (Signed)
I know some doctors do their steroid shots here at our office and some do not at our office would this be something you could provide the patient with at there appt? Of would you need to do a referral or will it all just depend on the situation?  Would you like to work pt in for a soon appt of schedule for next available appt.?

## 2018-09-17 NOTE — Telephone Encounter (Signed)
Copied from CRM [phone]. Topic: Appointment Scheduling - Prior Auth Required for Appointment >> Sep 17, 2018 10:27 AM Jolayne Hainesaylor, Brittany L wrote: Patient is requesting to see DR Birdie SonsSonnenberg regarding her back pain she's been having. She [facility] and they advised her to call her PCP to get a steroid. She would like to be worked in

## 2018-09-18 ENCOUNTER — Ambulatory Visit: Payer: Medicare HMO | Admitting: Family Medicine

## 2018-09-18 DIAGNOSIS — H43393 Other vitreous opacities, bilateral: Secondary | ICD-10-CM | POA: Diagnosis not present

## 2018-09-18 DIAGNOSIS — H43391 Other vitreous opacities, right eye: Secondary | ICD-10-CM | POA: Diagnosis not present

## 2018-09-18 DIAGNOSIS — H43313 Vitreous membranes and strands, bilateral: Secondary | ICD-10-CM | POA: Diagnosis not present

## 2018-09-18 DIAGNOSIS — H43392 Other vitreous opacities, left eye: Secondary | ICD-10-CM | POA: Diagnosis not present

## 2018-09-18 DIAGNOSIS — H43312 Vitreous membranes and strands, left eye: Secondary | ICD-10-CM | POA: Diagnosis not present

## 2018-09-18 DIAGNOSIS — I10 Essential (primary) hypertension: Secondary | ICD-10-CM | POA: Diagnosis not present

## 2018-09-18 DIAGNOSIS — Z961 Presence of intraocular lens: Secondary | ICD-10-CM | POA: Diagnosis not present

## 2018-09-18 NOTE — Telephone Encounter (Signed)
Pt scheduled for 02.14.2020 at 1:15pm with Dr. Birdie Sons may call back if this doesn't work for her when she checks her calender

## 2018-09-20 ENCOUNTER — Ambulatory Visit: Payer: Medicare HMO | Admitting: Family Medicine

## 2018-09-20 ENCOUNTER — Encounter: Payer: Self-pay | Admitting: Family Medicine

## 2018-09-20 ENCOUNTER — Ambulatory Visit (INDEPENDENT_AMBULATORY_CARE_PROVIDER_SITE_OTHER): Payer: Medicare HMO

## 2018-09-20 VITALS — BP 108/70 | HR 86 | Temp 98.6°F | Ht <= 58 in | Wt 153.2 lb

## 2018-09-20 DIAGNOSIS — M25562 Pain in left knee: Secondary | ICD-10-CM | POA: Insufficient documentation

## 2018-09-20 DIAGNOSIS — G8929 Other chronic pain: Secondary | ICD-10-CM

## 2018-09-20 DIAGNOSIS — M545 Low back pain, unspecified: Secondary | ICD-10-CM

## 2018-09-20 DIAGNOSIS — R600 Localized edema: Secondary | ICD-10-CM | POA: Diagnosis not present

## 2018-09-20 DIAGNOSIS — M1712 Unilateral primary osteoarthritis, left knee: Secondary | ICD-10-CM | POA: Diagnosis not present

## 2018-09-20 MED ORDER — CYCLOBENZAPRINE HCL 5 MG PO TABS: 5.0000 mg | ORAL_TABLET | Freq: Three times a day (TID) | ORAL | 0 refills | Status: DC | PRN

## 2018-09-20 MED ORDER — PREDNISONE 20 MG PO TABS: 40.0000 mg | ORAL_TABLET | Freq: Every day | ORAL | 0 refills | Status: DC

## 2018-09-20 NOTE — Assessment & Plan Note (Signed)
Chronic issue.  Symptoms seem to indicate nerve impingement.  Will trial prednisone and a muscle relaxer.  If not improving she will need to follow-up with her surgeon.  Given return precautions.

## 2018-09-20 NOTE — Assessment & Plan Note (Signed)
I suspect this is related to amlodipine or venous insufficiency.  We will have her discontinue her amlodipine.  She will monitor her blood pressure daily for the next week.  She will return for BP check in 1 week.

## 2018-09-20 NOTE — Patient Instructions (Signed)
Nice to see you. We will check an x-ray of your left knee.  We will treat you back with Flexeril which is a muscle relaxer and prednisone which is an anti-inflammatory steroid.  The Flexeril may make you drowsy so please be careful and do not drive if you do get drowsy with this. We will have you stop your amlodipine.  This may be contributing to your swelling.  Please check your blood pressure daily for the next week and we will have you return for nurse BP check. If you develop numbness, weakness, loss of bowel or bladder function, or worsening pain please be reevaluated.

## 2018-09-20 NOTE — Assessment & Plan Note (Signed)
Suspect OA.  X-ray to be completed today.

## 2018-09-20 NOTE — Progress Notes (Signed)
[name], MD Phone: [phone]  Ashley [name] is a 78 y.o. female who presents today for same-day visit.  CC: Left knee pain, low back pain, bilateral foot swelling  Left knee pain: This is an acute issue though there is no specific injury.  Notes mostly aches at night or when she has been resting and then gets up to move around.  She will put topical rubs on it which helps some.  Notes it is stiff.  Low back pain: She has chronic low back pain though has had an exacerbation over the last 2 weeks.  She notes no injury.  She notes if she moves a specific way it will catch.  And if she walks and turns it will grab.  She has been doing more physical activity around the house.  She does note radiation down the front of her left leg.  She has had similar symptoms previously though they seem to have worsened recently.  She has had a back surgery about 10 months ago.  No numbness or weakness.  No incontinence.  No fevers.  Bilateral foot swelling: This continues to occur.  She notes no shortness of breath, orthopnea, or PND.  Prior lab work was unremarkable.  Possibly related to venous insufficiency or amlodipine.  Social History   Tobacco Use  Smoking Status Former Smoker  . Packs/day: 0.50  . Years: 15.00  . Pack years: 7.50  . Types: Cigarettes  . Last attempt to quit: 08/07/1976  . Years since quitting: 42.1  Smokeless Tobacco Never Used     ROS see history of present illness  Objective  Physical Exam Vitals:   09/20/18 1312  BP: 108/70  Pulse: 86  Temp: 98.6 F (37 C)  SpO2: 96%    BP Readings from Last 3 Encounters:  09/20/18 108/70  08/28/18 126/80  06/11/18 120/72   Wt Readings from Last 3 Encounters:  09/20/18 153 lb 3.2 oz (69.5 kg)  08/28/18 153 lb 6.4 oz (69.6 kg)  05/28/18 149 lb 9.6 oz (67.9 kg)    Physical Exam Constitutional:      General: She is not in acute distress.    Appearance: She is not diaphoretic.  Cardiovascular:     Rate and  Rhythm: Normal rate and regular rhythm.     Heart sounds: Normal heart sounds.  Pulmonary:     Effort: Pulmonary effort is normal.     Breath sounds: Normal breath sounds.  Musculoskeletal:     Comments: No midline spine tenderness, no midline spine step-off, no muscular back tenderness, left knee with slight medial joint line tenderness, no ligamentous laxity, negative McMurray's, no other tenderness, no warmth or swelling  Skin:    General: Skin is warm and dry.  Neurological:     Mental Status: She is alert.     Comments: 5/5 strength bilateral quads, hamstrings, plantar flexion, and dorsiflexion, sensation light touch intact bilateral lower extremities      Assessment/Plan: Please see individual problem list.  Chronic low back pain Chronic issue.  Symptoms seem to indicate nerve impingement.  Will trial prednisone and a muscle relaxer.  If not improving she will need to follow-up with her surgeon.  Given return precautions.  Left knee pain Suspect OA.  X-ray to be completed today.  Bilateral leg edema I suspect this is related to amlodipine or venous insufficiency.  We will have her discontinue her amlodipine.  She will monitor her blood pressure daily for the next week.  She  will return for BP check in 1 week.   Orders Placed This Encounter  Procedures  . DG Knee Complete 4 Views Left    Standing Status:   Future    Number of Occurrences:   1    Standing Expiration Date:   11/19/2019    Order Specific Question:   Reason for Exam (SYMPTOM  OR DIAGNOSIS REQUIRED)    Answer:   left knee pain, acute onset, no injury    Order Specific Question:   Preferred imaging location?    Answer:   [employer] Specific Question:   Radiology Contrast Protocol - do NOT remove file path    Answer:   [email or web address]    Meds ordered this encounter  Medications  . predniSONE (DELTASONE) 20 MG tablet    Sig: Take 2 tablets  (40 mg total) by mouth daily with breakfast.    Dispense:  10 tablet    Refill:  0  . cyclobenzaprine (FLEXERIL) 5 MG tablet    Sig: Take 1 tablet (5 mg total) by mouth 3 (three) times daily as needed for muscle spasms.    Dispense:  20 tablet    Refill:  0     [name], MD [facility]

## 2018-09-24 ENCOUNTER — Other Ambulatory Visit: Payer: Self-pay | Admitting: Family Medicine

## 2018-09-24 DIAGNOSIS — M1712 Unilateral primary osteoarthritis, left knee: Secondary | ICD-10-CM

## 2018-10-02 ENCOUNTER — Ambulatory Visit (INDEPENDENT_AMBULATORY_CARE_PROVIDER_SITE_OTHER): Payer: Medicare HMO | Admitting: *Deleted

## 2018-10-02 ENCOUNTER — Telehealth: Payer: Self-pay

## 2018-10-02 ENCOUNTER — Other Ambulatory Visit: Payer: Self-pay | Admitting: Family Medicine

## 2018-10-02 VITALS — BP 108/72 | HR 91 | Resp 20

## 2018-10-02 DIAGNOSIS — I10 Essential (primary) hypertension: Secondary | ICD-10-CM

## 2018-10-02 NOTE — Telephone Encounter (Signed)
Copied from CRM [id number]. Topic: General - Inquiry >> Oct 02, 2018  3:24 PM Maia Petties wrote: Reason for CRM: pt calling to see if Dr. Birdie Sons approved MRI that she requested with the nurse at BP check this morning.

## 2018-10-02 NOTE — Progress Notes (Signed)
Patient here for nurse visit BP check per order from 09/20/18  Patient reports compliance with prescribed BP medications: yes  Last dose of BP medication: This AM  BP Readings from Last 3 Encounters:  09/20/18 108/70  08/28/18 126/80  06/11/18 120/72   Pulse Readings from Last 3 Encounters:  09/20/18 86  08/28/18 76  06/11/18 88      Patient verbalized understanding of instructions.   Henrene Pastor, LPN

## 2018-10-03 DIAGNOSIS — M25462 Effusion, left knee: Secondary | ICD-10-CM | POA: Diagnosis not present

## 2018-10-03 DIAGNOSIS — M25562 Pain in left knee: Secondary | ICD-10-CM | POA: Diagnosis not present

## 2018-10-03 DIAGNOSIS — M1712 Unilateral primary osteoarthritis, left knee: Secondary | ICD-10-CM | POA: Diagnosis not present

## 2018-10-03 NOTE — Progress Notes (Signed)
Blood pressure is adequately controlled.  She should continue her current regimen. 

## 2018-10-04 NOTE — Telephone Encounter (Signed)
Pt states she would call the provider who did her back surgery, to disregard message.  Talbert Nan

## 2018-10-07 ENCOUNTER — Telehealth: Payer: Self-pay

## 2018-10-07 NOTE — Progress Notes (Signed)
Patient notified and voiced understanding.

## 2018-10-07 NOTE — Telephone Encounter (Signed)
Copied from CRM [id number]. Topic: Referral - Request for Referral >> Oct 07, 2018  3:50 PM Jaquita Rector A wrote: Has patient seen PCP for this complaint? Yes.   *If NO, is insurance requiring patient see PCP for this issue before PCP can refer them? Referral for which specialty: MRI Preferred provider/office: None specified Reason for referral: back pain   Patient said she does not want to go to [facility] to have this done because of the distance. Asking for something closer. Say that she is having lots of back pain

## 2018-10-08 NOTE — Telephone Encounter (Signed)
Pt was seen recently for lower back pain on 09/20/2018. Sent to covering PA due to PCP being out for [place] for referral.

## 2018-10-08 NOTE — Telephone Encounter (Signed)
I need more info  Looks like she has orthopedic referral pending  Is she looking to see neurosurgery? If yes she most likely will have to follow up here first to get re-evaluated and have MRI done before a neurosurgeon will see her

## 2018-10-09 DIAGNOSIS — H43311 Vitreous membranes and strands, right eye: Secondary | ICD-10-CM | POA: Diagnosis not present

## 2018-10-09 DIAGNOSIS — H43313 Vitreous membranes and strands, bilateral: Secondary | ICD-10-CM | POA: Diagnosis not present

## 2018-10-09 DIAGNOSIS — H43391 Other vitreous opacities, right eye: Secondary | ICD-10-CM | POA: Diagnosis not present

## 2018-10-09 DIAGNOSIS — I10 Essential (primary) hypertension: Secondary | ICD-10-CM | POA: Diagnosis not present

## 2018-10-09 DIAGNOSIS — Z961 Presence of intraocular lens: Secondary | ICD-10-CM | POA: Diagnosis not present

## 2018-10-09 NOTE — Telephone Encounter (Signed)
Called and spoke with patient. Pt stated that she wanted the MRI due to just having surgery last Thursday on her back back she is having pain and the pain is radiating down her legs and she has been active than usual and unsure if she injected something. Pt stated that she had a bulging disk and arthritis.   Pt stated that she reached out to her surgeon is regards to having an MRI done but they require an OV first then the doctor can place referral for MRI due to insurance reasons. Pt did not schedule an appt with surgeon for the MRI.  Pt advised that PCP is not in the office and she is willing to hold off for now she stated that she was Rx'd mobic and will continue to take this to see if this will help.

## 2018-10-11 NOTE — Telephone Encounter (Signed)
Pt calling to see if Dr. Birdie Sons will order for her to have the MRI done? Pt aware Dr. Birdie Sons is out of the office until Monday.

## 2018-10-11 NOTE — Telephone Encounter (Signed)
Sent to PCP to advise 

## 2018-10-13 NOTE — Telephone Encounter (Signed)
I would advise rechecking her in the office to determine the severity of her symptoms and to determine how promptly she would need the MRI.

## 2018-10-14 NOTE — Telephone Encounter (Signed)
Called and spoke with patient. Pt advised and has been scheduled for an appt.

## 2018-10-17 ENCOUNTER — Ambulatory Visit (INDEPENDENT_AMBULATORY_CARE_PROVIDER_SITE_OTHER): Payer: Medicare HMO | Admitting: Family Medicine

## 2018-10-17 ENCOUNTER — Other Ambulatory Visit: Payer: Self-pay

## 2018-10-17 VITALS — BP 148/90 | HR 85 | Temp 98.5°F | Wt 154.4 lb

## 2018-10-17 DIAGNOSIS — M545 Low back pain, unspecified: Secondary | ICD-10-CM

## 2018-10-17 DIAGNOSIS — G8929 Other chronic pain: Secondary | ICD-10-CM

## 2018-10-17 DIAGNOSIS — M5416 Radiculopathy, lumbar region: Secondary | ICD-10-CM

## 2018-10-20 ENCOUNTER — Encounter: Payer: Self-pay | Admitting: Family Medicine

## 2018-10-20 NOTE — Progress Notes (Signed)
  [name], MD Phone: [phone]  Ashley [name] is a 78 y.o. female who presents today for f/u.  Low back pain: Patient notes she has had issues with this over the last 2 months.  She had surgery last year and wonders if she has done something to reinjure herself.  She notes right lower back pain that "kills" her when she lays down.  If she sits down it feels stiff and feels like a nerve is hit.  She notes she has pain radiating down the back of her left leg that occurs 2 times a day for the last couple weeks.  No numbness, weakness, or incontinence.  She has been taking meloxicam with some benefit.  No fevers.  Social History   Tobacco Use  Smoking Status Former Smoker  . Packs/day: 0.50  . Years: 15.00  . Pack years: 7.50  . Types: Cigarettes  . Last attempt to quit: 08/07/1976  . Years since quitting: 42.2  Smokeless Tobacco Never Used     ROS see history of present illness  Objective  Physical Exam Vitals:   10/20/18 0941  BP: (!) 148/90  Pulse: 85  Temp: 98.5 F (36.9 C)  SpO2: 95%    BP Readings from Last 3 Encounters:  10/20/18 (!) 148/90  10/02/18 108/72  09/20/18 108/70   Wt Readings from Last 3 Encounters:  10/20/18 154 lb 6.4 oz (70 kg)  09/20/18 153 lb 3.2 oz (69.5 kg)  08/28/18 153 lb 6.4 oz (69.6 kg)    Physical Exam Constitutional:      General: She is not in acute distress.    Appearance: She is not diaphoretic.  Cardiovascular:     Rate and Rhythm: Normal rate and regular rhythm.     Heart sounds: Normal heart sounds.  Pulmonary:     Effort: Pulmonary effort is normal.     Breath sounds: Normal breath sounds.  Musculoskeletal:     Comments: No midline spine tenderness, no midline spine step-off, mild right lumbar muscular back tenderness  Skin:    General: Skin is warm and dry.  Neurological:     Mental Status: She is alert.     Comments: 5/5 strength bilateral quads, hamstrings, plantar flexion, and dorsiflexion, sensation  light touch intact bilateral lower extremities, 2+ patellar reflexes      Assessment/Plan: Please see individual problem list.  Chronic low back pain Patient has had worsening of symptoms.  She now has radicular symptoms.  We will proceed with an MRI.  Given return precautions.   Orders Placed This Encounter  Procedures  . MR Lumbar Spine Wo Contrast    Standing Status:   Future    Standing Expiration Date:   12/20/2019    Order Specific Question:   What is the patient's sedation requirement?    Answer:   No Sedation    Order Specific Question:   Does the patient have a pacemaker or implanted devices?    Answer:   No    Order Specific Question:   Preferred imaging location?    Answer:   [facility] (table limit-400lbs)    Order Specific Question:   Radiology Contrast Protocol - do NOT remove file path    Answer:   \\[email or web address]    No orders of the defined types were placed in this encounter.    [name], MD [facility]

## 2018-10-20 NOTE — Assessment & Plan Note (Signed)
Patient has had worsening of symptoms.  She now has radicular symptoms.  We will proceed with an MRI.  Given return precautions.

## 2018-10-25 ENCOUNTER — Telehealth: Payer: Self-pay | Admitting: Family Medicine

## 2018-10-25 NOTE — Telephone Encounter (Signed)
I am happy to do this though I need confirmation that she will have somebody to the MRI and home from the MRI.

## 2018-10-25 NOTE — Telephone Encounter (Signed)
Confirmed with pt that she will have someone take her and bring her home from the MRI. Please send to [facility]. Also, she states that you are seeing her husband Ashley [name] Monday (MRN [id number])) and wants to know if you can get the information for the diet that you wanted him to start. She is not sure of the name but believes it is "go low". States that you couldn't get the information for him on his last appointment bc the computers were down.

## 2018-10-25 NOTE — Telephone Encounter (Signed)
Pt has her MRI scheduled on 4/20 @ [facility]. She is aware. 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 She states that if you can call it in now she will go ahead and pick it up and hold on to it until her appointment. Pt cb [phone]

## 2018-10-26 MED ORDER — ALPRAZOLAM 0.5 MG PO TABS: 0.5000 mg | ORAL_TABLET | Freq: Once | ORAL | 0 refills | Status: AC

## 2018-10-26 NOTE — Telephone Encounter (Signed)
Sent to pharmacy. We can discuss the diet when he is in the office.

## 2018-10-28 NOTE — Telephone Encounter (Signed)
Xanax was sent to her pharmacy.

## 2018-10-28 NOTE — Telephone Encounter (Signed)
What did you send in Dr. Birdie Sons?   Called pt and left a VM to call back. CRM created and sent to [facility] pool.

## 2018-10-29 NOTE — Telephone Encounter (Signed)
Called and spoke with pt. Pt advised.  

## 2018-11-05 ENCOUNTER — Telehealth: Payer: Self-pay | Admitting: Family Medicine

## 2018-11-05 NOTE — Telephone Encounter (Signed)
Copied from CRM [id number]. Topic: Quick Communication - Rx Refill/Question >> Nov 05, 2018  1:23 PM Baldo Daub L wrote: Medication: Amlodopine  Pt called and left message on [facility] mailbox.  States that her pharmacy reached out to her and told her it was time to refill her Amlodopine, but she isn't sure she is supposed to be taking that and would like someone to call her and back and let her know what blood pressure medication she is supposed to be on. Pt can be reached at [phone].

## 2018-11-05 NOTE — Telephone Encounter (Signed)
Called and spoke with pt. Pt was advised that she was taken off the amlodipine back in February 14,2020 due to swelling which that medication can cause pt was to discontinue that medication at that time. Pt advised and voiced understanding. The only BP medication the patient is on now is HCTZ and pt did come in for a BP check on 10/02/2018 an at that time her BP did look normal.

## 2018-11-08 ENCOUNTER — Other Ambulatory Visit: Payer: Self-pay | Admitting: Family Medicine

## 2018-11-21 ENCOUNTER — Other Ambulatory Visit: Payer: Self-pay | Admitting: Family Medicine

## 2018-11-21 MED ORDER — OMEPRAZOLE 20 MG PO CPDR: 20.0000 mg | DELAYED_RELEASE_CAPSULE | Freq: Every day | ORAL | 1 refills | Status: DC

## 2018-11-22 ENCOUNTER — Ambulatory Visit
Admission: RE | Admit: 2018-11-22 | Discharge: 2018-11-22 | Disposition: A | Discharge status: Home / Self Care | Payer: Medicare HMO | Source: Ambulatory Visit | Attending: Family Medicine | Admitting: Family Medicine

## 2018-11-22 ENCOUNTER — Other Ambulatory Visit: Payer: Self-pay

## 2018-11-22 DIAGNOSIS — M5416 Radiculopathy, lumbar region: Secondary | ICD-10-CM | POA: Diagnosis not present

## 2018-11-22 DIAGNOSIS — M4807 Spinal stenosis, lumbosacral region: Secondary | ICD-10-CM | POA: Diagnosis not present

## 2018-11-22 DIAGNOSIS — M48061 Spinal stenosis, lumbar region without neurogenic claudication: Secondary | ICD-10-CM | POA: Diagnosis not present

## 2018-11-22 DIAGNOSIS — Z981 Arthrodesis status: Secondary | ICD-10-CM | POA: Diagnosis not present

## 2018-11-25 ENCOUNTER — Ambulatory Visit: Payer: Medicare HMO

## 2018-12-05 DIAGNOSIS — M25562 Pain in left knee: Secondary | ICD-10-CM | POA: Diagnosis not present

## 2018-12-05 DIAGNOSIS — M1712 Unilateral primary osteoarthritis, left knee: Secondary | ICD-10-CM | POA: Diagnosis not present

## 2018-12-05 DIAGNOSIS — G8929 Other chronic pain: Secondary | ICD-10-CM | POA: Diagnosis not present

## 2018-12-05 DIAGNOSIS — M25462 Effusion, left knee: Secondary | ICD-10-CM | POA: Diagnosis not present

## 2018-12-24 ENCOUNTER — Ambulatory Visit: Payer: Medicare HMO

## 2018-12-26 DIAGNOSIS — M5386 Other specified dorsopathies, lumbar region: Secondary | ICD-10-CM | POA: Diagnosis not present

## 2018-12-26 DIAGNOSIS — M5417 Radiculopathy, lumbosacral region: Secondary | ICD-10-CM | POA: Diagnosis not present

## 2018-12-26 DIAGNOSIS — Z9889 Other specified postprocedural states: Secondary | ICD-10-CM | POA: Diagnosis not present

## 2018-12-26 DIAGNOSIS — M5136 Other intervertebral disc degeneration, lumbar region: Secondary | ICD-10-CM | POA: Diagnosis not present

## 2018-12-26 DIAGNOSIS — M545 Low back pain: Secondary | ICD-10-CM | POA: Diagnosis not present

## 2019-01-07 DIAGNOSIS — M48062 Spinal stenosis, lumbar region with neurogenic claudication: Secondary | ICD-10-CM | POA: Diagnosis not present

## 2019-01-07 DIAGNOSIS — M5136 Other intervertebral disc degeneration, lumbar region: Secondary | ICD-10-CM | POA: Diagnosis not present

## 2019-01-07 DIAGNOSIS — M5416 Radiculopathy, lumbar region: Secondary | ICD-10-CM | POA: Diagnosis not present

## 2019-01-16 ENCOUNTER — Ambulatory Visit: Payer: Self-pay | Admitting: Urology

## 2019-01-20 DIAGNOSIS — G5603 Carpal tunnel syndrome, bilateral upper limbs: Secondary | ICD-10-CM | POA: Diagnosis not present

## 2019-01-22 DIAGNOSIS — H43391 Other vitreous opacities, right eye: Secondary | ICD-10-CM | POA: Diagnosis not present

## 2019-01-22 DIAGNOSIS — Z961 Presence of intraocular lens: Secondary | ICD-10-CM | POA: Diagnosis not present

## 2019-01-22 DIAGNOSIS — H43312 Vitreous membranes and strands, left eye: Secondary | ICD-10-CM | POA: Diagnosis not present

## 2019-01-22 DIAGNOSIS — H43392 Other vitreous opacities, left eye: Secondary | ICD-10-CM | POA: Diagnosis not present

## 2019-02-04 ENCOUNTER — Ambulatory Visit: Payer: Self-pay | Admitting: Urology

## 2019-02-11 DIAGNOSIS — M5416 Radiculopathy, lumbar region: Secondary | ICD-10-CM | POA: Diagnosis not present

## 2019-02-11 DIAGNOSIS — M48062 Spinal stenosis, lumbar region with neurogenic claudication: Secondary | ICD-10-CM | POA: Diagnosis not present

## 2019-02-11 DIAGNOSIS — M5136 Other intervertebral disc degeneration, lumbar region: Secondary | ICD-10-CM | POA: Diagnosis not present

## 2019-02-18 DIAGNOSIS — R200 Anesthesia of skin: Secondary | ICD-10-CM | POA: Diagnosis not present

## 2019-02-20 ENCOUNTER — Other Ambulatory Visit: Payer: Self-pay

## 2019-02-20 ENCOUNTER — Emergency Department
Admission: EM | Admit: 2019-02-20 | Discharge: 2019-02-21 | Disposition: A | Discharge status: Home / Self Care | Payer: Medicare HMO | Attending: Emergency Medicine | Admitting: Emergency Medicine

## 2019-02-20 DIAGNOSIS — R220 Localized swelling, mass and lump, head: Secondary | ICD-10-CM | POA: Diagnosis not present

## 2019-02-20 DIAGNOSIS — Z87891 Personal history of nicotine dependence: Secondary | ICD-10-CM | POA: Diagnosis not present

## 2019-02-20 DIAGNOSIS — I10 Essential (primary) hypertension: Secondary | ICD-10-CM | POA: Insufficient documentation

## 2019-02-20 DIAGNOSIS — T7840XA Allergy, unspecified, initial encounter: Secondary | ICD-10-CM | POA: Diagnosis present

## 2019-02-20 DIAGNOSIS — T781XXA Other adverse food reactions, not elsewhere classified, initial encounter: Secondary | ICD-10-CM | POA: Diagnosis not present

## 2019-02-20 MED ORDER — DIPHENHYDRAMINE HCL 50 MG/ML IJ SOLN
50.0000 mg | Freq: Once | INTRAMUSCULAR | Status: AC
  Administered 2019-02-20: 22:00:00 50 mg via INTRAVENOUS
  Filled 2019-02-20: qty 1

## 2019-02-20 MED ORDER — SODIUM CHLORIDE 0.9 % IV BOLUS
1000.0000 mL | Freq: Once | INTRAVENOUS | Status: AC
  Administered 2019-02-20: 22:00:00 1000 mL via INTRAVENOUS

## 2019-02-20 MED ORDER — EPINEPHRINE 0.3 MG/0.3ML IJ SOAJ
0.3000 mg | Freq: Once | INTRAMUSCULAR | Status: AC
  Administered 2019-02-20: 0.3 mg via INTRAMUSCULAR
  Filled 2019-02-20: qty 0.3

## 2019-02-20 MED ORDER — PREDNISONE 20 MG PO TABS: 40.0000 mg | ORAL_TABLET | Freq: Every day | ORAL | 0 refills | Status: DC

## 2019-02-20 MED ORDER — EPINEPHRINE 0.3 MG/0.3ML IJ SOAJ: 0.3000 mg | INTRAMUSCULAR | 1 refills | Status: AC | PRN

## 2019-02-20 MED ORDER — FAMOTIDINE IN NACL 20-0.9 MG/50ML-% IV SOLN
20.0000 mg | Freq: Once | INTRAVENOUS | Status: AC
  Administered 2019-02-20: 20 mg via INTRAVENOUS
  Filled 2019-02-20: qty 50

## 2019-02-20 MED ORDER — METHYLPREDNISOLONE SODIUM SUCC 125 MG IJ SOLR
125.0000 mg | Freq: Once | INTRAMUSCULAR | Status: AC
  Administered 2019-02-20: 22:00:00 125 mg via INTRAVENOUS
  Filled 2019-02-20: qty 2

## 2019-02-20 NOTE — ED Notes (Signed)
Pt reports eating chinese food at 1700 today.  At 2100 pt started having swelling to her tongue.  Pt reports taking lisinopril also.  md at bedside.  Iv started and meds given.  nsr on monitor.   No resp distress.  Tongue is swollen.  Pt alert.

## 2019-02-20 NOTE — ED Notes (Signed)
Pt states feeling better.  Swelling improved of tongue.  Iv fluid infusing

## 2019-02-20 NOTE — ED Provider Notes (Signed)
[facility] Emergency Department Provider Note  Time seen: 10:04 PM  I have reviewed the triage vital signs and the nursing notes.   HISTORY  Chief Complaint Allergic Reaction   HPI Ashley [name] is a 78 y.o. female with a past medical history of anxiety, gastric reflux, hypertension, hyperlipidemia presents emergency department for possible allergic reaction.  According to the patient she was eating [place] food approximately 1 hour ago when she began feeling some swelling in her tongue.  States she has had this happen several times in the past due to allergic reactions to unknown causes.  Patient states  no trouble breathing but does state mild trouble swallowing due to the tongue swelling.  Does slightly disrupted speech due to tongue swelling.  Past Medical History:  Diagnosis Date  . Abnormal uterine bleeding (AUB)    a. benign endometrial polyps on D+C in 03/2017  . Anxiety   . Arthritis   . Chicken pox   . Depression   . GERD (gastroesophageal reflux disease)   . History of stress test    a. 08/2017 Lexiscan MV: EF 55-65%. No ischemia/infarct. Low risk.  Marland Kitchen Hyperlipidemia   . Hypertension   . 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 Dx in setting of dizziness/vertigo; b. 02/2018 Carotid U/S: bilateral intimal thickening and atherosclerotic plaque. No hemodynamically significant stenoses; c. 03/2018 MRI Brain: No mass or abnl enhancement. Chronic microvascular ischemic changes and mod volume loss of the brain. 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  . Vertigo     Patient Active Problem List   Diagnosis Date Noted  . Left knee pain 09/20/2018  . Hypertriglyceridemia 08/28/2018  . Partial tear of left rotator cuff 08/28/2018  . Plantar fasciitis of right foot 05/17/2018  . De Quervain's tenosynovitis, right 05/17/2018  . Bilateral knee pain 04/23/2018  . Left arm pain 03/18/2018  . Bilateral leg edema 10/11/2017  . Prediabetes 10/11/2017   . Chronic low back pain 10/11/2017  . Vertigo 10/11/2017  . Post-menopausal bleeding 07/09/2017  . Neck pain 07/09/2017  . Atypical chest pain 04/24/2017  . Jaw pain 04/24/2017  . Family history of ovarian cancer 01/16/2017  . Essential hypertension 01/16/2017  . GERD (gastroesophageal reflux disease) 01/16/2017  . Depression 12/28/2014  . Hyperlipidemia 12/28/2014    Past Surgical History:  Procedure Laterality Date  . DILATATION & CURETTAGE/HYSTEROSCOPY WITH MYOSURE N/A 03/30/2017   Procedure: DILATATION & CURETTAGE/HYSTEROSCOPY WITH MYOSURE;  Surgeon: Schermerhorn, Gwen Her, MD;  Location: [facility] ORS;  Service: Gynecology;  Laterality: N/A;  . EYE SURGERY Bilateral    Cataract Extraction with IOL  . SKIN LESION EXCISION    . TONSILLECTOMY AND ADENOIDECTOMY     Age 52    Prior to Admission medications   Medication Sig Start Date End Date Taking? Authorizing Provider  aspirin EC 81 MG tablet Take 81 mg by mouth daily.    [provider]  cyclobenzaprine (FLEXERIL) 5 MG tablet Take 1 tablet (5 mg total) by mouth 3 (three) times daily as needed for muscle spasms. 09/20/18   Leone Haven, MD  fluticasone (FLONASE) 50 MCG/ACT nasal spray Place 2 sprays into both nostrils daily. 03/26/18   Crecencio Mc, MD  gabapentin (NEURONTIN) 100 MG capsule Take 1 capsule (100 mg total) by mouth at bedtime. Patient taking differently: Take 100 mg by mouth at bedtime.  01/24/18   McLean-Scocuzza, Nino Glow, MD  hydrochlorothiazide (HYDRODIURIL) 25 MG tablet Take 1 tablet (  25 mg total) by mouth daily. 04/15/16   Minna AntisPaduchowski, Kevin, MD  meloxicam (MOBIC) 15 MG tablet Take by mouth. 03/09/18   [provider]  omeprazole (PRILOSEC) 20 MG capsule Take 1 capsule (20 mg total) by mouth daily. 11/21/18   Glori LuisSonnenberg, Eric G, MD  predniSONE (DELTASONE) 20 MG tablet Take 2 tablets (40 mg total) by mouth daily with breakfast. 09/20/18   Glori LuisSonnenberg, Eric G, MD  progesterone (PROMETRIUM) 100 MG  capsule Take 100 mg by mouth daily.    [provider]  rosuvastatin (CRESTOR) 40 MG tablet TAKE 1 TABLET BY MOUTH DAILY 11/08/18   Glori LuisSonnenberg, Eric G, MD  venlafaxine XR (EFFEXOR-XR) 150 MG 24 hr capsule TAKE 1 CAPSULE BY MOUTH ONCE DAILY 10/02/18   Glori LuisSonnenberg, Eric G, MD    Allergies  Allergen Reactions  . Ace Inhibitors     Angioedema  . Pravastatin Other (See Comments)    Other reaction(s): Muscle Pain  . Simvastatin Other (See Comments)    stiffness    Family History  Problem Relation Age of Onset  . Rheum arthritis Mother   . Ovarian cancer Sister   . COPD Sister   . CAD Sister   . Heart disease Father   . Aneurysm Father   . Cancer Brother   . Thrombosis Maternal Grandmother        a. DVT  . Thrombosis Maternal Grandfather        a. DVT  . Thrombosis Paternal Grandmother        a. DVT  . Thrombosis Paternal Grandfather        a. DVT    Social History Social History   Tobacco Use  . Smoking status: Former Smoker    Packs/day: 0.50    Years: 15.00    Pack years: 7.50    Types: Cigarettes    Quit date: 08/07/1976    Years since quitting: 42.5  . Smokeless tobacco: Never Used  Substance Use Topics  . Alcohol use: Yes    Comment: ocassionally  . Drug use: No    Review of Systems Constitutional: Negative for fever. ENT: Tongue swelling. Cardiovascular: Negative for chest pain. Respiratory: Negative for shortness of breath.  Negative for cough. Gastrointestinal: Negative for abdominal pain Musculoskeletal: Negative for musculoskeletal complaints Skin: Negative for skin complaints  Neurological: Negative for headache All other ROS negative  ____________________________________________   PHYSICAL EXAM:  VITAL SIGNS: ED Triage Vitals  Enc Vitals Group     BP 02/20/19 2153 (!) 141/93     Pulse Rate 02/20/19 2153 96     Resp 02/20/19 2153 19     Temp --      Temp src --      SpO2 02/20/19 2153 96 %     Weight 02/20/19 2154 150 lb (68 kg)      Height 02/20/19 2154 4\' 11"  (1.499 m)     Head Circumference --      Peak Flow --      Pain Score 02/20/19 2154 6     Pain Loc --      Pain Edu? --      Excl. in GC? --     Constitutional: Alert and oriented. Well appearing and in no distress. Eyes: Normal exam ENT      Head: Normocephalic and atraumatic.      Mouth/Throat: Mucous membranes are moist.  Moderately swollen tongue.  Continues to have a patent airway. Cardiovascular: Normal rate, regular rhythm. Respiratory: Normal respiratory  effort without tachypnea nor retractions. Breath sounds are clear Gastrointestinal: Soft and nontender. No distention.   Musculoskeletal: Nontender with normal range of motion in all extremities.  Neurologic:  Normal speech and language. No gross focal neurologic deficits are appreciated. Skin:  Skin is warm, dry and intact.  Psychiatric: Mood and affect are normal. Speech and behavior are normal.   ____________________________________________   INITIAL IMPRESSION / ASSESSMENT AND PLAN / ED COURSE  Pertinent labs & imaging results that were available during my care of the patient were reviewed by me and considered in my medical decision making (see chart for details).   Patient presents emergency department after likely allergic reaction.  Moderately swollen tongue but is not obstructive.  Given the patient tongue swelling we will dose IM epinephrine, IV Solu-Medrol, Benadryl, Pepcid and fluids.  Patient agreeable to plan we will very closely monitor the patient in the emergency department.  If the patient improves we would discharge with steroids and EpiPen prescriptions.  Patient care signed out to oncoming physician.  Pricilla Riffleatricia A Hindes was evaluated in Emergency Department on 02/20/2019 for the symptoms described in the history of present illness. She was evaluated in the context of the global COVID-19 pandemic, which necessitated consideration that the patient might be at risk for infection with  the SARS-CoV-2 virus that causes COVID-19. Institutional protocols and algorithms that pertain to the evaluation of patients at risk for COVID-19 are in a state of rapid change based on information released by regulatory bodies including the CDC and federal and state organizations. These policies and algorithms were followed during the patient's care in the ED.  ____________________________________________   FINAL CLINICAL IMPRESSION(S) / ED DIAGNOSES  Allergic reaction   Minna AntisPaduchowski, Kevin, MD 02/20/19 2216

## 2019-02-20 NOTE — ED Notes (Signed)
Pt on phone with family  

## 2019-02-20 NOTE — ED Triage Notes (Signed)
Patient c/o oral swelling with slurred speech, throat tightness. Patient coughing due to throat discomfort. Patient reports onset of symptoms 1 hour ago. Patient reports allergies to [place] food - patient ate [place]. patient reports she thinks she takes Lisinopril

## 2019-02-21 NOTE — ED Notes (Signed)
Pt tongue is significantly less swollen then on arrival. EDP at bedside.

## 2019-02-21 NOTE — ED Provider Notes (Signed)
I assumed care of the patient from Dr. Kerman Passey at 11:00 PM.  Patient evaluated with no evidence of any tongue swelling at this time.  Patient denies any difficulty breathing or swallowing.  Dr. Kerman Passey prescribed both the EpiPen and prednisone for the patient.  Spoke with the patient at length regarding use of the EpiPen.  Patient be discharged home outpatient follow-up   Gregor Hams, MD 02/21/19 [phone]

## 2019-02-24 ENCOUNTER — Encounter: Payer: Self-pay | Admitting: Urology

## 2019-02-24 ENCOUNTER — Other Ambulatory Visit: Payer: Self-pay

## 2019-02-24 ENCOUNTER — Ambulatory Visit: Payer: Medicare HMO | Admitting: Urology

## 2019-02-24 VITALS — BP 130/82 | HR 106 | Ht 59.0 in | Wt 151.0 lb

## 2019-02-24 DIAGNOSIS — N390 Urinary tract infection, site not specified: Secondary | ICD-10-CM | POA: Diagnosis not present

## 2019-02-24 DIAGNOSIS — N3946 Mixed incontinence: Secondary | ICD-10-CM | POA: Diagnosis not present

## 2019-02-24 LAB — URINALYSIS, COMPLETE
Bilirubin, UA: NEGATIVE
Glucose, UA: NEGATIVE
Ketones, UA: NEGATIVE
Leukocytes,UA: NEGATIVE
Nitrite, UA: NEGATIVE
Protein,UA: NEGATIVE
Specific Gravity, UA: 1.015 (ref 1.005–1.030)
Urobilinogen, Ur: 0.2 mg/dL (ref 0.2–1.0)
pH, UA: 6.5 (ref 5.0–7.5)

## 2019-02-24 LAB — MICROSCOPIC EXAMINATION: Bacteria, UA: NONE SEEN

## 2019-02-24 LAB — BLADDER SCAN AMB NON-IMAGING

## 2019-02-24 NOTE — Progress Notes (Signed)
02/24/2019 3:32 PM   Ashley [name] 1940/10/26 [id number]  Referring provider: Leone Haven, MD [address] [place],  [place] [address]  No chief complaint on file.   HPI: Dr Francella Solian. patient had a normal CT scan for pain in 2017 and has had Macroplastique by Dr. Eliberto Ivory in the past I was consulted to assess the patient urinary incontinence.  She can leak with coughing sneezing but not bending and lifting.  If she starts to choke she will leak.  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  She states she will go a whole week without leaking.  She can have little bit urge incontinence and denies bedwetting  I think she voids every 1-2 hours or longer.  She was a bit nonspecific and drinks a lot of fluid.  She gets up twice at night  The treatment noted above worked very well and she wondered if she needed another treatment.  She has had low back surgery.  She denies history of kidney stones and previous GU surgery.  Has not had bladder infections.  Has not had a hysterectomy.  Presentation not medically treated   PMH: Past Medical History:  Diagnosis Date  . Abnormal uterine bleeding (AUB)    a. benign endometrial polyps on D+C in 03/2017  . Anxiety   . Arthritis   . Chicken pox   . Depression   . GERD (gastroesophageal reflux disease)   . History of stress test    a. 08/2017 Lexiscan MV: EF 55-65%. No ischemia/infarct. Low risk.  Marland Kitchen Hyperlipidemia   . Hypertension   . Lacunar infarction ([place])    a. Dx in setting of dizziness/vertigo; b. 02/2018 Carotid U/S: bilateral intimal thickening and atherosclerotic plaque. No hemodynamically significant stenoses; c. 03/2018 MRI Brain: No mass or abnl enhancement. Chronic microvascular ischemic changes and mod volume loss of the brain. 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  . Vertigo     Surgical History: Past Surgical History:  Procedure Laterality Date  . DILATATION & CURETTAGE/HYSTEROSCOPY  WITH MYOSURE N/A 03/30/2017   Procedure: DILATATION & CURETTAGE/HYSTEROSCOPY WITH MYOSURE;  Surgeon: Schermerhorn, Gwen Her, MD;  Location: [facility] ORS;  Service: Gynecology;  Laterality: N/A;  . EYE SURGERY Bilateral    Cataract Extraction with IOL  . SKIN LESION EXCISION    . TONSILLECTOMY AND ADENOIDECTOMY     Age 78    Home Medications:  Allergies as of 02/24/2019      Reactions   Ace Inhibitors    Angioedema   Pravastatin Other (See Comments)   Other reaction(s): Muscle Pain   Simvastatin Other (See Comments)   stiffness      Medication List       Accurate as of February 24, 2019  3:32 PM. If you have any questions, ask your nurse or doctor.        STOP taking these medications   cyclobenzaprine 5 MG tablet Commonly known as: FLEXERIL Stopped by: Reece Packer, MD     TAKE these medications   aspirin EC 81 MG tablet Take 81 mg by mouth daily.   EPINEPHrine 0.3 mg/0.3 mL Soaj injection Commonly known as: EpiPen 2-Pak Inject 0.3 mLs (0.3 mg total) into the muscle as needed for anaphylaxis.   fluticasone 50 MCG/ACT nasal spray Commonly known as: FLONASE Place 2 sprays into both nostrils daily.   gabapentin 100 MG capsule Commonly known as: Neurontin Take 1 capsule (100 mg total)  by mouth at bedtime.   hydrochlorothiazide 25 MG tablet Commonly known as: HYDRODIURIL Take 1 tablet (25 mg total) by mouth daily.   meloxicam 15 MG tablet Commonly known as: MOBIC Take by mouth.   omeprazole 20 MG capsule Commonly known as: PRILOSEC Take 1 capsule (20 mg total) by mouth daily.   predniSONE 20 MG tablet Commonly known as: Deltasone Take 2 tablets (40 mg total) by mouth daily.   progesterone 100 MG capsule Commonly known as: PROMETRIUM Take 100 mg by mouth daily.   rosuvastatin 40 MG tablet Commonly known as: CRESTOR TAKE 1 TABLET BY MOUTH DAILY   venlafaxine XR 150 MG 24 hr capsule Commonly known as: EFFEXOR-XR TAKE 1 CAPSULE BY MOUTH ONCE DAILY        Allergies:  Allergies  Allergen Reactions  . Ace Inhibitors     Angioedema  . Pravastatin Other (See Comments)    Other reaction(s): Muscle Pain  . Simvastatin Other (See Comments)    stiffness    Family History: Family History  Problem Relation Age of Onset  . Rheum arthritis Mother   . Ovarian cancer Sister   . COPD Sister   . CAD Sister   . Heart disease Father   . Aneurysm Father   . Cancer Brother   . Thrombosis Maternal Grandmother        a. DVT  . Thrombosis Maternal Grandfather        a. DVT  . Thrombosis Paternal Grandmother        a. DVT  . Thrombosis Paternal Grandfather        a. DVT    Social History:  reports that she quit smoking about 42 years ago. Her smoking use included cigarettes. She has a 7.50 pack-year smoking history. She has never used smokeless tobacco. She reports current alcohol use. She reports that she does not use drugs.  ROS: UROLOGY Frequent Urination?: Yes Hard to postpone urination?: No Burning/pain with urination?: No Get up at night to urinate?: Yes Leakage of urine?: No Urine stream starts and stops?: No Trouble starting stream?: Yes Do you have to strain to urinate?: Yes Blood in urine?: No Urinary tract infection?: Yes Sexually transmitted disease?: No Injury to kidneys or bladder?: No Painful intercourse?: No Weak stream?: No Currently pregnant?: No Vaginal bleeding?: No Last menstrual period?: n  Gastrointestinal Nausea?: No Vomiting?: No Indigestion/heartburn?: No Diarrhea?: No Constipation?: No  Constitutional Fever: No Night sweats?: No Weight loss?: No Fatigue?: No  Skin Skin rash/lesions?: No Itching?: No  Eyes Blurred vision?: No Double vision?: No  Ears/Nose/Throat Sore throat?: No Sinus problems?: No  Hematologic/Lymphatic Swollen glands?: No Easy bruising?: No  Cardiovascular Leg swelling?: No Chest pain?: No  Respiratory Cough?: No Shortness of breath?: No  Endocrine  Excessive thirst?: No  Musculoskeletal Back pain?: No  Neurological Headaches?: No Dizziness?: No  Psychologic Depression?: No Anxiety?: No  Physical Exam: BP 130/82   Pulse (!) 106   Ht 4\' 11"  (1.499 m)   Wt 151 lb (68.5 kg)   BMI 30.50 kg/m   Constitutional:  Alert and oriented, No acute distress. HEENT: [place] AT, moist mucus membranes.  Trachea midline, no masses. Cardiovascular: No clubbing, cyanosis, or edema. Respiratory: Normal respiratory effort, no increased work of breathing. GI: Abdomen is soft, nontender, nondistended, no abdominal masses GU: No CVA tenderness.  Patient had mild grade 2 hypermobility of the bladder neck.  No prolapse or stress incontinence Skin: No rashes, bruises or suspicious lesions. Lymph: No  cervical or inguinal adenopathy. Neurologic: Grossly intact, no focal deficits, moving all 4 extremities. Psychiatric: Normal mood and affect.  Laboratory Data: Lab Results  Component Value Date   WBC 7.8 09/02/2018   HGB 14.4 09/02/2018   HCT 42.9 09/02/2018   MCV 90.4 09/02/2018   PLT 333.0 09/02/2018    Lab Results  Component Value Date   CREATININE 0.74 09/02/2018    No results found for: PSA  No results found for: TESTOSTERONE  Lab Results  Component Value Date   HGBA1C 6.0 10/08/2017    Urinalysis No results found for: COLORURINE, APPEARANCEUR, LABSPEC, PHURINE, GLUCOSEU, HGBUR, BILIRUBINUR, KETONESUR, PROTEINUR, UROBILINOGEN, NITRITE, LEUKOCYTESUR  Pertinent Imaging:   Assessment & Plan: Patient has mild mixed incontinence.  The role of urodynamics and treatment and treatment goals discussed.  She has mild frequency and nocturia.  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  She would like to have the urodynamics.  She said she could always wear a pad when she goes out in public working but she would like to try do avoid this.  I think she has high goals but there good once.  If she was going to  have an injectable I would want to perform cystoscopy prior    There are no diagnoses linked to this encounter.  No follow-ups on file.  Martina SinnerScott A , MD  [facility] [address], [address] [place], [place] [address] [phone]

## 2019-02-26 ENCOUNTER — Other Ambulatory Visit: Payer: Self-pay

## 2019-02-26 DIAGNOSIS — N3946 Mixed incontinence: Secondary | ICD-10-CM

## 2019-02-26 LAB — URINE CULTURE

## 2019-02-27 ENCOUNTER — Telehealth: Payer: Self-pay | Admitting: Family Medicine

## 2019-02-27 MED ORDER — CEPHALEXIN 500 MG PO CAPS: 500.0000 mg | ORAL_CAPSULE | Freq: Three times a day (TID) | ORAL | 0 refills | Status: DC

## 2019-02-27 NOTE — Telephone Encounter (Signed)
-----   Message from Bjorn Loser, MD sent at 02/27/2019  6:42 AM EDT -----  Keflex 500 mg 3 times a day for 7 days     ----- Message ----- From: Kyra Manges, CMA Sent: 02/26/2019   8:20 AM EDT To: Bjorn Loser, MD   ----- Message ----- From: Interface, [facility] Lab Results In Sent: 02/24/2019   4:36 PM EDT To: Rowe Robert Clinical

## 2019-03-07 ENCOUNTER — Other Ambulatory Visit: Payer: Self-pay

## 2019-03-07 DIAGNOSIS — Z79899 Other long term (current) drug therapy: Secondary | ICD-10-CM | POA: Insufficient documentation

## 2019-03-07 DIAGNOSIS — T783XXA Angioneurotic edema, initial encounter: Secondary | ICD-10-CM | POA: Diagnosis not present

## 2019-03-07 DIAGNOSIS — Z87891 Personal history of nicotine dependence: Secondary | ICD-10-CM | POA: Insufficient documentation

## 2019-03-07 DIAGNOSIS — I10 Essential (primary) hypertension: Secondary | ICD-10-CM | POA: Diagnosis not present

## 2019-03-07 DIAGNOSIS — Z7982 Long term (current) use of aspirin: Secondary | ICD-10-CM | POA: Insufficient documentation

## 2019-03-07 DIAGNOSIS — M7989 Other specified soft tissue disorders: Secondary | ICD-10-CM | POA: Insufficient documentation

## 2019-03-07 NOTE — ED Triage Notes (Signed)
Patient states that she started having facial swelling about an hour ago. Patient with swelling to left lower lip. Patient states that she took a total of 75 mg of benadryl. Patient states that she has not noticed any improvement. Patient states that she feels like her throat is closing up. Patient states that she was here recently for the same.

## 2019-03-08 ENCOUNTER — Emergency Department
Admission: EM | Admit: 2019-03-08 | Discharge: 2019-03-08 | Disposition: A | Discharge status: Home / Self Care | Payer: Medicare HMO | Attending: Emergency Medicine | Admitting: Emergency Medicine

## 2019-03-08 ENCOUNTER — Other Ambulatory Visit: Payer: Self-pay

## 2019-03-08 ENCOUNTER — Encounter: Payer: Self-pay | Admitting: Emergency Medicine

## 2019-03-08 DIAGNOSIS — T783XXA Angioneurotic edema, initial encounter: Secondary | ICD-10-CM

## 2019-03-08 MED ORDER — FAMOTIDINE 20 MG PO TABS: 20.0000 mg | ORAL_TABLET | Freq: Two times a day (BID) | ORAL | 0 refills | Status: DC

## 2019-03-08 MED ORDER — METHYLPREDNISOLONE SODIUM SUCC 125 MG IJ SOLR
125.0000 mg | Freq: Once | INTRAMUSCULAR | Status: AC
  Administered 2019-03-08: 125 mg via INTRAVENOUS
  Filled 2019-03-08: qty 2

## 2019-03-08 MED ORDER — FAMOTIDINE IN NACL 20-0.9 MG/50ML-% IV SOLN
20.0000 mg | Freq: Once | INTRAVENOUS | Status: AC
  Administered 2019-03-08: 20 mg via INTRAVENOUS
  Filled 2019-03-08: qty 50

## 2019-03-08 MED ORDER — DIPHENHYDRAMINE HCL 50 MG/ML IJ SOLN
25.0000 mg | Freq: Once | INTRAMUSCULAR | Status: AC
  Administered 2019-03-08: 25 mg via INTRAVENOUS
  Filled 2019-03-08: qty 1

## 2019-03-08 MED ORDER — PREDNISONE 20 MG PO TABS: 60.0000 mg | ORAL_TABLET | Freq: Every day | ORAL | 0 refills | Status: DC

## 2019-03-08 MED ORDER — EPINEPHRINE 0.3 MG/0.3ML IJ SOAJ
INTRAMUSCULAR | Status: AC
  Filled 2019-03-08: qty 0.3

## 2019-03-08 NOTE — ED Notes (Signed)
Swelling in face noted to be decreased

## 2019-03-08 NOTE — ED Provider Notes (Addendum)
[facility] Emergency Department Provider Note  ____________________________________________   I have reviewed the triage vital signs and the nursing notes. Where available I have reviewed prior notes and, if possible and indicated, outside hospital notes.   Patient seen and evaluated during the coronavirus epidemic during a time with low staffing  Patient seen for the symptoms described in the history of present illness. She was evaluated in the context of the global COVID-19 pandemic, which necessitated consideration that the patient might be at risk for infection with the SARS-CoV-2 virus that causes COVID-19. Institutional protocols and algorithms that pertain to the evaluation of patients at risk for COVID-19 are in a state of rapid change based on information released by regulatory bodies including the CDC and federal and state organizations. These policies and algorithms were followed during the patient's care in the ED.    HISTORY  Chief Complaint Allergic Reaction    HPI Ashley [name] is a 78 y.o. female  With a history of angioedema of the tongue, recurrent, no known family history of similar, often happened after she took Congohinese food, not on any ARB or ACE, states she had swelling to her left lower lip began this evening.  She took 25 of Benadryl and came in.  She has not had any tongue swelling or difficulty swallowing or difficulty speaking no recent Congohinese food, no change in her medications, no change in her diet, again at rest, no other alleviating or aggravating factors, no sensation of throat swelling.     Past Medical History:  Diagnosis Date  . Abnormal uterine bleeding (AUB)    a. benign endometrial polyps on D+C in 03/2017  . Anxiety   . Arthritis   . Chicken pox   . Depression   . GERD (gastroesophageal reflux disease)   . History of stress test    a. 08/2017 Lexiscan MV: EF 55-65%. No ischemia/infarct. Low risk.  Marland Kitchen. Hyperlipidemia    . Hypertension   . Lacunar infarction (HCC)    a. Dx in setting of dizziness/vertigo; b. 02/2018 Carotid U/S: bilateral intimal thickening and atherosclerotic plaque. No hemodynamically significant stenoses; c. 03/2018 MRI Brain: No mass or abnl enhancement. Chronic microvascular ischemic changes and mod volume loss of the brain. 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  . Vertigo     Patient Active Problem List   Diagnosis Date Noted  . Left knee pain 09/20/2018  . Hypertriglyceridemia 08/28/2018  . Partial tear of left rotator cuff 08/28/2018  . Plantar fasciitis of right foot 05/17/2018  . De Quervain's tenosynovitis, right 05/17/2018  . Bilateral knee pain 04/23/2018  . Left arm pain 03/18/2018  . Bilateral leg edema 10/11/2017  . Prediabetes 10/11/2017  . Chronic low back pain 10/11/2017  . Vertigo 10/11/2017  . Post-menopausal bleeding 07/09/2017  . Neck pain 07/09/2017  . Atypical chest pain 04/24/2017  . Jaw pain 04/24/2017  . Family history of ovarian cancer 01/16/2017  . Essential hypertension 01/16/2017  . GERD (gastroesophageal reflux disease) 01/16/2017  . Depression 12/28/2014  . Hyperlipidemia 12/28/2014    Past Surgical History:  Procedure Laterality Date  . DILATATION & CURETTAGE/HYSTEROSCOPY WITH MYOSURE N/A 03/30/2017   Procedure: DILATATION & CURETTAGE/HYSTEROSCOPY WITH MYOSURE;  Surgeon: Schermerhorn, Ihor Austinhomas J, MD;  Location: [facility] ORS;  Service: Gynecology;  Laterality: N/A;  . EYE SURGERY Bilateral    Cataract Extraction with IOL  . SKIN LESION EXCISION    . TONSILLECTOMY AND ADENOIDECTOMY  Age 24    Prior to Admission medications   Medication Sig Start Date End Date Taking? Authorizing Provider  aspirin EC 81 MG tablet Take 81 mg by mouth daily.    [provider]  cephALEXin (KEFLEX) 500 MG capsule Take 1 capsule (500 mg total) by mouth 3 (three) times daily. 02/27/19   MacDiarmid, Nicki Reaper, MD  EPINEPHrine (EPIPEN 2-PAK)  0.3 mg/0.3 mL IJ SOAJ injection Inject 0.3 mLs (0.3 mg total) into the muscle as needed for anaphylaxis. 02/20/19   Harvest Dark, MD  fluticasone (FLONASE) 50 MCG/ACT nasal spray Place 2 sprays into both nostrils daily. 03/26/18   Crecencio Mc, MD  gabapentin (NEURONTIN) 100 MG capsule Take 1 capsule (100 mg total) by mouth at bedtime. Patient taking differently: Take 100 mg by mouth at bedtime.  01/24/18   McLean-Scocuzza, Nino Glow, MD  hydrochlorothiazide (HYDRODIURIL) 25 MG tablet Take 1 tablet (25 mg total) by mouth daily. 04/15/16   Harvest Dark, MD  meloxicam (MOBIC) 15 MG tablet Take by mouth. 03/09/18   [provider]  omeprazole (PRILOSEC) 20 MG capsule Take 1 capsule (20 mg total) by mouth daily. 11/21/18   Leone Haven, MD  predniSONE (DELTASONE) 20 MG tablet Take 2 tablets (40 mg total) by mouth daily. 02/20/19   Harvest Dark, MD  progesterone (PROMETRIUM) 100 MG capsule Take 100 mg by mouth daily.    [provider]  rosuvastatin (CRESTOR) 40 MG tablet TAKE 1 TABLET BY MOUTH DAILY 11/08/18   Leone Haven, MD  venlafaxine XR (EFFEXOR-XR) 150 MG 24 hr capsule TAKE 1 CAPSULE BY MOUTH ONCE DAILY 10/02/18   Leone Haven, MD    Allergies Ace inhibitors, Pravastatin, and Simvastatin  Family History  Problem Relation Age of Onset  . Rheum arthritis Mother   . Ovarian cancer Sister   . COPD Sister   . CAD Sister   . Heart disease Father   . Aneurysm Father   . Cancer Brother   . Thrombosis Maternal Grandmother        a. DVT  . Thrombosis Maternal Grandfather        a. DVT  . Thrombosis Paternal Grandmother        a. DVT  . Thrombosis Paternal Grandfather        a. DVT    Social History Social History   Tobacco Use  . Smoking status: Former Smoker    Packs/day: 0.50    Years: 15.00    Pack years: 7.50    Types: Cigarettes    Quit date: 08/07/1976    Years since quitting: 42.6  . Smokeless tobacco: Never Used  Substance Use  Topics  . Alcohol use: Yes    Comment: ocassionally  . Drug use: No    Review of Systems Constitutional: No fever/chills Eyes: No visual changes. ENT: No sore throat. No stiff neck no neck pain Cardiovascular: Denies chest pain. Respiratory: Denies shortness of breath. Gastrointestinal:   no vomiting.  No diarrhea.  No constipation. Genitourinary: Negative for dysuria. Musculoskeletal: Negative lower extremity swelling Skin: Negative for rash. Neurological: Negative for severe headaches, focal weakness or numbness.   ____________________________________________   PHYSICAL EXAM:  VITAL SIGNS: ED Triage Vitals [03/08/19 0001]  Enc Vitals Group     BP (!) 151/81     Pulse Rate 90     Resp 18     Temp 98.5 F (36.9 C)     Temp Source Oral     SpO2  95 %     Weight 150 lb (68 kg)     Height 4\' 11"  (1.499 m)     Head Circumference      Peak Flow      Pain Score 0     Pain Loc      Pain Edu?      Excl. in GC?     Constitutional: Alert and oriented. Well appearing and in no acute distress. Eyes: Conjunctivae are normal Head: Atraumatic HEENT: No congestion/rhinnorhea. Mucous membranes are moist.  Oropharynx non-erythematous, there is mild swelling to the left lower lip and which goes into the left cheek.  There is however no tongue swelling, normal voice, no hoarse voice, no stridor, clear lungs, no rash. Neck:   Nontender with no meningismus, no masses, no stridor Cardiovascular: Normal rate, regular rhythm. Grossly normal heart sounds.  Good peripheral circulation. Respiratory: Normal respiratory effort.  No retractions. Lungs CTAB. Abdominal: Soft and nontender. No distention. No guarding no rebound Skin:  Skin is warm, dry and intact. No rash noted. Psychiatric: Mood and affect are normal. Speech and behavior are normal.  ____________________________________________   LABS (all labs ordered are listed, but only abnormal results are displayed)  Labs Reviewed - No  data to display  Pertinent labs  results that were available during my care of the patient were reviewed by me and considered in my medical decision making (see chart for details). ____________________________________________  EKG  I personally interpreted any EKGs ordered by me or triage  ____________________________________________  RADIOLOGY  Pertinent labs & imaging results that were available during my care of the patient were reviewed by me and considered in my medical decision making (see chart for details). If possible, patient and/or family made aware of any abnormal findings.  No results found. ____________________________________________    PROCEDURES  Procedure(s) performed: None  Procedures  Critical Care performed: None  ____________________________________________   INITIAL IMPRESSION / ASSESSMENT AND PLAN / ED COURSE  Pertinent labs & imaging results that were available during my care of the patient were reviewed by me and considered in my medical decision making (see chart for details).   Patient with history of recurrent angioedema presents today with angioedema of the left lower lip only.  Usually we can get this to resolve in the emergency room.  No evidence of acute airway involvement or angioedema at the time fortunately.  Hopefully will not progress.  She did take 1 Benadryl prior to coming in.  She took 2 Benadryl many hours ago prior to going to bed as well.  This was a routine sleep time medication for her.  We will give her the usual cocktail of medications that may help with this and we will observe her closely in the emergency department.  No indication for epinephrine at this time.   ----------------------------------------- 1:45 AM on 03/08/2019 -----------------------------------------  Swelling is decreased patient has no advancement of symptoms including no oral airway issues no breathing issues etc.  Feels subjectively better we will  continue to observe  ----------------------------------------- 4:17 AM on 03/08/2019 -----------------------------------------  Patient swelling is nearly gone, she declines further observation although she understands that we usually prefer to watch for 6 hours, she is been there for 4-1/2 with no progression of symptoms.  There is no tongue swelling.  No shortness of breath and she is requesting discharge.  I do not think this is unreasonable but she understands that if she is not here we can watch her.  Return  precautions follow-up given understood she already has an EpiPen at home   ____________________________________________   FINAL CLINICAL IMPRESSION(S) / ED DIAGNOSES  Final diagnoses:  None      This chart was dictated using voice recognition software.  Despite best efforts to proofread,  errors can occur which can change meaning.      Jeanmarie PlantMcShane, James A, MD 03/08/19 [id number]    Jeanmarie PlantMcShane, James A, MD 03/08/19 [id number]    Jeanmarie PlantMcShane, James A, MD 03/08/19 [phone]

## 2019-03-08 NOTE — ED Notes (Signed)
Swelling decreased from arrival, but swelling still noted. Pt's bed reclined and lights turned off.  VSS

## 2019-03-08 NOTE — ED Notes (Signed)
Peripheral IV discontinued. Catheter intact. No signs of infiltration or redness. Gauze applied to IV site.    Marland KitchenDischarge instructions reviewed with patient. Questions fielded by this RN. Patient verbalizes understanding of instructions. Patient discharged home in stable condition per Mcshane. No acute distress noted at time of discharge.

## 2019-03-08 NOTE — Discharge Instructions (Signed)
If the swelling gets worse take Benadryl, and come back here.  If you have any other concerns especially swelling to your tongue or throat, difficulty breathing, or anything that makes you feel that you are facing a significant allergic reaction that could impair your ability to breathe, please use the EpiPen and call 911.

## 2019-03-10 ENCOUNTER — Ambulatory Visit: Payer: Self-pay | Admitting: Urology

## 2019-03-10 DIAGNOSIS — R3915 Urgency of urination: Secondary | ICD-10-CM | POA: Diagnosis not present

## 2019-03-10 DIAGNOSIS — N3941 Urge incontinence: Secondary | ICD-10-CM | POA: Diagnosis not present

## 2019-03-10 DIAGNOSIS — R350 Frequency of micturition: Secondary | ICD-10-CM | POA: Diagnosis not present

## 2019-03-11 ENCOUNTER — Other Ambulatory Visit: Payer: Self-pay | Admitting: Urology

## 2019-03-14 ENCOUNTER — Ambulatory Visit (INDEPENDENT_AMBULATORY_CARE_PROVIDER_SITE_OTHER): Payer: Medicare HMO | Admitting: Family Medicine

## 2019-03-14 ENCOUNTER — Telehealth: Payer: Self-pay | Admitting: Urology

## 2019-03-14 ENCOUNTER — Other Ambulatory Visit: Payer: Self-pay

## 2019-03-14 ENCOUNTER — Encounter: Payer: Self-pay | Admitting: Family Medicine

## 2019-03-14 DIAGNOSIS — T7840XA Allergy, unspecified, initial encounter: Secondary | ICD-10-CM | POA: Diagnosis not present

## 2019-03-14 MED ORDER — HYDROCHLOROTHIAZIDE 25 MG PO TABS: 25.0000 mg | ORAL_TABLET | Freq: Every day | ORAL | 1 refills | Status: DC

## 2019-03-14 NOTE — Progress Notes (Signed)
Virtual Visit via telephone Note  This visit type was conducted due to national recommendations for restrictions regarding the COVID-19 pandemic (e.g. social distancing).  This format is felt to be most appropriate for this patient at this time.  All issues noted in this document were discussed and addressed.  No physical exam was performed (except for noted visual exam findings with Video Visits).   I connected with Ashley NickelsPatricia [name] today at  3:15 PM EDT by telephone and verified that I am speaking with the correct person using two identifiers. Location patient: home Location provider: work Persons participating in the virtual visit: patient, provider, husband  I discussed the limitations, risks, security and privacy concerns of performing an evaluation and management service by telephone and the availability of in person appointments. I also discussed with the patient that there may be a patient responsible charge related to this service. The patient expressed understanding and agreed to proceed.  Interactive audio and video telecommunications were attempted between this provider and patient, however failed, due to patient having technical difficulties OR patient did not have access to video capability.  We continued and completed visit with audio only.   Reason for visit: follow-up  HPI: Allergic reaction: Patient has been seen in the ED on 2 occasions in the past month for an allergic reaction.  1 of which her tongue and face swelled up.  The second time her lower lip and face had swelling as well.  She was treated with prednisone and famotidine most recently.  She has completed those medications.  She is taking Benadryl once nightly as a preventative.  She has an appointment with an allergist next week.  She has had no recurrence of symptoms.  She noted she was using CBD oil previously though she discontinued that.  She is keeping a food diary.  She has a history of this occurring in the past  and was evaluated by allergy and immunology at that time about 5 years ago and nothing was found as a cause.  She does have an EpiPen.   ROS: See pertinent positives and negatives per HPI.  Past Medical History:  Diagnosis Date  . Abnormal uterine bleeding (AUB)    a. benign endometrial polyps on D+C in 03/2017  . Anxiety   . Arthritis   . Chicken pox   . Depression   . GERD (gastroesophageal reflux disease)   . History of stress test    a. 08/2017 Lexiscan MV: EF 55-65%. No ischemia/infarct. Low risk.  Marland Kitchen. Hyperlipidemia   . Hypertension   . Lacunar infarction (HCC)    a. Dx in setting of dizziness/vertigo; b. 02/2018 Carotid U/S: bilateral intimal thickening and atherosclerotic plaque. No hemodynamically significant stenoses; c. 03/2018 MRI Brain: No mass or abnl enhancement. Chronic microvascular ischemic changes and mod volume loss of the brain. 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  . Vertigo     Past Surgical History:  Procedure Laterality Date  . DILATATION & CURETTAGE/HYSTEROSCOPY WITH MYOSURE N/A 03/30/2017   Procedure: DILATATION & CURETTAGE/HYSTEROSCOPY WITH MYOSURE;  Surgeon: Schermerhorn, Ihor Austinhomas J, MD;  Location: [facility] ORS;  Service: Gynecology;  Laterality: N/A;  . EYE SURGERY Bilateral    Cataract Extraction with IOL  . SKIN LESION EXCISION    . TONSILLECTOMY AND ADENOIDECTOMY     Age 78    Family History  Problem Relation Age of Onset  . Rheum arthritis Mother   . Ovarian cancer Sister   . COPD  Sister   . CAD Sister   . Heart disease Father   . Aneurysm Father   . Cancer Brother   . Thrombosis Maternal Grandmother        a. DVT  . Thrombosis Maternal Grandfather        a. DVT  . Thrombosis Paternal Grandmother        a. DVT  . Thrombosis Paternal Grandfather        a. DVT    SOCIAL HX: Former smoker.   Current Outpatient Medications:  .  aspirin EC 81 MG tablet, Take 81 mg by mouth daily., Disp: , Rfl:  .  cephALEXin (KEFLEX) 500  MG capsule, Take 1 capsule (500 mg total) by mouth 3 (three) times daily., Disp: 21 capsule, Rfl: 0 .  EPINEPHrine (EPIPEN 2-PAK) 0.3 mg/0.3 mL IJ SOAJ injection, Inject 0.3 mLs (0.3 mg total) into the muscle as needed for anaphylaxis., Disp: 1 each, Rfl: 1 .  fluticasone (FLONASE) 50 MCG/ACT nasal spray, Place 2 sprays into both nostrils daily., Disp: 16 g, Rfl: 0 .  gabapentin (NEURONTIN) 100 MG capsule, Take 1 capsule (100 mg total) by mouth at bedtime. (Patient taking differently: Take 100 mg by mouth at bedtime. ), Disp: 90 capsule, Rfl: 1 .  hydrochlorothiazide (HYDRODIURIL) 25 MG tablet, Take 1 tablet (25 mg total) by mouth daily., Disp: 90 tablet, Rfl: 1 .  meloxicam (MOBIC) 15 MG tablet, Take by mouth., Disp: , Rfl:  .  omeprazole (PRILOSEC) 20 MG capsule, Take 1 capsule (20 mg total) by mouth daily., Disp: 90 capsule, Rfl: 1 .  progesterone (PROMETRIUM) 100 MG capsule, Take 100 mg by mouth daily., Disp: , Rfl:  .  rosuvastatin (CRESTOR) 40 MG tablet, TAKE 1 TABLET BY MOUTH DAILY, Disp: 90 tablet, Rfl: 1 .  venlafaxine XR (EFFEXOR-XR) 150 MG 24 hr capsule, TAKE 1 CAPSULE BY MOUTH ONCE DAILY, Disp: 90 capsule, Rfl: 2  EXAM: This was a telehealth telephone visit and thus no physical exam was completed.  ASSESSMENT AND PLAN:  Discussed the following assessment and plan:  Allergic reaction Undetermined cause.  Discussed continuing Benadryl nightly.  Advised on reasons to use her EpiPen.  She will keep her appointment with the allergist.  Given return precautions.    I discussed the assessment and treatment plan with the patient. The patient was provided an opportunity to ask questions and all were answered. The patient agreed with the plan and demonstrated an understanding of the instructions.   The patient was advised to call back or seek an in-person evaluation if the symptoms worsen or if the condition fails to improve as anticipated.  I provided 13 minutes of non-face-to-face time  during this encounter.   Tommi Rumps, MD

## 2019-03-14 NOTE — Telephone Encounter (Signed)
Spoke with patient and she states she is having a throbbing "painful" sensation with urination not burning. Patient was asked to drop off a urine for recheck, she cannot do this today A appointment for Monday was made with PA for further evaluation

## 2019-03-14 NOTE — Telephone Encounter (Signed)
Pt LMOM and wants to know if we can send in a refill of abx.  She doesn't feel like it has cleared up.

## 2019-03-14 NOTE — Assessment & Plan Note (Signed)
Undetermined cause.  Discussed continuing Benadryl nightly.  Advised on reasons to use her EpiPen.  She will keep her appointment with the allergist.  Given return precautions.

## 2019-03-17 ENCOUNTER — Ambulatory Visit (INDEPENDENT_AMBULATORY_CARE_PROVIDER_SITE_OTHER): Payer: Medicare HMO | Admitting: Physician Assistant

## 2019-03-17 ENCOUNTER — Other Ambulatory Visit: Payer: Self-pay

## 2019-03-17 ENCOUNTER — Encounter: Payer: Self-pay | Admitting: Physician Assistant

## 2019-03-17 VITALS — BP 127/80 | HR 106 | Ht 59.0 in | Wt 152.4 lb

## 2019-03-17 DIAGNOSIS — N390 Urinary tract infection, site not specified: Secondary | ICD-10-CM | POA: Diagnosis not present

## 2019-03-17 DIAGNOSIS — R3915 Urgency of urination: Secondary | ICD-10-CM

## 2019-03-17 DIAGNOSIS — R350 Frequency of micturition: Secondary | ICD-10-CM | POA: Diagnosis not present

## 2019-03-17 DIAGNOSIS — R300 Dysuria: Secondary | ICD-10-CM

## 2019-03-17 LAB — URINALYSIS, COMPLETE
Bilirubin, UA: NEGATIVE
Glucose, UA: NEGATIVE
Nitrite, UA: NEGATIVE
Specific Gravity, UA: 1.025 (ref 1.005–1.030)
Urobilinogen, Ur: 0.2 mg/dL (ref 0.2–1.0)
pH, UA: 5.5 (ref 5.0–7.5)

## 2019-03-17 LAB — MICROSCOPIC EXAMINATION
Bacteria, UA: NONE SEEN
RBC, Urine: NONE SEEN /hpf (ref 0–2)

## 2019-03-17 NOTE — Progress Notes (Signed)
03/17/2019 11:50 AM   Ashley RifflePatricia A [name] 12-19-1940 [id number]  CC: Dysuria, frequency, urgency, urethral throbbing  HPI: Ashley Riffleatricia A [name] is a 78 y.o. female who presents today for evaluation of possible UTI. She is an established [facility] patient who last saw Dr. Sherron MondayMacDiarmid on 02/24/2019 for urinary incontinence.  She also had complaints of dysuria, urinary urgency, and frequency at that time.  Urine culture was positive for 10-25K CFUs/mL beta-hemolytic Strep.  She was treated with Keflex 500 mg 3 times daily x7 days.  She completed this course of antibiotics and denies any missed doses.  Patient reports that her irritative voiding symptoms improved somewhat with completion of her prescribed antibiotics but have persisted and possibly worsened since.  They are still less severe than they were at the time that she saw Dr. Sherron MondayMacDiarmid.  Additionally, she describes a sensation of vaginal pressure when sitting and standing that started approximately 6 weeks ago.  This sensation has continued.  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  She subsequently underwent urodynamics and has a follow-up scheduled with Dr. Sherron MondayMacDiarmid on 03/31/2019.  She denies fevers, chills, nausea, vomiting, flank pain, gross dysuria, vaginal itching, and vaginal discharge.  She does not have a history of recurrent UTI.  In-office UA today positive for 1+ blood, 1+ protein, and trace leukocyte esterase; urine microscopy normal.  PMH: Past Medical History:  Diagnosis Date  . Abnormal uterine bleeding (AUB)    a. benign endometrial polyps on D+C in 03/2017  . Anxiety   . Arthritis   . Chicken pox   . Depression   . GERD (gastroesophageal reflux disease)   . History of stress test    a. 08/2017 Lexiscan MV: EF 55-65%. No ischemia/infarct. Low risk.  Marland Kitchen. Hyperlipidemia   . Hypertension   . Lacunar infarction (HCC)    a. Dx in  setting of dizziness/vertigo; b. 02/2018 Carotid U/S: bilateral intimal thickening and atherosclerotic plaque. No hemodynamically significant stenoses; c. 03/2018 MRI Brain: No mass or abnl enhancement. Chronic microvascular ischemic changes and mod volume loss of the brain. 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  . Vertigo     Surgical History: Past Surgical History:  Procedure Laterality Date  . DILATATION & CURETTAGE/HYSTEROSCOPY WITH MYOSURE N/A 03/30/2017   Procedure: DILATATION & CURETTAGE/HYSTEROSCOPY WITH MYOSURE;  Surgeon: Schermerhorn, Ihor Austinhomas J, MD;  Location: [facility] ORS;  Service: Gynecology;  Laterality: N/A;  . EYE SURGERY Bilateral    Cataract Extraction with IOL  . SKIN LESION EXCISION    . TONSILLECTOMY AND ADENOIDECTOMY     Age 78    Home Medications:  Allergies as of 03/17/2019      Reactions   Ace Inhibitors    Angioedema   Pravastatin Other (See Comments)   Other reaction(s): Muscle Pain   Simvastatin Other (See Comments)   stiffness      Medication List       Accurate as of March 17, 2019 11:50 AM. If you have any questions, ask your nurse or doctor.        STOP taking these medications   cephALEXin 500 MG capsule Commonly known as: Keflex Stopped by: Carman ChingSamantha Vaillancourt, PA-C     TAKE these medications   aspirin EC 81 MG tablet Take 81 mg by mouth daily.   EPINEPHrine 0.3 mg/0.3 mL Soaj injection Commonly known as: EpiPen 2-Pak Inject 0.3 mLs (0.3 mg total)  into the muscle as needed for anaphylaxis.   fluticasone 50 MCG/ACT nasal spray Commonly known as: FLONASE Place 2 sprays into both nostrils daily.   gabapentin 100 MG capsule Commonly known as: Neurontin Take 1 capsule (100 mg total) by mouth at bedtime.   hydrochlorothiazide 25 MG tablet Commonly known as: HYDRODIURIL Take 1 tablet (25 mg total) by mouth daily.   meloxicam 15 MG tablet Commonly known as: MOBIC Take by mouth.   omeprazole 20 MG capsule  Commonly known as: PRILOSEC Take 1 capsule (20 mg total) by mouth daily.   progesterone 100 MG capsule Commonly known as: PROMETRIUM Take 100 mg by mouth daily.   rosuvastatin 40 MG tablet Commonly known as: CRESTOR TAKE 1 TABLET BY MOUTH DAILY   venlafaxine XR 150 MG 24 hr capsule Commonly known as: EFFEXOR-XR TAKE 1 CAPSULE BY MOUTH ONCE DAILY       Allergies:  Allergies  Allergen Reactions  . Ace Inhibitors     Angioedema  . Pravastatin Other (See Comments)    Other reaction(s): Muscle Pain  . Simvastatin Other (See Comments)    stiffness    Family History: Family History  Problem Relation Age of Onset  . Rheum arthritis Mother   . Ovarian cancer Sister   . COPD Sister   . CAD Sister   . Heart disease Father   . Aneurysm Father   . Cancer Brother   . Thrombosis Maternal Grandmother        a. DVT  . Thrombosis Maternal Grandfather        a. DVT  . Thrombosis Paternal Grandmother        a. DVT  . Thrombosis Paternal Grandfather        a. DVT    Social History:   reports that she quit smoking about 42 years ago. Her smoking use included cigarettes. She has a 7.50 pack-year smoking history. She has never used smokeless tobacco. She reports current alcohol use. She reports that she does not use drugs.  ROS: UROLOGY Frequent Urination?: Yes Hard to postpone urination?: Yes Burning/pain with urination?: Yes Get up at night to urinate?: Yes Leakage of urine?: No Urine stream starts and stops?: No Trouble starting stream?: No Do you have to strain to urinate?: No Blood in urine?: No Urinary tract infection?: Yes Sexually transmitted disease?: No Injury to kidneys or bladder?: No Painful intercourse?: No Weak stream?: No Currently pregnant?: No Vaginal bleeding?: No Last menstrual period?: n  Gastrointestinal Nausea?: No Vomiting?: No Indigestion/heartburn?: No Diarrhea?: No Constipation?: No  Constitutional Fever: No Night sweats?: No  Weight loss?: No Fatigue?: No  Skin Skin rash/lesions?: No Itching?: No  Eyes Blurred vision?: No Double vision?: No  Ears/Nose/Throat Sore throat?: No Sinus problems?: No  Hematologic/Lymphatic Swollen glands?: No Easy bruising?: No  Cardiovascular Leg swelling?: No Chest pain?: No  Respiratory Cough?: No Shortness of breath?: No  Endocrine Excessive thirst?: No  Musculoskeletal Back pain?: No Joint pain?: No  Neurological Headaches?: No Dizziness?: No  Psychologic Depression?: No Anxiety?: No  Physical Exam: BP 127/80 (BP Location: Left Arm, Patient Position: Sitting, Cuff Size: Normal)   Pulse (!) 106   Ht 4\' 11"  (1.499 m)   Wt 152 lb 6.4 oz (69.1 kg)   BMI 30.78 kg/m   Constitutional:  Alert and oriented, no acute distress, nontoxic appearing HEENT: [place], AT Cardiovascular: No clubbing, cyanosis, or edema Respiratory: Normal respiratory effort, no increased work of breathing Skin: No rashes, bruises or suspicious lesions  Neurologic: Grossly intact, no focal deficits, moving all 4 extremities Psychiatric: Normal mood and affect  Laboratory Data: Results for orders placed or performed in visit on 03/17/19  Microscopic Examination   URINE  Result Value Ref Range   WBC, UA 0-5 0 - 5 /hpf   RBC None seen 0 - 2 /hpf   Epithelial Cells (non renal) 0-10 0 - 10 /hpf   Casts Present (A) None seen /lpf   Cast Type Hyaline casts N/A   Bacteria, UA None seen None seen/Few  Urinalysis, Complete  Result Value Ref Range   Specific Gravity, UA 1.025 1.005 - 1.030   pH, UA 5.5 5.0 - 7.5   Color, UA Yellow Yellow   Appearance Ur Cloudy (A) Clear   Leukocytes,UA Trace (A) Negative   Protein,UA 1+ (A) Negative/Trace   Glucose, UA Negative Negative   Ketones, UA Trace (A) Negative   RBC, UA 1+ (A) Negative   Bilirubin, UA Negative Negative   Urobilinogen, Ur 0.2 0.2 - 1.0 mg/dL   Nitrite, UA Negative Negative   Microscopic Examination See below:     Assessment & Plan:   1. Dysuria Patient reports some improvement in irritative voiding symptoms with appropriate antibiotic therapy.  UA nonspecific today.  I suspect her current complaints may be due to postinfectious cystitis.  I counseled the patient that irritative voiding symptoms may take several weeks to resolve completely following treatment of urinary tract infection.  I discussed the role of antibiotic stewardship in this setting.  I will defer antibiotics today pending urine culture.  Patient is in agreement with this plan. -Urinalysis, Complete -Urine culture -Follow-up with Dr. Matilde Sprang for UDS results at scheduled 03/21/2019 appointment  Debroah Loop, PA-C  [facility] [address] [place], [place] [address] [phone]

## 2019-03-18 DIAGNOSIS — J3089 Other allergic rhinitis: Secondary | ICD-10-CM | POA: Diagnosis not present

## 2019-03-18 DIAGNOSIS — J3081 Allergic rhinitis due to animal (cat) (dog) hair and dander: Secondary | ICD-10-CM | POA: Diagnosis not present

## 2019-03-18 DIAGNOSIS — H1045 Other chronic allergic conjunctivitis: Secondary | ICD-10-CM | POA: Diagnosis not present

## 2019-03-18 DIAGNOSIS — T783XXD Angioneurotic edema, subsequent encounter: Secondary | ICD-10-CM | POA: Diagnosis not present

## 2019-03-19 DIAGNOSIS — T783XXA Angioneurotic edema, initial encounter: Secondary | ICD-10-CM | POA: Diagnosis not present

## 2019-03-20 ENCOUNTER — Telehealth: Payer: Self-pay | Admitting: Physician Assistant

## 2019-03-20 NOTE — Telephone Encounter (Signed)
Pt called and would like a call back with her lab results.

## 2019-03-21 NOTE — Telephone Encounter (Signed)
Patient states she is still having urinary symptoms and was questioning the urine culture results. I spoke to Sam and a urine culture was not sent on 8/10 because she has underlying bladder issues and a UDS was to be done today. Patient states she did not do the study. I informed her that she is welcome to come in to do a urine culture on Monday. Patient stated she will if she is still having symptoms.

## 2019-03-31 ENCOUNTER — Ambulatory Visit: Payer: Medicare HMO | Admitting: Urology

## 2019-03-31 ENCOUNTER — Ambulatory Visit: Payer: Self-pay | Admitting: Urology

## 2019-04-04 DIAGNOSIS — N393 Stress incontinence (female) (male): Secondary | ICD-10-CM | POA: Diagnosis not present

## 2019-04-04 DIAGNOSIS — N390 Urinary tract infection, site not specified: Secondary | ICD-10-CM | POA: Diagnosis not present

## 2019-04-04 DIAGNOSIS — R350 Frequency of micturition: Secondary | ICD-10-CM | POA: Diagnosis not present

## 2019-04-04 DIAGNOSIS — E6609 Other obesity due to excess calories: Secondary | ICD-10-CM | POA: Diagnosis not present

## 2019-04-04 DIAGNOSIS — R351 Nocturia: Secondary | ICD-10-CM | POA: Diagnosis not present

## 2019-04-15 DIAGNOSIS — N393 Stress incontinence (female) (male): Secondary | ICD-10-CM | POA: Diagnosis not present

## 2019-04-15 DIAGNOSIS — N3281 Overactive bladder: Secondary | ICD-10-CM | POA: Diagnosis not present

## 2019-04-16 DIAGNOSIS — R69 Illness, unspecified: Secondary | ICD-10-CM | POA: Diagnosis not present

## 2019-04-22 DIAGNOSIS — M5416 Radiculopathy, lumbar region: Secondary | ICD-10-CM | POA: Diagnosis not present

## 2019-04-22 DIAGNOSIS — M5136 Other intervertebral disc degeneration, lumbar region: Secondary | ICD-10-CM | POA: Diagnosis not present

## 2019-04-22 DIAGNOSIS — M48062 Spinal stenosis, lumbar region with neurogenic claudication: Secondary | ICD-10-CM | POA: Diagnosis not present

## 2019-04-25 ENCOUNTER — Ambulatory Visit (INDEPENDENT_AMBULATORY_CARE_PROVIDER_SITE_OTHER): Payer: Medicare HMO

## 2019-04-25 ENCOUNTER — Other Ambulatory Visit: Payer: Self-pay

## 2019-04-25 ENCOUNTER — Ambulatory Visit: Payer: Medicare HMO | Admitting: Family Medicine

## 2019-04-25 DIAGNOSIS — Z0000 Encounter for general adult medical examination without abnormal findings: Secondary | ICD-10-CM

## 2019-04-25 NOTE — Patient Instructions (Addendum)
  Ashley [name] , Thank you for taking time to come for your Medicare Wellness Visit. I appreciate your ongoing commitment to your health goals. Please review the following plan we discussed and let me know if I can assist you in the future.   These are the goals we discussed: Goals    . DIET - INCREASE LEAN PROTEINS     Low carb diet       This is a list of the screening recommended for you and due dates:  Health Maintenance  Topic Date Due  . Colon Cancer Screening  10/06/2019  . Tetanus Vaccine  08/07/2021  . Flu Shot  Completed  . DEXA scan (bone density measurement)  Completed  . Pneumonia vaccines  Completed

## 2019-04-25 NOTE — Progress Notes (Signed)
Subjective:   Ashley [name] is a 78 y.o. female who presents for Medicare Annual (Subsequent) preventive examination.  Review of Systems:  No ROS.  Medicare Wellness Virtual Visit.  Visual/audio telehealth visit, UTA vital signs.   See social history for additional risk factors.   Cardiac Risk Factors include: advanced age (>9men, >22 women);hypertension     Objective:     Vitals: There were no vitals taken for this visit.  There is no height or weight on file to calculate BMI.  Advanced Directives 04/25/2019 03/08/2019 02/20/2019 04/19/2018 11/17/2017 03/27/2017 01/03/2017  Does Patient Have a Medical Advance Directive? Yes Yes Yes Yes Yes Yes Yes  Type of Paramedic of [place];Living will Healthcare Power of [place];Living will [place];Living will [place];Living will [place];Living will [place];Living will  Does patient want to make changes to medical advance directive? No - Patient declined - No - Patient declined No - Patient declined - No - Patient declined No - Patient declined  Copy of [place] in Chart? No - copy requested - No - copy requested No - copy requested - - No - copy requested    Tobacco Social History   Tobacco Use  Smoking Status Former Smoker  . Packs/day: 0.50  . Years: 15.00  . Pack years: 7.50  . Types: Cigarettes  . Quit date: 08/07/1976  . Years since quitting: 42.7  Smokeless Tobacco Never Used     Counseling given: Not Answered   Clinical Intake:  Pre-visit preparation completed: Yes        Diabetes: No  How often do you need to have someone help you when you read instructions, pamphlets, or other written materials from your doctor or pharmacy?: 1 - Never  Interpreter Needed?: No     Past Medical History:  Diagnosis Date  . Abnormal uterine bleeding (AUB)    a. benign  endometrial polyps on D+C in 03/2017  . Anxiety   . Arthritis   . Chicken pox   . Depression   . GERD (gastroesophageal reflux disease)   . History of stress test    a. 08/2017 Lexiscan MV: EF 55-65%. No ischemia/infarct. Low risk.  Marland Kitchen Hyperlipidemia   . Hypertension   . 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 Dx in setting of dizziness/vertigo; b. 02/2018 Carotid U/S: bilateral intimal thickening and atherosclerotic plaque. No hemodynamically significant stenoses; c. 03/2018 MRI Brain: No mass or abnl enhancement. Chronic microvascular ischemic changes and mod volume loss of the brain. 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  . Vertigo    Past Surgical History:  Procedure Laterality Date  . DILATATION & CURETTAGE/HYSTEROSCOPY WITH MYOSURE N/A 03/30/2017   Procedure: DILATATION & CURETTAGE/HYSTEROSCOPY WITH MYOSURE;  Surgeon: Schermerhorn, Gwen Her, MD;  Location: [facility] ORS;  Service: Gynecology;  Laterality: N/A;  . EYE SURGERY Bilateral    Cataract Extraction with IOL  . SKIN LESION EXCISION    . TONSILLECTOMY AND ADENOIDECTOMY     Age 68   Family History  Problem Relation Age of Onset  . Rheum arthritis Mother   . Ovarian cancer Sister   . COPD Sister   . CAD Sister   . Heart disease Father   . Aneurysm Father   . Cancer Brother   . Thrombosis Maternal Grandmother        a. DVT  . Thrombosis Maternal Grandfather  a. DVT  . Thrombosis Paternal Grandmother        a. DVT  . Thrombosis Paternal Grandfather        a. DVT   Social History   Socioeconomic History  . Marital status: Married    Spouse name: Not on file  . Number of children: Not on file  . Years of education: Not on file  . Highest education level: Not on file  Occupational History  . Not on file  Social Needs  . Financial resource strain: Not hard at all  . Food insecurity    Worry: Never true    Inability: Never true  . Transportation needs    Medical: No    Non-medical: No  Tobacco  Use  . Smoking status: Former Smoker    Packs/day: 0.50    Years: 15.00    Pack years: 7.50    Types: Cigarettes    Quit date: 08/07/1976    Years since quitting: 42.7  . Smokeless tobacco: Never Used  Substance and Sexual Activity  . Alcohol use: Yes    Comment: ocassionally  . Drug use: No  . Sexual activity: Not on file  Lifestyle  . Physical activity    Days per week: 0 days    Minutes per session: Not on file  . Stress: Not at all  Relationships  . Social Musicianconnections    Talks on phone: Not on file    Gets together: Not on file    Attends religious service: Not on file    Active member of club or organization: Not on file    Attends meetings of clubs or organizations: Not on file    Relationship status: Not on file  Other Topics Concern  . Not on file  Social History Narrative   Married     Outpatient Encounter Medications as of 04/25/2019  Medication Sig  . aspirin EC 81 MG tablet Take 81 mg by mouth daily.  Marland Kitchen. gabapentin (NEURONTIN) 100 MG capsule Take 1 capsule (100 mg total) by mouth at bedtime. (Patient taking differently: Take 100 mg by mouth at bedtime. )  . hydrochlorothiazide (HYDRODIURIL) 25 MG tablet Take 1 tablet (25 mg total) by mouth daily.  Marland Kitchen. HYDROcodone-acetaminophen (NORCO/VICODIN) 5-325 MG tablet 1/2-1 po bid prn  . MYRBETRIQ 25 MG TB24 tablet   . omeprazole (PRILOSEC) 20 MG capsule Take 1 capsule (20 mg total) by mouth daily.  . progesterone (PROMETRIUM) 100 MG capsule Take 100 mg by mouth daily.  . rosuvastatin (CRESTOR) 40 MG tablet TAKE 1 TABLET BY MOUTH DAILY  . venlafaxine XR (EFFEXOR-XR) 150 MG 24 hr capsule TAKE 1 CAPSULE BY MOUTH ONCE DAILY  . EPINEPHrine (EPIPEN 2-PAK) 0.3 mg/0.3 mL IJ SOAJ injection Inject 0.3 mLs (0.3 mg total) into the muscle as needed for anaphylaxis. (Patient not taking: Reported on 04/25/2019)  . fluticasone (FLONASE) 50 MCG/ACT nasal spray Place 2 sprays into both nostrils daily. (Patient not taking: Reported on  04/25/2019)  . [DISCONTINUED] meloxicam (MOBIC) 15 MG tablet Take by mouth.   No facility-administered encounter medications on file as of 04/25/2019.     Activities of Daily Living In your present state of health, do you have any difficulty performing the following activities: 04/25/2019  Hearing? N  Vision? N  Difficulty concentrating or making decisions? N  Walking or climbing stairs? N  Dressing or bathing? N  Doing errands, shopping? N  Preparing Food and eating ? N  Using the Toilet? N  In  the past six months, have you accidently leaked urine? N  Do you have problems with loss of bowel control? N  Managing your Medications? N  Managing your Finances? N  Housekeeping or managing your Housekeeping? N  Some recent data might be hidden    Patient Care Team: Glori Luis, MD as PCP - General (Family Medicine) Iran Ouch, MD as PCP - Cardiology (Cardiology)    Assessment:   This is a routine wellness examination for Zaylyn.  I connected with patient 04/25/19 at 11:30 AM EDT by an audio enabled telemedicine application and verified that I am speaking with the correct person using two identifiers. Patient stated full name and DOB. Patient gave permission to continue with virtual visit. Patient's location was at home and Nurse's location was at [place] office.   Health Maintenance Due: Influenza vaccine 2020- discussed; to be completed in season with doctor or local pharmacy.      See completed HM at the end of note.   Eye: Visual acuity not assessed. Virtual visit. Wears corrective lenses.   Dental: Visits every 6 months.    Hearing: Demonstrates normal hearing during visit.  Safety:  Patient feels safe at home- yes Patient does have smoke detectors at home- yes Patient does wear sunscreen or protective clothing when in direct sunlight - yes Patient does wear seat belt when in a moving vehicle - yes Patient drives- yes Adequate lighting in walkways free  from debris- yes Grab bars and handrails used as appropriate- yes Ambulates with no assistive device Cell phone on person when ambulating outside of the home- yes  Social: Alcohol intake - yes      Smoking history- former Smokers in home? none Illicit drug use? none  Depression: PHQ 2 &9 complete. See screening below. Denies irritability, anhedonia, sadness/tearfullness.  Stable.   Falls: See screening below.    Medication: Taking as directed and without issues.   Covid-19: Precautions and sickness symptoms discussed. Wears mask, social distancing, hand hygiene as appropriate.   Activities of Daily Living Patient denies needing assistance with: household chores, feeding themselves, getting from bed to chair, getting to the toilet, bathing/showering, dressing, managing money, or preparing meals.   Memory: Patient is alert. Patient denies difficulty focusing or concentrating. Correctly identified the president of the [place], season and recall. Patient works Administrator, sports which helps with brain stimulation.  BMI- discussed the importance of a healthy diet, water intake and the benefits of aerobic exercise.  Educational material provided.  Physical activity- walking, no routine   Diet:  Regular  Water: good intake Caffeine: 3 cups coffee daily  Advanced Directive: End of life planning; Advance aging; Advanced directives discussed.  Copy of current HCPOA/Living Will requested.    Other Providers Patient Care Team: Glori Luis, MD as PCP - General (Family Medicine) Iran Ouch, MD as PCP - Cardiology (Cardiology)  Exercise Activities and Dietary recommendations Current Exercise Habits: Home exercise routine, Type of exercise: walking  Goals    . DIET - INCREASE LEAN PROTEINS     Low carb diet       Fall Risk Fall Risk  04/25/2019 03/14/2019 04/19/2018 03/11/2018 01/24/2018  Falls in the past year? 0 0 No No No  Number falls in past yr: - 0 - - -    Timed Get Up and Go performed: no, virtual visit  Depression Screen PHQ 2/9 Scores 04/25/2019 03/14/2019 04/19/2018 03/11/2018  PHQ - 2 Score 0 0 0 0  PHQ- 9 Score - - - -     Cognitive Function     6CIT Screen 04/25/2019 04/19/2018  What Year? 0 points 0 points  What month? 0 points 0 points  What time? 0 points 0 points  Count back from 20 0 points 0 points  Months in reverse 0 points 0 points  Repeat phrase 0 points 0 points  Total Score 0 0    Immunization History  Administered Date(s) Administered  . Influenza, High Dose Seasonal PF 04/19/2018  . Influenza-Unspecified 05/17/2016, 05/17/2017  . Zoster Recombinat (Shingrix) 03/28/2018   Screening Tests Health Maintenance  Topic Date Due  . COLONOSCOPY  10/06/2019  . TETANUS/TDAP  08/07/2021  . INFLUENZA VACCINE  Completed  . DEXA SCAN  Completed  . PNA vac Low Risk Adult  Completed      Plan:    Keep all routine maintenance appointments.   Follow up 05/07/19 @ 430  Medicare Attestation I have personally reviewed: The patient's medical and social history Their use of alcohol, tobacco or illicit drugs Their current medications and supplements The patient's functional ability including ADLs,fall risks, home safety risks, cognitive, and hearing and visual impairment Diet and physical activities Evidence for depression   In addition, I have reviewed and discussed with patient certain preventive protocols, quality metrics, and best practice recommendations. A written personalized care plan for preventive services as well as general preventive health recommendations were provided to patient via mail.     Ashok PallOBrien-Blaney, Denisa L, LPN  4/09/81199/18/2020

## 2019-04-30 ENCOUNTER — Other Ambulatory Visit: Payer: Self-pay

## 2019-04-30 ENCOUNTER — Ambulatory Visit (INDEPENDENT_AMBULATORY_CARE_PROVIDER_SITE_OTHER): Payer: Medicare HMO | Admitting: Family Medicine

## 2019-04-30 VITALS — Ht 59.0 in | Wt 153.0 lb

## 2019-04-30 DIAGNOSIS — Z20822 Contact with and (suspected) exposure to covid-19: Secondary | ICD-10-CM

## 2019-04-30 DIAGNOSIS — R0981 Nasal congestion: Secondary | ICD-10-CM

## 2019-04-30 DIAGNOSIS — R6889 Other general symptoms and signs: Secondary | ICD-10-CM

## 2019-04-30 MED ORDER — FLUTICASONE PROPIONATE 50 MCG/ACT NA SUSP: 2.0000 | Freq: Every day | NASAL | 0 refills | Status: DC

## 2019-04-30 NOTE — Progress Notes (Signed)
Virtual Visit via telephone  Note  This visit type was conducted due to national recommendations for restrictions regarding the COVID-19 pandemic (e.g. social distancing).  This format is felt to be most appropriate for this patient at this time.  All issues noted in this document were discussed and addressed.  No physical exam was performed (except for noted visual exam findings with Video Visits).   I connected with Ashley [name] today at  3:15 PM EDT by telephone and verified that I am speaking with the correct person using two identifiers. Location patient: home Location provider: work  Persons participating in the virtual visit: patient, provider, Cherina Dhillon (husband)  I discussed the limitations, risks, security and privacy concerns of performing an evaluation and management service by telephone and the availability of in person appointments. I also discussed with the patient that there may be a patient responsible charge related to this service. The patient expressed understanding and agreed to proceed.  Interactive audio and video telecommunications were attempted between this provider and patient, however failed, due to patient having technical difficulties OR patient did not have access to video capability.  We continued and completed visit with audio only.   Reason for visit: same day visit  HPI: Sinus congestion: Patient notes onset 4 to 5 days ago.  She notes she typically gets these symptoms this time a year.  She has itchy scratchy throat.  Her ears feel full.  She has some rhinorrhea.  She has some sinus and nasal congestion.  No shortness of breath.  No COVID-19 exposure.  No fevers.  She typically only goes to the grocery store.  She does take cetirizine daily.   ROS: See pertinent positives and negatives per HPI.  Past Medical History:  Diagnosis Date  . Abnormal uterine bleeding (AUB)    a. benign endometrial polyps on D+C in 03/2017  . Anxiety   . Arthritis   .  Chicken pox   . Depression   . GERD (gastroesophageal reflux disease)   . History of stress test    a. 08/2017 Lexiscan MV: EF 55-65%. No ischemia/infarct. Low risk.  Marland Kitchen Hyperlipidemia   . Hypertension   . Lacunar infarction (HCC)    a. Dx in setting of dizziness/vertigo; b. 02/2018 Carotid U/S: bilateral intimal thickening and atherosclerotic plaque. No hemodynamically significant stenoses; c. 03/2018 MRI Brain: No mass or abnl enhancement. Chronic microvascular ischemic changes and mod volume loss of the brain. 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  . Vertigo     Past Surgical History:  Procedure Laterality Date  . DILATATION & CURETTAGE/HYSTEROSCOPY WITH MYOSURE N/A 03/30/2017   Procedure: DILATATION & CURETTAGE/HYSTEROSCOPY WITH MYOSURE;  Surgeon: Schermerhorn, Ihor Austin, MD;  Location: [facility] ORS;  Service: Gynecology;  Laterality: N/A;  . EYE SURGERY Bilateral    Cataract Extraction with IOL  . SKIN LESION EXCISION    . TONSILLECTOMY AND ADENOIDECTOMY     Age 78    Family History  Problem Relation Age of Onset  . Rheum arthritis Mother   . Ovarian cancer Sister   . COPD Sister   . CAD Sister   . Heart disease Father   . Aneurysm Father   . Cancer Brother   . Thrombosis Maternal Grandmother        a. DVT  . Thrombosis Maternal Grandfather        a. DVT  . Thrombosis Paternal Grandmother        a. DVT  .  Thrombosis Paternal Grandfather        a. DVT    SOCIAL HX: Former smoker.   Current Outpatient Medications:  .  aspirin EC 81 MG tablet, Take 81 mg by mouth daily., Disp: , Rfl:  .  EPINEPHrine (EPIPEN 2-PAK) 0.3 mg/0.3 mL IJ SOAJ injection, Inject 0.3 mLs (0.3 mg total) into the muscle as needed for anaphylaxis., Disp: 1 each, Rfl: 1 .  fluticasone (FLONASE) 50 MCG/ACT nasal spray, Place 2 sprays into both nostrils daily., Disp: 16 g, Rfl: 0 .  gabapentin (NEURONTIN) 100 MG capsule, Take 1 capsule (100 mg total) by mouth at bedtime. (Patient taking  differently: Take 100 mg by mouth at bedtime. ), Disp: 90 capsule, Rfl: 1 .  guaiFENesin (MUCINEX) 600 MG 12 hr tablet, Take 600 mg by mouth 2 (two) times daily., Disp: , Rfl:  .  hydrochlorothiazide (HYDRODIURIL) 25 MG tablet, Take 1 tablet (25 mg total) by mouth daily., Disp: 90 tablet, Rfl: 1 .  HYDROcodone-acetaminophen (NORCO/VICODIN) 5-325 MG tablet, 1/2-1 po bid prn, Disp: , Rfl:  .  MYRBETRIQ 25 MG TB24 tablet, , Disp: , Rfl:  .  nitrofurantoin, macrocrystal-monohydrate, (MACROBID) 100 MG capsule, , Disp: , Rfl:  .  omeprazole (PRILOSEC) 20 MG capsule, Take 1 capsule (20 mg total) by mouth daily., Disp: 90 capsule, Rfl: 1 .  progesterone (PROMETRIUM) 100 MG capsule, Take 100 mg by mouth daily., Disp: , Rfl:  .  rosuvastatin (CRESTOR) 40 MG tablet, TAKE 1 TABLET BY MOUTH DAILY, Disp: 90 tablet, Rfl: 1 .  venlafaxine XR (EFFEXOR-XR) 150 MG 24 hr capsule, TAKE 1 CAPSULE BY MOUTH ONCE DAILY, Disp: 90 capsule, Rfl: 2  EXAM: This is a telehealth telephone visit and thus no physical exam was completed.  ASSESSMENT AND PLAN:  Discussed the following assessment and plan:  Sinus congestion Patient symptoms could be allergic rhinitis, viral related, or COVID-19.  Discussed COVID-19 testing.  Discussed strict quarantine precautions for her and her husband until the COVID-19 test results are back.  Health department forms were sent to the patient through my chart.  She was signed up for my chart monitoring.  She will have somebody pick up Flonase for her and leave it at her door.  She will continue cetirizine.  She is given reasons to seek medical attention.    I discussed the assessment and treatment plan with the patient. The patient was provided an opportunity to ask questions and all were answered. The patient agreed with the plan and demonstrated an understanding of the instructions.   The patient was advised to call back or seek an in-person evaluation if the symptoms worsen or if the  condition fails to improve as anticipated.  I provided 14 minutes of non-face-to-face time during this encounter.   Tommi Rumps, MD

## 2019-05-01 ENCOUNTER — Other Ambulatory Visit: Payer: Self-pay

## 2019-05-01 DIAGNOSIS — Z20822 Contact with and (suspected) exposure to covid-19: Secondary | ICD-10-CM

## 2019-05-01 DIAGNOSIS — R6889 Other general symptoms and signs: Secondary | ICD-10-CM | POA: Diagnosis not present

## 2019-05-02 ENCOUNTER — Telehealth: Payer: Self-pay

## 2019-05-02 LAB — NOVEL CORONAVIRUS, NAA: SARS-CoV-2, NAA: NOT DETECTED

## 2019-05-02 NOTE — Telephone Encounter (Signed)
Copied from [place] [id number]. Topic: General - Inquiry >> May 02, 2019  1:14 PM Selinda Flavin B, NT wrote: Reason for CRM: Patient calling and states that she was sent to get a COVID test a few days ago and the results are back and negative. States that she was told by Dr Caryl Bis that if the results were negative to call back and he would send something into her pharmacy for her sinus issues. Please advise. CB#: [phone]

## 2019-05-03 ENCOUNTER — Encounter: Payer: Self-pay | Admitting: Family Medicine

## 2019-05-03 DIAGNOSIS — J40 Bronchitis, not specified as acute or chronic: Secondary | ICD-10-CM | POA: Insufficient documentation

## 2019-05-03 DIAGNOSIS — R0981 Nasal congestion: Secondary | ICD-10-CM | POA: Insufficient documentation

## 2019-05-03 NOTE — Assessment & Plan Note (Signed)
Patient symptoms could be allergic rhinitis, viral related, or COVID-19.  Discussed COVID-19 testing.  Discussed strict quarantine precautions for her and her husband until the COVID-19 test results are back.  Health department forms were sent to the patient through my chart.  She was signed up for my chart monitoring.  She will have somebody pick up Flonase for her and leave it at her door.  She will continue cetirizine.  She is given reasons to seek medical attention.

## 2019-05-07 ENCOUNTER — Other Ambulatory Visit: Payer: Self-pay

## 2019-05-07 ENCOUNTER — Encounter: Payer: Self-pay | Admitting: Family Medicine

## 2019-05-07 ENCOUNTER — Ambulatory Visit (INDEPENDENT_AMBULATORY_CARE_PROVIDER_SITE_OTHER): Payer: Medicare HMO | Admitting: Family Medicine

## 2019-05-07 DIAGNOSIS — M25562 Pain in left knee: Secondary | ICD-10-CM | POA: Diagnosis not present

## 2019-05-07 DIAGNOSIS — I10 Essential (primary) hypertension: Secondary | ICD-10-CM | POA: Diagnosis not present

## 2019-05-07 DIAGNOSIS — G5603 Carpal tunnel syndrome, bilateral upper limbs: Secondary | ICD-10-CM

## 2019-05-07 DIAGNOSIS — N393 Stress incontinence (female) (male): Secondary | ICD-10-CM | POA: Diagnosis not present

## 2019-05-07 DIAGNOSIS — G8929 Other chronic pain: Secondary | ICD-10-CM | POA: Diagnosis not present

## 2019-05-07 DIAGNOSIS — T7840XA Allergy, unspecified, initial encounter: Secondary | ICD-10-CM

## 2019-05-07 DIAGNOSIS — M25561 Pain in right knee: Secondary | ICD-10-CM

## 2019-05-07 DIAGNOSIS — R0981 Nasal congestion: Secondary | ICD-10-CM | POA: Diagnosis not present

## 2019-05-07 DIAGNOSIS — G5600 Carpal tunnel syndrome, unspecified upper limb: Secondary | ICD-10-CM | POA: Insufficient documentation

## 2019-05-07 NOTE — Assessment & Plan Note (Signed)
Advised against CBD oil.  Discussed that this is not a regulated substance and there is the potential for medication interactions that are not known.  She will continue to see orthopedics.

## 2019-05-07 NOTE — Assessment & Plan Note (Signed)
She will see orthopedics for this. 

## 2019-05-07 NOTE — Assessment & Plan Note (Signed)
She will continue to see urology. 

## 2019-05-07 NOTE — Assessment & Plan Note (Signed)
She will continue on cetirizine.  Monitor for any recurrence.

## 2019-05-07 NOTE — Assessment & Plan Note (Signed)
Likely viral illness or allergies.  Symptoms have resolved.  She had negative COVID testing.  She will monitor for recurrence.

## 2019-05-07 NOTE — Progress Notes (Signed)
Virtual Visit via telephone Note  This visit type was conducted due to national recommendations for restrictions regarding the COVID-19 pandemic (e.g. social distancing).  This format is felt to be most appropriate for this patient at this time.  All issues noted in this document were discussed and addressed.  No physical exam was performed (except for noted visual exam findings with Video Visits).   I connected with Arlys John today at  4:30 PM EDT by telephone and verified that I am speaking with the correct person using two identifiers. Location patient: home Location provider: work  Persons participating in the virtual visit: patient, provider  I discussed the limitations, risks, security and privacy concerns of performing an evaluation and management service by telephone and the availability of in person appointments. I also discussed with the patient that there may be a patient responsible charge related to this service. The patient expressed understanding and agreed to proceed.  Interactive audio and video telecommunications were attempted between this provider and patient, however failed, due to patient having technical difficulties OR patient did not have access to video capability.  We continued and completed visit with audio only.   Reason for visit: Follow-up.  HPI: Carpal tunnel syndrome: Bilateral hand pain.  She is following with orthopedics for this.  They prescribed meloxicam.  They are going to get her set up for surgery later this year.  Hypertension: Not checking blood pressures.  Taking HCTZ.  No chest pain, shortness of breath, or edema.  Upper respiratory symptoms: These resolved.  COVID-19 testing was negative.  Stress incontinence: She is having a cystoscopy with a urethral bulking agent to be injected to help with stress incontinence.  Chronic knee pain: She is also following with orthopedics for this.  She wants to try CBD oil.  Allergic reaction: She has  noted no additional allergic reaction issues.  She did see an allergist and they could not find a cause.  They advised her to start on cetirizine.   ROS: See pertinent positives and negatives per HPI.  Past Medical History:  Diagnosis Date  . Abnormal uterine bleeding (AUB)    a. benign endometrial polyps on D+C in 03/2017  . Anxiety   . Arthritis   . Chicken pox   . Depression   . GERD (gastroesophageal reflux disease)   . History of stress test    a. 08/2017 Lexiscan MV: EF 55-65%. No ischemia/infarct. Low risk.  Marland Kitchen Hyperlipidemia   . Hypertension   . 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 Dx in setting of dizziness/vertigo; b. 02/2018 Carotid U/S: bilateral intimal thickening and atherosclerotic plaque. No hemodynamically significant stenoses; c. 03/2018 MRI Brain: No mass or abnl enhancement. Chronic microvascular ischemic changes and mod volume loss of the brain. 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  . Vertigo     Past Surgical History:  Procedure Laterality Date  . DILATATION & CURETTAGE/HYSTEROSCOPY WITH MYOSURE N/A 03/30/2017   Procedure: DILATATION & CURETTAGE/HYSTEROSCOPY WITH MYOSURE;  Surgeon: Schermerhorn, Gwen Her, MD;  Location: [facility];  Service: Gynecology;  Laterality: N/A;  . EYE SURGERY Bilateral    Cataract Extraction with IOL  . SKIN LESION EXCISION    . TONSILLECTOMY AND ADENOIDECTOMY     Age 78    Family History  Problem Relation Age of Onset  . Rheum arthritis Mother   . Ovarian cancer Sister   . COPD Sister   . CAD Sister   . Heart disease Father   .  Aneurysm Father   . Cancer Brother   . Thrombosis Maternal Grandmother        a. DVT  . Thrombosis Maternal Grandfather        a. DVT  . Thrombosis Paternal Grandmother        a. DVT  . Thrombosis Paternal Grandfather        a. DVT    SOCIAL HX: Former smoker.   Current Outpatient Medications:  .  aspirin EC 81 MG tablet, Take 81 mg by mouth daily., Disp: , Rfl:  .  EPINEPHrine  (EPIPEN 2-PAK) 0.3 mg/0.3 mL IJ SOAJ injection, Inject 0.3 mLs (0.3 mg total) into the muscle as needed for anaphylaxis., Disp: 1 each, Rfl: 1 .  fluticasone (FLONASE) 50 MCG/ACT nasal spray, Place 2 sprays into both nostrils daily., Disp: 16 g, Rfl: 0 .  gabapentin (NEURONTIN) 100 MG capsule, Take 1 capsule (100 mg total) by mouth at bedtime. (Patient taking differently: Take 100 mg by mouth at bedtime. ), Disp: 90 capsule, Rfl: 1 .  guaiFENesin (MUCINEX) 600 MG 12 hr tablet, Take 600 mg by mouth 2 (two) times daily., Disp: , Rfl:  .  hydrochlorothiazide (HYDRODIURIL) 25 MG tablet, Take 1 tablet (25 mg total) by mouth daily., Disp: 90 tablet, Rfl: 1 .  HYDROcodone-acetaminophen (NORCO/VICODIN) 5-325 MG tablet, 1/2-1 po bid prn, Disp: , Rfl:  .  MYRBETRIQ 25 MG TB24 tablet, , Disp: , Rfl:  .  nitrofurantoin, macrocrystal-monohydrate, (MACROBID) 100 MG capsule, , Disp: , Rfl:  .  omeprazole (PRILOSEC) 20 MG capsule, Take 1 capsule (20 mg total) by mouth daily., Disp: 90 capsule, Rfl: 1 .  progesterone (PROMETRIUM) 100 MG capsule, Take 100 mg by mouth daily., Disp: , Rfl:  .  rosuvastatin (CRESTOR) 40 MG tablet, TAKE 1 TABLET BY MOUTH DAILY, Disp: 90 tablet, Rfl: 1 .  venlafaxine XR (EFFEXOR-XR) 150 MG 24 hr capsule, TAKE 1 CAPSULE BY MOUTH ONCE DAILY, Disp: 90 capsule, Rfl: 2  EXAM: This was a telehealth telephone visit and thus no physical exam was completed.  ASSESSMENT AND PLAN:  Discussed the following assessment and plan:  Essential hypertension Undetermined control.  She will start checking at home and let us know the readings.  Continue HCTZ.  Plan for lab work at next visit.  Sinus congestion Likely viral illness or allergies.  Symptoms have resolved.  She had negative COVID testing.  She will monitor for recurrence.  Carpal tunnel syndrome She will see orthopedics for this.  Bilateral knee pain Advised against CBD oil.  Discussed that this is not a regulated substance and there  is the potential for medication interactions that are not known.  She will continue to see orthopedics.  Allergic reaction She will continue on cetirizine.  Monitor for any recurrence.  Stress incontinence She will continue to see urology.    I discussed the assessment and treatment plan with the patient. The patient was provided an opportunity to ask questions and all were answered. The patient agreed with the plan and demonstrated an understanding of the instructions.   The patient was advised to call back or seek an in-person evaluation if the symptoms worsen or if the condition fails to improve as anticipated.  I provided 11 minutes of non-face-to-face time during this encounter.   [name], MD

## 2019-05-07 NOTE — Telephone Encounter (Signed)
I called and spoke with the patient and she had responded to mychart and she states she is better.  Nina,cma

## 2019-05-07 NOTE — Assessment & Plan Note (Signed)
Undetermined control.  She will start checking at home and let us know the readings.  Continue HCTZ.  Plan for lab work at next visit.

## 2019-05-08 NOTE — H&P (Signed)
NAME: Ashley Ashley [name], Ashley Ashley [name]. MEDICAL RECORD [id number] ACCOUNT [id number] DATE OF BIRTH:[birth year] FACILITY: [facility] LOCATION: [facility] PHYSICIAN:MICHAEL Farrel Conners, MD  HISTORY AND PHYSICAL  DATE OF ADMISSION:  05/15/2019  CHIEF COMPLAINT:  Urinary incontinence.  HISTORY OF PRESENT ILLNESS:  The patient is Ashley [name] 78 year old Caucasian female with Ashley [name] greater than 1-year history of mixed urinary incontinence.  She typically has incontinence with coughing and sneezing and wears several pads during the day.  She also has  urgency and occasional urge incontinence.  She complains of nocturia.  She was placed on Myrbetriq 25 mg daily and did have improvement of her frequency, urgency, and nocturia, but not the stress incontinence.  She has Ashley [name] history of having undergone Ashley [name]  Macroplastique injection in 2012 which worked quite well until just the past year.  She comes in now for Macroplastique injection.  Urodynamic study performed on 03/10/2019 revealed Ashley [name] bladder capacity of 430 mL with some sensory urgency.  She had no  postvoid residual.  She had increased EMG activity while voiding.  ALLERGIES:  No drug allergies.  CURRENT MEDICATIONS:  Myrbetriq, Meloxicam, venlafaxine, aspirin, gabapentin, PreserVision, HCTZ, stool softener, rosuvastatin and omeprazole.  PAST SURGICAL HISTORY:  Tonsillectomy in 1960, bilateral cataract surgery, D and C with hysteroscopy 2018, Macroplastique injection 2012, lumbar spine surgery 2019.  PAST AND CURRENT MEDICAL CONDITIONS: 1. Hypertension. 2. Hypercholesterolemia. 3. GERD. 4. Anxiety. 5. Restless leg syndrome. 6. History of lacunar infarct. 7. Vertigo. 8. Arthritis. 9. History of negative cardiac stress test 2019 with ejection fraction of 55-65%.  REVIEW OF SYSTEMS:  The patient has decreased visual acuity.  She denies chest pain, shortness of breath, diabetes.  SOCIAL HISTORY:  The patient quit smoking 40 years ago.  She denied alcohol use.  FAMILY HISTORY:   Father died at age 35 of end-stage renal disease.  Mother essentially died of old age at age 51.  PHYSICAL EXAMINATION: VITAL SIGNS:  Height 4 feet 9 inches, weight 154. GENERAL:  Obese white female in no acute distress. HEENT:  Sclerae were clear.  Pupils are equally round, reactive to light and accommodation.  Extraocular movements are intact. NECK:  No palpable masses or tenderness.  Thyroid gland was smooth and nontender, without palpable nodules.  No audible carotid bruits. PULMONARY:  Lungs are clear to auscultation. CARDIOVASCULAR:  Regular rhythm and rate without audible murmurs. ABDOMEN:  Soft, nontender abdomen. GENITOURINARY:  Atrophic external genitalia.  The patient had good anterior support, but had Ashley [name] positive Marshall test.  Urethral meatus was normally situated and appeared normal externally. RECTAL:  No palpable rectal masses. NEUROMUSCULAR:  Alert and oriented x3.  IMPRESSION: 1. Type 3 stress incontinence. 2. Overactive bladder.  PLAN:  Macroplastique injection.     PN/NUANCE  D:05/08/2019 T:05/08/2019 JOB:[id number]

## 2019-05-12 ENCOUNTER — Encounter
Admission: RE | Admit: 2019-05-12 | Discharge: 2019-05-12 | Disposition: A | Discharge status: Home / Self Care | Payer: Medicare HMO | Source: Ambulatory Visit | Attending: Urology | Admitting: Urology

## 2019-05-12 ENCOUNTER — Other Ambulatory Visit: Payer: Self-pay

## 2019-05-12 DIAGNOSIS — Z20828 Contact with and (suspected) exposure to other viral communicable diseases: Secondary | ICD-10-CM | POA: Diagnosis not present

## 2019-05-12 DIAGNOSIS — I10 Essential (primary) hypertension: Secondary | ICD-10-CM | POA: Insufficient documentation

## 2019-05-12 DIAGNOSIS — Z01818 Encounter for other preprocedural examination: Secondary | ICD-10-CM | POA: Insufficient documentation

## 2019-05-12 DIAGNOSIS — I498 Other specified cardiac arrhythmias: Secondary | ICD-10-CM | POA: Diagnosis not present

## 2019-05-12 DIAGNOSIS — R32 Unspecified urinary incontinence: Secondary | ICD-10-CM | POA: Insufficient documentation

## 2019-05-12 DIAGNOSIS — N3090 Cystitis, unspecified without hematuria: Secondary | ICD-10-CM | POA: Diagnosis not present

## 2019-05-12 DIAGNOSIS — Z01810 Encounter for preprocedural cardiovascular examination: Secondary | ICD-10-CM | POA: Diagnosis not present

## 2019-05-12 HISTORY — DX: Cerebral infarction, unspecified: I63.9

## 2019-05-12 LAB — BASIC METABOLIC PANEL
Anion gap: 11 (ref 5–15)
BUN: 14 mg/dL (ref 8–23)
CO2: 25 mmol/L (ref 22–32)
Calcium: 9.3 mg/dL (ref 8.9–10.3)
Chloride: 98 mmol/L (ref 98–111)
Creatinine, Ser: 0.77 mg/dL (ref 0.44–1.00)
GFR calc Af Amer: >60 mL/min (ref >60)
GFR calc non Af Amer: >60 mL/min (ref >60)
Glucose, Bld: 135 mg/dL — ABNORMAL HIGH (ref 70–99)
Potassium: 3.6 mmol/L (ref 3.5–5.1)
Sodium: 134 mmol/L — ABNORMAL LOW (ref 135–145)

## 2019-05-12 LAB — CBC
HCT: 42.9 % (ref 36.0–46.0)
Hemoglobin: 13.6 g/dL (ref 12.0–15.0)
MCH: 29.1 pg (ref 26.0–34.0)
MCHC: 31.7 g/dL (ref 30.0–36.0)
MCV: 91.7 fL (ref 80.0–100.0)
Platelets: 344 10*3/uL (ref 150–400)
RBC: 4.68 MIL/uL (ref 3.87–5.11)
RDW: 13.1 % (ref 11.5–15.5)
WBC: 6.9 10*3/uL (ref 4.0–10.5)
nRBC: 0 % (ref 0.0–0.2)

## 2019-05-12 LAB — SARS CORONAVIRUS 2 (TAT 6-24 HRS): SARS Coronavirus 2: NEGATIVE

## 2019-05-12 NOTE — Pre-Procedure Instructions (Signed)
Glori LuisSonnenberg, Eric G, MD  Physician  Family Medicine  Progress Notes    Signed  Encounter Date:  05/07/2019          Signed         Show:Clear all [x] Manual[x] Template[] Copied  Added by: [x] Glori LuisSonnenberg, Eric G, MD  [] Hover for details   Virtual Visit via telephone Note  This visit type was conducted due to national recommendations for restrictions regarding the COVID-19 pandemic (e.g. social distancing). This format is felt to be most appropriate for this patient at this time. All issues noted in this document were discussed and addressed. No physical exam was performed (except for noted visual exam findings with Video Visits).   I connected with Ashley [name] today at  4:30 PM EDT by telephone and verified that I am speaking with the correct person using two identifiers. Location patient: home Location provider: work  Persons participating in the virtual visit: patient, provider  I discussed the limitations, risks, security and privacy concerns of performing an evaluation and management service by telephone and the availability of in person appointments. I also discussed with the patient that there may be a patient responsible charge related to this service. The patient expressed understanding and agreed to proceed.  Interactive audio and video telecommunications were attempted between this provider and patient, however failed, due to patient having technical difficulties OR patient did not have access to video capability.  We continued and completed visit with audio only.   Reason for visit: Follow-up.  HPI: Carpal tunnel syndrome: Bilateral hand pain.  She is following with orthopedics for this.  They prescribed meloxicam.  They are going to get her set up for surgery later this year.  Hypertension: Not checking blood pressures.  Taking HCTZ.  No chest pain, shortness of breath, or edema.  Upper respiratory symptoms: These resolved.  COVID-19 testing was  negative.  Stress incontinence: She is having a cystoscopy with a urethral bulking agent to be injected to help with stress incontinence.  Chronic knee pain: She is also following with orthopedics for this.  She wants to try CBD oil.  Allergic reaction: She has noted no additional allergic reaction issues.  She did see an allergist and they could not find a cause.  They advised her to start on cetirizine.   ROS: See pertinent positives and negatives per HPI.      Past Medical History:  Diagnosis Date  . Abnormal uterine bleeding (AUB)    a. benign endometrial polyps on D+C in 03/2017  . Anxiety   . Arthritis   . Chicken pox   . Depression   . GERD (gastroesophageal reflux disease)   . History of stress test    a. 08/2017 Lexiscan MV: EF 55-65%. No ischemia/infarct. Low risk.  Marland Kitchen. Hyperlipidemia   . Hypertension   . Lacunar infarction (HCC)    a. Dx in setting of dizziness/vertigo; b. 02/2018 Carotid U/S: bilateral intimal thickening and atherosclerotic plaque. No hemodynamically significant stenoses; c. 03/2018 MRI Brain: No mass or abnl enhancement. Chronic microvascular ischemic changes and mod volume loss of the brain. 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  . Vertigo          Past Surgical History:  Procedure Laterality Date  . DILATATION & CURETTAGE/HYSTEROSCOPY WITH MYOSURE N/A 03/30/2017   Procedure: DILATATION & CURETTAGE/HYSTEROSCOPY WITH MYOSURE;  Surgeon: Schermerhorn, Ihor Austinhomas J, MD;  Location: [facility] ORS;  Service: Gynecology;  Laterality: N/A;  . EYE SURGERY Bilateral  Cataract Extraction with IOL  . SKIN LESION EXCISION    . TONSILLECTOMY AND ADENOIDECTOMY     Age 78         Family History  Problem Relation Age of Onset  . Rheum arthritis Mother   . Ovarian cancer Sister   . COPD Sister   . CAD Sister   . Heart disease Father   . Aneurysm Father   . Cancer Brother   . Thrombosis Maternal Grandmother         a. DVT  . Thrombosis Maternal Grandfather        a. DVT  . Thrombosis Paternal Grandmother        a. DVT  . Thrombosis Paternal Grandfather        a. DVT    SOCIAL HX: Former smoker.   Current Outpatient Medications:  .  aspirin EC 81 MG tablet, Take 81 mg by mouth daily., Disp: , Rfl:  .  EPINEPHrine (EPIPEN 2-PAK) 0.3 mg/0.3 mL IJ SOAJ injection, Inject 0.3 mLs (0.3 mg total) into the muscle as needed for anaphylaxis., Disp: 1 each, Rfl: 1 .  fluticasone (FLONASE) 50 MCG/ACT nasal spray, Place 2 sprays into both nostrils daily., Disp: 16 g, Rfl: 0 .  gabapentin (NEURONTIN) 100 MG capsule, Take 1 capsule (100 mg total) by mouth at bedtime. (Patient taking differently: Take 100 mg by mouth at bedtime. ), Disp: 90 capsule, Rfl: 1 .  guaiFENesin (MUCINEX) 600 MG 12 hr tablet, Take 600 mg by mouth 2 (two) times daily., Disp: , Rfl:  .  hydrochlorothiazide (HYDRODIURIL) 25 MG tablet, Take 1 tablet (25 mg total) by mouth daily., Disp: 90 tablet, Rfl: 1 .  HYDROcodone-acetaminophen (NORCO/VICODIN) 5-325 MG tablet, 1/2-1 po bid prn, Disp: , Rfl:  .  MYRBETRIQ 25 MG TB24 tablet, , Disp: , Rfl:  .  nitrofurantoin, macrocrystal-monohydrate, (MACROBID) 100 MG capsule, , Disp: , Rfl:  .  omeprazole (PRILOSEC) 20 MG capsule, Take 1 capsule (20 mg total) by mouth daily., Disp: 90 capsule, Rfl: 1 .  progesterone (PROMETRIUM) 100 MG capsule, Take 100 mg by mouth daily., Disp: , Rfl:  .  rosuvastatin (CRESTOR) 40 MG tablet, TAKE 1 TABLET BY MOUTH DAILY, Disp: 90 tablet, Rfl: 1 .  venlafaxine XR (EFFEXOR-XR) 150 MG 24 hr capsule, TAKE 1 CAPSULE BY MOUTH ONCE DAILY, Disp: 90 capsule, Rfl: 2  EXAM: This was a telehealth telephone visit and thus no physical exam was completed.  ASSESSMENT AND PLAN:  Discussed the following assessment and plan:  Essential hypertension Undetermined control.  She will start checking at home and let us know the readings.  Continue HCTZ.  Plan for lab  work at next visit.  Sinus congestion Likely viral illness or allergies.  Symptoms have resolved.  She had negative COVID testing.  She will monitor for recurrence.  Carpal tunnel syndrome She will see orthopedics for this.  Bilateral knee pain Advised against CBD oil.  Discussed that this is not a regulated substance and there is the potential for medication interactions that are not known.  She will continue to see orthopedics.  Allergic reaction She will continue on cetirizine.  Monitor for any recurrence.  Stress incontinence She will continue to see urology.   I discussed the assessment and treatment plan with the patient. The patient was provided an opportunity to ask questions and all were answered. The patient agreed with the plan and demonstrated an understanding of the instructions.  The patient was advised to call back  or seek an in-person evaluation if the symptoms worsen or if the condition fails to improve as anticipated.  I provided 11 minutes of non-face-to-face time during this encounter.   [name], MD          Electronically signed by Glori Luis, MD at 05/07/2019 5:01 PM   Office Visit on 05/07/2019     Detailed Report     Note shared with patient

## 2019-05-12 NOTE — Pre-Procedure Instructions (Signed)
[name], [name], NP  Nurse Practitioner  Cardiology  Progress Notes  Signed  Encounter Date:  04/11/2018          Signed         Show:Clear all [x] Manual[x] Template[] Copied  Added by: [x] [name], [name], NP  [] Hover for details    Office Visit    Patient Name: Ashley [name] Date of Encounter: 04/11/2018  Primary Care Provider:  Glori LuisSonnenberg, Eric G, MD Primary Cardiologist:  Lorine BearsMuhammad Arida, MD  Chief Complaint    78 year old female with prior history of atypical jaw pain, hypertension, hyperlipidemia, GERD, depression, arthritis, and anxiety who presents for follow-up after recent carotid ultrasound.    Past Medical History        Past Medical History:  Diagnosis Date  . Abnormal uterine bleeding (AUB)    a. benign endometrial polyps on D+C in 03/2017  . Anxiety   . Arthritis   . Chicken pox   . Depression   . GERD (gastroesophageal reflux disease)   . History of stress test    a. 08/2017 Lexiscan MV: EF 55-65%. No ischemia/infarct. Low risk.  Marland Kitchen. Hyperlipidemia   . Hypertension   . Lacunar infarction (HCC)    a. Dx in setting of dizziness/vertigo; b. 02/2018 Carotid U/S: bilateral intimal thickening and atherosclerotic plaque. No hemodynamically significant stenoses; c. 03/2018 MRI Brain: No mass or abnl enhancement. Chronic microvascular ischemic changes and mod volume loss of the brain. 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  . Vertigo         Past Surgical History:  Procedure Laterality Date  . DILATATION & CURETTAGE/HYSTEROSCOPY WITH MYOSURE N/A 03/30/2017   Procedure: DILATATION & CURETTAGE/HYSTEROSCOPY WITH MYOSURE;  Surgeon: Schermerhorn, Ihor Austinhomas J, MD;  Location: [facility] ORS;  Service: Gynecology;  Laterality: N/A;  . EYE SURGERY Bilateral    Cataract Extraction with IOL  . SKIN LESION EXCISION    . TONSILLECTOMY AND ADENOIDECTOMY     Age 78    Allergies       Allergies  Allergen  Reactions  . Ace Inhibitors     Angioedema  . Pravastatin Other (See Comments)    Other reaction(s): Muscle Pain  . Simvastatin Other (See Comments)    stiffness    History of Present Illness    78 year old female with a history of hypertension, hyperlipidemia, GERD, depression, arthritis, and chronic back pain.  She was previously evaluated in clinic in January of this year secondary to atypical jaw pain that occurred with eating and improved with Gas-X.  She was seen by Dr. Kirke CorinArida and underwent Lexiscan stress testing, which showed normal LV function without ischemia or infarct.  More recently, she has been evaluated by primary care for vertiginous symptoms and dizziness.  In that setting, she underwent carotid ultrasound in July which showed no hemodynamically significant stenoses.  This was followed by MRI of the brain in August which showed small, chronic left hemi-pons and bilateral thalami lacunar infarcts.  She has follow-up with primary care scheduled.  She made an appointment to follow-up with us related to her carotid ultrasound.  Dizziness has improved following treatment with steroids by primary care.  She denies chest pain, palpitations, dyspnea, pnd, orthopnea, n, v, syncope, edema, weight gain, or early satiety.   Home Medications           Prior to Admission medications   Medication Sig Start Date End Date Taking? Authorizing Provider  aspirin EC 81 MG tablet Take 81 mg by  mouth daily.    [provider]  diazepam (VALIUM) 5 MG tablet Take 1 tablet (5 mg total) by mouth every 12 (twelve) hours as needed for anxiety. 03/26/18   Sherlene Shams, MD  estradiol (ESTRACE) 1 MG tablet take 2 tablets by mouth every morning 06/10/15   [provider]  fluticasone (FLONASE) 50 MCG/ACT nasal spray Place 2 sprays into both nostrils daily. 03/26/18   Sherlene Shams, MD  gabapentin (NEURONTIN) 100 MG capsule Take 1 capsule (100 mg total) by mouth at bedtime.  01/24/18   McLean-Scocuzza, Pasty Spillers, MD  hydrochlorothiazide (HYDRODIURIL) 25 MG tablet Take 1 tablet (25 mg total) by mouth daily. 04/15/16   Minna Antis, MD  meloxicam (MOBIC) 15 MG tablet Take by mouth. 03/09/18   [provider]  omeprazole (PRILOSEC) 20 MG capsule Take 1 capsule (20 mg total) by mouth daily. 03/18/18   Glori Luis, MD  predniSONE (DELTASONE) 10 MG tablet 6 tablets on Day 1 , then reduce by 1 tablet daily until gone 03/26/18   Sherlene Shams, MD  progesterone (PROMETRIUM) 100 MG capsule Take 100 mg by mouth daily.    [provider]  rosuvastatin (CRESTOR) 20 MG tablet TAKE ONE TABLET BY MOUTH EVERY DAY 03/07/18   Glori Luis, MD  tiZANidine (ZANAFLEX) 2 MG tablet Take 1 tablet (2 mg total) by mouth 3 (three) times daily. Patient taking differently: Take 2 mg by mouth as needed.  07/12/17   Roddie Mc, FNP  traMADol (ULTRAM) 50 MG tablet Take 1 tablet (50 mg total) by mouth 2 (two) times daily as needed. 03/11/18   McLean-Scocuzza, Pasty Spillers, MD  venlafaxine XR (EFFEXOR-XR) 150 MG 24 hr capsule TAKE 1 CAPSULE BY MOUTH DAILY 10/09/17   Glori Luis, MD    Review of Systems    Dizziness has improved.  She denies chest pain, palpitations, dyspnea, pnd, orthopnea, n, v, syncope, edema, weight gain, or early satiety.  All other systems reviewed and are otherwise negative except as noted above.  Physical Exam    VS:  BP 120/70 (BP Location: Left Arm, Patient Position: Sitting, Cuff Size: Normal)   Pulse 88   Ht 4\' 11"  (1.499 m)   Wt 149 lb (67.6 kg)   BMI 30.09 kg/m  , BMI Body mass index is 30.09 kg/m. GEN: Well nourished, well developed, in no acute distress. HEENT: normal. Neck: Supple, no JVD, carotid bruits, or masses. Cardiac: RRR, no murmurs, rubs, or gallops. No clubbing, cyanosis, edema.  Radials/DP/PT 2+ and equal bilaterally.  Respiratory:  Respirations regular and unlabored, clear to auscultation  bilaterally. GI: Soft, nontender, nondistended, BS + x 4. MS: no deformity or atrophy. Skin: warm and dry, no rash. Neuro:  Strength and sensation are intact. Psych: Normal affect.  Accessory Clinical Findings    ECG personally reviewed by me today -sinus arrhythmia, 88- no acute changes.  Assessment & Plan    1.  History of atypical jaw pain: Negative stress testing earlier this year.  No recurrence.  No further evaluation.  2.  Dizziness: She underwent work-up by primary care including carotid ultrasound.  This shows minimal plaquing.  She is on a statin.  MRI showed old lacunar infarcts.  Dizziness resolved following treatment with prednisone.  No cardiac work-up necessary at this time.  3.  Hyperlipidemia: She is on Crestor therapy.  Recommend continuation of the setting of finding of minimal plaquing in the carotids.  She has not had lipids  since 2017.  I offered to arrange however, she says she has follow-up with primary care soon and will get this done through their office.  4.  Essential HTN:  Stable.  5.  Disposition: Follow-up as needed.   Murray Hodgkins, NP 04/11/2018, 5:03 PM         Electronically signed by Theora Gianotti, NP at 04/11/2018 5:08 PM   Office Visit on 04/11/2018     Detailed Report

## 2019-05-12 NOTE — Pre-Procedure Instructions (Signed)
NM Myocar Multi W/Spect W/Wall Motion / EF (Accession [id number]) (Order [id number]) Imaging  Date: 08/17/2017 Department: [facility] NUCLEAR MEDICINE Released By: Rockne Menghini Authorizing: Iran Ouch, MD        Exam Information Status Exam Begun   Exam Ended    Final [99] 08/17/2017 7:21 AM 08/17/2017 9:27 AM        PACS Intelerad Image Link Show images for NM Myocar Multi W/Spect W/Wall Motion / EF     Study Result There was no ST segment deviation noted during stress.  No T wave inversion was noted during stress.  The study is normal.  This is a low risk study.  The left ventricular ejection fraction is normal (55-65%).  Scans on Order [id number] Scan on 08/21/2017 8:51 AM by Default, Provider, MD    Result History NM Myocar Multi W/Spect W/Wall Motion / EF (Order #[id number]) on 08/17/2017 - Order Result History Report  Result Notes for NM Myocar Multi W/Spect W/Wall Motion / EF Notes recorded by Shon Baton, RN on 08/20/2017 at 8:33 AM EST  Left detailed message w/results and call back number if questions on pt's cell VM  ------   Notes recorded by Iran Ouch, MD on 08/17/2017 at 3:35 PM EST  Inform patient that stress test was normal.            Encounter-Level Documents - 08/17/2017: Scan on 08/17/2017 12:22 PM by Default, Provider, MD  Electronic signature on 08/17/2017 7:03 AM - Leta Jungling      Order-Level Documents - 08/17/2017: Scan on 08/21/2017 8:51 AM by Default, Provider, MD      Hospital account-Level Documents: There are no hospital account-level documents.       Vitals Height Weight BMI (Calculated)  4\' 11"  (1.499 m) 68.5 kg 30.48     Protocol Documents Imaging Protocol        Nuclear Stress Findings Isotope administration Rest isotope was administered with an IV injection of 14.38 mCi technetium tetrofosmin. Rest SPECT images were obtained approximately 45 minutes post tracer injection. Stress isotope  was administered with an IV injection of 29.36 mCi technetium tetrofosmin   Nuclear Study Quality Overall image quality is excellent.There is no nuclear artifact present.   Nuclear Measurements Study was gated.   Rest Perfusion Rest perfusion normal.   Stress Perfusion Stress perfusion normal.   Overall Study Impression Myocardial perfusion is normal. The study is normal. This is a low risk study. Overall left ventricular systolic function was normal. LV cavity size is normal. The left ventricular ejection fraction is normal (55-65%).   From: ACCF/SCAI/STS/AATS/AHA/ASNC/HFSA/SCCT 2012 Appropriate Use Criteria for Coronary Revascularization Focused Update        Nuclear Stress Measurements LV sys vol 6 mL     TID 1      LV dias vol 36 mL     SSS 0      SRS 3      SDS 0         Stress Measurements Baseline Vitals   Rest HR 81 bpm   Rest BP 131/71 mmHg   Peak Stress Vitals    Peak HR 113 bpm    Peak BP 138/69 mmHg    Exercise Data     Percent HR 77 %        Stress Findings ECG Baseline ECG exhibits normal sinus rhythm..   Stress Findings A pharmacological stress test was performed using IV Lexiscan 0.4mg  over 10 seconds performed without  concurrent submaximal exercise.  The patient reported no symptoms during the stress test.   Response to Stress There was no ST segment deviation noted during stress.  No T wave inversion was noted during stress. Arrhythmias during stress: rare PVCs.  Arrhythmias during recovery: none.  Arrhythmias were not significant.  ECG was interpretable and there was no significant change from baseline.     Wall Scoring Score Index: 1.000 Percent Normal: 100.0%                   The left ventricular wall motion is normal.                  Imaging Imaging Information        Resulted by: Signed Date/Time   Phone Pager  Kathlyn Sacramento A 08/17/2017 2:37 PM [phone]               Result Notes for NM Myocar Multi W/Spect  W/Wall Motion / EF Notes recorded by Georgiana Shore, RN on 08/20/2017 at 8:33 AM EST  Left detailed message w/results and call back number if questions on pt's cell VM  ------   Notes recorded by Wellington Hampshire, MD on 08/17/2017 at 3:35 PM EST  Inform patient that stress test was normal.                     Imaging Related Medications Medication technetium tetrofosmin (TC-MYOVIEW) injection 29.56 millicurie (Completed)  Route: Intravenous  Admin Amount: 21.30 millicurie  PRN Reason(s): radiopharmaceutical  Last Admin Time: 08/17/17 0715  Number of Expected Doses: 1  Most Recent Administration:  User Action Time Recorded Time Dose Route Site Comment Action Reason  Elaina Pattee, RT 08/17/17 0715 86/57/84 6962 95.28 millicurie Intravenous   Contrast Given   Full Administration Report          Medication technetium tetrofosmin (TC-MYOVIEW) injection 41.32 millicurie (Completed)  Route: Intravenous  Admin Amount: 44.01 millicurie  PRN Reason(s): radiopharmaceutical  Last Admin Time: 08/17/17 0272  Number of Expected Doses: 1  Most Recent Administration:  User Action Time Recorded Time Dose Route Site Comment Action Reason  Kingsley Callander 08/17/17 5366 44/03/47 4259 56.38 millicurie Intravenous   Contrast Given   Full Administration Report             Original Order Ordered On Ordered By    08/13/2017 2:38 PM Georgiana Shore, RN                  External Result Report External Result Report

## 2019-05-12 NOTE — Patient Instructions (Addendum)
Your procedure is scheduled on: 05-15-19 THURSDAY Report to Same Day Surgery 2nd floor medical mall [facility] Entrance-take elevator on left to 2nd floor.  Check in with surgery information desk.) To find out your arrival time please call [phone] between 1PM - 3PM on 05-14-19 [facility]  Remember: Instructions that are not followed completely may result in serious medical risk, up to and including death, or upon the discretion of your surgeon and anesthesiologist your surgery may need to be rescheduled.    _x___ 1. Do not eat food after midnight the night before your procedure. NO GUM OR CANDY AFTER MIDNIGHT. You may drink clear liquids up to 2 hours before you are scheduled to arrive at the hospital for your procedure.  Do not drink clear liquids within 2 hours of your scheduled arrival to the hospital.  Clear liquids include  --Water or Apple juice without pulp  --Gatorade  --Black Coffee or Clear Tea (No milk, no creamers, do not add anything to the coffee or Tea   ____Ensure clear carbohydrate drink on the way to the hospital for bariatric patients  ____Ensure clear carbohydrate drink 3 hours before surgery.    __x__ 2. No Alcohol for 24 hours before or after surgery.   __x__3. No Smoking or e-cigarettes for 24 prior to surgery.  Do not use any chewable tobacco products for at least 6 hour prior to surgery   ____  4. Bring all medications with you on the day of surgery if instructed.    __x__ 5. Notify your doctor if there is any change in your medical condition     (cold, fever, infections).    x___6. On the morning of surgery brush your teeth with toothpaste and water.  You may rinse your mouth with mouth wash if you wish.  Do not swallow any toothpaste or mouthwash.   Do not wear jewelry, make-up, hairpins, clips or nail polish.  Do not wear lotions, powders, or perfumes. You may wear deodorant.  Do not shave 48 hours prior to surgery. Men may shave face and neck.  Do  not bring valuables to the hospital.    [facility] is not responsible for any belongings or valuables.               Contacts, dentures or bridgework may not be worn into surgery.  Leave your suitcase in the car. After surgery it may be brought to your room.  For patients admitted to the hospital, discharge time is determined by your treatment team.  _  Patients discharged the day of surgery will not be allowed to drive home.  You will need someone to drive you home and stay with you the night of your procedure.    Please read over the following fact sheets that you were given:   [facility] Preparing for Surgery and or MRSA Information   _x___ TAKE THE FOLLOWING MEDICATION THE MORNING OF SURGERY WITH A SMALL SIP OF WATER. These include:  1. PRILOSEC (OMEPRAZOLE)  2. EFFEXOR (VENLAFAXINE)  3. YOU MAY TAKE HYDROCODONE DAY OF SURGERY IF NEEDED  4.  5.  6.  ____Fleets enema or Magnesium Citrate as directed.   ____ Use CHG Soap or sage wipes as directed on instruction sheet   ____ Use inhalers on the day of surgery and bring to hospital day of surgery  ____ Stop Metformin and Janumet 2 days prior to surgery.    ____ Take 1/2 of usual insulin dose the night before  surgery and none on the morning surgery.   _x___ Follow recommendations from Cardiologist, Pulmonologist or PCP regarding stopping Aspirin, Coumadin, Plavix ,Eliquis, Effient, or Pradaxa, and Pletal-ASPIRIN WAS STOPPED LAST Thursday PER SURGEONS OFFICE INSTRUCTIONS  X____Stop Anti-inflammatories such as Advil, Aleve, Ibuprofen, Motrin, Naproxen,MELOXICAM (MOBIC) Naprosyn, Goodies powders or aspirin products NOW- OK to take Tylenol OR HYDROCODONE IF NEEDED   ____ Stop supplements until after surgery.     ____ Bring C-Pap to the hospital.

## 2019-05-14 MED ORDER — CEFAZOLIN SODIUM-DEXTROSE 2-4 GM/100ML-% IV SOLN
2.0000 g | Freq: Once | INTRAVENOUS | Status: AC
  Administered 2019-05-15: 2 g via INTRAVENOUS

## 2019-05-15 ENCOUNTER — Ambulatory Visit: Payer: Medicare HMO | Admitting: Certified Registered Nurse Anesthetist

## 2019-05-15 ENCOUNTER — Ambulatory Visit
Admission: RE | Admit: 2019-05-15 | Discharge: 2019-05-15 | Disposition: A | Discharge status: Home / Self Care | Payer: Medicare HMO | Attending: Urology | Admitting: Urology

## 2019-05-15 ENCOUNTER — Other Ambulatory Visit: Payer: Self-pay

## 2019-05-15 ENCOUNTER — Encounter
Admission: RE | Disposition: A | Discharge status: Home / Self Care | Payer: Self-pay | Source: Home / Self Care | Attending: Urology

## 2019-05-15 ENCOUNTER — Encounter: Payer: Self-pay | Admitting: Certified Registered Nurse Anesthetist

## 2019-05-15 DIAGNOSIS — E785 Hyperlipidemia, unspecified: Secondary | ICD-10-CM | POA: Insufficient documentation

## 2019-05-15 DIAGNOSIS — R351 Nocturia: Secondary | ICD-10-CM | POA: Insufficient documentation

## 2019-05-15 DIAGNOSIS — N3946 Mixed incontinence: Secondary | ICD-10-CM | POA: Insufficient documentation

## 2019-05-15 DIAGNOSIS — Z841 Family history of disorders of kidney and ureter: Secondary | ICD-10-CM | POA: Diagnosis not present

## 2019-05-15 DIAGNOSIS — Z7982 Long term (current) use of aspirin: Secondary | ICD-10-CM | POA: Insufficient documentation

## 2019-05-15 DIAGNOSIS — Z9841 Cataract extraction status, right eye: Secondary | ICD-10-CM | POA: Diagnosis not present

## 2019-05-15 DIAGNOSIS — Z791 Long term (current) use of non-steroidal anti-inflammatories (NSAID): Secondary | ICD-10-CM | POA: Diagnosis not present

## 2019-05-15 DIAGNOSIS — Z79899 Other long term (current) drug therapy: Secondary | ICD-10-CM | POA: Diagnosis not present

## 2019-05-15 DIAGNOSIS — F329 Major depressive disorder, single episode, unspecified: Secondary | ICD-10-CM | POA: Insufficient documentation

## 2019-05-15 DIAGNOSIS — G2581 Restless legs syndrome: Secondary | ICD-10-CM | POA: Diagnosis not present

## 2019-05-15 DIAGNOSIS — F419 Anxiety disorder, unspecified: Secondary | ICD-10-CM | POA: Insufficient documentation

## 2019-05-15 DIAGNOSIS — E7800 Pure hypercholesterolemia, unspecified: Secondary | ICD-10-CM | POA: Insufficient documentation

## 2019-05-15 DIAGNOSIS — E669 Obesity, unspecified: Secondary | ICD-10-CM | POA: Diagnosis not present

## 2019-05-15 DIAGNOSIS — Z87891 Personal history of nicotine dependence: Secondary | ICD-10-CM | POA: Diagnosis not present

## 2019-05-15 DIAGNOSIS — R42 Dizziness and giddiness: Secondary | ICD-10-CM | POA: Diagnosis not present

## 2019-05-15 DIAGNOSIS — Z6831 Body mass index (BMI) 31.0-31.9, adult: Secondary | ICD-10-CM | POA: Diagnosis not present

## 2019-05-15 DIAGNOSIS — Z8673 Personal history of transient ischemic attack (TIA), and cerebral infarction without residual deficits: Secondary | ICD-10-CM | POA: Diagnosis not present

## 2019-05-15 DIAGNOSIS — Z9842 Cataract extraction status, left eye: Secondary | ICD-10-CM | POA: Diagnosis not present

## 2019-05-15 DIAGNOSIS — M1990 Unspecified osteoarthritis, unspecified site: Secondary | ICD-10-CM | POA: Insufficient documentation

## 2019-05-15 DIAGNOSIS — I10 Essential (primary) hypertension: Secondary | ICD-10-CM | POA: Insufficient documentation

## 2019-05-15 DIAGNOSIS — R32 Unspecified urinary incontinence: Secondary | ICD-10-CM | POA: Diagnosis not present

## 2019-05-15 DIAGNOSIS — K219 Gastro-esophageal reflux disease without esophagitis: Secondary | ICD-10-CM | POA: Diagnosis not present

## 2019-05-15 DIAGNOSIS — R3981 Functional urinary incontinence: Secondary | ICD-10-CM | POA: Diagnosis not present

## 2019-05-15 DIAGNOSIS — N3090 Cystitis, unspecified without hematuria: Secondary | ICD-10-CM | POA: Diagnosis not present

## 2019-05-15 HISTORY — PX: CYSTOSCOPY MACROPLASTIQUE IMPLANT: SHX6636

## 2019-05-15 SURGERY — CYSTOSCOPY, WITH MACROPLASTIQUE INJECTION
Anesthesia: General | Site: Bladder

## 2019-05-15 MED ORDER — DEXAMETHASONE SODIUM PHOSPHATE 10 MG/ML IJ SOLN
INTRAMUSCULAR | Status: DC | PRN
  Administered 2019-05-15: 10 mg via INTRAVENOUS

## 2019-05-15 MED ORDER — FENTANYL CITRATE (PF) 100 MCG/2ML IJ SOLN
INTRAMUSCULAR | Status: DC | PRN
  Administered 2019-05-15: 25 ug via INTRAVENOUS
  Administered 2019-05-15: 50 ug via INTRAVENOUS

## 2019-05-15 MED ORDER — LIDOCAINE HCL (CARDIAC) PF 100 MG/5ML IV SOSY
PREFILLED_SYRINGE | INTRAVENOUS | Status: DC | PRN
  Administered 2019-05-15: 80 mg via INTRAVENOUS

## 2019-05-15 MED ORDER — SUCCINYLCHOLINE CHLORIDE 20 MG/ML IJ SOLN
INTRAMUSCULAR | Status: DC | PRN
  Administered 2019-05-15: 100 mg via INTRAVENOUS

## 2019-05-15 MED ORDER — IPRATROPIUM-ALBUTEROL 0.5-2.5 (3) MG/3ML IN SOLN
RESPIRATORY_TRACT | Status: AC
  Filled 2019-05-15: qty 3

## 2019-05-15 MED ORDER — ONDANSETRON HCL 4 MG/2ML IJ SOLN: 4.0000 mg | Freq: Once | INTRAMUSCULAR | Status: DC | PRN

## 2019-05-15 MED ORDER — IPRATROPIUM-ALBUTEROL 0.5-2.5 (3) MG/3ML IN SOLN
3.0000 mL | Freq: Once | RESPIRATORY_TRACT | Status: AC
  Administered 2019-05-15: 3 mL via RESPIRATORY_TRACT

## 2019-05-15 MED ORDER — NITROFURANTOIN MACROCRYSTAL 100 MG PO CAPS: 100.0000 mg | ORAL_CAPSULE | Freq: Two times a day (BID) | ORAL | 1 refills | Status: DC

## 2019-05-15 MED ORDER — PHENYLEPHRINE HCL (PRESSORS) 10 MG/ML IV SOLN
INTRAVENOUS | Status: AC
  Filled 2019-05-15: qty 1

## 2019-05-15 MED ORDER — ONDANSETRON HCL 4 MG/2ML IJ SOLN
INTRAMUSCULAR | Status: DC | PRN
  Administered 2019-05-15: 4 mg via INTRAVENOUS

## 2019-05-15 MED ORDER — LIDOCAINE HCL URETHRAL/MUCOSAL 2 % EX GEL
CUTANEOUS | Status: AC
  Filled 2019-05-15: qty 10

## 2019-05-15 MED ORDER — BELLADONNA ALKALOIDS-OPIUM 16.2-60 MG RE SUPP
RECTAL | Status: AC
  Filled 2019-05-15: qty 1

## 2019-05-15 MED ORDER — FENTANYL CITRATE (PF) 100 MCG/2ML IJ SOLN: 25.0000 ug | INTRAMUSCULAR | Status: DC | PRN

## 2019-05-15 MED ORDER — CEFAZOLIN SODIUM-DEXTROSE 2-4 GM/100ML-% IV SOLN
INTRAVENOUS | Status: AC
  Filled 2019-05-15: qty 100

## 2019-05-15 MED ORDER — PROPOFOL 10 MG/ML IV BOLUS
INTRAVENOUS | Status: AC
  Filled 2019-05-15: qty 20

## 2019-05-15 MED ORDER — MIDAZOLAM HCL 2 MG/2ML IJ SOLN
INTRAMUSCULAR | Status: DC | PRN
  Administered 2019-05-15: 2 mg via INTRAVENOUS

## 2019-05-15 MED ORDER — EPHEDRINE SULFATE 50 MG/ML IJ SOLN
INTRAMUSCULAR | Status: AC
  Filled 2019-05-15: qty 1

## 2019-05-15 MED ORDER — LIDOCAINE HCL URETHRAL/MUCOSAL 2 % EX GEL
CUTANEOUS | Status: DC | PRN
  Administered 2019-05-15: 1 via URETHRAL

## 2019-05-15 MED ORDER — LIDOCAINE HCL (PF) 2 % IJ SOLN
INTRAMUSCULAR | Status: AC
  Filled 2019-05-15: qty 10

## 2019-05-15 MED ORDER — BELLADONNA ALKALOIDS-OPIUM 16.2-60 MG RE SUPP
RECTAL | Status: DC | PRN
  Administered 2019-05-15: 1 via RECTAL

## 2019-05-15 MED ORDER — SUCCINYLCHOLINE CHLORIDE 20 MG/ML IJ SOLN
INTRAMUSCULAR | Status: AC
  Filled 2019-05-15: qty 1

## 2019-05-15 MED ORDER — URIBEL 118 MG PO CAPS: 1.0000 | ORAL_CAPSULE | Freq: Four times a day (QID) | ORAL | 3 refills | Status: DC | PRN

## 2019-05-15 MED ORDER — MIDAZOLAM HCL 2 MG/2ML IJ SOLN
INTRAMUSCULAR | Status: AC
  Filled 2019-05-15: qty 2

## 2019-05-15 MED ORDER — SEVOFLURANE IN SOLN
RESPIRATORY_TRACT | Status: AC
  Filled 2019-05-15: qty 250

## 2019-05-15 MED ORDER — DEXAMETHASONE SODIUM PHOSPHATE 10 MG/ML IJ SOLN
INTRAMUSCULAR | Status: AC
  Filled 2019-05-15: qty 1

## 2019-05-15 MED ORDER — LACTATED RINGERS IV SOLN
INTRAVENOUS | Status: DC
  Administered 2019-05-15: 07:00:00 via INTRAVENOUS

## 2019-05-15 MED ORDER — PROPOFOL 10 MG/ML IV BOLUS
INTRAVENOUS | Status: DC | PRN
  Administered 2019-05-15: 140 mg via INTRAVENOUS

## 2019-05-15 MED ORDER — FENTANYL CITRATE (PF) 100 MCG/2ML IJ SOLN
INTRAMUSCULAR | Status: AC
  Filled 2019-05-15: qty 2

## 2019-05-15 MED ORDER — ONDANSETRON HCL 4 MG/2ML IJ SOLN
INTRAMUSCULAR | Status: AC
  Filled 2019-05-15: qty 2

## 2019-05-15 SURGICAL SUPPLY — 19 items
BAG DRAIN CYSTO-URO LG1000N (MISCELLANEOUS) ×3 IMPLANT
CATH ROBINSON RED A/P 12FR (CATHETERS) ×3 IMPLANT
ELECT REM PT RETURN 9FT ADLT (ELECTROSURGICAL) ×3
ELECTRODE REM PT RTRN 9FT ADLT (ELECTROSURGICAL) ×2 IMPLANT
GLOVE BIO SURGEON STRL SZ7 (GLOVE) ×6 IMPLANT
GLOVE BIO SURGEON STRL SZ7.5 (GLOVE) ×3 IMPLANT
GOWN STRL REUS W/ TWL LRG LVL3 (GOWN DISPOSABLE) ×2 IMPLANT
GOWN STRL REUS W/ TWL XL LVL3 (GOWN DISPOSABLE) ×2 IMPLANT
GOWN STRL REUS W/TWL LRG LVL3 (GOWN DISPOSABLE) ×1
GOWN STRL REUS W/TWL XL LVL3 (GOWN DISPOSABLE) ×1
INJECTION MACROPLASIQ 2.5ML UN (Female Continence) ×12 IMPLANT
KIT TURNOVER CYSTO (KITS) ×3 IMPLANT
MACROPLASTIQUE 2.5ML UNIT (Female Continence) ×6 IMPLANT
NDL ENDOSCOPIC URO 20G (NEEDLE) ×3 IMPLANT
PACK CYSTO AR (MISCELLANEOUS) ×3 IMPLANT
SET CYSTO W/LG BORE CLAMP LF (SET/KITS/TRAYS/PACK) ×3 IMPLANT
SURGILUBE 2OZ TUBE FLIPTOP (MISCELLANEOUS) ×3 IMPLANT
WATER STERILE IRR 1000ML POUR (IV SOLUTION) ×3 IMPLANT
WATER STERILE IRR 3000ML UROMA (IV SOLUTION) ×4 IMPLANT

## 2019-05-15 NOTE — Anesthesia Preprocedure Evaluation (Addendum)
Anesthesia Evaluation  Patient identified by MRN, date of birth, ID band Patient awake    Reviewed: Allergy & Precautions, NPO status , Patient's Chart, lab work & pertinent test results  History of Anesthesia Complications Negative for: history of anesthetic complications  Airway Mallampati: II  TM Distance: >3 FB Neck ROM: Full    Dental no notable dental hx.    Pulmonary neg sleep apnea, neg COPD, former smoker,    breath sounds clear to auscultation- rhonchi (-) wheezing      Cardiovascular hypertension, Pt. on medications (-) CAD, (-) Past MI and (-) Cardiac Stents  Rhythm:Regular Rate:Normal - Systolic murmurs and - Diastolic murmurs    Neuro/Psych PSYCHIATRIC DISORDERS Anxiety Depression CVA, No Residual Symptoms    GI/Hepatic Neg liver ROS, GERD  Controlled and Medicated,  Endo/Other  negative endocrine ROSneg diabetes  Renal/GU negative Renal ROS     Musculoskeletal  (+) Arthritis ,   Abdominal (+) + obese,   Peds  Hematology negative hematology ROS (+)   Anesthesia Other Findings Past Medical History: No date: Anxiety No date: Arthritis No date: Chicken pox No date: Depression No date: GERD (gastroesophageal reflux disease) No date: History of vertigo No date: Hyperlipidemia No date: Hypertension   Reproductive/Obstetrics                             Anesthesia Physical  Anesthesia Plan  ASA: III  Anesthesia Plan: General   Post-op Pain Management:    Induction: Intravenous  PONV Risk Score and Plan: 2 and Ondansetron and Dexamethasone  Airway Management Planned: LMA  Additional Equipment:   Intra-op Plan:   Post-operative Plan:   Informed Consent: I have reviewed the patients History and Physical, chart, labs and discussed the procedure including the risks, benefits and alternatives for the proposed anesthesia with the patient or authorized representative  who has indicated his/her understanding and acceptance.     Dental advisory given  Plan Discussed with: CRNA and Anesthesiologist  Anesthesia Plan Comments:         Anesthesia Quick Evaluation

## 2019-05-15 NOTE — Op Note (Signed)
Preoperative diagnosis: Urinary incontinence  Postoperative diagnosis: Same  Procedure: Macroplastique injection with cystoscopy  Surgeon: Otelia Limes. Yves Dill MD  Anesthesia: General  Indications:See the history and physical. After informed consent the above procedure(s) were requested     Technique and findings: After adequate general anesthesia been obtained the patient was placed into dorsal lithotomy position and the perineum was prepped and draped in the usual fashion.  The 21 French cystoscope was coupled the camera and placed into the bladder.  The bladder was thoroughly inspected.  Both ureteral orifices were identified and had clear efflux.  No bladder mucosal tumors or lesions were identified.  The Macroplastique injection needle was placed through the scope and initial puncture performed at the 12 o'clock position.  1 cc was injected at this site.  Next injection placed at 3 o'clock position, followed by 6:00 and then finally 9 o'clock position.  Total of 4 cc was injected.  The conclusion of all 4 injections there was complete coaptation of the urethral mucosa circumferentially at the bladder neck.  The cystoscope was removed and a 10 French red Robinson catheter used to drain the bladder.  Ten cc of viscous Xylocaine was instilled within the urethra and a B&O suppository was placed.  Blood loss was less than 1 cc.  Procedure was then terminated and patient transferred to the recovery room in stable condition.

## 2019-05-15 NOTE — Anesthesia Procedure Notes (Signed)
Procedure Name: LMA Insertion Date/Time: 05/15/2019 7:36 AM Performed by: Johnna Acosta, CRNA Pre-anesthesia Checklist: Patient identified, Emergency Drugs available, Suction available, Patient being monitored and Timeout performed Patient Re-evaluated:Patient Re-evaluated prior to induction Oxygen Delivery Method: Circle system utilized Preoxygenation: Pre-oxygenation with 100% oxygen Induction Type: IV induction LMA: LMA inserted LMA Size: 4.5 Tube type: Oral Number of attempts: 1 Placement Confirmation: positive ETCO2 and breath sounds checked- equal and bilateral Tube secured with: Tape Dental Injury: Teeth and Oropharynx as per pre-operative assessment

## 2019-05-15 NOTE — H&P (Signed)
Date of Initial H&P: 05/08/19  History reviewed, patient examined, no change in status, stable for surgery. 

## 2019-05-15 NOTE — Anesthesia Post-op Follow-up Note (Signed)
Anesthesia QCDR form completed.        

## 2019-05-15 NOTE — Anesthesia Procedure Notes (Signed)
Procedure Name: Intubation Date/Time: 05/15/2019 7:57 AM Performed by: Johnna Acosta, CRNA Pre-anesthesia Checklist: Patient identified, Emergency Drugs available, Suction available, Timeout performed and Patient being monitored Patient Re-evaluated:Patient Re-evaluated prior to induction Oxygen Delivery Method: Circle system utilized Preoxygenation: Pre-oxygenation with 100% oxygen Induction Type: IV induction and Rapid sequence Laryngoscope Size: McGraph and 3 Grade View: Grade I Tube type: Oral Tube size: 7.0 mm Number of attempts: 1 Airway Equipment and Method: Stylet and Video-laryngoscopy Placement Confirmation: ETT inserted through vocal cords under direct vision,  positive ETCO2 and breath sounds checked- equal and bilateral Secured at: 20 cm Dental Injury: Teeth and Oropharynx as per pre-operative assessment  Difficulty Due To: Difficulty was anticipated, Difficult Airway- due to large tongue and Difficult Airway- due to reduced neck mobility

## 2019-05-15 NOTE — Transfer of Care (Signed)
Immediate Anesthesia Transfer of Care Note  Patient: Ashley [name]  Procedure(s) Performed: CYSTOSCOPY MACROPLASTIQUE IMPLANT (N/A Bladder)  Patient Location: PACU  Anesthesia Type:General  Level of Consciousness: drowsy  Airway & Oxygen Therapy: Patient Spontanous Breathing and Patient connected to face mask oxygen  Post-op Assessment: Report given to RN and Post -op Vital signs reviewed and stable  Post vital signs: Reviewed and stable  Last Vitals:  Vitals Value Taken Time  BP 162/102 05/15/19 0831  Temp 36.9 C 05/15/19 0828  Pulse 100 05/15/19 0833  Resp 16 05/15/19 0833  SpO2 100 % 05/15/19 0833  Vitals shown include unvalidated device data.  Last Pain:  Vitals:   05/15/19 0615  TempSrc: Tympanic  PainSc: 0-No pain         Complications: No apparent anesthesia complications

## 2019-05-15 NOTE — Anesthesia Postprocedure Evaluation (Signed)
Anesthesia Post Note  Patient: Ashley [name]  Procedure(s) Performed: CYSTOSCOPY MACROPLASTIQUE IMPLANT (N/A Bladder)  Patient location during evaluation: PACU Anesthesia Type: General Level of consciousness: awake and alert and oriented Pain management: pain level controlled Vital Signs Assessment: post-procedure vital signs reviewed and stable Respiratory status: spontaneous breathing Cardiovascular status: blood pressure returned to baseline Anesthetic complications: no     Last Vitals:  Vitals:   05/15/19 0932 05/15/19 0956  BP: (P) 135/76 136/78  Pulse:  80  Resp: (P) 18 18  Temp:    SpO2: (P) 95% 95%    Last Pain:  Vitals:   05/15/19 0956  TempSrc:   PainSc: 0-No pain                 CARROLL,PAUL

## 2019-05-15 NOTE — Discharge Instructions (Signed)
AMBULATORY SURGERY  DISCHARGE INSTRUCTIONS   The drugs that you were given will stay in your system until tomorrow so for the next 24 hours you should not:  Drive an automobile Make any legal decisions Drink any alcoholic beverage   You may resume regular meals tomorrow.  Today it is better to start with liquids and gradually work up to solid foods.  You may eat anything you prefer, but it is better to start with liquids, then soup and crackers, and gradually work up to solid foods.   Please notify your doctor immediately if you have any unusual bleeding, trouble breathing, redness and pain at the surgery site, drainage, fever, or pain not relieved by medication.  Your post-operative visit with Dr.                                     is: Date:                        Time:    Please call to schedule your post-operative visit.  Additional Instructions: Injection Treatments for Urinary Incontinence  Urinary incontinence is a condition in which a person cannot control when he or she passes urine. The muscle that normally keeps urine from leaking (urinary sphincter) may be weak. To treat this condition, a material (bulking agent) can be injected either into the area of the body that drains urine (urethra) or near where the bladder and urethra connect (bladder neck). This is called an implant. The implant narrows and strengthens the urethra to help control passage of urine. Urinary incontinence is a common problem for women who have had pregnancies or surgery such as a hysterectomy, and for men who have had prostate surgery. Tell a health care provider about: Any allergies you have. All medicines you are taking, including vitamins, herbs, eye drops, creams, and over-the-counter medicines. Any problems you or family members have had with anesthetic medicines. Any blood disorders you have. Any surgeries you have had. Any medical conditions you have. Whether you are pregnant or may be  pregnant. What are the risks? Generally, this is a safe procedure. However, problems may occur, including: Infection. Bleeding. Allergic reaction to medicines or to the bulking agent. Damage to the urethra or bladder. Difficulty passing urine. A strong and uncomfortable urge to pass urine (urgency). Pain when passing urine or having sex. Failure of the procedure to improve incontinence. A need to repeat the procedure at a later time. What happens before the procedure? Staying hydrated Follow instructions from your health care provider about hydration, which may include: Up to 2 hours before the procedure - you may continue to drink clear liquids, such as water, clear fruit juice, black coffee, and plain tea.  Eating and drinking restrictions Follow instructions from your health care provider about eating and drinking, which may include: 8 hours before the procedure - stop eating heavy meals or foods such as meat, fried foods, or fatty foods. 6 hours before the procedure - stop eating light meals or foods, such as toast or cereal. 6 hours before the procedure - stop drinking milk or drinks that contain milk. 2 hours before the procedure - stop drinking clear liquids. Medicines Ask your health care provider about: Changing or stopping your regular medicines. This is especially important if you are taking diabetes medicines or blood thinners. Taking medicines such as aspirin and ibuprofen. These  medicines can thin your blood. Do not take these medicines unless your health care provider tells you to take them. Taking over-the-counter medicines, vitamins, herbs, and supplements. General instructions Ask your health care provider what steps will be taken to help prevent infection. These may include: Removing hair at the surgery site. Washing skin with a germ-killing soap. Antibiotic medicine. You may need to have a series of tests done to learn more about your bladder control (urodynamic  testing). Plan to have someone take you home from the hospital or clinic. Plan to have a responsible adult care for you for at least 24 hours after you leave the hospital or clinic. This is important. What happens during the procedure?  An IV may be inserted into one of your veins. You may be given one or more of the following: A medicine to help you relax (sedative). A medicine to numb your urethra and bladder area (local anesthetic). A medicine to make you fall asleep (general anesthetic). A small, thin tube (catheter) may be inserted through your urethra into your bladder. A long, thin tube with a light and camera (cystoscope) will be placed into your urethra and moved up toward your bladder. The camera sends images to a screen in the room. These images will be used to help guide the procedure. A long needle will be threaded through the cystoscope. Bulking material will be injected into the tissues around your urethra, near your urinary sphincter. The cystoscope and needle will be removed. The procedure may vary among health care providers and hospitals. What happens after the procedure? Your blood pressure, heart rate, breathing rate, and blood oxygen level will be monitored until you leave the hospital or clinic. If you have a catheter in your urethra, it will be removed to see if you can pass urine. If you have trouble passing urine, a new catheter may be inserted and left in place for a few days. Your bladder may be filled with fluid through your catheter to check for any problems in the bladder. You may be given antibiotic medicine to take at home. Do not drive for 24 hours if you were given a sedative. Summary Urinary incontinence is when you cannot control when you pass urine. The muscle that normally keeps urine from leaking (urinary sphincter) may be weak. Urinary incontinence is a common problem for women who have had pregnancies or surgery such as a hysterectomy, and for men who  have had prostate surgery. To treat this condition, a material (bulking agent) can be injected either into the part of the body that drains urine from the bladder (urethra) or near where the bladder and urethra connect (bladder neck). This information is not intended to replace advice given to you by your health care provider. Make sure you discuss any questions you have with your health care provider. Document Released: 06/24/2014 Document Revised: 08/06/2017 Document Reviewed: 08/04/2017 Elsevier Patient Education  2020 [employer] Injection Treatments for Urinary Incontinence, Care After This sheet gives you information about how to care for yourself after your procedure. Your health care provider may also give you more specific instructions. If you have problems or questions, contact your health care provider. What can I expect after the procedure? After this procedure, it is common to have:  Trouble passing urine.  A small amount of blood in your urine.  A burning or stinging sensation when passing urine.  No improvement in your urinary incontinence for a few weeks. Follow these instructions at home: Catheter  care   If you go home with a urinary catheter in place, care for your catheter as told by your health care provider. This may include: ? Washing your hands before and after touching the catheter. ? Keeping the area around the catheter clean and dry. ? Making sure the catheter drainage bag is lower than your hips and bladder. This stops urine from going back into the tubing and into your bladder. ? Emptying the catheter drainage bag when it is full and monitoring the amount and color of your urine. ? Checking to make sure that there are no twists or bends (kinks) in the catheter tube. ? Visiting your health care provider to have the catheter removed. Medicines   Take over-the-counter and prescription medicines only as told by your health care provider.  Ifyou were  prescribed an antibiotic medicine, take it as told by your health care provider. Do not stop taking the antibiotic even if you start to feel better. General instructions   Return to your normal activities as told by your health care provider. Ask your health care provider what activities are safe for you.  Drink enough fluid to keep your urine pale yellow.  Do not take baths, swim, or use a hot tub if you have a urinary catheter in place.  Do not drive for 24 hours if you were given a medicine to help you relax (sedative) during your procedure.  Keep all follow-up visits as told by your health care provider. This is important. Contact a health care provider if you have:  Blood in your urine for longer than a few days.  Pain when passing urine that lasts for longer than a few days.  A strong and uncomfortable urge to pass urine (urgency).  Incontinence that does not improve after a few weeks. Get help right away if you:  Have a fever.  Cannot urinate.  Have cloudy or bad-smelling urine.  Have pain when passing urine, and the pain is getting worse.  Have a lot of blood in your urine. Summary  After this procedure, it is common to have trouble passing urine and to have a small amount of blood in the urine.  Your incontinence should improve in a few weeks.  If you go home with a urinary catheter in place, care for your catheter as told by your health care provider.  Return to your normal activities as told by your health care provider. This information is not intended to replace advice given to you by your health care provider. Make sure you discuss any questions you have with your health care provider. Document Released: 03/07/2004 Document Revised: 08/06/2017 Document Reviewed: 08/04/2017 Elsevier Patient Education  2020 [employer].

## 2019-05-16 ENCOUNTER — Encounter: Payer: Self-pay | Admitting: Urology

## 2019-05-23 DIAGNOSIS — N393 Stress incontinence (female) (male): Secondary | ICD-10-CM | POA: Diagnosis not present

## 2019-05-23 DIAGNOSIS — N3281 Overactive bladder: Secondary | ICD-10-CM | POA: Diagnosis not present

## 2019-05-26 ENCOUNTER — Other Ambulatory Visit: Payer: Self-pay | Admitting: Family Medicine

## 2019-06-10 ENCOUNTER — Telehealth: Payer: Self-pay

## 2019-06-10 NOTE — Telephone Encounter (Signed)
Copied from [place] [id number]. Topic: General - Other >> Jun 10, 2019  2:52 PM Carolyn Stare wrote: Pt req  call back about the below medication, pt only rec 15 pills on 05/27/2019 and if she is to take it 1 a day she need a regular 30 days supply  omeprazole (PRILOSEC) 20 MG capsule  [facility]

## 2019-06-11 ENCOUNTER — Other Ambulatory Visit: Payer: Self-pay | Admitting: Family Medicine

## 2019-06-12 NOTE — Telephone Encounter (Signed)
The pharmacy was called and the RX was changed from 15 tablets to 30 with 2 refills and the patient was called and informed of this change.  Nina,cma

## 2019-06-12 NOTE — Telephone Encounter (Signed)
The patient called about her omeprazole, pt only rec 15 pills on 05/27/2019 and if she is to take it 1 a day she need a regular 30 days supply. I did not know if there was a reason for the 15 pills or can I refill this for the patient.  Nina,cma   omeprazole (PRILOSEC) 20 MG capsule  [facility]

## 2019-06-12 NOTE — Telephone Encounter (Signed)
It appears [facility] Bon reordered this for 15 pills.  I am not sure why it was only sent in for 15 pills. Potentially the pharmacy requested it that way. I am fine with the patient receiving 30 pills with 2 refills. Please call the pharmacy and change this to reflect that number of pills and refills.

## 2019-06-17 ENCOUNTER — Other Ambulatory Visit: Payer: Self-pay | Admitting: Family Medicine

## 2019-06-24 DIAGNOSIS — J3081 Allergic rhinitis due to animal (cat) (dog) hair and dander: Secondary | ICD-10-CM | POA: Diagnosis not present

## 2019-06-24 DIAGNOSIS — T783XXD Angioneurotic edema, subsequent encounter: Secondary | ICD-10-CM | POA: Diagnosis not present

## 2019-06-24 DIAGNOSIS — J309 Allergic rhinitis, unspecified: Secondary | ICD-10-CM | POA: Diagnosis not present

## 2019-06-24 DIAGNOSIS — H1045 Other chronic allergic conjunctivitis: Secondary | ICD-10-CM | POA: Diagnosis not present

## 2019-06-25 DIAGNOSIS — G5603 Carpal tunnel syndrome, bilateral upper limbs: Secondary | ICD-10-CM | POA: Diagnosis not present

## 2019-06-26 DIAGNOSIS — Z01419 Encounter for gynecological examination (general) (routine) without abnormal findings: Secondary | ICD-10-CM | POA: Diagnosis not present

## 2019-06-26 DIAGNOSIS — Z1211 Encounter for screening for malignant neoplasm of colon: Secondary | ICD-10-CM | POA: Diagnosis not present

## 2019-06-26 DIAGNOSIS — Z124 Encounter for screening for malignant neoplasm of cervix: Secondary | ICD-10-CM | POA: Diagnosis not present

## 2019-06-26 DIAGNOSIS — E669 Obesity, unspecified: Secondary | ICD-10-CM | POA: Diagnosis not present

## 2019-06-30 ENCOUNTER — Other Ambulatory Visit: Payer: Self-pay | Admitting: Family Medicine

## 2019-07-17 DIAGNOSIS — Z1211 Encounter for screening for malignant neoplasm of colon: Secondary | ICD-10-CM | POA: Diagnosis not present

## 2019-07-29 DIAGNOSIS — Z1231 Encounter for screening mammogram for malignant neoplasm of breast: Secondary | ICD-10-CM | POA: Diagnosis not present

## 2019-07-29 DIAGNOSIS — Z9289 Personal history of other medical treatment: Secondary | ICD-10-CM | POA: Diagnosis not present

## 2019-07-30 DIAGNOSIS — G5603 Carpal tunnel syndrome, bilateral upper limbs: Secondary | ICD-10-CM | POA: Diagnosis not present

## 2019-07-30 HISTORY — PX: CARPAL TUNNEL RELEASE: SHX101

## 2019-08-11 ENCOUNTER — Other Ambulatory Visit: Payer: Self-pay | Admitting: Family Medicine

## 2019-08-18 DIAGNOSIS — H43813 Vitreous degeneration, bilateral: Secondary | ICD-10-CM | POA: Diagnosis not present

## 2019-09-05 ENCOUNTER — Other Ambulatory Visit: Payer: Self-pay | Admitting: Family Medicine

## 2019-09-08 ENCOUNTER — Ambulatory Visit: Payer: Medicare HMO | Admitting: Occupational Therapy

## 2019-09-15 ENCOUNTER — Encounter: Payer: Self-pay | Admitting: Occupational Therapy

## 2019-09-15 ENCOUNTER — Other Ambulatory Visit: Payer: Self-pay

## 2019-09-15 ENCOUNTER — Ambulatory Visit: Payer: Medicare HMO | Attending: Orthopedic Surgery | Admitting: Occupational Therapy

## 2019-09-15 DIAGNOSIS — M79641 Pain in right hand: Secondary | ICD-10-CM | POA: Diagnosis present

## 2019-09-15 DIAGNOSIS — R208 Other disturbances of skin sensation: Secondary | ICD-10-CM

## 2019-09-15 DIAGNOSIS — M6281 Muscle weakness (generalized): Secondary | ICD-10-CM | POA: Insufficient documentation

## 2019-09-15 DIAGNOSIS — R209 Unspecified disturbances of skin sensation: Secondary | ICD-10-CM | POA: Insufficient documentation

## 2019-09-15 DIAGNOSIS — M25641 Stiffness of right hand, not elsewhere classified: Secondary | ICD-10-CM | POA: Insufficient documentation

## 2019-09-15 NOTE — Patient Instructions (Signed)
2-3 x day - contrast  Scar massage -hand out provided  cica scar pad for night time under splint   Tendon glides - AROM only - no forcing 10 reps  Ulnar N glide 5 reps  Use splint night time  And daytime Benik neoprene splint for support and cushioning when on computer

## 2019-09-15 NOTE — Therapy (Signed)
[facility] [address], [place], [address] Phone: [phone]   Fax:  [phone]  Occupational Therapy Evaluation  Patient Details  Name: Ashley [name] MRN: [id number] Date of Birth: 03-18-1941 Referring Provider (OT): Rosita Kea   Encounter Date: 09/15/2019  OT End of Session - 09/15/19 2010    Visit Number  1    Number of Visits  8    Date for OT Re-Evaluation  10/13/19    OT Start Time  1434    OT Stop Time  1517    OT Time Calculation (min)  43 min    Activity Tolerance  Patient tolerated treatment well    Behavior During Therapy  [facility] for tasks assessed/performed       Past Medical History:  Diagnosis Date  . Abnormal uterine bleeding (AUB)    a. benign endometrial polyps on D+C in 03/2017  . Anxiety   . Arthritis   . Chicken pox   . Depression   . GERD (gastroesophageal reflux disease)   . History of stress test    a. 08/2017 Lexiscan MV: EF 55-65%. No ischemia/infarct. Low risk.  Marland Kitchen Hyperlipidemia   . Hypertension   . Lacunar infarction (HCC)    a. Dx in setting of dizziness/vertigo; b. 02/2018 Carotid U/S: bilateral intimal thickening and atherosclerotic plaque. No hemodynamically significant stenoses; c. 03/2018 MRI Brain: No mass or abnl enhancement. Chronic microvascular ischemic changes and mod volume loss of the brain. 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  . Stroke (HCC)    PT STATES SHE WAS TOLD THIS BY HER PCP BUT PT STATES SHE NEVER KNEW SHE HAD A STROKE-NO RESIDUAL EFFECTS  . Vertigo     Past Surgical History:  Procedure Laterality Date  . CARPAL TUNNEL RELEASE Right 07/30/2019  . CYSTOSCOPY MACROPLASTIQUE IMPLANT N/A 05/15/2019   Procedure: CYSTOSCOPY MACROPLASTIQUE IMPLANT;  Surgeon: Orson Ape, MD;  Location: [facility] ORS;  Service: Urology;  Laterality: N/A;  . DILATATION & CURETTAGE/HYSTEROSCOPY WITH MYOSURE N/A 03/30/2017   Procedure: DILATATION &  CURETTAGE/HYSTEROSCOPY WITH MYOSURE;  Surgeon: Schermerhorn, Ihor Austin, MD;  Location: [facility] ORS;  Service: Gynecology;  Laterality: N/A;  . EYE SURGERY Bilateral    Cataract Extraction with IOL  . SKIN LESION EXCISION    . TONSILLECTOMY AND ADENOIDECTOMY     Age 79    There were no vitals filed for this visit.  Subjective Assessment - 09/15/19 1959    Subjective   I  had the symptoms probably for few years , cut not it feels worse after the surgery than before - more numb and these shooting pain. I was suppose to get the L hand done - but this one needs to get better first    Pertinent History  Pt had CTR on R hand 07/30/2019 - cont to have numbness and pain - had prednisone 08/20/2019 but no improvement - and then shot from Dr Landry Mellow 09/04/2019 - but no improvement - refer to hand therapy    Patient Stated Goals  I want to numbness and pain better in my R hand so I can use it and get my L hand done    Currently in Pain?  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 [employer]    Leisure  On computer about 3 hrs day , house work , cooking ,       AROM   Right Wrist Extension  50 Degrees    Right Wrist Flexion  80 Degrees    Right Wrist Radial Deviation  18 Degrees    Right Wrist Ulnar Deviation  30 Degrees      Strength   Right Hand Grip (lbs)  20    Right Hand Lateral Pinch  7 lbs    Right Hand 3 Point Pinch  8 lbs    Left Hand Grip (lbs)  15    Left Hand  Lateral Pinch  5 lbs    Left Hand 3 Point Pinch  7 lbs      Right Hand AROM   R Thumb MCP 0-60  50 Degrees    R Thumb IP 0-80  50 Degrees    R Thumb Opposition to Index  --   opposition lacking 1 cm to 5th   R Index  MCP 0-90  75 Degrees    R Index PIP 0-100  90 Degrees    R Long  MCP 0-90  85 Degrees    R Long PIP 0-100  95 Degrees    R Ring  MCP 0-90  90 Degrees    R Ring PIP 0-100  95 Degrees    R Little  MCP 0-90  90 Degrees    R Little PIP 0-100  90 Degrees               OT Treatments/Exercises (OP) - 09/15/19 0001      RUE Contrast Bath   Time  9 minutes    Comments  prior to soft tissue and review of HEP      review HEP     2-3 x day - contrast  Scar massage -hand out provided  cica scar pad for night time under splint   Tendon glides - AROM only - no forcing 10 reps  Ulnar N glide 5 reps  Use splint night time  And daytime Benik neoprene splint for support and cushioning when on co      OT Education - 09/15/19 2009    Education Details  findings of eval and HEP    Person(s) Educated  Patient    Methods  Explanation;Demonstration;Tactile cues;Verbal cues;Handout    Comprehension  Verbal cues required;Returned demonstration;Verbalized understanding       OT Short Term Goals - 09/15/19 2017      OT SHORT TERM GOAL #1   Title  Pt to be independent in HEP to decrease scar tissue, decrease numbness and pain    Baseline  Pain on PRWHE 35/50 at eval , numbness on thumb thru 4th - 3rd and thumb the worse    Time  4    Period  Weeks  Status  New    Target Date  10/13/19        OT Long Term Goals - 09/15/19 2020      OT LONG TERM GOAL #1   Title  Numbness improve on Semmes Weinstein 2 levels  to be able to do buttons and picking up pills with more ease    Baseline  Semmes Weinstein 4.56 on DIP of 3rd and thumb , middle phalanges of 3rd and middle and prox thumb4.31 and 3.61 on 2nd    Time  6    Period  Weeks    Status  New    Target Date   10/27/19      OT LONG TERM GOAL #2   Title  R grip strength increase more than 5 lbs at least to cut food, carry more than 8 lbs    Baseline  R 20 lbs, L 15 - but has CTS in L    Time  6    Period  Weeks    Status  New    Target Date  10/27/19      OT LONG TERM GOAL #3   Title  Function score on PRWHE improve with more than 20 points    Baseline  at eval PRWHE function score 39/50    Time  6    Period  Weeks    Status  New    Target Date  10/27/19            Plan - 09/15/19 2011    Clinical Impression Statement  Pt is 6 1/2 wks s/p R CTR - pt present with continues numbness in thumb thru 4th digits with thumb and 3rd the worse , and shooting pain with use - pt show decrease wrist extention , opposition and decrease strength and prehension - limiting her functional use of R hand in ADL's and IADL's -on Semmex Weinstein pt show unable to feel  4.56 on DIP of 3rd and middle and DIP of thumb , 4.31 on 2nd digit and proximal phalanges of 3rd , and thumb - will reassess every week - ed pt on nerve healing    OT Occupational Profile and History  Problem Focused Assessment - Including review of records relating to presenting problem    Occupational performance deficits (Please refer to evaluation for details):  ADL's;IADL's;Work;Leisure;Play    Body Structure / Function / Physical Skills  ADL;Flexibility;UE functional use;ROM;Scar mobility;Pain;Strength;Sensation;Edema;IADL    Rehab Potential  Fair    Clinical Decision Making  Limited treatment options, no task modification necessary    Comorbidities Affecting Occupational Performance:  May have comorbidities impacting occupational performance   had prednisone and Shot since CTR done 07/30/2019   OT Frequency  2x / week    OT Duration  6 weeks    OT Treatment/Interventions  Self-care/ADL training;Therapeutic exercise;Contrast Bath;Paraffin;Manual Therapy;Passive range of motion;Scar mobilization;Splinting;Patient/family education    Plan   assess progress with HEP    OT Home Exercise Plan  see pt instruction    Consulted and Agree with Plan of Care  Patient       Patient will benefit from skilled therapeutic intervention in order to improve the following deficits and impairments:   Body Structure / Function / Physical Skills: ADL, Flexibility, UE functional use, ROM, Scar mobility, Pain, Strength, Sensation, Edema, IADL       Visit Diagnosis: Other disturbances of skin sensation - Plan: Ot plan of care cert/re-cert  Pain in right hand - Plan: Ot  plan of care cert/re-cert  Muscle weakness (generalized) - Plan: Ot plan of care cert/re-cert  Stiffness of right hand, not elsewhere classified - Plan: Ot plan of care cert/re-cert    Problem List Patient Active Problem List   Diagnosis Date Noted  . Carpal tunnel syndrome 05/07/2019  . Stress incontinence 05/07/2019  . Sinus congestion 05/03/2019  . Allergic reaction 03/14/2019  . Left knee pain 09/20/2018  . Hypertriglyceridemia 08/28/2018  . Partial tear of left rotator cuff 08/28/2018  . Plantar fasciitis of right foot 05/17/2018  . De Quervain's tenosynovitis, right 05/17/2018  . Bilateral knee pain 04/23/2018  . Left arm pain 03/18/2018  . Bilateral leg edema 10/11/2017  . Prediabetes 10/11/2017  . Chronic low back pain 10/11/2017  . Vertigo 10/11/2017  . Post-menopausal bleeding 07/09/2017  . Neck pain 07/09/2017  . Atypical chest pain 04/24/2017  . Jaw pain 04/24/2017  . Family history of ovarian cancer 01/16/2017  . Essential hypertension 01/16/2017  . GERD (gastroesophageal reflux disease) 01/16/2017  . Depression 12/28/2014  . Hyperlipidemia 12/28/2014    Rosalyn Gess OTR/L,CLT 09/15/2019, 8:29 PM  [facility] [address], [name], [address] Phone: [phone]   Fax:  [phone]  Name: Ashley [name] MRN: [id number] Date of Birth: [birth year]

## 2019-09-26 ENCOUNTER — Ambulatory Visit: Payer: Medicare HMO | Admitting: Occupational Therapy

## 2019-10-02 ENCOUNTER — Other Ambulatory Visit: Payer: Self-pay

## 2019-10-02 ENCOUNTER — Ambulatory Visit: Payer: Medicare HMO | Admitting: Occupational Therapy

## 2019-10-02 DIAGNOSIS — R209 Unspecified disturbances of skin sensation: Secondary | ICD-10-CM | POA: Diagnosis not present

## 2019-10-02 DIAGNOSIS — M79641 Pain in right hand: Secondary | ICD-10-CM

## 2019-10-02 DIAGNOSIS — M6281 Muscle weakness (generalized): Secondary | ICD-10-CM

## 2019-10-02 DIAGNOSIS — M25641 Stiffness of right hand, not elsewhere classified: Secondary | ICD-10-CM

## 2019-10-02 DIAGNOSIS — R208 Other disturbances of skin sensation: Secondary | ICD-10-CM

## 2019-10-02 NOTE — Therapy (Signed)
[facility] [address] [place], [address] Phone: [phone]   Fax:  [phone]  Occupational Therapy Treatment  Patient Details  Name: Ashley [name] MRN: [id number] Date of Birth: 03-Apr-1941 Referring Provider (OT): Rosita Kea   Encounter Date: 10/02/2019  OT End of Session - 10/02/19 1128    Visit Number  2    Number of Visits  8    Date for OT Re-Evaluation  10/13/19    OT Start Time  1100    OT Stop Time  1144    OT Time Calculation (min)  44 min    Activity Tolerance  Patient tolerated treatment well    Behavior During Therapy  [facility] for tasks assessed/performed       Past Medical History:  Diagnosis Date  . Abnormal uterine bleeding (AUB)    a. benign endometrial polyps on D+C in 03/2017  . Anxiety   . Arthritis   . Chicken pox   . Depression   . GERD (gastroesophageal reflux disease)   . History of stress test    a. 08/2017 Lexiscan MV: EF 55-65%. No ischemia/infarct. Low risk.  Marland Kitchen Hyperlipidemia   . Hypertension   . Lacunar infarction (HCC)    a. Dx in setting of dizziness/vertigo; b. 02/2018 Carotid U/S: bilateral intimal thickening and atherosclerotic plaque. No hemodynamically significant stenoses; c. 03/2018 MRI Brain: No mass or abnl enhancement. Chronic microvascular ischemic changes and mod volume loss of the brain. 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  . Stroke (HCC)    PT STATES SHE WAS TOLD THIS BY HER PCP BUT PT STATES SHE NEVER KNEW SHE HAD A STROKE-NO RESIDUAL EFFECTS  . Vertigo     Past Surgical History:  Procedure Laterality Date  . CARPAL TUNNEL RELEASE Right 07/30/2019  . CYSTOSCOPY MACROPLASTIQUE IMPLANT N/A 05/15/2019   Procedure: CYSTOSCOPY MACROPLASTIQUE IMPLANT;  Surgeon: Orson Ape, MD;  Location: [facility] ORS;  Service: Urology;  Laterality: N/A;  . DILATATION & CURETTAGE/HYSTEROSCOPY WITH MYOSURE N/A 03/30/2017   Procedure: DILATATION &  CURETTAGE/HYSTEROSCOPY WITH MYOSURE;  Surgeon: Schermerhorn, Ihor Austin, MD;  Location: [facility] ORS;  Service: Gynecology;  Laterality: N/A;  . EYE SURGERY Bilateral    Cataract Extraction with IOL  . SKIN LESION EXCISION    . TONSILLECTOMY AND ADENOIDECTOMY     Age 79    There were no vitals filed for this visit.  Subjective Assessment - 10/02/19 1125    Subjective   Maybe little better - no pain - mostly numbness    Pertinent History  Pt had CTR on R hand 07/30/2019 - cont to have numbness and pain - had prednisone 08/20/2019 but no improvement - and then shot from Dr Landry Mellow 09/04/2019 - but no improvement - refer to hand therapy    Patient Stated Goals  I want to numbness and pain better in my R hand so I can use it and get my L hand done    Currently in Pain?  No/denies         [facility] OT Assessment - 10/02/19 0001      AROM   Right Wrist Extension  55 Degrees      Strength   Right Hand Grip (lbs)  25    Right Hand Lateral Pinch  12 lbs    Right Hand 3 Point Pinch  9 lbs      Right Hand AROM   R Index  MCP  0-90  75 Degrees    R Index PIP 0-100  90 Degrees    R Long  MCP 0-90  85 Degrees    R Long PIP 0-100  100 Degrees    R Ring  MCP 0-90  90 Degrees    R Ring PIP 0-100  95 Degrees    R Little  MCP 0-90  90 Degrees    R Little PIP 0-100  90 Degrees      assess progress in AROM in R hand , and grip and prehension strength - see flow sheet  Semmes weinstein test done - see impression for assessment          OT Treatments/Exercises (OP) - 10/02/19 0001      RUE Contrast Bath   Time  9 minutes    Comments  prior to soft tissue and reviw of HEP       soft tissue mobs done - scar massage and carpal spreads , pt ed on scar massage and to focus on proximal part with some adhesions     pt to cont with 2-3 x day - contrast  Scar massage -hand out provided  cica scar pad for night time under splint  - new one provided   Tendon glides - AROM only - no forcing -dong  well  10 reps  add and review pt to do AAROM /PROM for composite wrist extention stretch 3 x 30 sec  Medial N glide 5 reps  Use splint night time  And daytime Benik neoprene splint for support and cushioning when on computer       OT Education - 10/02/19 1127    Education Details  progress and HEP    Person(s) Educated  Patient    Methods  Explanation;Demonstration;Tactile cues;Verbal cues;Handout    Comprehension  Verbal cues required;Returned demonstration;Verbalized understanding       OT Short Term Goals - 09/15/19 2017      OT SHORT TERM GOAL #1   Title  Pt to be independent in HEP to decrease scar tissue, decrease numbness and pain    Baseline  Pain on PRWHE 35/50 at eval , numbness on thumb thru 4th - 3rd and thumb the worse    Time  4    Period  Weeks    Status  New    Target Date  10/13/19        OT Long Term Goals - 09/15/19 2020      OT LONG TERM GOAL #1   Title  Numbness improve on Semmes Weinstein 2 levels  to be able to do buttons and picking up pills with more ease    Baseline  Semmes Weinstein 4.56 on DIP of 3rd and thumb , middle phalanges of 3rd and middle and prox thumb4.31 and 3.61 on 2nd    Time  6    Period  Weeks    Status  New    Target Date  10/27/19      OT LONG TERM GOAL #2   Title  R grip strength increase more than 5 lbs at least to cut food, carry more than 8 lbs    Baseline  R 20 lbs, L 15 - but has CTS in L    Time  6    Period  Weeks    Status  New    Target Date  10/27/19      OT LONG TERM GOAL #3   Title  Function score on PRWHE  improve with more than 20 points    Baseline  at eval PRWHE function score 39/50    Time  6    Period  Weeks    Status  New    Target Date  10/27/19            Plan - 10/02/19 1130    Clinical Impression Statement  Pt is about 9 wks s/p CTR - pt show improvement in numbness in 3rd and thumb - pt 3.61 on Semmes Weinstein in middle phalanges on thumb and prox/middle phalanges of 3rd - cont to  be 4.31 on 2nd digit and DIP of 3rd  , and 4.56 at thumb IP - show increase fisting and grip strength as well as lat grip    OT Occupational Profile and History  Problem Focused Assessment - Including review of records relating to presenting problem    Occupational performance deficits (Please refer to evaluation for details):  ADL's;IADL's;Work;Leisure;Play    Body Structure / Function / Physical Skills  ADL;Flexibility;UE functional use;ROM;Scar mobility;Pain;Strength;Sensation;Edema;IADL    Rehab Potential  Fair    Clinical Decision Making  Limited treatment options, no task modification necessary    Comorbidities Affecting Occupational Performance:  May have comorbidities impacting occupational performance    Modification or Assistance to Complete Evaluation   No modification of tasks or assist necessary to complete eval    OT Frequency  2x / week    OT Duration  4 weeks    OT Treatment/Interventions  Self-care/ADL training;Therapeutic exercise;Contrast Bath;Paraffin;Manual Therapy;Passive range of motion;Scar mobilization;Splinting;Patient/family education    Plan  assess progress with HEP    OT Home Exercise Plan  see pt instruction    Consulted and Agree with Plan of Care  Patient       Patient will benefit from skilled therapeutic intervention in order to improve the following deficits and impairments:   Body Structure / Function / Physical Skills: ADL, Flexibility, UE functional use, ROM, Scar mobility, Pain, Strength, Sensation, Edema, IADL       Visit Diagnosis: Other disturbances of skin sensation  Pain in right hand  Muscle weakness (generalized)  Stiffness of right hand, not elsewhere classified    Problem List Patient Active Problem List   Diagnosis Date Noted  . Carpal tunnel syndrome 05/07/2019  . Stress incontinence 05/07/2019  . Sinus congestion 05/03/2019  . Allergic reaction 03/14/2019  . Left knee pain 09/20/2018  . Hypertriglyceridemia 08/28/2018  .  Partial tear of left rotator cuff 08/28/2018  . Plantar fasciitis of right foot 05/17/2018  . De Quervain's tenosynovitis, right 05/17/2018  . Bilateral knee pain 04/23/2018  . Left arm pain 03/18/2018  . Bilateral leg edema 10/11/2017  . Prediabetes 10/11/2017  . Chronic low back pain 10/11/2017  . Vertigo 10/11/2017  . Post-menopausal bleeding 07/09/2017  . Neck pain 07/09/2017  . Atypical chest pain 04/24/2017  . Jaw pain 04/24/2017  . Family history of ovarian cancer 01/16/2017  . Essential hypertension 01/16/2017  . GERD (gastroesophageal reflux disease) 01/16/2017  . Depression 12/28/2014  . Hyperlipidemia 12/28/2014    Oletta Cohn OTR/L,CLT 10/02/2019, 12:40 PM  [facility] [address], [place], [address] Phone: [phone]   Fax:  [phone]  Name: Ashley [name] MRN: [id number] Date of Birth: 03-23-1941

## 2019-10-02 NOTE — Patient Instructions (Addendum)
Same focus on scar massage proximal part and wrist extention stretch

## 2019-10-08 ENCOUNTER — Other Ambulatory Visit: Payer: Self-pay | Admitting: Family Medicine

## 2019-10-14 ENCOUNTER — Other Ambulatory Visit: Payer: Self-pay | Admitting: Family Medicine

## 2019-10-14 DIAGNOSIS — G2581 Restless legs syndrome: Secondary | ICD-10-CM

## 2019-10-16 ENCOUNTER — Other Ambulatory Visit: Payer: Self-pay

## 2019-10-16 ENCOUNTER — Ambulatory Visit: Payer: Medicare HMO | Attending: Orthopedic Surgery | Admitting: Occupational Therapy

## 2019-10-16 DIAGNOSIS — M25641 Stiffness of right hand, not elsewhere classified: Secondary | ICD-10-CM | POA: Insufficient documentation

## 2019-10-16 DIAGNOSIS — M79641 Pain in right hand: Secondary | ICD-10-CM | POA: Insufficient documentation

## 2019-10-16 DIAGNOSIS — R209 Unspecified disturbances of skin sensation: Secondary | ICD-10-CM | POA: Insufficient documentation

## 2019-10-16 DIAGNOSIS — M6281 Muscle weakness (generalized): Secondary | ICD-10-CM | POA: Diagnosis present

## 2019-10-16 DIAGNOSIS — R208 Other disturbances of skin sensation: Secondary | ICD-10-CM

## 2019-10-16 NOTE — Therapy (Signed)
[facility] [address] [place], [address] Phone: [phone]   Fax:  [phone]  Occupational Therapy Treatment  Patient Details  Name: Ashley [name] MRN: [id number] Date of Birth: February 17, 1941 Referring Provider (OT): Rosita Kea   Encounter Date: 10/16/2019  OT End of Session - 10/16/19 1357    Visit Number  3    Number of Visits  8    Date for OT Re-Evaluation  10/13/19    OT Start Time  1330    OT Stop Time  1415    OT Time Calculation (min)  45 min    Activity Tolerance  Patient tolerated treatment well    Behavior During Therapy  [facility] for tasks assessed/performed       Past Medical History:  Diagnosis Date  . Abnormal uterine bleeding (AUB)    a. benign endometrial polyps on D+C in 03/2017  . Anxiety   . Arthritis   . Chicken pox   . Depression   . GERD (gastroesophageal reflux disease)   . History of stress test    a. 08/2017 Lexiscan MV: EF 55-65%. No ischemia/infarct. Low risk.  Marland Kitchen Hyperlipidemia   . Hypertension   . Lacunar infarction (HCC)    a. Dx in setting of dizziness/vertigo; b. 02/2018 Carotid U/S: bilateral intimal thickening and atherosclerotic plaque. No hemodynamically significant stenoses; c. 03/2018 MRI Brain: No mass or abnl enhancement. Chronic microvascular ischemic changes and mod volume loss of the brain. 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  . Stroke (HCC)    PT STATES SHE WAS TOLD THIS BY HER PCP BUT PT STATES SHE NEVER KNEW SHE HAD A STROKE-NO RESIDUAL EFFECTS  . Vertigo     Past Surgical History:  Procedure Laterality Date  . CARPAL TUNNEL RELEASE Right 07/30/2019  . CYSTOSCOPY MACROPLASTIQUE IMPLANT N/A 05/15/2019   Procedure: CYSTOSCOPY MACROPLASTIQUE IMPLANT;  Surgeon: Orson Ape, MD;  Location: [facility] ORS;  Service: Urology;  Laterality: N/A;  . DILATATION & CURETTAGE/HYSTEROSCOPY WITH MYOSURE N/A 03/30/2017   Procedure: DILATATION &  CURETTAGE/HYSTEROSCOPY WITH MYOSURE;  Surgeon: Schermerhorn, Ihor Austin, MD;  Location: [facility] ORS;  Service: Gynecology;  Laterality: N/A;  . EYE SURGERY Bilateral    Cataract Extraction with IOL  . SKIN LESION EXCISION    . TONSILLECTOMY AND ADENOIDECTOMY     Age 79    There were no vitals filed for this visit.  Subjective Assessment - 10/16/19 1355    Subjective   No pain - still numbness in the fingertips at thumb thru 3rd - but more feeling in bottom of fingers and palm -    Pertinent History  Pt had CTR on R hand 07/30/2019 - cont to have numbness and pain - had prednisone 08/20/2019 but no improvement - and then shot from Dr Landry Mellow 09/04/2019 - but no improvement - refer to hand therapy    Patient Stated Goals  I want to numbness and pain better in my R hand so I can use it and get my L hand done    Currently in Pain?  No/denies         [facility] [employer] OT Assessment - 10/16/19 0001      AROM   Right Wrist Extension  62 Degrees      Strength   Right Hand Grip (lbs)  29    Right Hand Lateral Pinch  12 lbs    Right Hand 3 Point Pinch  9 lbs  Left Hand Grip (lbs)  15    Left Hand Lateral Pinch  5 lbs    Left Hand 3 Point Pinch  7 lbs      Right Hand AROM   R Index  MCP 0-90  80 Degrees    R Index PIP 0-100  95 Degrees    R Long  MCP 0-90  85 Degrees    R Long PIP 0-100  100 Degrees    R Ring  MCP 0-90  90 Degrees    R Ring PIP 0-100  100 Degrees    R Little  MCP 0-90  90 Degrees    R Little PIP 0-100  95 Degrees          assess progress in AROM in R hand , and grip and prehension strength - see flow sheet  Semmes weinstein test done - see impression for assessment     progress in 2nd digit for sensation     OT Treatments/Exercises (OP) - 10/16/19 0001      RUE Contrast Bath   Time  9 minutes    Comments  prior to soft tissue        soft tissue mobs done - scar massage and carpal spreads , pt ed on scar massage and to focus on proximal part with some adhesions -  done this date some graston tool and Xtractor with fisting    pt to cont with 2-3 x day - contrast  Scar massage -hand out provided and review again  cica scar pad for night time under splint  - new one provided last time   Tendon glides - AROM only - no forcing -dong well  10 reps  cont AAROM /PROM for composite wrist extention stretch 3 x 30 sec Medial N glide 5 reps  Use splint night time  And daytime Benik neoprene splint for support and cushioning when on computer       OT Education - 10/16/19 1357    Education Details  progress and HEP    Person(s) Educated  Patient    Methods  Explanation;Demonstration;Tactile cues;Verbal cues;Handout    Comprehension  Verbal cues required;Returned demonstration;Verbalized understanding       OT Short Term Goals - 09/15/19 2017      OT SHORT TERM GOAL #1   Title  Pt to be independent in HEP to decrease scar tissue, decrease numbness and pain    Baseline  Pain on PRWHE 35/50 at eval , numbness on thumb thru 4th - 3rd and thumb the worse    Time  4    Period  Weeks    Status  New    Target Date  10/13/19        OT Long Term Goals - 09/15/19 2020      OT LONG TERM GOAL #1   Title  Numbness improve on Semmes Weinstein 2 levels  to be able to do buttons and picking up pills with more ease    Baseline  Semmes Weinstein 4.56 on DIP of 3rd and thumb , middle phalanges of 3rd and middle and prox thumb4.31 and 3.61 on 2nd    Time  6    Period  Weeks    Status  New    Target Date  10/27/19      OT LONG TERM GOAL #2   Title  R grip strength increase more than 5 lbs at least to cut food, carry more than 8 lbs  Baseline  R 20 lbs, L 15 - but has CTS in L    Time  6    Period  Weeks    Status  New    Target Date  10/27/19      OT LONG TERM GOAL #3   Title  Function score on PRWHE improve with more than 20 points    Baseline  at eval PRWHE function score 39/50    Time  6    Period  Weeks    Status  New    Target Date   10/27/19            Plan - 10/16/19 1357    Clinical Impression Statement  Pt is 11 wks s/p CTR - pt show increase sensation on Semmes Weistein test - thumb IP 4.56 but DIP of 2nd and 3rd 4.31 and 3.61 middle and proximal phalanges thumb and 2nd thru 3rd - increase diigits flexion and increaes grip strenght    OT Occupational Profile and History  Problem Focused Assessment - Including review of records relating to presenting problem    Occupational performance deficits (Please refer to evaluation for details):  ADL's;IADL's;Work;Leisure;Play    Body Structure / Function / Physical Skills  ADL;Flexibility;UE functional use;ROM;Scar mobility;Pain;Strength;Sensation;Edema;IADL    Rehab Potential  Fair    Clinical Decision Making  Limited treatment options, no task modification necessary    Comorbidities Affecting Occupational Performance:  May have comorbidities impacting occupational performance    Modification or Assistance to Complete Evaluation   No modification of tasks or assist necessary to complete eval    OT Frequency  Biweekly    OT Duration  2 weeks    OT Treatment/Interventions  Self-care/ADL training;Therapeutic exercise;Contrast Bath;Paraffin;Manual Therapy;Passive range of motion;Scar mobilization;Splinting;Patient/family education    Plan  assess progress with HEP    OT Home Exercise Plan  see pt instruction    Consulted and Agree with Plan of Care  Patient       Patient will benefit from skilled therapeutic intervention in order to improve the following deficits and impairments:   Body Structure / Function / Physical Skills: ADL, Flexibility, UE functional use, ROM, Scar mobility, Pain, Strength, Sensation, Edema, IADL       Visit Diagnosis: Other disturbances of skin sensation  Pain in right hand  Muscle weakness (generalized)  Stiffness of right hand, not elsewhere classified    Problem List Patient Active Problem List   Diagnosis Date Noted  . Carpal  tunnel syndrome 05/07/2019  . Stress incontinence 05/07/2019  . Sinus congestion 05/03/2019  . Allergic reaction 03/14/2019  . Left knee pain 09/20/2018  . Hypertriglyceridemia 08/28/2018  . Partial tear of left rotator cuff 08/28/2018  . Plantar fasciitis of right foot 05/17/2018  . De Quervain's tenosynovitis, right 05/17/2018  . Bilateral knee pain 04/23/2018  . Left arm pain 03/18/2018  . Bilateral leg edema 10/11/2017  . Prediabetes 10/11/2017  . Chronic low back pain 10/11/2017  . Vertigo 10/11/2017  . Post-menopausal bleeding 07/09/2017  . Neck pain 07/09/2017  . Atypical chest pain 04/24/2017  . Jaw pain 04/24/2017  . Family history of ovarian cancer 01/16/2017  . Essential hypertension 01/16/2017  . GERD (gastroesophageal reflux disease) 01/16/2017  . Depression 12/28/2014  . Hyperlipidemia 12/28/2014    Oletta Cohn  OTR/l,CLT 10/16/2019, 6:04 PM  [facility] [address], [place], [address] Phone: [phone]   Fax:  [phone]  Name: Ashley Mishkin  [name] MRN: [id number] Date of Birth: 1941/06/17

## 2019-10-16 NOTE — Patient Instructions (Signed)
Same

## 2019-10-27 ENCOUNTER — Other Ambulatory Visit: Payer: Self-pay

## 2019-10-27 ENCOUNTER — Ambulatory Visit: Payer: Medicare HMO | Admitting: Occupational Therapy

## 2019-10-27 DIAGNOSIS — M6281 Muscle weakness (generalized): Secondary | ICD-10-CM

## 2019-10-27 DIAGNOSIS — M79641 Pain in right hand: Secondary | ICD-10-CM

## 2019-10-27 DIAGNOSIS — M25641 Stiffness of right hand, not elsewhere classified: Secondary | ICD-10-CM

## 2019-10-27 DIAGNOSIS — R208 Other disturbances of skin sensation: Secondary | ICD-10-CM

## 2019-10-27 DIAGNOSIS — R209 Unspecified disturbances of skin sensation: Secondary | ICD-10-CM | POA: Diagnosis not present

## 2019-10-27 NOTE — Patient Instructions (Addendum)
Same but focus on scar massage to proximal scar

## 2019-10-27 NOTE — Therapy (Signed)
[facility] [address], [place], [address] Phone: [phone]   Fax:  [phone]  Occupational Therapy Treatment  Patient Details  Name: Ashley [name] MRN: [id number] Date of Birth: [birth year] Referring Provider (OT): Rosita Kea   Encounter Date: 10/27/2019  OT End of Session - 10/27/19 1306    Visit Number  4    Number of Visits  8    Date for OT Re-Evaluation  10/13/19    OT Start Time  1304    OT Stop Time  1338    OT Time Calculation (min)  34 min    Activity Tolerance  Patient tolerated treatment well    Behavior During Therapy  [facility] for tasks assessed/performed       Past Medical History:  Diagnosis Date  . Abnormal uterine bleeding (AUB)    a. benign endometrial polyps on D+C in 03/2017  . Anxiety   . Arthritis   . Chicken pox   . Depression   . GERD (gastroesophageal reflux disease)   . History of stress test    a. 08/2017 Lexiscan MV: EF 55-65%. No ischemia/infarct. Low risk.  Marland Kitchen Hyperlipidemia   . Hypertension   . Lacunar infarction (HCC)    a. Dx in setting of dizziness/vertigo; b. 02/2018 Carotid U/S: bilateral intimal thickening and atherosclerotic plaque. No hemodynamically significant stenoses; c. 03/2018 MRI Brain: No mass or abnl enhancement. Chronic microvascular ischemic changes and mod volume loss of the brain. 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  . Stroke (HCC)    PT STATES SHE WAS TOLD THIS BY HER PCP BUT PT STATES SHE NEVER KNEW SHE HAD A STROKE-NO RESIDUAL EFFECTS  . Vertigo     Past Surgical History:  Procedure Laterality Date  . CARPAL TUNNEL RELEASE Right 07/30/2019  . CYSTOSCOPY MACROPLASTIQUE IMPLANT N/A 05/15/2019   Procedure: CYSTOSCOPY MACROPLASTIQUE IMPLANT;  Surgeon: Orson Ape, MD;  Location: [facility] ORS;  Service: Urology;  Laterality: N/A;  . DILATATION & CURETTAGE/HYSTEROSCOPY WITH MYOSURE N/A 03/30/2017   Procedure: DILATATION &  CURETTAGE/HYSTEROSCOPY WITH MYOSURE;  Surgeon: Schermerhorn, Ihor Austin, MD;  Location: [facility] ORS;  Service: Gynecology;  Laterality: N/A;  . EYE SURGERY Bilateral    Cataract Extraction with IOL  . SKIN LESION EXCISION    . TONSILLECTOMY AND ADENOIDECTOMY     Age 79    There were no vitals filed for this visit.  Subjective Assessment - 10/27/19 1307    Subjective   No pain - numbness in thumb and finger tips- beel about the same -    Pertinent History  Pt had CTR on R hand 07/30/2019 - cont to have numbness and pain - had prednisone 08/20/2019 but no improvement - and then shot from Dr Landry Mellow 09/04/2019 - but no improvement - refer to hand therapy    Patient Stated Goals  I want to numbness and pain better in my R hand so I can use it and get my L hand done    Currently in Pain?  No/denies         [facility] OT Assessment - 10/27/19 0001      Strength   Right Hand Grip (lbs)  32    Right Hand Lateral Pinch  14 lbs    Right Hand 3 Point Pinch  11 lbs      grip and prehension strength increase in R hand - see flow sheet And Semmes-Weinstein done - progress in  all digits- see impression  Pt report no pain  Still having some trouble picking up something small and dropping objects          OT Treatments/Exercises (OP) - 10/27/19 0001      RUE Contrast Bath   Time  8 minutes    Comments  prior to scar massage        scar massage done on proximal scar - adhesions and thick - used xtractor with digits fisting and wrist extention - used mini massager  And Carpal spreads - done 10 reps  pt to focus on scar massage on proximal scar - cica scar pad night time  And cont tendon glides and Med N glide as before -        OT Education - 10/27/19 1306    Education Details  focus on scar massage - and same HEP    Person(s) Educated  Patient    Methods  Explanation;Demonstration;Tactile cues;Verbal cues;Handout    Comprehension  Verbal cues required;Returned demonstration;Verbalized  understanding       OT Short Term Goals - 10/27/19 1339      OT SHORT TERM GOAL #1   Title  Pt to be independent in HEP to decrease scar tissue, decrease numbness and pain    Baseline  Pain on PRWHE 35/50 at eval , numbness on thumb thru 4th - 3rd and thumb the worse - report no pain - and sensation improving    Time  2    Period  Weeks    Status  On-going    Target Date  11/10/19        OT Long Term Goals - 10/27/19 1340      OT LONG TERM GOAL #1   Title  Numbness improve on Semmes Weinstein 2 levels  to be able to do buttons and picking up pills with more ease    Baseline  Semmes Weinstein 4.56 on DIP of 3rd and thumb , middle phalanges of 3rd and middle and prox thumb4.31 and 3.61 on 2nd - see impression - improving all - normal sensation on middle to proximal on thumb and proximal of 2nd and 3rd ,    Time  2    Period  Weeks    Status  On-going    Target Date  10/27/19      OT LONG TERM GOAL #2   Title  R grip strength increase more than 5 lbs at least to cut food, carry more than 8 lbs    Baseline  R 32 lbs, L 15 - but has CTS in L    Status  Achieved            Plan - 10/27/19 1306    Clinical Impression Statement  Pt is 12 wks s/p R CTR - pt showed increase grip and prehension strength this date - sensation on Semmes weistein improve - thumb IP and 2nd/3rd DIP 4.31 , and then 3.61 on 3.61 on middle phalanges of 2nd and 3rd -and then normal sensation in thumb middle and proximal - and then prox of 2nd and 3rd  - pt to cont with HEP for month -and return for reassessment    OT Occupational Profile and History  Problem Focused Assessment - Including review of records relating to presenting problem    Occupational performance deficits (Please refer to evaluation for details):  ADL's;IADL's;Work;Leisure;Play    Body Structure / Function / Physical Skills  ADL;Flexibility;UE functional use;ROM;Scar mobility;Pain;Strength;Sensation;Edema;IADL  Rehab Potential  Fair     Clinical Decision Making  Limited treatment options, no task modification necessary    Comorbidities Affecting Occupational Performance:  May have comorbidities impacting occupational performance    Modification or Assistance to Complete Evaluation   No modification of tasks or assist necessary to complete eval    OT Frequency  Monthly    OT Duration  4 weeks    OT Treatment/Interventions  Self-care/ADL training;Therapeutic exercise;Contrast Bath;Paraffin;Manual Therapy;Passive range of motion;Scar mobilization;Splinting;Patient/family education    Plan  assess progress with HEP    OT Home Exercise Plan  see pt instruction    Consulted and Agree with Plan of Care  Patient       Patient will benefit from skilled therapeutic intervention in order to improve the following deficits and impairments:   Body Structure / Function / Physical Skills: ADL, Flexibility, UE functional use, ROM, Scar mobility, Pain, Strength, Sensation, Edema, IADL       Visit Diagnosis: Other disturbances of skin sensation  Stiffness of right hand, not elsewhere classified  Pain in right hand  Muscle weakness (generalized)    Problem List Patient Active Problem List   Diagnosis Date Noted  . Carpal tunnel syndrome 05/07/2019  . Stress incontinence 05/07/2019  . Sinus congestion 05/03/2019  . Allergic reaction 03/14/2019  . Left knee pain 09/20/2018  . Hypertriglyceridemia 08/28/2018  . Partial tear of left rotator cuff 08/28/2018  . Plantar fasciitis of right foot 05/17/2018  . De Quervain's tenosynovitis, right 05/17/2018  . Bilateral knee pain 04/23/2018  . Left arm pain 03/18/2018  . Bilateral leg edema 10/11/2017  . Prediabetes 10/11/2017  . Chronic low back pain 10/11/2017  . Vertigo 10/11/2017  . Post-menopausal bleeding 07/09/2017  . Neck pain 07/09/2017  . Atypical chest pain 04/24/2017  . Jaw pain 04/24/2017  . Family history of ovarian cancer 01/16/2017  . Essential hypertension  01/16/2017  . GERD (gastroesophageal reflux disease) 01/16/2017  . Depression 12/28/2014  . Hyperlipidemia 12/28/2014    Rosalyn Gess OTR/l,CLT 10/27/2019, 1:41 PM  [facility] [address], [name], [address] Phone: [phone]   Fax:  [phone]  Name: ARINE FOLEY MRN: [id number] Date of Birth: [birth year]

## 2019-11-21 ENCOUNTER — Encounter: Payer: Self-pay | Admitting: Family Medicine

## 2019-11-21 ENCOUNTER — Telehealth: Payer: Self-pay | Admitting: Family Medicine

## 2019-11-21 ENCOUNTER — Other Ambulatory Visit: Payer: Self-pay

## 2019-11-21 ENCOUNTER — Ambulatory Visit (INDEPENDENT_AMBULATORY_CARE_PROVIDER_SITE_OTHER): Payer: Medicare HMO | Admitting: Family Medicine

## 2019-11-21 VITALS — BP 124/76 | HR 92 | Temp 97.5°F | Ht 59.0 in | Wt 154.0 lb

## 2019-11-21 DIAGNOSIS — N3000 Acute cystitis without hematuria: Secondary | ICD-10-CM | POA: Insufficient documentation

## 2019-11-21 DIAGNOSIS — R32 Unspecified urinary incontinence: Secondary | ICD-10-CM

## 2019-11-21 LAB — POC URINALSYSI DIPSTICK (AUTOMATED)
Bilirubin, UA: NEGATIVE
Blood, UA: 100
Glucose, UA: NEGATIVE
Ketones, UA: NEGATIVE
Nitrite, UA: NEGATIVE
Protein, UA: POSITIVE — AB
Spec Grav, UA: 1.025 (ref 1.010–1.025)
Urobilinogen, UA: 0.2 E.U./dL
pH, UA: 6 (ref 5.0–8.0)

## 2019-11-21 MED ORDER — CEPHALEXIN 500 MG PO CAPS: 500.0000 mg | ORAL_CAPSULE | Freq: Two times a day (BID) | ORAL | 0 refills | Status: DC

## 2019-11-21 NOTE — Telephone Encounter (Signed)
Pt called in and wanted appt with Dr. Birdie Sons or someone else because she was having UTI symptoms. I got patient scheduled at [facility] with Dr. Milinda Antis today.

## 2019-11-21 NOTE — Progress Notes (Signed)
Subjective:    Patient ID: Ashley [name], female    DOB: 21-Mar-1941, 79 y.o.   MRN: [id number]  This visit occurred during the SARS-CoV-2 public health emergency.  Safety protocols were in place, including screening questions prior to the visit, additional usage of staff PPE, and extensive cleaning of exam room while observing appropriate contact time as indicated for disinfecting solutions.    HPI 79 yo pt of Dr Caryl Bis presents with urinary symptoms   She sees Dr Eliberto Ivory for urology  H/o frequent utis  No other urology problems besides stress incont  Had a procedure for that (gel) - macroplastique implant seen in surg hx  Last uti was 2 mo ago   This episode : Symptoms for about a week  Frequency/urgency and worse incontinence   No blood seen in urine  Urine smells a little stronger   Discomfort to urinate -not burning yet  No bladder pressure   No new back pain  No fever /but has had chills and fatigue   Drinks a lot of tea Not enough water   Results for orders placed or performed in visit on 11/21/19  POCT Urinalysis Dipstick (Automated)  Result Value Ref Range   Color, UA Yellow    Clarity, UA Hazy    Glucose, UA Negative Negative   Bilirubin, UA Negative    Ketones, UA Negative    Spec Grav, UA 1.025 1.010 - 1.025   Blood, UA 100 Ery/uL    pH, UA 6.0 5.0 - 8.0   Protein, UA Positive (A) Negative   Urobilinogen, UA 0.2 0.2 or 1.0 E.U./dL   Nitrite, UA Negative    Leukocytes, UA Small (1+) (A) Negative    Patient Active Problem List   Diagnosis Date Noted  . Acute cystitis 11/21/2019  . Carpal tunnel syndrome 05/07/2019  . Stress incontinence 05/07/2019  . Sinus congestion 05/03/2019  . Allergic reaction 03/14/2019  . Left knee pain 09/20/2018  . Hypertriglyceridemia 08/28/2018  . Partial tear of left rotator cuff 08/28/2018  . Plantar fasciitis of right foot 05/17/2018  . De Quervain's tenosynovitis, right 05/17/2018  . Bilateral knee pain  04/23/2018  . Left arm pain 03/18/2018  . Bilateral leg edema 10/11/2017  . Prediabetes 10/11/2017  . Chronic low back pain 10/11/2017  . Vertigo 10/11/2017  . Post-menopausal bleeding 07/09/2017  . Neck pain 07/09/2017  . Atypical chest pain 04/24/2017  . Jaw pain 04/24/2017  . Family history of ovarian cancer 01/16/2017  . Essential hypertension 01/16/2017  . GERD (gastroesophageal reflux disease) 01/16/2017  . Depression 12/28/2014  . Hyperlipidemia 12/28/2014   Past Medical History:  Diagnosis Date  . Abnormal uterine bleeding (AUB)    a. benign endometrial polyps on D+C in 03/2017  . Anxiety   . Arthritis   . Chicken pox   . Depression   . GERD (gastroesophageal reflux disease)   . History of stress test    a. 08/2017 Lexiscan MV: EF 55-65%. No ischemia/infarct. Low risk.  Marland Kitchen Hyperlipidemia   . Hypertension   . Lacunar infarction ([place])    a. Dx in setting of dizziness/vertigo; b. 02/2018 Carotid U/S: bilateral intimal thickening and atherosclerotic plaque. No hemodynamically significant stenoses; c. 03/2018 MRI Brain: No mass or abnl enhancement. Chronic microvascular ischemic changes and mod volume loss of the brain. 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  . Stroke ([place])    PT STATES SHE WAS TOLD THIS BY HER PCP BUT PT STATES  SHE NEVER KNEW SHE HAD A STROKE-NO RESIDUAL EFFECTS  . Vertigo    Past Surgical History:  Procedure Laterality Date  . CARPAL TUNNEL RELEASE Right 07/30/2019  . CYSTOSCOPY MACROPLASTIQUE IMPLANT N/A 05/15/2019   Procedure: CYSTOSCOPY MACROPLASTIQUE IMPLANT;  Surgeon: Orson Ape, MD;  Location: [facility] ORS;  Service: Urology;  Laterality: N/A;  . DILATATION & CURETTAGE/HYSTEROSCOPY WITH MYOSURE N/A 03/30/2017   Procedure: DILATATION & CURETTAGE/HYSTEROSCOPY WITH MYOSURE;  Surgeon: Schermerhorn, Ihor Austin, MD;  Location: [facility] ORS;  Service: Gynecology;  Laterality: N/A;  . EYE SURGERY Bilateral    Cataract Extraction with IOL  .  SKIN LESION EXCISION    . TONSILLECTOMY AND ADENOIDECTOMY     Age 68   Social History   Tobacco Use  . Smoking status: Former Smoker    Packs/day: 0.50    Years: 15.00    Pack years: 7.50    Types: Cigarettes    Quit date: 08/07/1976    Years since quitting: 43.3  . Smokeless tobacco: Never Used  Substance Use Topics  . Alcohol use: Yes    Comment: ocassionally  . Drug use: No   Family History  Problem Relation Age of Onset  . Rheum arthritis Mother   . Ovarian cancer Sister   . COPD Sister   . CAD Sister   . Heart disease Father   . Aneurysm Father   . Cancer Brother   . Thrombosis Maternal Grandmother        a. DVT  . Thrombosis Maternal Grandfather        a. DVT  . Thrombosis Paternal Grandmother        a. DVT  . Thrombosis Paternal Grandfather        a. DVT   Allergies  Allergen Reactions  . Ace Inhibitors     Angioedema  . Pravastatin Other (See Comments)    Other reaction(s): Muscle Pain  . Simvastatin Other (See Comments)    stiffness   Current Outpatient Medications on File Prior to Visit  Medication Sig Dispense Refill  . acetaminophen (TYLENOL) 650 MG CR tablet Take 1,300 mg by mouth daily.    Marland Kitchen aspirin EC 81 MG tablet Take 81 mg by mouth daily.    . cetirizine (ZYRTEC) 10 MG tablet Take 10 mg by mouth at bedtime.    Marland Kitchen EPINEPHrine (EPIPEN 2-PAK) 0.3 mg/0.3 mL IJ SOAJ injection Inject 0.3 mLs (0.3 mg total) into the muscle as needed for anaphylaxis. 1 each 1  . fluticasone (FLONASE) 50 MCG/ACT nasal spray Place 2 sprays into both nostrils daily. 16 g 0  . gabapentin (NEURONTIN) 100 MG capsule TAKE 1 CAPSULE BY MOUTH AT BEDTIME 90 capsule 1  . hydrochlorothiazide (HYDRODIURIL) 25 MG tablet TAKE 1 TABLET BY MOUTH DAILY 90 tablet 1  . HYDROcodone-acetaminophen (NORCO/VICODIN) 5-325 MG tablet Take 0.5-1 tablets by mouth 2 (two) times daily as needed (severe back pain.).     Marland Kitchen MYRBETRIQ 25 MG TB24 tablet Take 25 mg by mouth at bedtime.     Marland Kitchen omeprazole  (PRILOSEC) 20 MG capsule TAKE 1 CAPSULE BY MOUTH ONCE DAILY 30 capsule 5  . Polyethyl Glycol-Propyl Glycol (SYSTANE) 0.4-0.3 % SOLN Place 1 drop into both eyes 3 (three) times daily as needed (dry/irritated eyes.).    Marland Kitchen rosuvastatin (CRESTOR) 40 MG tablet TAKE 1 TABLET BY MOUTH DAILY 90 tablet 1  . venlafaxine XR (EFFEXOR-XR) 150 MG 24 hr capsule TAKE 1 CAPSULE BY MOUTH EVERY DAY 90 capsule 2   No  current facility-administered medications on file prior to visit.     Review of Systems  Constitutional: Positive for fatigue. Negative for activity change, appetite change and fever.  HENT: Negative for congestion and sore throat.   Eyes: Negative for itching and visual disturbance.  Respiratory: Negative for cough and shortness of breath.   Cardiovascular: Negative for leg swelling.  Gastrointestinal: Negative for abdominal distention, abdominal pain, constipation, diarrhea and nausea.  Endocrine: Negative for cold intolerance and polydipsia.  Genitourinary: Positive for dysuria, frequency and urgency. Negative for decreased urine volume, difficulty urinating, flank pain and hematuria.  Musculoskeletal: Negative for myalgias.  Skin: Negative for rash.  Allergic/Immunologic: Negative for immunocompromised state.  Neurological: Negative for dizziness and weakness.  Hematological: Negative for adenopathy.       Objective:   Physical Exam Constitutional:      General: She is not in acute distress.    Appearance: Normal appearance. She is well-developed. She is obese. She is not ill-appearing.  HENT:     Head: Normocephalic and atraumatic.  Eyes:     Conjunctiva/sclera: Conjunctivae normal.     Pupils: Pupils are equal, round, and reactive to light.  Cardiovascular:     Rate and Rhythm: Normal rate and regular rhythm.     Heart sounds: Normal heart sounds.  Pulmonary:     Effort: Pulmonary effort is normal.     Breath sounds: Normal breath sounds.  Abdominal:     General: Bowel sounds  are normal. There is no distension.     Palpations: Abdomen is soft.     Tenderness: There is abdominal tenderness. There is no right CVA tenderness, left CVA tenderness or rebound.     Comments: No cva tenderness  no suprapubic tenderness or fullness  Musculoskeletal:     Cervical back: Normal range of motion and neck supple.     Right lower leg: No edema.     Left lower leg: No edema.  Lymphadenopathy:     Cervical: No cervical adenopathy.  Skin:    Findings: No rash.  Neurological:     Mental Status: She is alert.  Psychiatric:        Mood and Affect: Mood normal.           Assessment & Plan:   Problem List Items Addressed This Visit      Genitourinary   Acute cystitis - Primary    Pt has h/o recurrent utis as well as stress incontinence tx by Dr Sheppard Penton (urologist)  ua today with protein/ blood and small leuk  Will cover with keflex 500 bid Pending urine culture to update/adj care prn  Enc more water/less tea  inst to call if symptoms suddenly worsen         Relevant Orders   Urine Culture    Other Visit Diagnoses    Urinary incontinence, unspecified type       Relevant Orders   POCT Urinalysis Dipstick (Automated) (Completed)

## 2019-11-21 NOTE — Patient Instructions (Addendum)
Drink water  Take the generic keflex as directed for uti   I sent your urine for a culture and we will get in touch when that returns and advise you from there   If symptoms suddenly worsen please call

## 2019-11-21 NOTE — Assessment & Plan Note (Addendum)
Pt has h/o recurrent utis as well as stress incontinence tx by Dr Sheppard Penton (urologist)  ua today with protein/ blood and small leuk  Will cover with keflex 500 bid Pending urine culture to update/adj care prn  Enc more water/less tea  inst to call if symptoms suddenly worsen

## 2019-11-23 LAB — URINE CULTURE
MICRO NUMBER:: 10373183
SPECIMEN QUALITY:: ADEQUATE

## 2019-11-26 ENCOUNTER — Other Ambulatory Visit: Payer: Self-pay | Admitting: Family Medicine

## 2019-11-27 ENCOUNTER — Ambulatory Visit: Payer: Medicare HMO | Admitting: Occupational Therapy

## 2019-12-01 ENCOUNTER — Other Ambulatory Visit (INDEPENDENT_AMBULATORY_CARE_PROVIDER_SITE_OTHER): Payer: Medicare HMO

## 2019-12-01 ENCOUNTER — Telehealth: Payer: Self-pay

## 2019-12-01 DIAGNOSIS — N390 Urinary tract infection, site not specified: Secondary | ICD-10-CM | POA: Diagnosis not present

## 2019-12-01 MED ORDER — CEPHALEXIN 500 MG PO CAPS: 500.0000 mg | ORAL_CAPSULE | Freq: Two times a day (BID) | ORAL | 0 refills | Status: DC

## 2019-12-01 NOTE — Telephone Encounter (Signed)
Please ask her to drop off urine for a culture - to check for resistance In the meantime refill her keflex (500 mg bid for 7 d)  Enc good water intake as well

## 2019-12-01 NOTE — Telephone Encounter (Signed)
Patient received message, returned call.

## 2019-12-01 NOTE — Telephone Encounter (Addendum)
I spoke with pt; pt said she saw Dr Milinda Antis on 11/21/19 and started abx; pt finished abx end of last wk (pt not sure of day finished)and all symptoms of UTI were gone when finished abx and on 11/28/19 began again with frequency and urgency of urine; no pain or burning when urinates. No fever. Pt [facility]ill [facility]. Pt did not call PCP since she most recently saw Dr Milinda Antis. Pt request cb after review by Dr Milinda Antis.

## 2019-12-01 NOTE — Telephone Encounter (Signed)
Left message on voicemail for patient to call back. 

## 2019-12-01 NOTE — Telephone Encounter (Signed)
Patient notified as instructed by telephone and verbalized understanding. Patient stated that she will drop off a urine within the next couple of hours. Refill sent to pharmacy as instructed. Per Dr. Milinda Antis urine needs to be sent for a culture.

## 2019-12-03 LAB — URINE CULTURE
MICRO NUMBER:: 10405470
SPECIMEN QUALITY:: ADEQUATE

## 2019-12-05 NOTE — Telephone Encounter (Signed)
Pt notified of Dr. Tower's comments  

## 2019-12-05 NOTE — Telephone Encounter (Signed)
She can stop the medication, thanks

## 2019-12-05 NOTE — Telephone Encounter (Signed)
Pt called with symptoms update and for Culture results Tower, Audrie Gallus, MD  12/03/2019 8:03 AM EDT    Urine culture is negative  How are her symptoms?   Results reviewed with patient, expressed understanding.   Pt states that her symptoms have resolved x 1-2 days. Pt still feels a little irritation when she urinates but nothing like what it was.  Pt did start the medication sent in on 4/26 -- do you want her to continue it?

## 2019-12-22 ENCOUNTER — Other Ambulatory Visit: Payer: Self-pay

## 2019-12-22 ENCOUNTER — Other Ambulatory Visit
Admission: RE | Admit: 2019-12-22 | Discharge: 2019-12-22 | Disposition: A | Discharge status: Home / Self Care | Payer: Medicare HMO | Source: Ambulatory Visit | Attending: Internal Medicine | Admitting: Internal Medicine

## 2019-12-22 DIAGNOSIS — Z20822 Contact with and (suspected) exposure to covid-19: Secondary | ICD-10-CM | POA: Diagnosis not present

## 2019-12-22 DIAGNOSIS — Z01812 Encounter for preprocedural laboratory examination: Secondary | ICD-10-CM | POA: Insufficient documentation

## 2019-12-22 LAB — SARS CORONAVIRUS 2 (TAT 6-24 HRS): SARS Coronavirus 2: NEGATIVE

## 2019-12-24 ENCOUNTER — Ambulatory Visit: Payer: Medicare HMO | Admitting: Certified Registered"

## 2019-12-24 ENCOUNTER — Other Ambulatory Visit: Payer: Self-pay

## 2019-12-24 ENCOUNTER — Encounter: Payer: Self-pay | Admitting: Internal Medicine

## 2019-12-24 ENCOUNTER — Encounter
Admission: RE | Disposition: A | Discharge status: Home / Self Care | Payer: Self-pay | Source: Home / Self Care | Attending: Internal Medicine

## 2019-12-24 ENCOUNTER — Ambulatory Visit
Admission: RE | Admit: 2019-12-24 | Discharge: 2019-12-24 | Disposition: A | Discharge status: Home / Self Care | Payer: Medicare HMO | Attending: Internal Medicine | Admitting: Internal Medicine

## 2019-12-24 DIAGNOSIS — M1990 Unspecified osteoarthritis, unspecified site: Secondary | ICD-10-CM | POA: Diagnosis not present

## 2019-12-24 DIAGNOSIS — I10 Essential (primary) hypertension: Secondary | ICD-10-CM | POA: Insufficient documentation

## 2019-12-24 DIAGNOSIS — Z888 Allergy status to other drugs, medicaments and biological substances status: Secondary | ICD-10-CM | POA: Diagnosis not present

## 2019-12-24 DIAGNOSIS — Z7982 Long term (current) use of aspirin: Secondary | ICD-10-CM | POA: Diagnosis not present

## 2019-12-24 DIAGNOSIS — Z1211 Encounter for screening for malignant neoplasm of colon: Secondary | ICD-10-CM | POA: Diagnosis present

## 2019-12-24 DIAGNOSIS — K219 Gastro-esophageal reflux disease without esophagitis: Secondary | ICD-10-CM | POA: Diagnosis not present

## 2019-12-24 DIAGNOSIS — E785 Hyperlipidemia, unspecified: Secondary | ICD-10-CM | POA: Diagnosis not present

## 2019-12-24 DIAGNOSIS — K640 First degree hemorrhoids: Secondary | ICD-10-CM | POA: Insufficient documentation

## 2019-12-24 DIAGNOSIS — F419 Anxiety disorder, unspecified: Secondary | ICD-10-CM | POA: Diagnosis not present

## 2019-12-24 DIAGNOSIS — Z8673 Personal history of transient ischemic attack (TIA), and cerebral infarction without residual deficits: Secondary | ICD-10-CM | POA: Diagnosis not present

## 2019-12-24 DIAGNOSIS — Z79899 Other long term (current) drug therapy: Secondary | ICD-10-CM | POA: Insufficient documentation

## 2019-12-24 DIAGNOSIS — F329 Major depressive disorder, single episode, unspecified: Secondary | ICD-10-CM | POA: Diagnosis not present

## 2019-12-24 HISTORY — PX: COLONOSCOPY WITH PROPOFOL: SHX5780

## 2019-12-24 SURGERY — COLONOSCOPY WITH PROPOFOL
Anesthesia: General

## 2019-12-24 MED ORDER — PROPOFOL 500 MG/50ML IV EMUL
INTRAVENOUS | Status: DC | PRN
  Administered 2019-12-24: 120 ug/kg/min via INTRAVENOUS

## 2019-12-24 MED ORDER — SODIUM CHLORIDE 0.9 % IV SOLN: INTRAVENOUS | Status: DC

## 2019-12-24 MED ORDER — PROPOFOL 10 MG/ML IV BOLUS
INTRAVENOUS | Status: AC
  Filled 2019-12-24: qty 20

## 2019-12-24 NOTE — Anesthesia Preprocedure Evaluation (Signed)
Anesthesia Evaluation  Patient identified by MRN, date of birth, ID band Patient awake    Reviewed: Allergy & Precautions, H&P , NPO status , Patient's Chart, lab work & pertinent test results  Airway Mallampati: II  TM Distance: >3 FB Neck ROM: full    Dental  (+) Chipped, Teeth Intact   Pulmonary neg COPD, former smoker,    Pulmonary exam normal        Cardiovascular hypertension, (-) angina(-) Past MI Normal cardiovascular exam     Neuro/Psych PSYCHIATRIC DISORDERS Anxiety Depression CVA    GI/Hepatic Neg liver ROS, GERD  ,  Endo/Other  negative endocrine ROS  Renal/GU negative Renal ROS  negative genitourinary   Musculoskeletal   Abdominal   Peds  Hematology negative hematology ROS (+)   Anesthesia Other Findings Past Medical History: No date: Abnormal uterine bleeding (AUB)     Comment:  a. benign endometrial polyps on D+C in 03/2017 No date: Anxiety No date: Arthritis No date: Chicken pox No date: Depression No date: GERD (gastroesophageal reflux disease) No date: History of stress test     Comment:  a. 08/2017 Lexiscan MV: EF 55-65%. No ischemia/infarct.               Low risk. No date: Hyperlipidemia No date: Hypertension No date: Lacunar infarction [facility])     Comment:  a. Dx in setting of dizziness/vertigo; b. 02/2018 Carotid              U/S: bilateral intimal thickening and atherosclerotic               plaque. No hemodynamically significant stenoses; c.               03/2018 MRI Brain: No mass or abnl enhancement. Chronic               microvascular ischemic changes and mod volume loss of the              brain. 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 No date: Stroke ([facility])     Comment:  PT STATES SHE WAS TOLD THIS BY HER PCP BUT PT STATES SHE              NEVER KNEW SHE HAD A STROKE-NO RESIDUAL EFFECTS No date: Vertigo  Past Surgical History: No date: BACK  SURGERY 07/30/2019: CARPAL TUNNEL RELEASE; Right 05/15/2019: CYSTOSCOPY MACROPLASTIQUE IMPLANT; N/A     Comment:  Procedure: CYSTOSCOPY MACROPLASTIQUE IMPLANT;  Surgeon:               Orson Ape, MD;  Location: [facility] ORS;  Service:               Urology;  Laterality: N/A; 03/30/2017: DILATATION & CURETTAGE/HYSTEROSCOPY WITH MYOSURE; N/A     Comment:  Procedure: DILATATION & CURETTAGE/HYSTEROSCOPY WITH               MYOSURE;  Surgeon: Schermerhorn, Ihor Austin, MD;  Location:              [facility] ORS;  Service: Gynecology;  Laterality: N/A; No date: EYE SURGERY; Bilateral     Comment:  Cataract Extraction with IOL No date: SKIN LESION EXCISION No date: TONSILLECTOMY AND ADENOIDECTOMY     Comment:  Age 79  BMI    Body Mass Index: 31.35 kg/m      Reproductive/Obstetrics negative OB ROS  Anesthesia Physical Anesthesia Plan  ASA: II  Anesthesia Plan: General   Post-op Pain Management:    Induction:   PONV Risk Score and Plan: Propofol infusion and TIVA  Airway Management Planned: Natural Airway and Nasal Cannula  Additional Equipment:   Intra-op Plan:   Post-operative Plan:   Informed Consent: I have reviewed the patients History and Physical, chart, labs and discussed the procedure including the risks, benefits and alternatives for the proposed anesthesia with the patient or authorized representative who has indicated his/her understanding and acceptance.     Dental Advisory Given  Plan Discussed with: Anesthesiologist, CRNA and Surgeon  Anesthesia Plan Comments:         Anesthesia Quick Evaluation

## 2019-12-24 NOTE — Transfer of Care (Signed)
Immediate Anesthesia Transfer of Care Note  Patient: Ashley [name]  Procedure(s) Performed: COLONOSCOPY WITH PROPOFOL (N/A )  Patient Location: PACU and Endoscopy Unit  Anesthesia Type:General  Level of Consciousness: sedated  Airway & Oxygen Therapy: Patient Spontanous Breathing  Post-op Assessment: Report given to RN  Post vital signs: stable  Last Vitals:  Vitals Value Taken Time  BP    Temp    Pulse    Resp    SpO2      Last Pain:  Vitals:   12/24/19 1024  TempSrc: Temporal  PainSc: 0-No pain         Complications: No apparent anesthesia complications

## 2019-12-24 NOTE — Op Note (Signed)
[facility] Patient Name: Ashley [name] Procedure Date: 12/24/2019 11:02 AM MRN: [id number] Account #: [id number] Date of Birth: [birth year] Admit Type: Outpatient Age: 79 Room: [facility] ENDO ROOM 3 Gender: Female Note Status: Finalized Procedure:             Colonoscopy Indications:           Screening for colorectal malignant neoplasm Providers:             [name]. Reona Zendejas MD, MD Medicines:             Propofol per Anesthesia Complications:         No immediate complications. Procedure:             Pre-Anesthesia Assessment:                        - The risks and benefits of the procedure and the                         sedation options and risks were discussed with the                         patient. All questions were answered and informed                         consent was obtained.                        - Patient identification and proposed procedure were                         verified prior to the procedure by the nurse. The                         procedure was verified in the procedure room.                        - ASA Grade Assessment: III - A patient with severe                         systemic disease.                        - After reviewing the risks and benefits, the patient                         was deemed in satisfactory condition to undergo the                         procedure.                        After obtaining informed consent, the colonoscope was                         passed under direct vision. Throughout the procedure,                         the patient's blood pressure, pulse, and oxygen  saturations were monitored continuously. The                         Colonoscope was introduced through the anus and                         advanced to the the cecum, identified by appendiceal                         orifice and ileocecal valve. The colonoscopy was                         performed  without difficulty. The patient tolerated                         the procedure well. The quality of the bowel                         preparation was good. The ileocecal valve, appendiceal                         orifice, and rectum were photographed. Findings:      The perianal and digital rectal examinations were normal. Pertinent       negatives include normal sphincter tone and no palpable rectal lesions.      Non-bleeding internal hemorrhoids were found during retroflexion. The       hemorrhoids were Grade I (internal hemorrhoids that do not prolapse).      The entire examined colon appeared normal. Impression:            - Non-bleeding internal hemorrhoids.                        - The entire examined colon is normal.                        - No specimens collected. Recommendation:        - Patient has a contact number available for                         emergencies. The signs and symptoms of potential                         delayed complications were discussed with the patient.                         Return to normal activities tomorrow. Written                         discharge instructions were provided to the patient.                        - Resume previous diet.                        - Continue present medications.                        - No repeat colonoscopy due to current age (47 years  or older), the absence of colonic polyps and no                         evidence of mucosal or other abnormalities on today's                         exam.                        - Return to GI office PRN.                        - The findings and recommendations were discussed with                         the patient. Procedure Code(s):     --- Professional ---                        B2841, Colorectal cancer screening; colonoscopy on                         individual not meeting criteria for high risk Diagnosis Code(s):     --- Professional ---                         K64.0, First degree hemorrhoids                        Z12.11, Encounter for screening for malignant neoplasm                         of colon CPT copyright 2019 American Medical Association. All rights reserved. The codes documented in this report are preliminary and upon coder review may  be revised to meet current compliance requirements. [name] MD, MD 12/24/2019 11:35:53 AM This report has been signed electronically. Number of Addenda: 0 Note Initiated On: 12/24/2019 11:02 AM Scope Withdrawal Time: 0 hours 5 minutes 58 seconds  Total Procedure Duration: 0 hours 10 minutes 6 seconds  Estimated Blood Loss:  Estimated blood loss: none.      [facility]

## 2019-12-24 NOTE — H&P (Signed)
Outpatient short stay form Pre-procedure 12/24/2019 10:37 AM Ashley [name] K. Ashley [name], M.D.  Primary Physician: Tommi Rumps, M.D.  Reason for visit: Colon cancer screening  History of present illness:  Patient presents for colonoscopy for colon cancer screening. The patient denies complaints of abdominal pain, significant change in bowel habits, or rectal bleeding.      Current Facility-Administered Medications:  .  0.9 %  sodium chloride infusion, , Intravenous, Continuous, [name], [name], MD  Medications Prior to Admission  Medication Sig Dispense Refill Last Dose  . aspirin EC 81 MG tablet Take 81 mg by mouth daily.   12/23/2019 at Unknown time  . acetaminophen (TYLENOL) 650 MG CR tablet Take 1,300 mg by mouth daily.     . cephALEXin (KEFLEX) 500 MG capsule Take 1 capsule (500 mg total) by mouth 2 (two) times daily. 14 capsule 0   . cetirizine (ZYRTEC) 10 MG tablet Take 10 mg by mouth at bedtime.     Marland Kitchen EPINEPHrine (EPIPEN 2-PAK) 0.3 mg/0.3 mL IJ SOAJ injection Inject 0.3 mLs (0.3 mg total) into the muscle as needed for anaphylaxis. 1 each 1   . fluticasone (FLONASE) 50 MCG/ACT nasal spray Place 2 sprays into both nostrils daily. 16 g 0   . gabapentin (NEURONTIN) 100 MG capsule TAKE 1 CAPSULE BY MOUTH AT BEDTIME 90 capsule 1   . hydrochlorothiazide (HYDRODIURIL) 25 MG tablet TAKE 1 TABLET BY MOUTH DAILY 90 tablet 1   . HYDROcodone-acetaminophen (NORCO/VICODIN) 5-325 MG tablet Take 0.5-1 tablets by mouth 2 (two) times daily as needed (severe back pain.).      Marland Kitchen MYRBETRIQ 25 MG TB24 tablet Take 25 mg by mouth at bedtime.      Marland Kitchen omeprazole (PRILOSEC) 20 MG capsule TAKE 1 CAPSULE BY MOUTH ONCE DAILY 30 capsule 5   . Polyethyl Glycol-Propyl Glycol (SYSTANE) 0.4-0.3 % SOLN Place 1 drop into both eyes 3 (three) times daily as needed (dry/irritated eyes.).     Marland Kitchen rosuvastatin (CRESTOR) 40 MG tablet TAKE 1 TABLET BY MOUTH DAILY 90 tablet 1   . venlafaxine XR (EFFEXOR-XR) 150 MG 24 hr capsule  TAKE 1 CAPSULE BY MOUTH EVERY DAY 90 capsule 2      Allergies  Allergen Reactions  . Ace Inhibitors     Angioedema  . Pravastatin Other (See Comments)    Other reaction(s): Muscle Pain  . Simvastatin Other (See Comments)    stiffness     Past Medical History:  Diagnosis Date  . Abnormal uterine bleeding (AUB)    a. benign endometrial polyps on D+C in 03/2017  . Anxiety   . Arthritis   . Chicken pox   . Depression   . GERD (gastroesophageal reflux disease)   . History of stress test    a. 08/2017 Lexiscan MV: EF 55-65%. No ischemia/infarct. Low risk.  Marland Kitchen Hyperlipidemia   . Hypertension   . 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 Dx in setting of dizziness/vertigo; b. 02/2018 Carotid U/S: bilateral intimal thickening and atherosclerotic plaque. No hemodynamically significant stenoses; c. 03/2018 MRI Brain: No mass or abnl enhancement. Chronic microvascular ischemic changes and mod volume loss of the brain. 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  . Stroke ([place])    PT STATES SHE WAS TOLD THIS BY HER PCP BUT PT STATES SHE NEVER KNEW SHE HAD A STROKE-NO RESIDUAL EFFECTS  . Vertigo     Review of systems:  Otherwise negative.    Physical Exam  Gen: Alert, oriented.  Appears stated age.  HEENT: [place]/AT. PERRLA. Lungs: CTA, no wheezes. CV: RR nl S1, S2. Abd: soft, benign, no masses. BS+ Ext: No edema. Pulses 2+    Planned procedures: Proceed with colonoscopy. The patient understands the nature of the planned procedure, indications, risks, alternatives and potential complications including but not limited to bleeding, infection, perforation, damage to internal organs and possible oversedation/side effects from anesthesia. The patient agrees and gives consent to proceed.  Please refer to procedure notes for findings, recommendations and patient disposition/instructions.     [name] K. [name], M.D. Gastroenterology 12/24/2019  10:37 AM

## 2019-12-25 ENCOUNTER — Encounter: Payer: Self-pay | Admitting: *Deleted

## 2019-12-25 NOTE — Anesthesia Postprocedure Evaluation (Signed)
Anesthesia Post Note  Patient: Ashley [name]  Procedure(s) Performed: COLONOSCOPY WITH PROPOFOL (N/A )  Patient location during evaluation: PACU Anesthesia Type: General Level of consciousness: awake and alert Pain management: pain level controlled Vital Signs Assessment: post-procedure vital signs reviewed and stable Respiratory status: spontaneous breathing, nonlabored ventilation and respiratory function stable Cardiovascular status: blood pressure returned to baseline and stable Postop Assessment: no apparent nausea or vomiting Anesthetic complications: no     Last Vitals:  Vitals:   12/24/19 1150 12/24/19 1200  BP: (!) 161/83 (!) 147/79  Pulse:    Resp:    Temp:    SpO2:      Last Pain:  Vitals:   12/24/19 1130  TempSrc: Temporal  PainSc:                  Karleen Hampshire

## 2020-01-22 ENCOUNTER — Telehealth: Payer: Self-pay | Admitting: Family Medicine

## 2020-01-22 NOTE — Telephone Encounter (Signed)
Patient stated she would comply.

## 2020-01-22 NOTE — Telephone Encounter (Signed)
Pt called and wanted to let Dr. Birdie Sons know that she has been dealing with Vertigo for the past few days and her pluses is running around 106

## 2020-01-22 NOTE — Telephone Encounter (Signed)
If her symptoms persist she should be seen in the office.

## 2020-01-24 ENCOUNTER — Other Ambulatory Visit: Payer: Self-pay | Admitting: Family Medicine

## 2020-02-03 ENCOUNTER — Ambulatory Visit: Payer: Medicare HMO | Admitting: Family Medicine

## 2020-02-03 ENCOUNTER — Other Ambulatory Visit: Payer: Self-pay

## 2020-02-03 ENCOUNTER — Encounter: Payer: Self-pay | Admitting: Family Medicine

## 2020-02-03 DIAGNOSIS — R000 Tachycardia, unspecified: Secondary | ICD-10-CM | POA: Insufficient documentation

## 2020-02-03 DIAGNOSIS — R42 Dizziness and giddiness: Secondary | ICD-10-CM | POA: Diagnosis not present

## 2020-02-03 DIAGNOSIS — R6884 Jaw pain: Secondary | ICD-10-CM | POA: Diagnosis not present

## 2020-02-03 LAB — CBC
HCT: 41.6 % (ref 36.0–46.0)
Hemoglobin: 13.8 g/dL (ref 12.0–15.0)
MCHC: 33.2 g/dL (ref 30.0–36.0)
MCV: 88.6 fl (ref 78.0–100.0)
Platelets: 329 10*3/uL (ref 150.0–400.0)
RBC: 4.69 Mil/uL (ref 3.87–5.11)
RDW: 14.6 % (ref 11.5–15.5)
WBC: 8.7 10*3/uL (ref 4.0–10.5)

## 2020-02-03 LAB — COMPREHENSIVE METABOLIC PANEL
ALT: 15 U/L (ref 0–35)
AST: 20 U/L (ref 0–37)
Albumin: 4.4 g/dL (ref 3.5–5.2)
Alkaline Phosphatase: 53 U/L (ref 39–117)
BUN: 13 mg/dL (ref 6–23)
CO2: 30 mEq/L (ref 19–32)
Calcium: 9.9 mg/dL (ref 8.4–10.5)
Chloride: 98 mEq/L (ref 96–112)
Creatinine, Ser: 0.78 mg/dL (ref 0.40–1.20)
GFR: 71.66 mL/min (ref >60.00)
Glucose, Bld: 120 mg/dL — ABNORMAL HIGH (ref 70–99)
Potassium: 3.9 mEq/L (ref 3.5–5.1)
Sodium: 138 mEq/L (ref 135–145)
Total Bilirubin: 0.4 mg/dL (ref 0.2–1.2)
Total Protein: 6.9 g/dL (ref 6.0–8.3)

## 2020-02-03 LAB — TSH: TSH: 1.98 u[IU]/mL (ref 0.35–4.50)

## 2020-02-03 NOTE — Assessment & Plan Note (Signed)
Very mild.  Discussed adequate hydration.  We will check lab work to look for underlying dehydration, anemia, or thyroid issue.  She has no symptoms of palpitations and thus I do not feel that EKG is warranted at this time.  She will monitor if she develops symptoms or has worsening tachycardia she will let us know right away.

## 2020-02-03 NOTE — Assessment & Plan Note (Addendum)
Improved.  Her description of the lightheadedness seems more consistent with vertigo than true lightheadedness.  She has seen ENT.  Orthostatics completed today.  She will monitor.

## 2020-02-03 NOTE — Progress Notes (Signed)
Ashley Rumps, MD Phone: [phone]  DAYELIN BALDUCCI is a 79 y.o. female who presents today for same day visit.   Elevated heart rate: Patient notes its been up to 112 at home.  Has been 106 in the doctor's office.  Nothing significantly elevated.  No palpitations.  She notes she drinks plenty of fluids.  She did note some issues with vertigo and did see ENT though her symptoms had resolved by the time she saw them.  They referred her back here for the elevated heart rate.  She has been a little lightheaded occasionally it seems more consistent with vertigo based on her description of it occurring when she tilts her head back.  Does not occur when she rises.  No chest pain or shortness of breath.  Does take hydrochlorothiazide.  Jaw pain: Patient notes right lower jaw discomfort that wakes her up from sleep at times.  Had this in the past and was evaluated by cardiology with negative work-up.  She notes no tooth pain.  Does not typically occur during the day.  Social History   Tobacco Use  Smoking Status Former Smoker  . Packs/day: 0.50  . Years: 15.00  . Pack years: 7.50  . Types: Cigarettes  . Quit date: 08/07/1976  . Years since quitting: 43.5  Smokeless Tobacco Never Used     ROS see history of present illness  Objective  Physical Exam Vitals:   02/03/20 0944  BP: 130/70  Pulse: 98  Temp: 98.2 F (36.8 C)  SpO2: 95%   Laying blood pressure 139/81 pulse 102 Sitting blood pressure 142/94 pulse 100. Standing blood pressure 137/90 pulse 99  BP Readings from Last 3 Encounters:  02/03/20 130/70  12/24/19 (!) 147/79  11/21/19 124/76   Wt Readings from Last 3 Encounters:  02/03/20 155 lb 9.6 oz (70.6 kg)  12/24/19 150 lb (68 kg)  11/21/19 154 lb (69.9 kg)    Physical Exam Constitutional:      General: She is not in acute distress.    Appearance: She is not diaphoretic.  HENT:     Head:     Comments: No TMJ tenderness, no tenderness of either side of the  mandible, no swelling, no cervical or submandibular adenopathy    Right Ear: Tympanic membrane normal.     Left Ear: Tympanic membrane normal.  Cardiovascular:     Rate and Rhythm: Regular rhythm. Tachycardia present.     Heart sounds: Normal heart sounds.     Comments: Mild tachycardia, rate 104 Pulmonary:     Effort: Pulmonary effort is normal.     Breath sounds: Normal breath sounds.  Musculoskeletal:     Right lower leg: No edema.     Left lower leg: No edema.  Skin:    General: Skin is warm and dry.  Neurological:     Mental Status: She is alert.      Assessment/Plan: Please see individual problem list.  Tachycardia Very mild.  Discussed adequate hydration.  We will check lab work to look for underlying dehydration, anemia, or thyroid issue.  She has no symptoms of palpitations and thus I do not feel that EKG is warranted at this time.  She will monitor if she develops symptoms or has worsening tachycardia she will let us know right away.  Vertigo Improved.  Her description of the lightheadedness seems more consistent with vertigo than true lightheadedness.  She has seen ENT.  Orthostatics completed today.  She will monitor.  Jaw pain  Chronic intermittent issue.  Possibly related to clenching her jaw.  If it persists she should see her dentist.    Orders Placed This Encounter  Procedures  . Comp Met (CMET)  . TSH  . CBC    No orders of the defined types were placed in this encounter.   This visit occurred during the SARS-CoV-2 public health emergency.  Safety protocols were in place, including screening questions prior to the visit, additional usage of staff PPE, and extensive cleaning of exam room while observing appropriate contact time as indicated for disinfecting solutions.    Ashley Rumps, MD [place]

## 2020-02-03 NOTE — Patient Instructions (Addendum)
Nice to see you. We will get lab work today and contact you with the results. Please monitor the lightheadedness.  If it is not progressively improving please let us know. If you develop higher heart rates or you develop any symptoms such as palpitations, chest pain, or other issues please let us know. If your jaw discomfort is persisting please see your dentist.

## 2020-02-03 NOTE — Assessment & Plan Note (Signed)
Chronic intermittent issue.  Possibly related to clenching her jaw.  If it persists she should see her dentist.

## 2020-03-30 DIAGNOSIS — M1712 Unilateral primary osteoarthritis, left knee: Secondary | ICD-10-CM | POA: Insufficient documentation

## 2020-04-09 ENCOUNTER — Other Ambulatory Visit: Payer: Self-pay | Admitting: Family Medicine

## 2020-04-27 ENCOUNTER — Telehealth: Payer: Self-pay

## 2020-04-27 ENCOUNTER — Ambulatory Visit: Payer: Medicare HMO

## 2020-04-27 NOTE — Telephone Encounter (Signed)
Unsuccessful attempt to reach patient for scheduled annual wellness visit. No answer. Left voicemail to call back duri g allotted timeframe or reschedule.

## 2020-05-04 ENCOUNTER — Ambulatory Visit (INDEPENDENT_AMBULATORY_CARE_PROVIDER_SITE_OTHER): Payer: Medicare HMO

## 2020-05-04 VITALS — Ht 59.0 in | Wt 155.0 lb

## 2020-05-04 DIAGNOSIS — Z0000 Encounter for general adult medical examination without abnormal findings: Secondary | ICD-10-CM

## 2020-05-04 NOTE — Patient Instructions (Addendum)
Ashley [name] , Thank you for taking time to come for your Medicare Wellness Visit. I appreciate your ongoing commitment to your health goals. Please review the following plan we discussed and let me know if I can assist you in the future.   These are the goals we discussed: Goals    . Follow up with primary care provider as needed     Water aerobics       This is a list of the screening recommended for you and due dates:  Health Maintenance  Topic Date Due  .  Hepatitis C: One time screening is recommended by Center for Disease Control  (CDC) for  adults born from [birth year] through 1963.   Never done  . Flu Shot  03/07/2020  . Tetanus Vaccine  08/07/2021  . DEXA scan (bone density measurement)  Completed  . COVID-19 Vaccine  Completed  . Pneumonia vaccines  Completed    Immunizations Immunization History  Administered Date(s) Administered  . Influenza, High Dose Seasonal PF 04/19/2018, 04/16/2019, 04/16/2019  . Influenza-Unspecified 05/17/2016, 05/17/2017  . Zoster Recombinat (Shingrix) 03/28/2018, 07/22/2018   Advanced directives: End of life planning; Advance aging; Advanced directives discussed.  Copy of current HCPOA/Living Will requested.    Conditions/risks identified: none new  Follow up in one year for your annual wellness visit.   Preventive Care 70 Years and Older, Female Preventive care refers to lifestyle choices and visits with your health care provider that can promote health and wellness. What does preventive care include?  A yearly physical exam. This is also called an annual well check.  Dental exams once or twice a year.  Routine eye exams. Ask your health care provider how often you should have your eyes checked.  Personal lifestyle choices, including:  Daily care of your teeth and gums.  Regular physical activity.  Eating a healthy diet.  Avoiding tobacco and drug use.  Limiting alcohol use.  Practicing safe sex.  Taking low-dose aspirin every  day.  Taking vitamin and mineral supplements as recommended by your health care provider. What happens during an annual well check? The services and screenings done by your health care provider during your annual well check will depend on your age, overall health, lifestyle risk factors, and family history of disease. Counseling  Your health care provider may ask you questions about your:  Alcohol use.  Tobacco use.  Drug use.  Emotional well-being.  Home and relationship well-being.  Sexual activity.  Eating habits.  History of falls.  Memory and ability to understand (cognition).  Work and work Astronomer.  Reproductive health. Screening  You may have the following tests or measurements:  Height, weight, and BMI.  Blood pressure.  Lipid and cholesterol levels. These may be checked every 5 years, or more frequently if you are over 66 years old.  Skin check.  Lung cancer screening. You may have this screening every year starting at age 63 if you have a 30-pack-year history of smoking and currently smoke or have quit within the past 15 years.  Fecal occult blood test (FOBT) of the stool. You may have this test every year starting at age 44.  Flexible sigmoidoscopy or colonoscopy. You may have a sigmoidoscopy every 5 years or a colonoscopy every 10 years starting at age 27.  Hepatitis C blood test.  Hepatitis B blood test.  Sexually transmitted disease (STD) testing.  Diabetes screening. This is done by checking your blood sugar (glucose) after you have not eaten for  a while (fasting). You may have this done every 1-3 years.  Bone density scan. This is done to screen for osteoporosis. You may have this done starting at age 88.  Mammogram. This may be done every 1-2 years. Talk to your health care provider about how often you should have regular mammograms. Talk with your health care provider about your test results, treatment options, and if necessary, the need  for more tests. Vaccines  Your health care provider may recommend certain vaccines, such as:  Influenza vaccine. This is recommended every year.  Tetanus, diphtheria, and acellular pertussis (Tdap, Td) vaccine. You may need a Td booster every 10 years.  Zoster vaccine. You may need this after age 78.  Pneumococcal 13-valent conjugate (PCV13) vaccine. One dose is recommended after age 24.  Pneumococcal polysaccharide (PPSV23) vaccine. One dose is recommended after age 20. Talk to your health care provider about which screenings and vaccines you need and how often you need them. This information is not intended to replace advice given to you by your health care provider. Make sure you discuss any questions you have with your health care provider. Document Released: 08/20/2015 Document Revised: 04/12/2016 Document Reviewed: 05/25/2015 Elsevier Interactive Patient Education  2017 [employer].  Fall Prevention in the Home Falls can cause injuries. They can happen to people of all ages. There are many things you can do to make your home safe and to help prevent falls. What can I do on the outside of my home?  Regularly fix the edges of walkways and driveways and fix any cracks.  Remove anything that might make you trip as you walk through a door, such as a raised step or threshold.  Trim any bushes or trees on the path to your home.  Use bright outdoor lighting.  Clear any walking paths of anything that might make someone trip, such as rocks or tools.  Regularly check to see if handrails are loose or broken. Make sure that both sides of any steps have handrails.  Any raised decks and porches should have guardrails on the edges.  Have any leaves, snow, or ice cleared regularly.  Use sand or salt on walking paths during winter.  Clean up any spills in your garage right away. This includes oil or grease spills. What can I do in the bathroom?  Use night lights.  Install grab bars  by the toilet and in the tub and shower. Do not use towel bars as grab bars.  Use non-skid mats or decals in the tub or shower.  If you need to sit down in the shower, use a plastic, non-slip stool.  Keep the floor dry. Clean up any water that spills on the floor as soon as it happens.  Remove soap buildup in the tub or shower regularly.  Attach bath mats securely with double-sided non-slip rug tape.  Do not have throw rugs and other things on the floor that can make you trip. What can I do in the bedroom?  Use night lights.  Make sure that you have a light by your bed that is easy to reach.  Do not use any sheets or blankets that are too big for your bed. They should not hang down onto the floor.  Have a firm chair that has side arms. You can use this for support while you get dressed.  Do not have throw rugs and other things on the floor that can make you trip. What can I do in  the kitchen?  Clean up any spills right away.  Avoid walking on wet floors.  Keep items that you use a lot in easy-to-reach places.  If you need to reach something above you, use a strong step stool that has a grab bar.  Keep electrical cords out of the way.  Do not use floor polish or wax that makes floors slippery. If you must use wax, use non-skid floor wax.  Do not have throw rugs and other things on the floor that can make you trip. What can I do with my stairs?  Do not leave any items on the stairs.  Make sure that there are handrails on both sides of the stairs and use them. Fix handrails that are broken or loose. Make sure that handrails are as long as the stairways.  Check any carpeting to make sure that it is firmly attached to the stairs. Fix any carpet that is loose or worn.  Avoid having throw rugs at the top or bottom of the stairs. If you do have throw rugs, attach them to the floor with carpet tape.  Make sure that you have a light switch at the top of the stairs and the  bottom of the stairs. If you do not have them, ask someone to add them for you. What else can I do to help prevent falls?  Wear shoes that:  Do not have high heels.  Have rubber bottoms.  Are comfortable and fit you well.  Are closed at the toe. Do not wear sandals.  If you use a stepladder:  Make sure that it is fully opened. Do not climb a closed stepladder.  Make sure that both sides of the stepladder are locked into place.  Ask someone to hold it for you, if possible.  Clearly mark and make sure that you can see:  Any grab bars or handrails.  First and last steps.  Where the edge of each step is.  Use tools that help you move around (mobility aids) if they are needed. These include:  Canes.  Walkers.  Scooters.  Crutches.  Turn on the lights when you go into a dark area. Replace any light bulbs as soon as they burn out.  Set up your furniture so you have a clear path. Avoid moving your furniture around.  If any of your floors are uneven, fix them.  If there are any pets around you, be aware of where they are.  Review your medicines with your doctor. Some medicines can make you feel dizzy. This can increase your chance of falling. Ask your doctor what other things that you can do to help prevent falls. This information is not intended to replace advice given to you by your health care provider. Make sure you discuss any questions you have with your health care provider. Document Released: 05/20/2009 Document Revised: 12/30/2015 Document Reviewed: 08/28/2014 Elsevier Interactive Patient Education  2017 [employer].

## 2020-05-04 NOTE — Progress Notes (Signed)
Subjective:   Ashley [name] is a 79 y.o. female who presents for Medicare Annual (Subsequent) preventive examination.  Review of Systems    No ROS.  Medicare Wellness Virtual Visit.   Cardiac Risk Factors include: advanced age (>37men, >58 women);hypertension     Objective:    Today's Vitals   05/04/20 1237  Weight: 155 lb (70.3 kg)  Height: 4\' 11"  (1.499 m)   Body mass index is 31.31 kg/m.  Advanced Directives 05/15/2019 05/12/2019 04/25/2019 03/08/2019 02/20/2019 04/19/2018 11/17/2017  Does Patient Have a Medical Advance Directive? Yes Yes Yes Yes Yes Yes Yes  Type of 11/19/2017 of [place];Living will Healthcare Power of [place];Living will Healthcare Power of [place];Living will Healthcare Power of [place];Living will  Does patient want to make changes to medical advance directive? No - Patient declined - No - Patient declined - No - Patient declined No - Patient declined -  Copy of Healthcare Power of Attorney in Chart? No - copy requested - No - copy requested - No - copy requested No - copy requested -    Current Medications (verified) Outpatient Encounter Medications as of 05/04/2020  Medication Sig  . acetaminophen (TYLENOL) 650 MG CR tablet Take 1,300 mg by mouth daily.  05/06/2020 aspirin EC 81 MG tablet Take 81 mg by mouth daily.  . cetirizine (ZYRTEC) 10 MG tablet Take 10 mg by mouth at bedtime.  Marland Kitchen EPINEPHrine (EPIPEN 2-PAK) 0.3 mg/0.3 mL IJ SOAJ injection Inject 0.3 mLs (0.3 mg total) into the muscle as needed for anaphylaxis.  . fluticasone (FLONASE) 50 MCG/ACT nasal spray Place 2 sprays into both nostrils daily.  Marland Kitchen gabapentin (NEURONTIN) 100 MG capsule TAKE 1 CAPSULE BY MOUTH AT BEDTIME  . hydrochlorothiazide (HYDRODIURIL) 25 MG tablet TAKE 1 TABLET BY MOUTH DAILY  . HYDROcodone-acetaminophen (NORCO/VICODIN) 5-325 MG tablet Take 0.5-1 tablets by mouth 2 (two) times daily as needed (severe  back pain.).   Marland Kitchen MYRBETRIQ 25 MG TB24 tablet Take 25 mg by mouth at bedtime.   Marland Kitchen omeprazole (PRILOSEC) 20 MG capsule TAKE 1 CAPSULE BY MOUTH ONCE DAILY  . Polyethyl Glycol-Propyl Glycol (SYSTANE) 0.4-0.3 % SOLN Place 1 drop into both eyes 3 (three) times daily as needed (dry/irritated eyes.).  Marland Kitchen rosuvastatin (CRESTOR) 40 MG tablet TAKE 1 TABLET BY MOUTH DAILY  . traMADol (ULTRAM) 50 MG tablet Take 25-50 mg by mouth 2 (two) times daily as needed.  . venlafaxine XR (EFFEXOR-XR) 150 MG 24 hr capsule TAKE 1 CAPSULE BY MOUTH EVERY DAY  . [DISCONTINUED] traMADol (ULTRAM) 50 MG tablet 1/2-1 po bid prn  . azelastine (OPTIVAR) 0.05 % ophthalmic solution Apply 1 drop to eye 2 (two) times daily. (Patient not taking: Reported on 05/04/2020)  . cephALEXin (KEFLEX) 500 MG capsule Take 1 capsule (500 mg total) by mouth 2 (two) times daily.   No facility-administered encounter medications on file as of 05/04/2020.    Allergies (verified) Ace inhibitors, Pravastatin, and Simvastatin   History: Past Medical History:  Diagnosis Date  . Abnormal uterine bleeding (AUB)    a. benign endometrial polyps on D+C in 03/2017  . Anxiety   . Arthritis   . Chicken pox   . Depression   . GERD (gastroesophageal reflux disease)   . History of stress test    a. 08/2017 Lexiscan MV: EF 55-65%. No ischemia/infarct. Low risk.  09/2017 Hyperlipidemia   . Hypertension   . Lacunar infarction (HCC)  a. Dx in setting of dizziness/vertigo; b. 02/2018 Carotid U/S: bilateral intimal thickening and atherosclerotic plaque. No hemodynamically significant stenoses; c. 03/2018 MRI Brain: No mass or abnl enhancement. Chronic microvascular ischemic changes and mod volume loss of the brain. 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  . Stroke (HCC)    PT STATES SHE WAS TOLD THIS BY HER PCP BUT PT STATES SHE NEVER KNEW SHE HAD A STROKE-NO RESIDUAL EFFECTS  . Vertigo    Past Surgical History:  Procedure Laterality Date  . BACK  SURGERY    . CARPAL TUNNEL RELEASE Right 07/30/2019  . COLONOSCOPY WITH PROPOFOL N/A 12/24/2019   Procedure: COLONOSCOPY WITH PROPOFOL;  Surgeon: Toledo, Boykin Nearingeodoro K, MD;  Location: [facility] ENDOSCOPY;  Service: Gastroenterology;  Laterality: N/A;  . CYSTOSCOPY MACROPLASTIQUE IMPLANT N/A 05/15/2019   Procedure: CYSTOSCOPY MACROPLASTIQUE IMPLANT;  Surgeon: Orson ApeWolff, Michael R, MD;  Location: [facility] ORS;  Service: Urology;  Laterality: N/A;  . DILATATION & CURETTAGE/HYSTEROSCOPY WITH MYOSURE N/A 03/30/2017   Procedure: DILATATION & CURETTAGE/HYSTEROSCOPY WITH MYOSURE;  Surgeon: Schermerhorn, Ihor Austinhomas J, MD;  Location: [facility] ORS;  Service: Gynecology;  Laterality: N/A;  . EYE SURGERY Bilateral    Cataract Extraction with IOL  . SKIN LESION EXCISION    . TONSILLECTOMY AND ADENOIDECTOMY     Age 79   Family History  Problem Relation Age of Onset  . Rheum arthritis Mother   . Ovarian cancer Sister   . COPD Sister   . CAD Sister   . Heart disease Father   . Aneurysm Father   . Cancer Brother   . Thrombosis Maternal Grandmother        a. DVT  . Thrombosis Maternal Grandfather        a. DVT  . Thrombosis Paternal Grandmother        a. DVT  . Thrombosis Paternal Grandfather        a. DVT   Social History   Socioeconomic History  . Marital status: Married    Spouse name: Not on file  . Number of children: Not on file  . Years of education: Not on file  . Highest education level: Not on file  Occupational History  . Not on file  Tobacco Use  . Smoking status: Former Smoker    Packs/day: 0.50    Years: 15.00    Pack years: 7.50    Types: Cigarettes    Quit date: 08/07/1976    Years since quitting: 43.7  . Smokeless tobacco: Never Used  Vaping Use  . Vaping Use: Never used  Substance and Sexual Activity  . Alcohol use: Yes    Comment: ocassionally  . Drug use: No  . Sexual activity: Not on file  Other Topics Concern  . Not on file  Social History Narrative   Married    Social  Determinants of Health   Financial Resource Strain: Low Risk   . Difficulty of Paying Living Expenses: Not hard at all  Food Insecurity: No Food Insecurity  . Worried About Programme researcher, broadcasting/film/videounning Out of Food in the Last Year: Never true  . Ran Out of Food in the Last Year: Never true  Transportation Needs: No Transportation Needs  . Lack of Transportation (Medical): No  . Lack of Transportation (Non-Medical): No  Physical Activity:   . Days of Exercise per Week: Not on file  . Minutes of Exercise per Session: Not on file  Stress: No Stress Concern Present  . Feeling of Stress : Not  at all  Social Connections:   . Frequency of Communication with Friends and Family: Not on file  . Frequency of Social Gatherings with Friends and Family: Not on file  . Attends Religious Services: Not on file  . Active Member of Clubs or Organizations: Not on file  . Attends Banker Meetings: Not on file  . Marital Status: Not on file    Tobacco Counseling Counseling given: Not Answered   Clinical Intake:  Pre-visit preparation completed: Yes        Diabetes: No  How often do you need to have someone help you when you read instructions, pamphlets, or other written materials from your doctor or pharmacy?: 1 - Never  Interpreter Needed?: No      Activities of Daily Living In your present state of health, do you have any difficulty performing the following activities: 05/04/2020 05/12/2019  Hearing? N N  Vision? N N  Difficulty concentrating or making decisions? N N  Walking or climbing stairs? N N  Dressing or bathing? N N  Doing errands, shopping? N N  Preparing Food and eating ? N -  Using the Toilet? N -  In the past six months, have you accidently leaked urine? Y -  Comment Stress inct. -  Do you have problems with loss of bowel control? N -  Managing your Medications? N -  Managing your Finances? N -  Housekeeping or managing your Housekeeping? N -  Some recent data might be  hidden    Patient Care Team: Glori Luis, MD as PCP - General (Family Medicine) Iran Ouch, MD as PCP - Cardiology (Cardiology)  Indicate any recent Medical Services you may have received from other than [facility] providers in the past year (date may be approximate).     Assessment:   This is a routine wellness examination for Ashley [name].  I connected with Ashley [name] today by telephone and verified that I am speaking with the correct person using two identifiers. Location patient: home Location provider: work Persons participating in the virtual visit: patient, Engineer, civil (consulting).    I discussed the limitations, risks, security and privacy concerns of performing an evaluation and management service by telephone and the availability of in person appointments. I also discussed with the patient that there may be a patient responsible charge related to this service. The patient expressed understanding and verbally consented to this telephonic visit.    Interactive audio and video telecommunications were attempted between this provider and patient, however failed, due to patient having technical difficulties OR patient did not have access to video capability.  We continued and completed visit with audio only.  Some vital signs may be absent or patient reported.   Hearing/Vision screen  Hearing Screening   125Hz  250Hz  500Hz  1000Hz  2000Hz  3000Hz  4000Hz  6000Hz  8000Hz   Right ear:           Left ear:           Comments: Patient is able to hear conversational tones without difficulty. No issues reported.   Vision Screening Comments: Wears corrective lenses Visual acuity not assessed, virtual visit.  They have seen their ophthalmologist in the last 12 months.   Dietary issues and exercise activities discussed: Current Exercise Habits: The patient does not participate in regular exercise at present, Intensity: Mild  Healthy diet  Goals    . Follow up with primary care provider as needed     Water  aerobics      Depression Screen  PHQ 2/9 Scores 05/04/2020 04/30/2019 04/25/2019 03/14/2019 04/19/2018 03/11/2018 01/24/2018  PHQ - 2 Score 0 0 0 0 0 0 0  PHQ- 9 Score - - - - - - -    Fall Risk Fall Risk  05/04/2020 04/30/2019 04/25/2019 03/14/2019 04/19/2018  Falls in the past year? 0 0 0 0 No  Number falls in past yr: 0 0 - 0 -  Follow up Falls evaluation completed Falls evaluation completed - - -   Handrails in use when climbing stairs? Yes Home free of loose throw rugs in walkways, pet beds, electrical cords, etc? Yes  Adequate lighting in your home to reduce risk of falls? Yes   ASSISTIVE DEVICES UTILIZED TO PREVENT FALLS: Use of a cane, walker or w/c? No  Grab bars in the bathroom? No  Shower chair or bench in shower? Yes  Elevated toilet seat or a handicapped toilet? No   TIMED UP AND GO: Was the test performed? No . Virtual visit.   Cognitive Function:  Patient is alert and oriented x3.  Denies difficulty focusing, making decisions, memory loss.    6CIT Screen 04/25/2019 04/19/2018  What Year? 0 points 0 points  What month? 0 points 0 points  What time? 0 points 0 points  Count back from 20 0 points 0 points  Months in reverse 0 points 0 points  Repeat phrase 0 points 0 points  Total Score 0 0    Immunizations Immunization History  Administered Date(s) Administered  . Influenza, High Dose Seasonal PF 04/19/2018, 04/16/2019, 04/16/2019  . Influenza-Unspecified 05/17/2016, 05/17/2017  . PFIZER SARS-COV-2 Vaccination 08/29/2019, 09/19/2019  . Zoster Recombinat (Shingrix) 03/28/2018, 07/22/2018    Health Maintenance  Topic Date Due  . Hepatitis C Screening  Never done  . INFLUENZA VACCINE  03/07/2020  . TETANUS/TDAP  08/07/2021  . DEXA SCAN  Completed  . COVID-19 Vaccine  Completed  . PNA vac Low Risk Adult  Completed    Health Maintenance Health Maintenance Due  Topic Date Due  . Hepatitis C Screening  Never done  . INFLUENZA VACCINE  03/07/2020    Dental  Screening: Recommended annual dental exams for proper oral hygiene.  Community Resource Referral / Chronic Care Management: CRR required this visit?  No   CCM required this visit?  No      Plan:    Keep all routine maintenance appointments.   Follow up 08/04/20 @ 2:15  I have personally reviewed and noted the following in the patient's chart:   . Medical and social history . Use of alcohol, tobacco or illicit drugs  . Current medications and supplements . Functional ability and status . Nutritional status . Physical activity . Advanced directives . List of other physicians . Hospitalizations, surgeries, and ER visits in previous 12 months . Vitals . Screenings to include cognitive, depression, and falls . Referrals and appointments  In addition, I have reviewed and discussed with patient certain preventive protocols, quality metrics, and best practice recommendations. A written personalized care plan for preventive services as well as general preventive health recommendations were provided to patient via mychart.     Ashok Pall, LPN   0/11/5995

## 2020-05-06 ENCOUNTER — Other Ambulatory Visit: Payer: Self-pay | Admitting: Internal Medicine

## 2020-05-24 ENCOUNTER — Other Ambulatory Visit: Payer: Self-pay | Admitting: Family Medicine

## 2020-06-14 ENCOUNTER — Other Ambulatory Visit: Payer: Self-pay | Admitting: Family Medicine

## 2020-06-14 DIAGNOSIS — G2581 Restless legs syndrome: Secondary | ICD-10-CM

## 2020-06-22 ENCOUNTER — Other Ambulatory Visit: Payer: Self-pay | Admitting: Family Medicine

## 2020-07-23 ENCOUNTER — Telehealth (INDEPENDENT_AMBULATORY_CARE_PROVIDER_SITE_OTHER): Payer: Medicare HMO | Admitting: Family

## 2020-07-23 ENCOUNTER — Encounter: Payer: Self-pay | Admitting: Family

## 2020-07-23 VITALS — BP 137/88 | Ht 59.0 in | Wt 154.0 lb

## 2020-07-23 DIAGNOSIS — I10 Essential (primary) hypertension: Secondary | ICD-10-CM

## 2020-07-23 DIAGNOSIS — J40 Bronchitis, not specified as acute or chronic: Secondary | ICD-10-CM | POA: Diagnosis not present

## 2020-07-23 MED ORDER — BENZONATATE 100 MG PO CAPS: 100.0000 mg | ORAL_CAPSULE | Freq: Three times a day (TID) | ORAL | 1 refills | Status: DC | PRN

## 2020-07-23 NOTE — Assessment & Plan Note (Signed)
Slightly elevated. Advised to avoid all decongestants and to keep a BP log at home ahead of follow up next week with PCP. Continue hctz 25mg 

## 2020-07-23 NOTE — Progress Notes (Signed)
Verbal consent for services obtained from patient prior to services given to TELEPHONE visit:   Location of call:  provider at work patient at home  Names of all persons present for services: Ashley Plowman, NP and patient  Acute visit : productive cough x 4 days ago, unchanged.  Coughing worse at night.  No sinus pressure, ear pain, fever, diarrhea, vomiting, lost of taste/ smell, sob, cp.  Endorses hoarseness.  Taking mucinex-dm and otc cough syrup.  Former smoker Fully vaccinated.   HTN- compliant with hctz 25mg .   Appt with Dr 08/04/20  Today's Vitals   07/23/20 0826  BP: 137/88  Weight: 154 lb (69.9 kg)  Height: 4\' 11"  (1.499 m)   Body mass index is 31.1 kg/m. BP Readings from Last 3 Encounters:  07/23/20 137/88  02/03/20 130/70  12/24/19 (!) 147/79   06/24/20 136/87   A/P/next steps:  Problem List Items Addressed This Visit      Cardiovascular and Mediastinum   Essential hypertension    Slightly elevated. Advised to avoid all decongestants and to keep a BP log at home ahead of follow up next week with PCP. Continue hctz 25mg         Respiratory   Bronchitis - Primary    Afebrile. No acute respiratory distress. Suspect viral bronchitis based on duration of symptoms. Advised PCR test for accuracy and she will get at 12/26/19 in town and call to let 06/26/20 know the result. We have discussed monoclonal antibody infusion and I will make that call for patient if positive. Patient will continue mucinex dm and start tessalon perles. Advised to go to ED if she develops SOB.       Relevant Medications   benzonatate (TESSALON) 100 MG capsule       I spent 18 min  discussing plan of care over the phone discussion how to test for COVID, treatment as well as how to monitor blood pressure.

## 2020-07-23 NOTE — Assessment & Plan Note (Signed)
Afebrile. No acute respiratory distress. Suspect viral bronchitis based on duration of symptoms. Advised PCR test for accuracy and she will get at [employer] in town and call to let us know the result. We have discussed monoclonal antibody infusion and I will make that call for patient if positive. Patient will continue mucinex dm and start tessalon perles. Advised to go to ED if she develops SOB.

## 2020-07-26 ENCOUNTER — Telehealth: Payer: Self-pay | Admitting: Family Medicine

## 2020-07-26 DIAGNOSIS — J40 Bronchitis, not specified as acute or chronic: Secondary | ICD-10-CM

## 2020-07-26 MED ORDER — AZITHROMYCIN 250 MG PO TABS: ORAL_TABLET | ORAL | 0 refills | Status: DC

## 2020-07-26 MED ORDER — HYDROCOD POLST-CPM POLST ER 10-8 MG/5ML PO SUER: 5.0000 mL | Freq: Every evening | ORAL | 0 refills | Status: DC | PRN

## 2020-07-26 NOTE — Telephone Encounter (Signed)
Spoke with pt Negative covid Productive cough 6 days, unchanged. Cough worse at night.  No fever, sob.  Taken narcotics in the past.  No antibiotics this year. Takes tramadol rarely.   Declines CXR.  Advised to continue mucinex , plenty of water tussionex prn  Advised NOT to take tramadol while  Using tussionex.  Advised to start zpak in next 1-2 days if no improvement  Ensure to take probiotics while on antibiotics and also for 2 weeks after completion. It is important to re-colonize the gut with good bacteria and also to prevent any diarrheal infections associated with antibiotic use.   I looked up patient on [place] Controlled Substances Reporting System PMP AWARE and saw no activity that raised concern of inappropriate use.

## 2020-07-26 NOTE — Addendum Note (Signed)
Addended by: Allegra Grana on: 07/26/2020 02:35 PM   Modules accepted: Orders

## 2020-07-26 NOTE — Telephone Encounter (Signed)
Please advise 

## 2020-07-26 NOTE — Telephone Encounter (Signed)
Pt saw Claris Che on 12/17 and is still having drainage and a bad cough  Pt wanted to know if something else could be called in

## 2020-08-03 NOTE — Discharge Instructions (Signed)
Instructions after Total Knee Replacement   Ashley [name] P. Ashley [name], Jr., M.D.     Dept. of Orthopaedics & Sports Medicine  [facility]  [address]  [place], [place]  [address]  Phone: [phone]   Fax: [phone]    DIET: Drink plenty of non-alcoholic fluids. Resume your normal diet. Include foods high in fiber.  ACTIVITY:  You may use crutches or a walker with weight-bearing as tolerated, unless instructed otherwise. You may be weaned off of the walker or crutches by your Physical Therapist.  Do NOT place pillows under the knee. Anything placed under the knee could limit your ability to straighten the knee.   Continue doing gentle exercises. Exercising will reduce the pain and swelling, increase motion, and prevent muscle weakness.   Please continue to use the TED compression stockings for 6 weeks. You may remove the stockings at night, but should reapply them in the morning. Do not drive or operate any equipment until instructed.  WOUND CARE:  Continue to use the PolarCare or ice packs periodically to reduce pain and swelling. You may bathe or shower after the staples are removed at the first office visit following surgery.  MEDICATIONS: You may resume your regular medications. Please take the pain medication as prescribed on the medication. Do not take pain medication on an empty stomach. You have been given a prescription for a blood thinner (Lovenox or Coumadin). Please take the medication as instructed. (NOTE: After completing a 2 week course of Lovenox, take one Enteric-coated aspirin once a day. This along with elevation will help reduce the possibility of phlebitis in your operated leg.) Do not drive or drink alcoholic beverages when taking pain medications.  CALL THE OFFICE FOR: Temperature above 101 degrees Excessive bleeding or drainage on the dressing. Excessive swelling, coldness, or paleness of the toes. Persistent nausea and vomiting.  FOLLOW-UP:  You  should have an appointment to return to the office in 10-14 days after surgery. Arrangements have been made for continuation of Physical Therapy (either home therapy or outpatient therapy).   [facility] Department Directory         [email or web address]       [email or web address]          Cardiology  Appointments: [place] - [phone] [place] - [phone]  Endocrinology  Appointments: [place] - [phone] [place] - [phone]  Gastroenterology  Appointments: [place] - [phone] [place] - [phone]        General Surgery   Appointments: [place] - [phone]  Internal Medicine/Family Medicine  Appointments: [place] - [phone] [place] - [phone] [place] - [phone]  Metabolic and Weigh Loss Surgery  Appointments: [place] - [phone]        Neurology  Appointments: [place] - [phone] [place] - [phone]  Neurosurgery  Appointments: [place] - [phone]  Obstetrics & Gynecology  Appointments: [place] - [phone] [place] - [phone]        Pediatrics  Appointments: [place] - [phone] [place] - [phone]  Physiatry  Appointments: [place] -[phone]  Physical Therapy  Appointments: [place] - [phone] [place] - [phone]        Podiatry  Appointments: [place] - [phone] [place] - [phone]  Pulmonology  Appointments: [place] - [phone]  Rheumatology  Appointments:  - [phone]         Location: [facility]  [address] , [place]  [address]  [place] Location: [facility] [address] [place], [place]  [address]  [place] Location: [facility] [address] [place], [place]  [address]    

## 2020-08-04 ENCOUNTER — Ambulatory Visit: Payer: Medicare HMO | Admitting: Family Medicine

## 2020-08-09 ENCOUNTER — Encounter: Payer: Self-pay | Admitting: Family Medicine

## 2020-08-09 ENCOUNTER — Ambulatory Visit: Payer: Medicare HMO | Admitting: Family Medicine

## 2020-08-09 ENCOUNTER — Other Ambulatory Visit: Payer: Self-pay

## 2020-08-09 DIAGNOSIS — J40 Bronchitis, not specified as acute or chronic: Secondary | ICD-10-CM

## 2020-08-09 DIAGNOSIS — M79671 Pain in right foot: Secondary | ICD-10-CM | POA: Diagnosis not present

## 2020-08-09 DIAGNOSIS — E785 Hyperlipidemia, unspecified: Secondary | ICD-10-CM | POA: Diagnosis not present

## 2020-08-09 DIAGNOSIS — I10 Essential (primary) hypertension: Secondary | ICD-10-CM | POA: Diagnosis not present

## 2020-08-09 NOTE — Assessment & Plan Note (Signed)
Adequate control for age.  She will continue hydrochlorothiazide 25 mg once daily.

## 2020-08-09 NOTE — Assessment & Plan Note (Signed)
Much improved.  She had a negative Covid test.  She will monitor for continued improvement.

## 2020-08-09 NOTE — Progress Notes (Signed)
  [name], MD Phone: [phone]  Ashley [name] is a 80 y.o. female who presents today for f/u.  HYPERTENSION  Disease Monitoring  Home BP Monitoring not checking chest pain-no    dyspnea-no Medications  Compliance-taking hydrochlorothiazide 25 mg daily.   Edema-no  HYPERLIPIDEMIA Symptoms Chest pain on exertion: No   Medications: Compliance-taking Crestor right upper quadrant pain-no muscle aches-no  Viral illness: Patient was recently evaluated and treated for an upper respiratory illness.  She was treated with azithromycin.  She notes her symptoms have been improving.  She had a COVID-19 test that was negative.  She feels better overall.  Right foot pain: Patient notes this hurts intermittently when she walks.  Notes it only bothers her when she puts pressure on her foot.  Has been going on 1 to 2 months.  No injury.  She is seeing orthopedics and is going to have a left knee replacement.  Social History   Tobacco Use  Smoking Status Former Smoker  . Packs/day: 0.50  . Years: 15.00  . Pack years: 7.50  . Types: Cigarettes  . Quit date: 08/07/1976  . Years since quitting: 44.0  Smokeless Tobacco Never Used     ROS see history of present illness  Objective  Physical Exam Vitals:   08/09/20 1342  BP: 124/84  Pulse: 87  Temp: 98.2 F (36.8 C)  SpO2: 97%    BP Readings from Last 3 Encounters:  08/09/20 124/84  07/23/20 137/88  02/03/20 130/70   Wt Readings from Last 3 Encounters:  08/09/20 154 lb 9.6 oz (70.1 kg)  07/23/20 154 lb (69.9 kg)  05/04/20 155 lb (70.3 kg)    Physical Exam Constitutional:      General: She is not in acute distress.    Appearance: She is not diaphoretic.  Cardiovascular:     Rate and Rhythm: Normal rate and regular rhythm.     Heart sounds: Normal heart sounds.  Pulmonary:     Effort: Pulmonary effort is normal.     Breath sounds: Normal breath sounds.  Musculoskeletal:        General: No edema.     Comments:  Right foot with no tenderness throughout, no swelling, no warmth, no erythema   Skin:    General: Skin is warm and dry.  Neurological:     Mental Status: She is alert.      Assessment/Plan: Please see individual problem list.  Problem List Items Addressed This Visit    Bronchitis    Much improved.  She had a negative Covid test.  She will monitor for continued improvement.      Essential hypertension    Adequate control for age.  She will continue hydrochlorothiazide 25 mg once daily.      Hyperlipidemia    Continue Crestor 40 mg once daily.      Right foot pain    Possible strain.  Could be related to compensation for her left knee.  No obvious abnormalities on exam.  She will ice the area.         This visit occurred during the SARS-CoV-2 public health emergency.  Safety protocols were in place, including screening questions prior to the visit, additional usage of staff PPE, and extensive cleaning of exam room while observing appropriate contact time as indicated for disinfecting solutions.    [name], MD [facility]

## 2020-08-09 NOTE — Assessment & Plan Note (Signed)
Possible strain.  Could be related to compensation for her left knee.  No obvious abnormalities on exam.  She will ice the area.

## 2020-08-09 NOTE — Assessment & Plan Note (Signed)
Continue Crestor 40 mg once daily. 

## 2020-08-09 NOTE — Patient Instructions (Signed)
Nice to see you. Please ice your foot.  If not improving please let us know.

## 2020-08-10 ENCOUNTER — Other Ambulatory Visit: Payer: Self-pay | Admitting: Family Medicine

## 2020-08-10 ENCOUNTER — Encounter
Admission: RE | Admit: 2020-08-10 | Discharge: 2020-08-10 | Disposition: A | Discharge status: Home / Self Care | Payer: Medicare HMO | Source: Ambulatory Visit | Attending: Orthopedic Surgery | Admitting: Orthopedic Surgery

## 2020-08-10 DIAGNOSIS — Z01818 Encounter for other preprocedural examination: Secondary | ICD-10-CM | POA: Diagnosis present

## 2020-08-10 HISTORY — DX: Carpal tunnel syndrome, left upper limb: G56.02

## 2020-08-10 LAB — URINALYSIS, ROUTINE W REFLEX MICROSCOPIC
Bilirubin Urine: NEGATIVE
Glucose, UA: NEGATIVE mg/dL
Ketones, ur: NEGATIVE mg/dL
Nitrite: POSITIVE — AB
Protein, ur: NEGATIVE mg/dL
Specific Gravity, Urine: 1.016 (ref 1.005–1.030)
WBC, UA: >50 WBC/hpf — ABNORMAL HIGH (ref 0–5)
pH: 5 (ref 5.0–8.0)

## 2020-08-10 LAB — TYPE AND SCREEN
ABO/RH(D): O POS
Antibody Screen: NEGATIVE

## 2020-08-10 LAB — CBC
HCT: 44.7 % (ref 36.0–46.0)
Hemoglobin: 14.7 g/dL (ref 12.0–15.0)
MCH: 29.8 pg (ref 26.0–34.0)
MCHC: 32.9 g/dL (ref 30.0–36.0)
MCV: 90.5 fL (ref 80.0–100.0)
Platelets: 316 10*3/uL (ref 150–400)
RBC: 4.94 MIL/uL (ref 3.87–5.11)
RDW: 14.5 % (ref 11.5–15.5)
WBC: 7.3 10*3/uL (ref 4.0–10.5)
nRBC: 0 % (ref 0.0–0.2)

## 2020-08-10 LAB — PROTIME-INR
INR: 1 (ref 0.8–1.2)
Prothrombin Time: 12.4 seconds (ref 11.4–15.2)

## 2020-08-10 LAB — APTT: aPTT: 31 seconds (ref 24–36)

## 2020-08-10 LAB — COMPREHENSIVE METABOLIC PANEL
ALT: 22 U/L (ref 0–44)
AST: 37 U/L (ref 15–41)
Albumin: 4.2 g/dL (ref 3.5–5.0)
Alkaline Phosphatase: 53 U/L (ref 38–126)
Anion gap: 12 (ref 5–15)
BUN: 16 mg/dL (ref 8–23)
CO2: 25 mmol/L (ref 22–32)
Calcium: 9.5 mg/dL (ref 8.9–10.3)
Chloride: 101 mmol/L (ref 98–111)
Creatinine, Ser: 0.87 mg/dL (ref 0.44–1.00)
GFR, Estimated: >60 mL/min (ref >60)
Glucose, Bld: 109 mg/dL — ABNORMAL HIGH (ref 70–99)
Potassium: 3.5 mmol/L (ref 3.5–5.1)
Sodium: 138 mmol/L (ref 135–145)
Total Bilirubin: 0.6 mg/dL (ref 0.3–1.2)
Total Protein: 7.1 g/dL (ref 6.5–8.1)

## 2020-08-10 LAB — SURGICAL PCR SCREEN
MRSA, PCR: NEGATIVE
Staphylococcus aureus: NEGATIVE

## 2020-08-10 LAB — C-REACTIVE PROTEIN: CRP: 0.7 mg/dL (ref <1.0)

## 2020-08-10 LAB — SEDIMENTATION RATE: Sed Rate: 3 mm/hr (ref 0–30)

## 2020-08-10 NOTE — Progress Notes (Addendum)
  [facility] Perioperative Services: Pre-Admission/Anesthesia Testing  Abnormal Lab Notification   Date: 08/10/20  Name: Ashley [name] MRN:   [id number]  Re: Abnormal labs noted during PAT appointment   Provider(s) Notified: Donato Heinz, MD and Lasandra Beech, PA-C Notification mode: Routed and/or faxed via [facility]   ABNORMAL LAB VALUE(S): Lab Results  Component Value Date   COLORURINE YELLOW (A) 08/10/2020   APPEARANCEUR CLOUDY (A) 08/10/2020   LABSPEC 1.016 08/10/2020   PHURINE 5.0 08/10/2020   GLUCOSEU NEGATIVE 08/10/2020   HGBUR SMALL (A) 08/10/2020   BILIRUBINUR NEGATIVE 08/10/2020   KETONESUR NEGATIVE 08/10/2020   PROTEINUR NEGATIVE 08/10/2020   UROBILINOGEN 0.2 11/21/2019   NITRITE POSITIVE (A) 08/10/2020   LEUKOCYTESUR MODERATE (A) 08/10/2020   EPIU 0-5 08/10/2020   WBCU >50 (H) 08/10/2020   RBCU 6-10 08/10/2020   BACTERIA MANY (A) 08/10/2020   Notes:  Patient scheduled for COMPUTER ASSISTED TOTAL KNEE ARTHROPLASTY (Left Knee) on 08/18/2020.    UA performed in PAT consistent with/concerning for infection.  . No leukocytosis noted on CBC . Renal function normal. Estimated Creatinine Clearance: 45 mL/min (by C-G formula based on SCr of 0.87 mg/dL). . Urine C&S added to assess for pathogenically significant growth.  Will forward UA, and subsequent C&S results, to attending surgeon for review and Tx as deemed appropriate. This is a Personal assistant; no formal response is required.  Quentin Mulling, MSN, APRN, FNP-C, CEN [facility]  Peri-operative Services Nurse Practitioner Phone: [phone] Fax: [phone] 08/10/20 1:06 PM

## 2020-08-10 NOTE — Patient Instructions (Signed)
Your procedure is scheduled on:August 18, 2020 Report to the Registration Desk on the 1st floor of the [facility]. To find out your arrival time, please call [phone] between 1PM - 3PM on: Tuesday August 17, 2020  REMEMBER: Instructions that are not followed completely may result in serious medical risk, up to and including death; or upon the discretion of your surgeon and anesthesiologist your surgery may need to be rescheduled.  Do not eat food after midnight the night before surgery.  No gum chewing, lozengers or hard candies.  You may however, drink CLEAR liquids up to 2 hours before you are scheduled to arrive for your surgery. Do not drink anything within 2 hours of your scheduled arrival time.  Clear liquids include: - water  - apple juice without pulp - black coffee or tea (Do NOT add milk or creamers to the coffee or tea) Do NOT drink anything that is not on this list.  Type 1 and Type 2 diabetics should only drink water.  In addition, your doctor has ordered for you to drink the provided  Ensure Pre-Surgery Clear Carbohydrate Drink  Drinking this carbohydrate drink up to two hours before surgery helps to reduce insulin resistance and improve patient outcomes. Please complete drinking 2 hours prior to scheduled arrival time.  TAKE THESE MEDICATIONS THE MORNING OF SURGERY WITH A SIP OF WATER: VENLAFAXINE OMEPRAZOLE (take one the night before and one on the morning of surgery - helps to prevent nausea after surgery.)  Follow recommendations from Cardiologist, Pulmonologist or PCP regarding stopping Aspirin, Coumadin, Plavix, Eliquis, Pradaxa, or Pletal. STOP ASPIRIN LAST DOSE 08/10/2020  One week prior to surgery: Stop Anti-inflammatories (NSAIDS) such as CELEBREX Advil, Aleve, Ibuprofen, Motrin, Naproxen, Naprosyn and Aspirin based products such as Excedrin, Goodys Powder, BC Powder.  USE TYLENOL IF NEEDED Stop ANY OVER THE COUNTER supplements until after surgery.  STOP MELATONIN (However, you may continue taking Vitamin D, Vitamin B, and multivitamin up until the day before surgery.)  No Alcohol for 24 hours before or after surgery.  No Smoking including e-cigarettes for 24 hours prior to surgery.  No chewable tobacco products for at least 6 hours prior to surgery.  No nicotine patches on the day of surgery.  Do not use any "recreational" drugs for at least a week prior to your surgery.  Please be advised that the combination of cocaine and anesthesia may have negative outcomes, up to and including death. If you test positive for cocaine, your surgery will be cancelled.  On the morning of surgery brush your teeth with toothpaste and water, you may rinse your mouth with mouthwash if you wish. Do not swallow any toothpaste or mouthwash.  Do not wear jewelry, make-up, hairpins, clips or nail polish.  Do not wear lotions, powders, or perfumes OR DEODORANT  Do not shave body from the neck down 48 hours prior to surgery just in case you cut yourself which could leave a site for infection.  Also, freshly shaved skin may become irritated if using the CHG soap.  Contact lenses, hearing aids and dentures may not be worn into surgery.  Do not bring valuables to the hospital. [facility] is not responsible for any missing/lost belongings or valuables.   Use CHG Soap as directed on instruction sheet   Notify your doctor if there is any change in your medical condition (cold, fever, infection).  Wear comfortable clothing (specific to your surgery type) to the hospital.  Plan for stool softeners for  home use; pain medications have a tendency to cause constipation. You can also help prevent constipation by eating foods high in fiber such as fruits and vegetables and drinking plenty of fluids as your diet allows.  After surgery, you can help prevent lung complications by doing breathing exercises.  Take deep breaths and cough every 1-2 hours. Your doctor  may order a device called an Incentive Spirometer to help you take deep breaths. When coughing or sneezing, hold a pillow firmly against your incision with both hands. This is called "splinting." Doing this helps protect your incision. It also decreases belly discomfort.  If you are being admitted to the hospital overnight, YOU MAY BRING A SMALL BAG TO THE HOSPITAL WITH YOU.  If you are being discharged the day of surgery, you will not be allowed to drive home. You will need a responsible adult (18 years or older) to drive you home and stay with you that night.   Please call the Pre-admissions Testing Dept. at [phone] if you have any questions about these instructions.  Visitation Policy:  Patients undergoing a surgery or procedure may have one family member or support person with them as long as that person is not COVID-19 positive or experiencing its symptoms.  That person may remain in the waiting area during the procedure.  Inpatient Visitation:    Visiting hours are 7 a.m. to 8 p.m. Patients will be allowed one visitor. The visitor may change daily. The visitor must pass COVID-19 screenings, use hand sanitizer when entering and exiting the patient's room and wear a mask at all times, including in the patient's room. Patients must also wear a mask when staff or their visitor are in the room. Masking is required regardless of vaccination status. Systemwide, no visitors 17 or younger.

## 2020-08-12 LAB — URINE CULTURE: Culture: >=100000 — AB

## 2020-08-16 ENCOUNTER — Other Ambulatory Visit: Payer: Medicare HMO

## 2020-08-16 ENCOUNTER — Telehealth: Payer: Self-pay | Admitting: Family Medicine

## 2020-08-16 NOTE — Telephone Encounter (Signed)
I called the patient and scheduled her for this wednesday to be seen for UTI synptoms.  Ashley [name],cma

## 2020-08-16 NOTE — Telephone Encounter (Signed)
Patient called in wanted to know if Dr.Sonnenberg would call her something in for urine infection she found out she had a urine infection when she went for pre-op or do she need an appointment

## 2020-08-18 ENCOUNTER — Ambulatory Visit: Payer: Medicare HMO | Admitting: Family Medicine

## 2020-08-18 ENCOUNTER — Inpatient Hospital Stay: Admit: 2020-08-18 | Payer: Medicare HMO | Admitting: Orthopedic Surgery

## 2020-08-18 ENCOUNTER — Encounter: Payer: Self-pay | Admitting: Family Medicine

## 2020-08-18 ENCOUNTER — Other Ambulatory Visit: Payer: Self-pay

## 2020-08-18 VITALS — BP 125/80 | HR 102 | Temp 98.8°F | Ht 59.0 in | Wt 154.2 lb

## 2020-08-18 DIAGNOSIS — R350 Frequency of micturition: Secondary | ICD-10-CM

## 2020-08-18 DIAGNOSIS — N3000 Acute cystitis without hematuria: Secondary | ICD-10-CM

## 2020-08-18 DIAGNOSIS — N390 Urinary tract infection, site not specified: Secondary | ICD-10-CM | POA: Insufficient documentation

## 2020-08-18 LAB — POCT URINALYSIS DIPSTICK
Blood, UA: NEGATIVE
Glucose, UA: NEGATIVE
Nitrite, UA: POSITIVE
Protein, UA: POSITIVE — AB
Spec Grav, UA: 1.015 (ref 1.010–1.025)
Urobilinogen, UA: 4 E.U./dL — AB
pH, UA: 6 (ref 5.0–8.0)

## 2020-08-18 LAB — URINALYSIS, MICROSCOPIC ONLY: RBC / HPF: NONE SEEN (ref 0–?)

## 2020-08-18 SURGERY — ARTHROPLASTY, KNEE, TOTAL, USING IMAGELESS COMPUTER-ASSISTED NAVIGATION
Anesthesia: Choice | Site: Knee | Laterality: Left

## 2020-08-18 MED ORDER — CEPHALEXIN 500 MG PO CAPS
500.0000 mg | ORAL_CAPSULE | Freq: Four times a day (QID) | ORAL | 0 refills | Status: DC
Start: 2020-08-18 — End: 2020-10-07

## 2020-08-18 NOTE — Patient Instructions (Signed)
Nice to see. I sent Keflex in for you to start on for your UTI. We will contact you with your urine culture results. If you have any worsening symptoms please let us know.

## 2020-08-18 NOTE — Addendum Note (Signed)
Addended by: Birdie Sons, Shifra Swartzentruber G on: 08/18/2020 10:40 AM   Modules accepted: Orders

## 2020-08-18 NOTE — Progress Notes (Addendum)
[name], MD Phone: [phone]  Ashley [name] is a 80 y.o. female who presents today for same day visit.   UTI: Dysuria- no, though does report a funny feeling when urinating Frequency- yes  Urgency- yes  Hematuria- no  Abd pain- no  Vaginal d/c- no   Social History   Tobacco Use  Smoking Status Former Smoker  . Packs/day: 0.50  . Years: 15.00  . Pack years: 7.50  . Types: Cigarettes  . Quit date: 08/07/1976  . Years since quitting: 44.0  Smokeless Tobacco Never Used    Current Outpatient Medications on File Prior to Visit  Medication Sig Dispense Refill  . acetaminophen (TYLENOL) 650 MG CR tablet Take 1,300 mg by mouth every 8 (eight) hours as needed for pain.    Marland Kitchen aspirin EC 81 MG tablet Take 81 mg by mouth daily.    Marland Kitchen azelastine (OPTIVAR) 0.05 % ophthalmic solution Place 1 drop into both eyes daily as needed (itching eyes).    . celecoxib (CELEBREX) 200 MG capsule Take 200 mg by mouth 2 (two) times daily.    . cetirizine (ZYRTEC) 10 MG tablet Take 10 mg by mouth 2 (two) times daily.    Marland Kitchen EPINEPHrine (EPIPEN 2-PAK) 0.3 mg/0.3 mL IJ SOAJ injection Inject 0.3 mLs (0.3 mg total) into the muscle as needed for anaphylaxis. 1 each 1  . fluticasone (FLONASE) 50 MCG/ACT nasal spray Place 2 sprays into both nostrils daily. (Patient taking differently: Place 2 sprays into both nostrils at bedtime.) 16 g 0  . gabapentin (NEURONTIN) 100 MG capsule TAKE 1 CAPSULE BY MOUTH AT BEDTIME (Patient taking differently: Take 100 mg by mouth at bedtime.) 90 capsule 1  . hydrochlorothiazide (HYDRODIURIL) 25 MG tablet TAKE 1 TABLET BY MOUTH DAILY (Patient taking differently: Take 25 mg by mouth daily.) 90 tablet 1  . HYDROcodone-acetaminophen (NORCO/VICODIN) 5-325 MG tablet Take 1 tablet by mouth 2 (two) times daily as needed for moderate pain.    . Melatonin 10 MG TABS Take 10 mg by mouth at bedtime.    Marland Kitchen omeprazole (PRILOSEC) 20 MG capsule TAKE 1 CAPSULE BY MOUTH ONCE DAILY (Patient taking  differently: Take 20 mg by mouth daily.) 30 capsule 5  . Polyethyl Glycol-Propyl Glycol (SYSTANE) 0.4-0.3 % SOLN Place 1 drop into both eyes 3 (three) times daily as needed (dry/irritated eyes.).    Marland Kitchen rosuvastatin (CRESTOR) 40 MG tablet TAKE 1 TABLET BY MOUTH DAILY 90 tablet 0  . traMADol (ULTRAM) 50 MG tablet Take 1 mg by mouth every 6 (six) hours as needed for moderate pain.    Marland Kitchen venlafaxine XR (EFFEXOR-XR) 150 MG 24 hr capsule TAKE 1 CAPSULE BY MOUTH EVERY DAY (Patient taking differently: Take 150 mg by mouth daily with breakfast.) 90 capsule 2   No current facility-administered medications on file prior to visit.     ROS see history of present illness  Objective  Physical Exam Vitals:   08/18/20 1017  BP: 125/80  Pulse: (!) 102  Temp: 98.8 F (37.1 C)  SpO2: 95%    BP Readings from Last 3 Encounters:  08/18/20 125/80  08/10/20 135/88  08/09/20 124/84   Wt Readings from Last 3 Encounters:  08/18/20 154 lb 3.2 oz (69.9 kg)  08/10/20 154 lb 8 oz (70.1 kg)  08/09/20 154 lb 9.6 oz (70.1 kg)    Physical Exam Constitutional:      General: She is not in acute distress. Pulmonary:     Effort: Pulmonary effort is normal.  Abdominal:     General: Bowel sounds are normal. There is no distension.     Palpations: Abdomen is soft.     Tenderness: There is no abdominal tenderness. There is no guarding or rebound.  Neurological:     Mental Status: She is alert.      Assessment/Plan: Please see individual problem list.  Problem List Items Addressed This Visit    UTI (urinary tract infection)    Symptoms are concerning for UTI.  Urinalysis confirms this.  We will send urine for culture microscopy.  We will treat with Keflex 500 mg every 6 hours for 5 days.      Relevant Medications   cephALEXin (KEFLEX) 500 MG capsule   Other Relevant Orders   Urine Culture   Urine Microscopic    Other Visit Diagnoses    Urine frequency    -  Primary   Relevant Orders   POCT  Urinalysis Dipstick (Completed)        This visit occurred during the SARS-CoV-2 public health emergency.  Safety protocols were in place, including screening questions prior to the visit, additional usage of staff PPE, and extensive cleaning of exam room while observing appropriate contact time as indicated for disinfecting solutions.    [name], MD [facility] Primary Care [facility]

## 2020-08-18 NOTE — Assessment & Plan Note (Signed)
Symptoms are concerning for UTI.  Urinalysis confirms this.  We will send urine for culture microscopy.  We will treat with Keflex 500 mg every 6 hours for 5 days.

## 2020-08-20 LAB — URINE CULTURE
MICRO NUMBER:: 11409671
SPECIMEN QUALITY:: ADEQUATE

## 2020-09-02 ENCOUNTER — Other Ambulatory Visit: Payer: Self-pay | Admitting: Family Medicine

## 2020-09-07 ENCOUNTER — Ambulatory Visit (INDEPENDENT_AMBULATORY_CARE_PROVIDER_SITE_OTHER): Payer: Medicare HMO | Admitting: Family Medicine

## 2020-09-07 ENCOUNTER — Encounter: Payer: Self-pay | Admitting: Family Medicine

## 2020-09-07 ENCOUNTER — Other Ambulatory Visit: Payer: Self-pay

## 2020-09-07 DIAGNOSIS — S46911A Strain of unspecified muscle, fascia and tendon at shoulder and upper arm level, right arm, initial encounter: Secondary | ICD-10-CM | POA: Insufficient documentation

## 2020-09-07 MED ORDER — BACLOFEN 10 MG PO TABS: 10.0000 mg | ORAL_TABLET | Freq: Three times a day (TID) | ORAL | 0 refills | Status: DC | PRN

## 2020-09-07 NOTE — Progress Notes (Signed)
[name], MD Phone: [phone]  Ashley [name] is a 80 y.o. female who presents today for f/u  Right shoulder pain: Patient notes this started on progressively about a week ago.  Her husband has been falling related to his Parkinson's and she has had to help lift him up.  Notes discomfort in her right shoulder abduction.  Some discomfort into the right trapezius.  Describing it as an aching discomfort that even occurs at rest.  Has been taking tramadol intermittently as well as Tylenol and Celebrex.  Social History   Tobacco Use  Smoking Status Former Smoker  . Packs/day: 0.50  . Years: 15.00  . Pack years: 7.50  . Types: Cigarettes  . Quit date: 08/07/1976  . Years since quitting: 44.1  Smokeless Tobacco Never Used    Current Outpatient Medications on File Prior to Visit  Medication Sig Dispense Refill  . acetaminophen (TYLENOL) 650 MG CR tablet Take 1,300 mg by mouth every 8 (eight) hours as needed for pain.    Marland Kitchen aspirin EC 81 MG tablet Take 81 mg by mouth daily.    Marland Kitchen azelastine (OPTIVAR) 0.05 % ophthalmic solution Place 1 drop into both eyes daily as needed (itching eyes).    . celecoxib (CELEBREX) 200 MG capsule Take 200 mg by mouth 2 (two) times daily.    . cephALEXin (KEFLEX) 500 MG capsule Take 1 capsule (500 mg total) by mouth 4 (four) times daily. 20 capsule 0  . cetirizine (ZYRTEC) 10 MG tablet Take 10 mg by mouth 2 (two) times daily.    Marland Kitchen EPINEPHrine (EPIPEN 2-PAK) 0.3 mg/0.3 mL IJ SOAJ injection Inject 0.3 mLs (0.3 mg total) into the muscle as needed for anaphylaxis. 1 each 1  . fluticasone (FLONASE) 50 MCG/ACT nasal spray Place 2 sprays into both nostrils daily. (Patient taking differently: Place 2 sprays into both nostrils at bedtime.) 16 g 0  . gabapentin (NEURONTIN) 100 MG capsule TAKE 1 CAPSULE BY MOUTH AT BEDTIME (Patient taking differently: Take 100 mg by mouth at bedtime.) 90 capsule 1  . hydrochlorothiazide (HYDRODIURIL) 25 MG tablet TAKE 1 TABLET BY  MOUTH DAILY (Patient taking differently: Take 25 mg by mouth daily.) 90 tablet 1  . HYDROcodone-acetaminophen (NORCO/VICODIN) 5-325 MG tablet Take 1 tablet by mouth 2 (two) times daily as needed for moderate pain.    . Melatonin 10 MG TABS Take 10 mg by mouth at bedtime.    Marland Kitchen omeprazole (PRILOSEC) 20 MG capsule TAKE 1 CAPSULE BY MOUTH ONCE DAILY 30 capsule 5  . Polyethyl Glycol-Propyl Glycol (SYSTANE) 0.4-0.3 % SOLN Place 1 drop into both eyes 3 (three) times daily as needed (dry/irritated eyes.).    Marland Kitchen rosuvastatin (CRESTOR) 40 MG tablet TAKE 1 TABLET BY MOUTH DAILY 90 tablet 0  . traMADol (ULTRAM) 50 MG tablet Take 1 mg by mouth every 6 (six) hours as needed for moderate pain.    Marland Kitchen venlafaxine XR (EFFEXOR-XR) 150 MG 24 hr capsule TAKE 1 CAPSULE BY MOUTH EVERY DAY (Patient taking differently: Take 150 mg by mouth daily with breakfast.) 90 capsule 2   No current facility-administered medications on file prior to visit.     ROS see history of present illness  Objective  Physical Exam Vitals:   09/07/20 1153  BP: 130/70  Pulse: 97  Temp: 98.7 F (37.1 C)  SpO2: 98%    BP Readings from Last 3 Encounters:  09/07/20 130/70  08/18/20 125/80  08/10/20 135/88   Wt Readings from Last 3 Encounters:  09/07/20 156 lb 6.4 oz (70.9 kg)  08/18/20 154 lb 3.2 oz (69.9 kg)  08/10/20 154 lb 8 oz (70.1 kg)    Physical Exam Constitutional:      General: She is not in acute distress.    Appearance: She is not diaphoretic.  Pulmonary:     Effort: Pulmonary effort is normal.  Musculoskeletal:        General: No edema.     Comments: No midline spine tenderness, no midline spine step-off, there is slight right trapezius spasm, no discomfort on passive range of motion right for left shoulder, there is discomfort in the right shoulder on active internal range of motion, negative empty can bilaterally, negative speeds bilaterally  Skin:    General: Skin is warm and dry.  Neurological:     Mental  Status: She is alert.      Assessment/Plan: Please see individual problem list.  Problem List Items Addressed This Visit    Shoulder strain, right, initial encounter    Suspect muscular strain right shoulder.  We will treat conservatively with rest.  She can continue Celebrex.  We will trial a low-dose of baclofen to see if that helps as a muscle relaxer.  I advised that this could make her drowsy.  Discussed not driving if she is drowsy and to let us know if she gets excessively drowsy.  If not improving over the next week she will let us know.  Based on her exam and history I do not believe imaging is needed at this time.      Relevant Medications   baclofen (LIORESAL) 10 MG tablet        This visit occurred during the SARS-CoV-2 public health emergency.  Safety protocols were in place, including screening questions prior to the visit, additional usage of staff PPE, and extensive cleaning of exam room while observing appropriate contact time as indicated for disinfecting solutions.    [name], MD [facility] Primary Care [facility]

## 2020-09-07 NOTE — Assessment & Plan Note (Addendum)
Suspect muscular strain right shoulder.  We will treat conservatively with rest.  She can continue Celebrex.  We will trial a low-dose of baclofen to see if that helps as a muscle relaxer.  I advised that this could make her drowsy.  Discussed not driving if she is drowsy and to let us know if she gets excessively drowsy.  If not improving over the next week she will let us know.  Based on her exam and history I do not believe imaging is needed at this time.

## 2020-09-07 NOTE — Patient Instructions (Signed)
Nice to see you. Please try the baclofen.  This may make you drowsy.  If it does make you drowsy please do not drive. Please try to rest your shoulder as much as you are able to. If it is not improving over the next week please let us know.

## 2020-10-07 ENCOUNTER — Encounter: Payer: Self-pay | Admitting: Internal Medicine

## 2020-10-07 ENCOUNTER — Ambulatory Visit (INDEPENDENT_AMBULATORY_CARE_PROVIDER_SITE_OTHER): Payer: Medicare HMO | Admitting: Internal Medicine

## 2020-10-07 ENCOUNTER — Ambulatory Visit (INDEPENDENT_AMBULATORY_CARE_PROVIDER_SITE_OTHER): Payer: Medicare HMO

## 2020-10-07 ENCOUNTER — Ambulatory Visit: Payer: Medicare HMO | Admitting: Internal Medicine

## 2020-10-07 ENCOUNTER — Other Ambulatory Visit: Payer: Self-pay

## 2020-10-07 VITALS — BP 144/84 | HR 97 | Temp 98.3°F | Ht 59.0 in | Wt 154.8 lb

## 2020-10-07 DIAGNOSIS — M25511 Pain in right shoulder: Secondary | ICD-10-CM | POA: Diagnosis not present

## 2020-10-07 MED ORDER — METHYLPREDNISOLONE ACETATE 40 MG/ML IJ SUSP
40.0000 mg | Freq: Once | INTRAMUSCULAR | Status: AC
  Administered 2020-10-07: 40 mg via INTRAMUSCULAR

## 2020-10-07 NOTE — Patient Instructions (Addendum)
Tylenol and tramadol as needed for shoulder  voltaren gel  If steroid shot wanted directed to shoulder call [facility]  Heat/ice    08/11/2020 Telephone [facility]  [address]  [place], [place] [address]  [phone]  Shari Heritage., MD  [address]  [facility] [place], [place] [address]  [phone] (Work)  [phone] (Fax)     Shoulder Exercises Ask your health care provider which exercises are safe for you. Do exercises exactly as told by your health care provider and adjust them as directed. It is normal to feel mild stretching, pulling, tightness, or discomfort as you do these exercises. Stop right away if you feel sudden pain or your pain gets worse. Do not begin these exercises until told by your health care provider. Stretching exercises External rotation and abduction This exercise is sometimes called corner stretch. This exercise rotates your arm outward (external rotation) and moves your arm out from your body (abduction). 1. Stand in a doorway with one of your feet slightly in front of the other. This is called a staggered stance. If you cannot reach your forearms to the door frame, stand facing a corner of a room. 2. Choose one of the following positions as told by your health care provider: ? Place your hands and forearms on the door frame above your head. ? Place your hands and forearms on the door frame at the height of your head. ? Place your hands on the door frame at the height of your elbows. 3. Slowly move your weight onto your front foot until you feel a stretch across your chest and in the front of your shoulders. Keep your head and chest upright and keep your abdominal muscles tight. 4. Hold for __________ seconds. 5. To release the stretch, shift your weight to your back foot. Repeat __________ times. Complete this exercise __________ times a day.   Extension, standing 1. Stand and hold a broomstick, a  cane, or a similar object behind your back. ? Your hands should be a little wider than shoulder width apart. ? Your palms should face away from your back. 2. Keeping your elbows straight and your shoulder muscles relaxed, move the stick away from your body until you feel a stretch in your shoulders (extension). ? Avoid shrugging your shoulders while you move the stick. Keep your shoulder blades tucked down toward the middle of your back. 3. Hold for __________ seconds. 4. Slowly return to the starting position. Repeat __________ times. Complete this exercise __________ times a day. Range-of-motion exercises Pendulum 1. Stand near a wall or a surface that you can hold onto for balance. 2. Bend at the waist and let your left / right arm hang straight down. Use your other arm to support you. Keep your back straight and do not lock your knees. 3. Relax your left / right arm and shoulder muscles, and move your hips and your trunk so your left / right arm swings freely. Your arm should swing because of the motion of your body, not because you are using your arm or shoulder muscles. 4. Keep moving your hips and trunk so your arm swings in the following directions, as told by your health care provider: ? Side to side. ? Forward and backward. ? In clockwise and counterclockwise circles. 5. Continue each motion for __________ seconds, or for as long as told by your health care provider. 6. Slowly return to the starting position. Repeat __________ times. Complete  this exercise __________ times a day.   Shoulder flexion, standing 1. Stand and hold a broomstick, a cane, or a similar object. Place your hands a little more than shoulder width apart on the object. Your left / right hand should be palm up, and your other hand should be palm down. 2. Keep your elbow straight and your shoulder muscles relaxed. Push the stick up with your healthy arm to raise your left / right arm in front of your body, and then  over your head until you feel a stretch in your shoulder (flexion). ? Avoid shrugging your shoulder while you raise your arm. Keep your shoulder blade tucked down toward the middle of your back. 3. Hold for __________ seconds. 4. Slowly return to the starting position. Repeat __________ times. Complete this exercise __________ times a day.   Shoulder abduction, standing 1. Stand and hold a broomstick, a cane, or a similar object. Place your hands a little more than shoulder width apart on the object. Your left / right hand should be palm up, and your other hand should be palm down. 2. Keep your elbow straight and your shoulder muscles relaxed. Push the object across your body toward your left / right side. Raise your left / right arm to the side of your body (abduction) until you feel a stretch in your shoulder. ? Do not raise your arm above shoulder height unless your health care provider tells you to do that. ? If directed, raise your arm over your head. ? Avoid shrugging your shoulder while you raise your arm. Keep your shoulder blade tucked down toward the middle of your back. 3. Hold for __________ seconds. 4. Slowly return to the starting position. Repeat __________ times. Complete this exercise __________ times a day. Internal rotation 1. Place your left / right hand behind your back, palm up. 2. Use your other hand to dangle an exercise band, a towel, or a similar object over your shoulder. Grasp the band with your left / right hand so you are holding on to both ends. 3. Gently pull up on the band until you feel a stretch in the front of your left / right shoulder. The movement of your arm toward the center of your body is called internal rotation. ? Avoid shrugging your shoulder while you raise your arm. Keep your shoulder blade tucked down toward the middle of your back. 4. Hold for __________ seconds. 5. Release the stretch by letting go of the band and lowering your hands. Repeat  __________ times. Complete this exercise __________ times a day.   Strengthening exercises External rotation 1. Sit in a stable chair without armrests. 2. Secure an exercise band to a stable object at elbow height on your left / right side. 3. Place a soft object, such as a folded towel or a small pillow, between your left / right upper arm and your body to move your elbow about 4 inches (10 cm) away from your side. 4. Hold the end of the exercise band so it is tight and there is no slack. 5. Keeping your elbow pressed against the soft object, slowly move your forearm out, away from your abdomen (external rotation). Keep your body steady so only your forearm moves. 6. Hold for __________ seconds. 7. Slowly return to the starting position. Repeat __________ times. Complete this exercise __________ times a day.   Shoulder abduction 1. Sit in a stable chair without armrests, or stand up. 2. Hold a __________ weight in  your left / right hand, or hold an exercise band with both hands. 3. Start with your arms straight down and your left / right palm facing in, toward your body. 4. Slowly lift your left / right hand out to your side (abduction). Do not lift your hand above shoulder height unless your health care provider tells you that this is safe. ? Keep your arms straight. ? Avoid shrugging your shoulder while you do this movement. Keep your shoulder blade tucked down toward the middle of your back. 5. Hold for __________ seconds. 6. Slowly lower your arm, and return to the starting position. Repeat __________ times. Complete this exercise __________ times a day.   Shoulder extension 1. Sit in a stable chair without armrests, or stand up. 2. Secure an exercise band to a stable object in front of you so it is at shoulder height. 3. Hold one end of the exercise band in each hand. Your palms should face each other. 4. Straighten your elbows and lift your hands up to shoulder height. 5. Step back,  away from the secured end of the exercise band, until the band is tight and there is no slack. 6. Squeeze your shoulder blades together as you pull your hands down to the sides of your thighs (extension). Stop when your hands are straight down by your sides. Do not let your hands go behind your body. 7. Hold for __________ seconds. 8. Slowly return to the starting position. Repeat __________ times. Complete this exercise __________ times a day. Shoulder row 1. Sit in a stable chair without armrests, or stand up. 2. Secure an exercise band to a stable object in front of you so it is at waist height. 3. Hold one end of the exercise band in each hand. Position your palms so that your thumbs are facing the ceiling (neutral position). 4. Bend each of your elbows to a 90-degree angle (right angle) and keep your upper arms at your sides. 5. Step back until the band is tight and there is no slack. 6. Slowly pull your elbows back behind you. 7. Hold for __________ seconds. 8. Slowly return to the starting position. Repeat __________ times. Complete this exercise __________ times a day. Shoulder press-ups 1. Sit in a stable chair that has armrests. Sit upright, with your feet flat on the floor. 2. Put your hands on the armrests so your elbows are bent and your fingers are pointing forward. Your hands should be about even with the sides of your body. 3. Push down on the armrests and use your arms to lift yourself off the chair. Straighten your elbows and lift yourself up as much as you comfortably can. ? Move your shoulder blades down, and avoid letting your shoulders move up toward your ears. ? Keep your feet on the ground. As you get stronger, your feet should support less of your body weight as you lift yourself up. 4. Hold for __________ seconds. 5. Slowly lower yourself back into the chair. Repeat __________ times. Complete this exercise __________ times a day.   Wall push-ups 1. Stand so you are  facing a stable wall. Your feet should be about one arm-length away from the wall. 2. Lean forward and place your palms on the wall at shoulder height. 3. Keep your feet flat on the floor as you bend your elbows and lean forward toward the wall. 4. Hold for __________ seconds. 5. Straighten your elbows to push yourself back to the starting position. Repeat __________  times. Complete this exercise __________ times a day.   This information is not intended to replace advice given to you by your health care provider. Make sure you discuss any questions you have with your health care provider. Document Revised: 11/15/2018 Document Reviewed: 08/23/2018 Elsevier Patient Education  2021 [employer]  Shoulder Range of Motion Exercises Shoulder range of motion (ROM) exercises are done to keep the shoulder moving freely or to increase movement. They are often recommended for people who have shoulder pain or stiffness or who are recovering from a shoulder surgery. Phase 1 exercises When you are able, do this exercise 1-2 times per day for 30-60 seconds in each direction, or as directed by your health care provider. Pendulum exercise To do this exercise while sitting: 6. Sit in a chair or at the edge of your bed with your feet flat on the floor. 7. Let your affected arm hang down in front of you over the edge of the bed or chair. 8. Relax your shoulder, arm, and hand. 9. Rock your body so your arm gently swings in small circles. You can also use your unaffected arm to start the motion. 10. Repeat changing the direction of the circles, swinging your arm left and right, and swinging your arm forward and back. To do this exercise while standing: 5. Stand next to a sturdy chair or table, and hold on to it with your hand on your unaffected side. 6. Bend forward at the waist. 7. Bend your knees slightly. 8. Relax your shoulder, arm, and hand. 9. While keeping your shoulder relaxed, use body motion to  swing your arm in small circles. 10. Repeat changing the direction of the circles, swinging your arm left and right, and swinging your arm forward and back. 11. Between exercises, stand up tall and take a short break to relax your lower back.   Phase 2 exercises Do these exercises 1-2 times per day or as told by your health care provider. Hold each stretch for 30 seconds, and repeat 3 times. Do the exercises with one or both arms as instructed by your health care provider. For these exercises, sit at a table with your hand and arm supported by the table. A chair that slides easily or has wheels can be helpful. External rotation 7. Turn your chair so that your affected side is nearest to the table. 8. Place your forearm on the table to your side. Bend your elbow about 90 at the elbow (right angle) and place your hand palm facing down on the table. Your elbow should be about 6 inches away from your side. 9. Keeping your arm on the table, lean your body forward. Abduction 5. Turn your chair so that your affected side is nearest to the table. 6. Place your forearm and hand on the table so that your thumb points toward the ceiling and your arm is straight out to your side. 7. Slide your hand out to the side and away from you, using your unaffected arm to do the work. 8. To increase the stretch, you can slide your chair away from the table. Flexion: forward stretch 5. Sit facing the table. Place your hand and elbow on the table in front of you. 6. Slide your hand forward and away from you, using your unaffected arm to do the work. 7. To increase the stretch, you can slide your chair backward. Phase 3 exercises Do these exercises 1-2 times per day or as told by your health care  provider. Hold each stretch for 30 seconds, and repeat 3 times. Do the exercises with one or both arms as instructed by your health care provider. Cross-body stretch: posterior capsule stretch 6. Lift your arm straight out in  front of you. 7. Bend your arm 90 at the elbow (right angle) so your forearm moves across your body. 8. Use your other arm to gently pull the elbow across your body, toward your other shoulder. Wall climbs 8. Stand with your affected arm extended out to the side with your hand resting on a door frame. 9. Slide your hand slowly up the door frame. 10. To increase the stretch, step through the door frame. Keep your body upright and do not lean. Wand exercises You will need a cane, a piece of PVC pipe, or a sturdy wooden dowel for wand exercises. Flexion To do this exercise while standing: 7. Hold the wand with both of your hands, palms down. 8. Using the other arm to help, lift your arms up and over your head, if able. 9. Push upward with your other arm to gently increase the stretch. To do this exercise while lying down: 9. Lie on your back with your elbows resting on the floor and the wand in both your hands. Your hands will be palm down, or pointing toward your feet. 10. Lift your hands toward the ceiling, using your unaffected arm to help if needed. 11. Bring your arms overhead as able, using your unaffected arm to help if needed. Internal rotation 9. Stand while holding the wand behind you with both hands. Your unaffected arm should be extended above your head with the arm of the affected side extended behind you at the level of your waist. The wand should be pointing straight up and down as you hold it. 10. Slowly pull the wand up behind your back by straightening the elbow of your unaffected arm and bending the elbow of your affected arm. External rotation 6. Lie on your back with your affected upper arm supported on a small pillow or rolled towel. When you first do this exercise, keep your upper arm close to your body. Over time, bring your arm up to a 90 angle out to the side. 7. Hold the wand across your stomach and with both hands palm up. Your elbow on your affected side should be  bent at a 90 angle. 8. Use your unaffected side to help push your forearm away from you and toward the floor. Keep your elbow on your affected side bent at a 90 angle. Contact a health care provider if you have:  New or increasing pain.  New numbness, tingling, weakness, or discoloration in your arm or hand. This information is not intended to replace advice given to you by your health care provider. Make sure you discuss any questions you have with your health care provider. Document Revised: 09/05/2017 Document Reviewed: 09/05/2017 [employer] Patient Education  2021 [employer]

## 2020-10-07 NOTE — Progress Notes (Signed)
Chief Complaint  Patient presents with  . Shoulder Pain    Right shoulder pain, no know injuries. States her husband falls a lot and she has to help him up. States this is the only thing she could think of to hurt her shoulder    F/u  1. Right shoulder pain 5/10 with ROM above head across body and with lifting on this side and lying she has pain with ROM  2.left knee replacement doing [facility] Dr. Ernest Pine replacement    Review of Systems  Constitutional: Negative for weight loss.  HENT: Negative for hearing loss.   Eyes: Negative for blurred vision.  Respiratory: Negative for shortness of breath.   Cardiovascular: Negative for chest pain.  Gastrointestinal: Negative for abdominal pain.  Musculoskeletal: Positive for joint pain.  Skin: Negative for rash.   Past Medical History:  Diagnosis Date  . Abnormal uterine bleeding (AUB)    a. benign endometrial polyps on D+C in 03/2017  . Anxiety   . Arthritis   . Carpal tunnel syndrome of left wrist   . Chicken pox   . Depression   . GERD (gastroesophageal reflux disease)   . History of stress test    a. 08/2017 Lexiscan MV: EF 55-65%. No ischemia/infarct. Low risk.  Marland Kitchen Hyperlipidemia   . Hypertension   . Lacunar infarction (HCC)    a. Dx in setting of dizziness/vertigo; b. 02/2018 Carotid U/S: bilateral intimal thickening and atherosclerotic plaque. No hemodynamically significant stenoses; c. 03/2018 MRI Brain: No mass or abnl enhancement. Chronic microvascular ischemic changes and mod volume loss of the brain. 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  . Stroke (HCC)    PT STATES SHE WAS TOLD THIS BY HER PCP BUT PT STATES SHE NEVER KNEW SHE HAD A STROKE-NO RESIDUAL EFFECTS  . Vertigo    Past Surgical History:  Procedure Laterality Date  . BACK SURGERY    . CARPAL TUNNEL RELEASE Right 07/30/2019  . COLONOSCOPY WITH PROPOFOL N/A 12/24/2019   Procedure: COLONOSCOPY WITH PROPOFOL;  Surgeon: Toledo, Boykin Nearing, MD;   Location: [facility];  Service: Gastroenterology;  Laterality: N/A;  . CYSTOSCOPY MACROPLASTIQUE IMPLANT N/A 05/15/2019   Procedure: CYSTOSCOPY MACROPLASTIQUE IMPLANT;  Surgeon: Orson Ape, MD;  Location: [facility] ORS;  Service: Urology;  Laterality: N/A;  . DILATATION & CURETTAGE/HYSTEROSCOPY WITH MYOSURE N/A 03/30/2017   Procedure: DILATATION & CURETTAGE/HYSTEROSCOPY WITH MYOSURE;  Surgeon: Schermerhorn, Ihor Austin, MD;  Location: [facility] ORS;  Service: Gynecology;  Laterality: N/A;  . EYE SURGERY Bilateral    Cataract Extraction with IOL  . SKIN LESION EXCISION    . TONSILLECTOMY AND ADENOIDECTOMY     Age 80   Family History  Problem Relation Age of Onset  . Rheum arthritis Mother   . Ovarian cancer Sister   . COPD Sister   . CAD Sister   . Heart disease Father   . Aneurysm Father   . Cancer Brother   . Thrombosis Maternal Grandmother        a. DVT  . Thrombosis Maternal Grandfather        a. DVT  . Thrombosis Paternal Grandmother        a. DVT  . Thrombosis Paternal Grandfather        a. DVT   Social History   Socioeconomic History  . Marital status: Married    Spouse name: Not on file  . Number of children: Not on file  . Years of education: Not on  file  . Highest education level: Not on file  Occupational History  . Not on file  Tobacco Use  . Smoking status: Former Smoker    Packs/day: 0.50    Years: 15.00    Pack years: 7.50    Types: Cigarettes    Quit date: 08/07/1976    Years since quitting: 44.1  . Smokeless tobacco: Never Used  Vaping Use  . Vaping Use: Never used  Substance and Sexual Activity  . Alcohol use: Yes    Comment: ocassionally  . Drug use: No  . Sexual activity: Not on file  Other Topics Concern  . Not on file  Social History Narrative   Married    Social Determinants of Health   Financial Resource Strain: Low Risk   . Difficulty of Paying Living Expenses: Not hard at all  Food Insecurity: No Food Insecurity  . Worried About  Programme researcher, broadcasting/film/video in the Last Year: Never true  . Ran Out of Food in the Last Year: Never true  Transportation Needs: No Transportation Needs  . Lack of Transportation (Medical): No  . Lack of Transportation (Non-Medical): No  Physical Activity: Not on file  Stress: No Stress Concern Present  . Feeling of Stress : Not at all  Social Connections: Not on file  Intimate Partner Violence: Not At Risk  . Fear of Current or Ex-Partner: No  . Emotionally Abused: No  . Physically Abused: No  . Sexually Abused: No   Current Meds  Medication Sig  . acetaminophen (TYLENOL) 650 MG CR tablet Take 1,300 mg by mouth every 8 (eight) hours as needed for pain.  Marland Kitchen aspirin EC 81 MG tablet Take 81 mg by mouth daily.  Marland Kitchen azelastine (OPTIVAR) 0.05 % ophthalmic solution Place 1 drop into both eyes daily as needed (itching eyes).  . celecoxib (CELEBREX) 200 MG capsule Take 200 mg by mouth 2 (two) times daily.  . cetirizine (ZYRTEC) 10 MG tablet Take 10 mg by mouth 2 (two) times daily.  Marland Kitchen EPINEPHrine (EPIPEN 2-PAK) 0.3 mg/0.3 mL IJ SOAJ injection Inject 0.3 mLs (0.3 mg total) into the muscle as needed for anaphylaxis.  . fluticasone (FLONASE) 50 MCG/ACT nasal spray Place 2 sprays into both nostrils daily. (Patient taking differently: Place 2 sprays into both nostrils at bedtime.)  . gabapentin (NEURONTIN) 100 MG capsule TAKE 1 CAPSULE BY MOUTH AT BEDTIME (Patient taking differently: Take 100 mg by mouth at bedtime.)  . hydrochlorothiazide (HYDRODIURIL) 25 MG tablet TAKE 1 TABLET BY MOUTH DAILY (Patient taking differently: Take 25 mg by mouth daily.)  . Melatonin 10 MG TABS Take 10 mg by mouth at bedtime.  Marland Kitchen omeprazole (PRILOSEC) 20 MG capsule TAKE 1 CAPSULE BY MOUTH ONCE DAILY  . Polyethyl Glycol-Propyl Glycol (SYSTANE) 0.4-0.3 % SOLN Place 1 drop into both eyes 3 (three) times daily as needed (dry/irritated eyes.).  Marland Kitchen rosuvastatin (CRESTOR) 40 MG tablet TAKE 1 TABLET BY MOUTH DAILY  . traMADol (ULTRAM) 50 MG  tablet Take 1 mg by mouth every 6 (six) hours as needed for moderate pain.  Marland Kitchen venlafaxine XR (EFFEXOR-XR) 150 MG 24 hr capsule TAKE 1 CAPSULE BY MOUTH EVERY DAY (Patient taking differently: Take 150 mg by mouth daily with breakfast.)  . [DISCONTINUED] HYDROcodone-acetaminophen (NORCO/VICODIN) 5-325 MG tablet Take 1 tablet by mouth 2 (two) times daily as needed for moderate pain.   Allergies  Allergen Reactions  . Ace Inhibitors     Angioedema  . Pravastatin Other (See Comments)  Other reaction(s): Muscle Pain  . Simvastatin Other (See Comments)    stiffness   Recent Results (from the past 2160 hour(s))  C-reactive protein     Status: None   Collection Time: 08/10/20  9:31 AM  Result Value Ref Range   CRP 0.7 <1.0 mg/dL    Comment: Performed at [facility], [address] [place], [place] [address]  Surgical pcr screen     Status: None   Collection Time: 08/10/20  9:31 AM   Specimen: Nasal Mucosa; Nasal Swab  Result Value Ref Range   MRSA, PCR NEGATIVE NEGATIVE   Staphylococcus aureus NEGATIVE NEGATIVE    Comment: (NOTE) The Xpert SA Assay (FDA approved for NASAL specimens in patients 78 years of age and older), is one component of a comprehensive surveillance program. It is not intended to diagnose infection nor to guide or monitor treatment. Performed at [facility], [address] [place], [place] [address]   Sedimentation rate     Status: None   Collection Time: 08/10/20  9:31 AM  Result Value Ref Range   Sed Rate 3 0 - 30 mm/hr    Comment: Performed at [facility], [address] [place], [place] [address]  CBC     Status: None   Collection Time: 08/10/20  9:31 AM  Result Value Ref Range   WBC 7.3 4.0 - 10.5 K/uL   RBC 4.94 3.87 - 5.11 MIL/uL   Hemoglobin 14.7 12.0 - 15.0 g/dL   HCT 91.444.7 78.236.0 - 95.646.0 %   MCV 90.5 80.0 - 100.0 fL   MCH 29.8 26.0 - 34.0 pg   MCHC 32.9 30.0 - 36.0 g/dL   RDW 21.314.5 08.611.5 - 57.815.5 %   Platelets 316 150 - 400  K/uL   nRBC 0.0 0.0 - 0.2 %    Comment: Performed at [facility], [address] [place], [place] [address]  Comprehensive metabolic panel     Status: Abnormal   Collection Time: 08/10/20  9:31 AM  Result Value Ref Range   Sodium 138 135 - 145 mmol/L   Potassium 3.5 3.5 - 5.1 mmol/L   Chloride 101 98 - 111 mmol/L   CO2 25 22 - 32 mmol/L   Glucose, Bld 109 (H) 70 - 99 mg/dL    Comment: Glucose reference range applies only to samples taken after fasting for at least 8 hours.   BUN 16 8 - 23 mg/dL   Creatinine, Ser 9.520.87 0.44 - 1.00 mg/dL   Calcium 9.5 8.9 - 84.110.3 mg/dL   Total Protein 7.1 6.5 - 8.1 g/dL   Albumin 4.2 3.5 - 5.0 g/dL   AST 37 15 - 41 U/L   ALT 22 0 - 44 U/L   Alkaline Phosphatase 53 38 - 126 U/L   Total Bilirubin 0.6 0.3 - 1.2 mg/dL   GFR, Estimated >32>60 >44>60 mL/min    Comment: (NOTE) Calculated using the CKD-EPI Creatinine Equation (2021)    Anion gap 12 5 - 15    Comment: Performed at [facility], [address] [place], [place] [address]  Protime-INR     Status: None   Collection Time: 08/10/20  9:31 AM  Result Value Ref Range   Prothrombin Time 12.4 11.4 - 15.2 seconds   INR 1.0 0.8 - 1.2    Comment: (NOTE) INR goal varies based on device and disease states. Performed at [facility], [address] [place], [place] [address]  APTT     Status: None   Collection Time: 08/10/20  9:31 AM  Result Value Ref Range   aPTT 31 24 - 36 seconds    Comment: Performed at [facility], [address] [place], [place] [address]  Urinalysis, Routine w reflex microscopic     Status: Abnormal   Collection Time: 08/10/20  9:31 AM  Result Value Ref Range   Color, Urine YELLOW (A) YELLOW   APPearance CLOUDY (A) CLEAR   Specific Gravity, Urine 1.016 1.005 - 1.030   pH 5.0 5.0 - 8.0   Glucose, UA NEGATIVE NEGATIVE mg/dL   Hgb urine dipstick SMALL (A) NEGATIVE   Bilirubin Urine NEGATIVE NEGATIVE   Ketones, ur NEGATIVE NEGATIVE  mg/dL   Protein, ur NEGATIVE NEGATIVE mg/dL   Nitrite POSITIVE (A) NEGATIVE   Leukocytes,Ua MODERATE (A) NEGATIVE   RBC / HPF 6-10 0 - 5 RBC/hpf   WBC, UA >50 (H) 0 - 5 WBC/hpf   Bacteria, UA MANY (A) NONE SEEN   Squamous Epithelial / LPF 0-5 0 - 5   Mucus PRESENT     Comment: Performed at [facility], [address] [place], [place] [address]  Urine culture     Status: Abnormal   Collection Time: 08/10/20  9:31 AM   Specimen: Urine, Random  Result Value Ref Range   Specimen Description      URINE, RANDOM Performed at [facility], [address], [place], [place] [address]    Special Requests      NONE Performed at [facility], [address] [place], [place] [address]    Culture >=100,000 COLONIES/mL ESCHERICHIA COLI (A)    Report Status 08/12/2020 FINAL    Organism ID, Bacteria ESCHERICHIA COLI (A)       Susceptibility   Escherichia coli - MIC*    AMPICILLIN >=32 RESISTANT Resistant     CEFAZOLIN <=4 SENSITIVE Sensitive     CEFEPIME <=0.12 SENSITIVE Sensitive     CEFTRIAXONE <=0.25 SENSITIVE Sensitive     CIPROFLOXACIN <=0.25 SENSITIVE Sensitive     GENTAMICIN >=16 RESISTANT Resistant     IMIPENEM <=0.25 SENSITIVE Sensitive     NITROFURANTOIN <=16 SENSITIVE Sensitive     TRIMETH/SULFA >=320 RESISTANT Resistant     AMPICILLIN/SULBACTAM >=32 RESISTANT Resistant     PIP/TAZO <=4 SENSITIVE Sensitive     * >=100,000 COLONIES/mL ESCHERICHIA COLI  Type and screen Order type and screen if day of surgery is less than 15 days from draw of preadmission visit or order morning of surgery if day of surgery is greater than 6 days from preadmission visit.     Status: None   Collection Time: 08/10/20  9:31 AM  Result Value Ref Range   ABO/RH(D) O POS    Antibody Screen NEG    Sample Expiration 08/24/2020,2359    Extend sample reason      NO TRANSFUSIONS OR PREGNANCY IN THE PAST 3 MONTHS Performed at [facility], [address]  [place], [place] [address]   POCT Urinalysis Dipstick     Status: Abnormal   Collection Time: 08/18/20 10:23 AM  Result Value Ref Range   Color, UA yellow    Clarity, UA clear    Glucose, UA Negative Negative   Bilirubin, UA 1+    Ketones, UA 3+    Spec Grav, UA 1.015 1.010 - 1.025   Blood, UA negative    pH, UA 6.0 5.0 - 8.0   Protein,  UA Positive (A) Negative   Urobilinogen, UA 4.0 (A) 0.2 or 1.0 E.U./dL   Nitrite, UA positive    Leukocytes, UA Moderate (2+) (A) Negative   Appearance clear    Odor none   Urine Culture     Status: Abnormal   Collection Time: 08/18/20 10:49 AM   Specimen: Urine  Result Value Ref Range   MICRO NUMBER: [id number]    SPECIMEN QUALITY: Adequate    Sample Source URINE    STATUS: FINAL    ISOLATE 1: Escherichia coli (A)     Comment: Greater than 100,000 CFU/mL of Escherichia coli      Susceptibility   Escherichia coli - URINE CULTURE, REFLEX    AMOX/CLAVULANIC 8 Sensitive     AMPICILLIN >=32 Resistant     AMPICILLIN/SULBACTAM >=32 Resistant     CEFAZOLIN* <=4 Not Reportable      * For infections other than uncomplicated UTIcaused by E. coli, K. pneumoniae or P. mirabilis:Cefazolin is resistant if MIC > or = 8 mcg/mL.(Distinguishing susceptible versus intermediatefor isolates with MIC < or = 4 mcg/mL requiresadditional testing.)For uncomplicated UTI caused by E. coli,K. pneumoniae or P. mirabilis: Cefazolin issusceptible if MIC <32 mcg/mL and predictssusceptible to the oral agents cefaclor, cefdinir,cefpodoxime, cefprozil, cefuroxime, cephalexinand loracarbef.    CEFEPIME <=1 Sensitive     CEFTRIAXONE <=1 Sensitive     CIPROFLOXACIN <=0.25 Sensitive     LEVOFLOXACIN <=0.12 Sensitive     ERTAPENEM <=0.5 Sensitive     GENTAMICIN >=16 Resistant     IMIPENEM <=0.25 Sensitive     NITROFURANTOIN <=16 Sensitive     PIP/TAZO <=4 Sensitive     TOBRAMYCIN 8 Intermediate     TRIMETH/SULFA* >=320 Resistant      * For infections other than uncomplicated UTIcaused  by E. coli, K. pneumoniae or P. mirabilis:Cefazolin is resistant if MIC > or = 8 mcg/mL.(Distinguishing susceptible versus intermediatefor isolates with MIC < or = 4 mcg/mL requiresadditional testing.)For uncomplicated UTI caused by E. coli,K. pneumoniae or P. mirabilis: Cefazolin issusceptible if MIC <32 mcg/mL and predictssusceptible to the oral agents cefaclor, cefdinir,cefpodoxime, cefprozil, cefuroxime, cephalexinand loracarbef.Legend:S = Susceptible  I = IntermediateR = Resistant  NS = Not susceptible* = Not tested  NR = Not reported**NN = See antimicrobic comments  Urine Microscopic     Status: Abnormal   Collection Time: 08/18/20 10:49 AM  Result Value Ref Range   WBC, UA 21-50/hpf (A) 0-2/hpf   RBC / HPF none seen 0-2/hpf   Squamous Epithelial / LPF Rare(0-4/hpf) Rare(0-4/hpf)   Bacteria, UA Many(>50/hpf) (A) None   Objective  Body mass index is 31.27 kg/m. Wt Readings from Last 3 Encounters:  10/07/20 154 lb 12.8 oz (70.2 kg)  09/07/20 156 lb 6.4 oz (70.9 kg)  08/18/20 154 lb 3.2 oz (69.9 kg)   Temp Readings from Last 3 Encounters:  10/07/20 98.3 F (36.8 C) (Oral)  09/07/20 98.7 F (37.1 C) (Oral)  08/18/20 98.8 F (37.1 C) (Oral)   BP Readings from Last 3 Encounters:  10/07/20 (!) 144/84  09/07/20 130/70  08/18/20 125/80   Pulse Readings from Last 3 Encounters:  10/07/20 97  09/07/20 97  08/18/20 (!) 102    Physical Exam Vitals and nursing note reviewed.  Constitutional:      Appearance: Normal appearance. She is well-developed and well-groomed.  HENT:     Head: Normocephalic and atraumatic.  Cardiovascular:     Rate and Rhythm: Normal rate and regular rhythm.     Heart sounds:  No murmur heard.   Pulmonary:     Effort: Pulmonary effort is normal.     Breath sounds: Normal breath sounds.  Musculoskeletal:     Right shoulder: Tenderness present. Decreased range of motion.  Skin:    General: Skin is warm and dry.  Neurological:     General: No focal  deficit present.     Mental Status: She is alert and oriented to person, place, and time. Mental status is at baseline.     Gait: Gait normal.  Psychiatric:        Attention and Perception: Attention and perception normal.        Mood and Affect: Mood and affect normal.        Speech: Speech normal.        Behavior: Behavior normal. Behavior is cooperative.        Thought Content: Thought content normal.        Cognition and Memory: Cognition and memory normal.        Judgment: Judgment normal.     Assessment  Plan  Acute pain of right shoulder ? Arthritis vs rotator cuff- Plan: DG Shoulder Right, methylPREDNISolone acetate (DEPO-MEDROL) injection 40 mg  F/u [facility] Pt and MD visit if directed steroid injection needed  Tylenol and tramadol as needed for shoulder  voltaren gel  If steroid shot wanted directed to shoulder call [facility]  Heat/ice   Provider: Dr. French Ana McLean-Scocuzza-Internal Medicine

## 2020-10-08 ENCOUNTER — Telehealth: Payer: Self-pay | Admitting: Family Medicine

## 2020-10-08 NOTE — Telephone Encounter (Signed)
Ashley [name], CMA  10/08/2020 3:13 PM EST      Patient has seen results on mychart. Left message to return call with any further questions.   Faxed    Ashley Spillers McLean-Scocuzza, MD  10/08/2020 8:00 AM EST      Fax to Dr. Ernest Pine  Severe arthritis right shoulder needs to f/u [facility] ortho call for appt she is established

## 2020-10-08 NOTE — Telephone Encounter (Signed)
Patient called in and was informed  

## 2020-11-01 ENCOUNTER — Other Ambulatory Visit: Payer: Self-pay | Admitting: Family Medicine

## 2020-11-20 NOTE — Discharge Instructions (Signed)
Instructions after Total Knee Replacement   Aveya Beal P. Chevie Birkhead, Jr., M.D.     Dept. of Orthopaedics & Sports Medicine  [facility]  [address]  [place], [place]  [address]  Phone: [phone]   Fax: [phone]    DIET: Drink plenty of non-alcoholic fluids. Resume your normal diet. Include foods high in fiber.  ACTIVITY:  You may use crutches or a walker with weight-bearing as tolerated, unless instructed otherwise. You may be weaned off of the walker or crutches by your Physical Therapist.  Do NOT place pillows under the knee. Anything placed under the knee could limit your ability to straighten the knee.   Continue doing gentle exercises. Exercising will reduce the pain and swelling, increase motion, and prevent muscle weakness.   Please continue to use the TED compression stockings for 6 weeks. You may remove the stockings at night, but should reapply them in the morning. Do not drive or operate any equipment until instructed.  WOUND CARE:  Continue to use the PolarCare or ice packs periodically to reduce pain and swelling. You may bathe or shower after the staples are removed at the first office visit following surgery.  MEDICATIONS: You may resume your regular medications. Please take the pain medication as prescribed on the medication. Do not take pain medication on an empty stomach. You have been given a prescription for a blood thinner (Lovenox or Coumadin). Please take the medication as instructed. (NOTE: After completing a 2 week course of Lovenox, take one Enteric-coated aspirin once a day. This along with elevation will help reduce the possibility of phlebitis in your operated leg.) Do not drive or drink alcoholic beverages when taking pain medications.  CALL THE OFFICE FOR: Temperature above 101 degrees Excessive bleeding or drainage on the dressing. Excessive swelling, coldness, or paleness of the toes. Persistent nausea and vomiting.  FOLLOW-UP:  You  should have an appointment to return to the office in 10-14 days after surgery. Arrangements have been made for continuation of Physical Therapy (either home therapy or outpatient therapy).   [facility] Department Directory         [email or web address]       [email or web address]          Cardiology  Appointments: [place] - [phone] [place] - [phone]  Endocrinology  Appointments: [place] - [phone] [place] - [phone]  Gastroenterology  Appointments: [place] - [phone] [place] - [phone]        General Surgery   Appointments: [place] - [phone]  Internal Medicine/Family Medicine  Appointments: [place] - [phone] [place] - [phone] [place] - [phone]  Metabolic and Weigh Loss Surgery  Appointments: [place] - [phone]        Neurology  Appointments: [place] - [phone] [place] - [phone]  Neurosurgery  Appointments: [place] - [phone]  Obstetrics & Gynecology  Appointments: [place] - [phone] [place] - [phone]        Pediatrics  Appointments: [place] - [phone] [place] - [phone]  Physiatry  Appointments: [place] -[phone]  Physical Therapy  Appointments: [place] - [phone] [place] - [phone]        Podiatry  Appointments: [place] - [phone] [place] - [phone]  Pulmonology  Appointments: [place] - [phone]  Rheumatology  Appointments: [place] - [phone]        [place] Location: [facility]  [address] [place], [place]  [address]  [place] Location: [facility] [address] [place], [place]  [address]  [place] Location: [facility] [address] [place], [place]  [address]    

## 2020-11-22 ENCOUNTER — Other Ambulatory Visit: Payer: Self-pay

## 2020-11-22 ENCOUNTER — Encounter
Admission: RE | Admit: 2020-11-22 | Discharge: 2020-11-22 | Disposition: A | Discharge status: Home / Self Care | Payer: Medicare HMO | Source: Ambulatory Visit | Attending: Orthopedic Surgery | Admitting: Orthopedic Surgery

## 2020-11-22 DIAGNOSIS — Z01818 Encounter for other preprocedural examination: Secondary | ICD-10-CM | POA: Diagnosis not present

## 2020-11-22 LAB — COMPREHENSIVE METABOLIC PANEL
ALT: 34 U/L (ref 0–44)
AST: 36 U/L (ref 15–41)
Albumin: 4.7 g/dL (ref 3.5–5.0)
Alkaline Phosphatase: 55 U/L (ref 38–126)
Anion gap: 11 (ref 5–15)
BUN: 14 mg/dL (ref 8–23)
CO2: 27 mmol/L (ref 22–32)
Calcium: 9.4 mg/dL (ref 8.9–10.3)
Chloride: 98 mmol/L (ref 98–111)
Creatinine, Ser: 0.78 mg/dL (ref 0.44–1.00)
GFR, Estimated: >60 mL/min (ref >60)
Glucose, Bld: 100 mg/dL — ABNORMAL HIGH (ref 70–99)
Potassium: 3.8 mmol/L (ref 3.5–5.1)
Sodium: 136 mmol/L (ref 135–145)
Total Bilirubin: 0.7 mg/dL (ref 0.3–1.2)
Total Protein: 7.5 g/dL (ref 6.5–8.1)

## 2020-11-22 LAB — URINALYSIS, ROUTINE W REFLEX MICROSCOPIC
Bacteria, UA: NONE SEEN
Bilirubin Urine: NEGATIVE
Glucose, UA: NEGATIVE mg/dL
Ketones, ur: NEGATIVE mg/dL
Leukocytes,Ua: NEGATIVE
Nitrite: NEGATIVE
Protein, ur: NEGATIVE mg/dL
Specific Gravity, Urine: 1.005 (ref 1.005–1.030)
Squamous Epithelial / HPF: NONE SEEN (ref 0–5)
pH: 6 (ref 5.0–8.0)

## 2020-11-22 LAB — CBC
HCT: 42.6 % (ref 36.0–46.0)
Hemoglobin: 13.9 g/dL (ref 12.0–15.0)
MCH: 30.8 pg (ref 26.0–34.0)
MCHC: 32.6 g/dL (ref 30.0–36.0)
MCV: 94.2 fL (ref 80.0–100.0)
Platelets: 350 10*3/uL (ref 150–400)
RBC: 4.52 MIL/uL (ref 3.87–5.11)
RDW: 14.2 % (ref 11.5–15.5)
WBC: 11.4 10*3/uL — ABNORMAL HIGH (ref 4.0–10.5)
nRBC: 0 % (ref 0.0–0.2)

## 2020-11-22 LAB — APTT: aPTT: 29 seconds (ref 24–36)

## 2020-11-22 LAB — TYPE AND SCREEN
ABO/RH(D): O POS
Antibody Screen: NEGATIVE

## 2020-11-22 LAB — PROTIME-INR
INR: 1 (ref 0.8–1.2)
Prothrombin Time: 12.9 seconds (ref 11.4–15.2)

## 2020-11-22 LAB — C-REACTIVE PROTEIN: CRP: 0.5 mg/dL (ref <1.0)

## 2020-11-22 LAB — SURGICAL PCR SCREEN
MRSA, PCR: NEGATIVE
Staphylococcus aureus: NEGATIVE

## 2020-11-22 LAB — SEDIMENTATION RATE: Sed Rate: 4 mm/hr (ref 0–30)

## 2020-11-22 NOTE — Patient Instructions (Signed)
Your procedure is scheduled on: 12-01-20 Wednesday   Report to the Registration Desk on the 1st floor of the [facility]- Then proceed to the 2nd floor Surgery Desk on the Medical [facility] To find out your arrival time please call [phone] between 1PM - 3PM on 11-30-20 TUESDAY  Remember: Instructions that are not followed completely may result in serious medical risk,  up to and including death, or upon the discretion of your surgeon and anesthesiologist your  surgery may need to be rescheduled.   Do not eat food after midnight the night before your procedure. No gum chewing, lozengers or hard candies.  You may however, drink clear liquids up to 2 hours before you are scheduled to arrive for your surgery- Do not drink anything within   2 hours of your scheduled arrival time.  Clear liquids include: - water - apple juice without pulp - black coffee or tea (DO NOT add milk or creamers to the coffee or tea) DO NOT drink anything that is not on this list.  Type 1 and Type 2 diabetics should only drink water.  In addition, your doctor has ordered for you to drink the provided. Ensure Pre-surgey Clear Carbohydrate Drink Drinking this carbohydrate drink up to two hours before your surgery helps to reduce insulin resistance and improve patients outcomes. Please complete drinking 2 hours prior to scheduled arrival time.  TAKE THESE MEDICATIONS THE MORNING OF SURGERY WITH A SIP OF WATER: - omeprazole (PRILOSEC) 20 MG (take one the night before and one on the morning of surgery- helps to prevent nausea after surgery. - venlafaxine XR (EFFEXOR-XR) 150 MG  Follow recommendations from cardiologists, pulmonologist's or PCP regarding stopping Aspirin, Coumadin, Plavix, Eliquis, Pradaxa or Pletal. Stop aspirin EC 81 MG   One week prior to surgery: Stop anti-inflammatories (NSAIDS) such as Advil, Aleve, Ibuprofen, Motrin, naproxen, Naprosyn and Aspirin based products such as  Excedrin, Goody;s BC Powder- OK TO TAKE TYLENOL IF NEEDED.   Stop ANY OVER THE COUNTER supplements until after surgery- STOP Melatonin 10 MG  NOW- HOWEVER YOU MAY CONTINUE YOUR VITAMIN  D3 UP TO THE DAY PRIOR TO SURGERY   No Alcohol for 24 hours before or after surgery.  Do Not Smoke or use e-cigarettes For 24 Hours Prior to Your Surgery. No chewable tobacco products for at least 6 hours prior to surgery. No nicotine patches on the day of surgery.  Do not use any "recreational" drugs for at least one week prior to your surgery.   Please be advised that the combination of cocaine and anesthesia may have negative outcomes, up to and including death.  If you test positive for cocaine, your surgery will be cancelled.  On the morning of surgery brush your teeth with toothpaste and water, you may rinse your mouth with mouthwash if you wish.   Do not swallow any toothpaste of mouthwash.     Do not wear jewelry, make-up, hairpins, clips or nail polish.  Do not wear lotions, powders, or perfumes.   Do not shave body from the neck down 48 hours prior to surgery just in case you cut yourself which could also leave a site for infection. Also, freshly shave skin may become irritates if using CHG soap.  Contacts, hearing aids and dentures may not be worn into surgery.  Do not bring valuables to the hospital. [facility] is not responsible for any missing/lost belongings or valuables.   Use CHG Soap as directed on instruction sheet.  Notify  your doctor if there is any change in your medical condition (cold, fever, infection).   Wear comfortable clothing (specific to your surgery type) to the hospital.  Plan for stool softeners for home use; pain medications have a tendency t cause constipation.  You can also help prevent constipation by eating foods high in fiber such as fruits and vegetables and drinking plenty of fluids as your diet allows.  After surgery, you can help prevent lung  complications by doing breathing exercises. Take deep breaths and cough every 1-2 hours. Your doctor may order a device called an Incentive Spirometer to help you take deep breaths. When coughing or sneezing, hold a pillow firmly against your incision. It also decreases belly discomfort.  If you are being admitted to the hospital overnight, leave your suitcase in the car.  After surgery it may be brought to your room.  If you are being discharged the day of surgery, you will not be allowed to drive home.   You will need a responsible adult (18 years or older) to drive you home and stay with you that night.  If you are taking public transportation, you will need to have a responsible adult (18 years r older) with you. Please confirm with your physician that it is acceptable to use public transportation.  Please call the Pre-admissions Testing Dept. at [phone]) if you have any questions about these instructions.   Surgery Visitation Policy:  Patients undergoing a surgery or procedure may have one family member or support person with them as long as that person is not COVID-19 positive or experiencing its symptoms.  That person may remain in the waiting area during the procedure.   Inpatient Visitation:   Visiting hours are 7 a.m. to 8 p.m. Inpatients will be allowed two visitors daily. The visitors may change each day during the patient's stay. No visitors under the age of 8. Any visitors under the age of 37 must be accompanied by an adult. The visitors must pass COVID-19 screening, use hand sanitizer when entering and exiting the patient's room and wear mask at all times, including the patient's room.  Masking is required regardless of vaccination status.

## 2020-11-23 ENCOUNTER — Other Ambulatory Visit: Admission: RE | Admit: 2020-11-23 | Payer: Medicare HMO | Source: Ambulatory Visit

## 2020-11-24 LAB — URINE CULTURE: Special Requests: NORMAL

## 2020-11-26 ENCOUNTER — Other Ambulatory Visit: Payer: Self-pay | Admitting: Family Medicine

## 2020-11-26 NOTE — Telephone Encounter (Signed)
Pt called to follow up on med refill 

## 2020-11-29 ENCOUNTER — Other Ambulatory Visit: Payer: Self-pay

## 2020-11-29 ENCOUNTER — Other Ambulatory Visit
Admission: RE | Admit: 2020-11-29 | Discharge: 2020-11-29 | Disposition: A | Discharge status: Home / Self Care | Payer: Medicare HMO | Source: Ambulatory Visit | Attending: Orthopedic Surgery | Admitting: Orthopedic Surgery

## 2020-11-29 DIAGNOSIS — Z20822 Contact with and (suspected) exposure to covid-19: Secondary | ICD-10-CM | POA: Insufficient documentation

## 2020-11-29 DIAGNOSIS — Z01812 Encounter for preprocedural laboratory examination: Secondary | ICD-10-CM | POA: Insufficient documentation

## 2020-11-29 LAB — SARS CORONAVIRUS 2 (TAT 6-24 HRS): SARS Coronavirus 2: NEGATIVE

## 2020-11-30 MED ORDER — CHLORHEXIDINE GLUCONATE 4 % EX LIQD
60.0000 mL | Freq: Once | CUTANEOUS | Status: AC
  Administered 2020-12-01: 4 via TOPICAL

## 2020-11-30 MED ORDER — TRANEXAMIC ACID-NACL 1000-0.7 MG/100ML-% IV SOLN
1000.0000 mg | INTRAVENOUS | Status: AC
  Administered 2020-12-01: 1000 mg via INTRAVENOUS

## 2020-11-30 MED ORDER — CELECOXIB 200 MG PO CAPS: 400.0000 mg | ORAL_CAPSULE | Freq: Once | ORAL | Status: AC

## 2020-11-30 MED ORDER — GABAPENTIN 300 MG PO CAPS: 300.0000 mg | ORAL_CAPSULE | Freq: Once | ORAL | Status: AC

## 2020-11-30 MED ORDER — CHLORHEXIDINE GLUCONATE 0.12 % MT SOLN: 15.0000 mL | Freq: Once | OROMUCOSAL | Status: AC

## 2020-11-30 MED ORDER — CEFAZOLIN SODIUM-DEXTROSE 2-4 GM/100ML-% IV SOLN
2.0000 g | INTRAVENOUS | Status: AC
  Administered 2020-12-01: 2 g via INTRAVENOUS

## 2020-11-30 MED ORDER — ORAL CARE MOUTH RINSE: 15.0000 mL | Freq: Once | OROMUCOSAL | Status: AC

## 2020-11-30 MED ORDER — LACTATED RINGERS IV SOLN: INTRAVENOUS | Status: DC

## 2020-11-30 MED ORDER — DEXAMETHASONE SODIUM PHOSPHATE 10 MG/ML IJ SOLN: 8.0000 mg | Freq: Once | INTRAMUSCULAR | Status: AC

## 2020-12-01 ENCOUNTER — Inpatient Hospital Stay: Payer: Medicare HMO | Admitting: Urgent Care

## 2020-12-01 ENCOUNTER — Other Ambulatory Visit: Payer: Self-pay

## 2020-12-01 ENCOUNTER — Encounter: Payer: Self-pay | Admitting: Orthopedic Surgery

## 2020-12-01 ENCOUNTER — Encounter
Admission: RE | Disposition: A | Discharge status: Home / Self Care | Payer: Self-pay | Source: Home / Self Care | Attending: Orthopedic Surgery

## 2020-12-01 ENCOUNTER — Inpatient Hospital Stay: Payer: Medicare HMO

## 2020-12-01 ENCOUNTER — Inpatient Hospital Stay
Admission: RE | Admit: 2020-12-01 | Discharge: 2020-12-02 | DRG: 470 | Disposition: A | Discharge status: Home / Self Care | Payer: Medicare HMO | Attending: Orthopedic Surgery | Admitting: Orthopedic Surgery

## 2020-12-01 DIAGNOSIS — I10 Essential (primary) hypertension: Secondary | ICD-10-CM | POA: Diagnosis present

## 2020-12-01 DIAGNOSIS — N840 Polyp of corpus uteri: Secondary | ICD-10-CM | POA: Insufficient documentation

## 2020-12-01 DIAGNOSIS — F32A Depression, unspecified: Secondary | ICD-10-CM | POA: Diagnosis present

## 2020-12-01 DIAGNOSIS — M1712 Unilateral primary osteoarthritis, left knee: Principal | ICD-10-CM | POA: Diagnosis present

## 2020-12-01 DIAGNOSIS — Z8249 Family history of ischemic heart disease and other diseases of the circulatory system: Secondary | ICD-10-CM | POA: Diagnosis not present

## 2020-12-01 DIAGNOSIS — Z96652 Presence of left artificial knee joint: Secondary | ICD-10-CM

## 2020-12-01 DIAGNOSIS — Z87891 Personal history of nicotine dependence: Secondary | ICD-10-CM

## 2020-12-01 DIAGNOSIS — Z7982 Long term (current) use of aspirin: Secondary | ICD-10-CM

## 2020-12-01 DIAGNOSIS — Z96659 Presence of unspecified artificial knee joint: Secondary | ICD-10-CM

## 2020-12-01 DIAGNOSIS — Z791 Long term (current) use of non-steroidal anti-inflammatories (NSAID): Secondary | ICD-10-CM | POA: Diagnosis not present

## 2020-12-01 DIAGNOSIS — Z8041 Family history of malignant neoplasm of ovary: Secondary | ICD-10-CM | POA: Diagnosis not present

## 2020-12-01 DIAGNOSIS — Z79899 Other long term (current) drug therapy: Secondary | ICD-10-CM

## 2020-12-01 DIAGNOSIS — Z825 Family history of asthma and other chronic lower respiratory diseases: Secondary | ICD-10-CM

## 2020-12-01 DIAGNOSIS — Z20822 Contact with and (suspected) exposure to covid-19: Secondary | ICD-10-CM | POA: Diagnosis present

## 2020-12-01 DIAGNOSIS — R102 Pelvic and perineal pain unspecified side: Secondary | ICD-10-CM | POA: Insufficient documentation

## 2020-12-01 DIAGNOSIS — Z8261 Family history of arthritis: Secondary | ICD-10-CM

## 2020-12-01 DIAGNOSIS — E781 Pure hyperglyceridemia: Secondary | ICD-10-CM | POA: Diagnosis present

## 2020-12-01 DIAGNOSIS — K219 Gastro-esophageal reflux disease without esophagitis: Secondary | ICD-10-CM | POA: Diagnosis present

## 2020-12-01 DIAGNOSIS — E785 Hyperlipidemia, unspecified: Secondary | ICD-10-CM | POA: Diagnosis present

## 2020-12-01 HISTORY — DX: Presence of left artificial knee joint: Z96.652

## 2020-12-01 HISTORY — PX: KNEE ARTHROPLASTY: SHX992

## 2020-12-01 SURGERY — ARTHROPLASTY, KNEE, TOTAL, USING IMAGELESS COMPUTER-ASSISTED NAVIGATION
Anesthesia: Spinal | Site: Knee | Laterality: Left

## 2020-12-01 MED ORDER — VENLAFAXINE HCL ER 150 MG PO CP24
150.0000 mg | ORAL_CAPSULE | Freq: Every day | ORAL | Status: DC
  Administered 2020-12-02: 150 mg via ORAL
  Filled 2020-12-01: qty 1

## 2020-12-01 MED ORDER — LORATADINE 10 MG PO TABS
10.0000 mg | ORAL_TABLET | Freq: Every day | ORAL | Status: DC
  Administered 2020-12-01 – 2020-12-02 (×2): 10 mg via ORAL
  Filled 2020-12-01 (×2): qty 1

## 2020-12-01 MED ORDER — METOCLOPRAMIDE HCL 10 MG PO TABS
10.0000 mg | ORAL_TABLET | Freq: Three times a day (TID) | ORAL | Status: DC
  Administered 2020-12-01 – 2020-12-02 (×4): 10 mg via ORAL
  Filled 2020-12-01 (×4): qty 1

## 2020-12-01 MED ORDER — SURGIPHOR WOUND IRRIGATION SYSTEM - OPTIME
TOPICAL | Status: DC | PRN
  Administered 2020-12-01: 1 via TOPICAL

## 2020-12-01 MED ORDER — CHLORHEXIDINE GLUCONATE 0.12 % MT SOLN
OROMUCOSAL | Status: AC
  Administered 2020-12-01: 15 mL via OROMUCOSAL
  Filled 2020-12-01: qty 15

## 2020-12-01 MED ORDER — TRAMADOL HCL 50 MG PO TABS
50.0000 mg | ORAL_TABLET | ORAL | Status: DC | PRN
  Administered 2020-12-02: 100 mg via ORAL
  Filled 2020-12-01: qty 2

## 2020-12-01 MED ORDER — BUPIVACAINE LIPOSOME 1.3 % IJ SUSP
INTRAMUSCULAR | Status: AC
  Filled 2020-12-01: qty 20

## 2020-12-01 MED ORDER — SODIUM CHLORIDE FLUSH 0.9 % IV SOLN
INTRAVENOUS | Status: AC
  Filled 2020-12-01: qty 40

## 2020-12-01 MED ORDER — CEFAZOLIN SODIUM-DEXTROSE 2-4 GM/100ML-% IV SOLN
INTRAVENOUS | Status: AC
  Filled 2020-12-01: qty 100

## 2020-12-01 MED ORDER — TRANEXAMIC ACID-NACL 1000-0.7 MG/100ML-% IV SOLN
1000.0000 mg | Freq: Once | INTRAVENOUS | Status: DC
  Filled 2020-12-01: qty 100

## 2020-12-01 MED ORDER — ALUM & MAG HYDROXIDE-SIMETH 200-200-20 MG/5ML PO SUSP: 30.0000 mL | ORAL | Status: DC | PRN

## 2020-12-01 MED ORDER — MENTHOL 3 MG MT LOZG
1.0000 | LOZENGE | OROMUCOSAL | Status: DC | PRN
  Filled 2020-12-01: qty 9

## 2020-12-01 MED ORDER — SODIUM CHLORIDE 0.9 % IV SOLN
INTRAVENOUS | Status: DC | PRN
  Administered 2020-12-01: 250 mL via INTRAVENOUS

## 2020-12-01 MED ORDER — MIDAZOLAM HCL 2 MG/2ML IJ SOLN
INTRAMUSCULAR | Status: AC
  Filled 2020-12-01: qty 2

## 2020-12-01 MED ORDER — CEFAZOLIN SODIUM-DEXTROSE 2-4 GM/100ML-% IV SOLN
2.0000 g | Freq: Four times a day (QID) | INTRAVENOUS | Status: AC
  Administered 2020-12-01 (×2): 2 g via INTRAVENOUS
  Filled 2020-12-01 (×2): qty 100

## 2020-12-01 MED ORDER — OXYCODONE HCL 5 MG PO TABS: 10.0000 mg | ORAL_TABLET | ORAL | Status: DC | PRN

## 2020-12-01 MED ORDER — FENTANYL CITRATE (PF) 100 MCG/2ML IJ SOLN
INTRAMUSCULAR | Status: DC | PRN
  Administered 2020-12-01: 50 ug via INTRAVENOUS

## 2020-12-01 MED ORDER — DEXAMETHASONE SODIUM PHOSPHATE 10 MG/ML IJ SOLN
INTRAMUSCULAR | Status: AC
  Administered 2020-12-01: 8 mg via INTRAVENOUS
  Filled 2020-12-01: qty 1

## 2020-12-01 MED ORDER — MAGNESIUM HYDROXIDE 400 MG/5ML PO SUSP
30.0000 mL | Freq: Every day | ORAL | Status: DC
  Administered 2020-12-01 – 2020-12-02 (×2): 30 mL via ORAL
  Filled 2020-12-01 (×2): qty 30

## 2020-12-01 MED ORDER — ENSURE PRE-SURGERY PO LIQD
296.0000 mL | Freq: Once | ORAL | Status: DC
  Filled 2020-12-01: qty 296

## 2020-12-01 MED ORDER — NEOMYCIN-POLYMYXIN B GU 40-200000 IR SOLN
Status: AC
  Filled 2020-12-01: qty 20

## 2020-12-01 MED ORDER — ACETAMINOPHEN 10 MG/ML IV SOLN
INTRAVENOUS | Status: DC | PRN
  Administered 2020-12-01: 1000 mg via INTRAVENOUS

## 2020-12-01 MED ORDER — HYDROMORPHONE HCL 1 MG/ML IJ SOLN: 0.5000 mg | INTRAMUSCULAR | Status: DC | PRN

## 2020-12-01 MED ORDER — GABAPENTIN 100 MG PO CAPS
100.0000 mg | ORAL_CAPSULE | Freq: Every day | ORAL | Status: DC
  Administered 2020-12-01: 100 mg via ORAL
  Filled 2020-12-01: qty 1

## 2020-12-01 MED ORDER — MIDAZOLAM HCL 5 MG/5ML IJ SOLN
INTRAMUSCULAR | Status: DC | PRN
  Administered 2020-12-01: 2 mg via INTRAVENOUS

## 2020-12-01 MED ORDER — ROSUVASTATIN CALCIUM 10 MG PO TABS
40.0000 mg | ORAL_TABLET | Freq: Every day | ORAL | Status: DC
  Administered 2020-12-01 – 2020-12-02 (×2): 40 mg via ORAL
  Filled 2020-12-01 (×2): qty 4

## 2020-12-01 MED ORDER — BUPIVACAINE HCL (PF) 0.25 % IJ SOLN
INTRAMUSCULAR | Status: AC
  Filled 2020-12-01: qty 30

## 2020-12-01 MED ORDER — SENNOSIDES-DOCUSATE SODIUM 8.6-50 MG PO TABS
1.0000 | ORAL_TABLET | Freq: Two times a day (BID) | ORAL | Status: DC
  Administered 2020-12-01 – 2020-12-02 (×3): 1 via ORAL
  Filled 2020-12-01 (×3): qty 1

## 2020-12-01 MED ORDER — FERROUS SULFATE 325 (65 FE) MG PO TABS
325.0000 mg | ORAL_TABLET | Freq: Two times a day (BID) | ORAL | Status: DC
  Administered 2020-12-01 – 2020-12-02 (×2): 325 mg via ORAL
  Filled 2020-12-01 (×3): qty 1

## 2020-12-01 MED ORDER — PROPOFOL 1000 MG/100ML IV EMUL
INTRAVENOUS | Status: AC
  Filled 2020-12-01: qty 100

## 2020-12-01 MED ORDER — PROPOFOL 500 MG/50ML IV EMUL
INTRAVENOUS | Status: DC | PRN
  Administered 2020-12-01: 100 ug/kg/min via INTRAVENOUS

## 2020-12-01 MED ORDER — ENOXAPARIN SODIUM 30 MG/0.3ML ~~LOC~~ SOLN
30.0000 mg | Freq: Two times a day (BID) | SUBCUTANEOUS | Status: DC
  Administered 2020-12-02: 30 mg via SUBCUTANEOUS
  Filled 2020-12-01: qty 0.3

## 2020-12-01 MED ORDER — FLUTICASONE PROPIONATE 50 MCG/ACT NA SUSP
2.0000 | Freq: Every day | NASAL | Status: DC
  Administered 2020-12-01: 2 via NASAL
  Filled 2020-12-01: qty 16

## 2020-12-01 MED ORDER — PHENOL 1.4 % MT LIQD
1.0000 | OROMUCOSAL | Status: DC | PRN
  Filled 2020-12-01: qty 177

## 2020-12-01 MED ORDER — BUPIVACAINE HCL (PF) 0.25 % IJ SOLN
INTRAMUSCULAR | Status: DC | PRN
  Administered 2020-12-01: 60 mL

## 2020-12-01 MED ORDER — BUPIVACAINE HCL (PF) 0.5 % IJ SOLN
INTRAMUSCULAR | Status: DC | PRN
  Administered 2020-12-01: 2.4 mL

## 2020-12-01 MED ORDER — ONDANSETRON HCL 4 MG/2ML IJ SOLN
INTRAMUSCULAR | Status: DC | PRN
  Administered 2020-12-01: 4 mg via INTRAVENOUS

## 2020-12-01 MED ORDER — FENTANYL CITRATE (PF) 100 MCG/2ML IJ SOLN
INTRAMUSCULAR | Status: AC
  Filled 2020-12-01: qty 2

## 2020-12-01 MED ORDER — PANTOPRAZOLE SODIUM 40 MG PO TBEC
40.0000 mg | DELAYED_RELEASE_TABLET | Freq: Two times a day (BID) | ORAL | Status: DC
  Administered 2020-12-01 – 2020-12-02 (×3): 40 mg via ORAL
  Filled 2020-12-01 (×3): qty 1

## 2020-12-01 MED ORDER — HYDROCHLOROTHIAZIDE 25 MG PO TABS
25.0000 mg | ORAL_TABLET | Freq: Every day | ORAL | Status: DC
  Administered 2020-12-01 – 2020-12-02 (×2): 25 mg via ORAL
  Filled 2020-12-01 (×2): qty 1

## 2020-12-01 MED ORDER — SODIUM CHLORIDE 0.9 % IR SOLN
Status: DC | PRN
  Administered 2020-12-01: 500 mL

## 2020-12-01 MED ORDER — GABAPENTIN 300 MG PO CAPS
ORAL_CAPSULE | ORAL | Status: AC
  Administered 2020-12-01: 300 mg via ORAL
  Filled 2020-12-01: qty 1

## 2020-12-01 MED ORDER — KETOTIFEN FUMARATE 0.025 % OP SOLN
1.0000 [drp] | Freq: Two times a day (BID) | OPHTHALMIC | Status: DC | PRN
  Filled 2020-12-01: qty 5

## 2020-12-01 MED ORDER — CELECOXIB 200 MG PO CAPS
200.0000 mg | ORAL_CAPSULE | Freq: Two times a day (BID) | ORAL | Status: DC
  Administered 2020-12-02: 200 mg via ORAL
  Filled 2020-12-01: qty 1

## 2020-12-01 MED ORDER — OXYCODONE HCL 5 MG PO TABS
5.0000 mg | ORAL_TABLET | ORAL | Status: DC | PRN
  Administered 2020-12-01: 5 mg via ORAL
  Filled 2020-12-01: qty 1

## 2020-12-01 MED ORDER — PROPOFOL 10 MG/ML IV BOLUS
INTRAVENOUS | Status: AC
  Filled 2020-12-01: qty 20

## 2020-12-01 MED ORDER — FLEET ENEMA 7-19 GM/118ML RE ENEM: 1.0000 | ENEMA | Freq: Once | RECTAL | Status: DC | PRN

## 2020-12-01 MED ORDER — SODIUM CHLORIDE 0.9 % IV SOLN: INTRAVENOUS | Status: DC

## 2020-12-01 MED ORDER — CELECOXIB 200 MG PO CAPS
ORAL_CAPSULE | ORAL | Status: AC
  Administered 2020-12-01: 400 mg via ORAL
  Filled 2020-12-01: qty 2

## 2020-12-01 MED ORDER — METOPROLOL TARTRATE 5 MG/5ML IV SOLN
INTRAVENOUS | Status: DC | PRN
  Administered 2020-12-01: 1 mg via INTRAVENOUS
  Administered 2020-12-01 (×2): 2 mg via INTRAVENOUS

## 2020-12-01 MED ORDER — POLYVINYL ALCOHOL 1.4 % OP SOLN
1.0000 [drp] | Freq: Three times a day (TID) | OPHTHALMIC | Status: DC | PRN
  Filled 2020-12-01: qty 15

## 2020-12-01 MED ORDER — BISACODYL 10 MG RE SUPP: 10.0000 mg | Freq: Every day | RECTAL | Status: DC | PRN

## 2020-12-01 MED ORDER — ACETAMINOPHEN 325 MG PO TABS: 325.0000 mg | ORAL_TABLET | Freq: Four times a day (QID) | ORAL | Status: DC | PRN

## 2020-12-01 MED ORDER — ACETAMINOPHEN 10 MG/ML IV SOLN
1000.0000 mg | Freq: Four times a day (QID) | INTRAVENOUS | Status: AC
  Administered 2020-12-01 – 2020-12-02 (×3): 1000 mg via INTRAVENOUS
  Filled 2020-12-01 (×3): qty 100

## 2020-12-01 MED ORDER — TRANEXAMIC ACID-NACL 1000-0.7 MG/100ML-% IV SOLN
INTRAVENOUS | Status: AC
  Filled 2020-12-01: qty 100

## 2020-12-01 MED ORDER — SODIUM CHLORIDE 0.9 % IV SOLN
INTRAVENOUS | Status: DC | PRN
  Administered 2020-12-01: 60 mL

## 2020-12-01 MED ORDER — TRANEXAMIC ACID-NACL 1000-0.7 MG/100ML-% IV SOLN
INTRAVENOUS | Status: AC
  Administered 2020-12-01: 1000 mg
  Filled 2020-12-01: qty 100

## 2020-12-01 MED ORDER — FENTANYL CITRATE (PF) 250 MCG/5ML IJ SOLN
INTRAMUSCULAR | Status: AC
  Filled 2020-12-01: qty 5

## 2020-12-01 MED ORDER — DIPHENHYDRAMINE HCL 12.5 MG/5ML PO ELIX
12.5000 mg | ORAL_SOLUTION | ORAL | Status: DC | PRN
Start: 2020-12-01 — End: 2020-12-02

## 2020-12-01 MED ORDER — ONDANSETRON HCL 4 MG/2ML IJ SOLN: 4.0000 mg | Freq: Four times a day (QID) | INTRAMUSCULAR | Status: DC | PRN

## 2020-12-01 MED ORDER — ONDANSETRON HCL 4 MG PO TABS: 4.0000 mg | ORAL_TABLET | Freq: Four times a day (QID) | ORAL | Status: DC | PRN

## 2020-12-01 MED ORDER — TETRACAINE HCL 1 % IJ SOLN
INTRAMUSCULAR | Status: DC | PRN
  Administered 2020-12-01: 1 mg via INTRASPINAL

## 2020-12-01 SURGICAL SUPPLY — 71 items
ATTUNE MED DOME PAT 32 KNEE (Knees) ×2 IMPLANT
ATTUNE PS FEM LT SZ 3 CEM KNEE (Femur) ×2 IMPLANT
ATTUNE PSRP INSR SZ3 6 KNEE (Insert) ×2 IMPLANT
BASE TIBIAL ROT PLAT SZ 3 KNEE (Knees) ×1 IMPLANT
BATTERY INSTRU NAVIGATION (MISCELLANEOUS) ×8 IMPLANT
BLADE SAW 70X12.5 (BLADE) ×2 IMPLANT
BLADE SAW 90X13X1.19 OSCILLAT (BLADE) ×2 IMPLANT
BLADE SAW 90X25X1.19 OSCILLAT (BLADE) ×2 IMPLANT
CEMENT HV SMART SET (Cement) ×4 IMPLANT
COOLER POLAR GLACIER W/PUMP (MISCELLANEOUS) ×2 IMPLANT
COVER WAND RF STERILE (DRAPES) ×2 IMPLANT
CUFF TOURN SGL QUICK 24 (TOURNIQUET CUFF) ×2
CUFF TRNQT CYL 24X4X16.5-23 (TOURNIQUET CUFF) ×1 IMPLANT
DRAPE 3/4 80X56 (DRAPES) ×2 IMPLANT
DRESSING PEEL AND PLAC PRVNA20 (GAUZE/BANDAGES/DRESSINGS) ×1 IMPLANT
DRSG DERMACEA 8X12 NADH (GAUZE/BANDAGES/DRESSINGS) ×2 IMPLANT
DRSG MEPILEX SACRM 8.7X9.8 (GAUZE/BANDAGES/DRESSINGS) ×2 IMPLANT
DRSG OPSITE POSTOP 4X14 (GAUZE/BANDAGES/DRESSINGS) ×2 IMPLANT
DRSG PEEL AND PLACE PREVENA 20 (GAUZE/BANDAGES/DRESSINGS) ×2
DRSG TEGADERM 4X4.75 (GAUZE/BANDAGES/DRESSINGS) ×2 IMPLANT
DURAPREP 26ML APPLICATOR (WOUND CARE) ×4 IMPLANT
ELECT CAUTERY BLADE 6.4 (BLADE) ×2 IMPLANT
ELECT REM PT RETURN 9FT ADLT (ELECTROSURGICAL) ×2
ELECTRODE REM PT RTRN 9FT ADLT (ELECTROSURGICAL) ×1 IMPLANT
EX-PIN ORTHOLOCK NAV 4X150 (PIN) ×4 IMPLANT
GLOVE SURG ENC MOIS LTX SZ7.5 (GLOVE) ×4 IMPLANT
GLOVE SURG ENC TEXT LTX SZ7.5 (GLOVE) ×4 IMPLANT
GLOVE SURG UNDER LTX SZ8 (GLOVE) ×2 IMPLANT
GLOVE SURG UNDER POLY LF SZ7.5 (GLOVE) ×2 IMPLANT
GOWN STRL REUS W/ TWL LRG LVL3 (GOWN DISPOSABLE) ×2 IMPLANT
GOWN STRL REUS W/ TWL XL LVL3 (GOWN DISPOSABLE) ×1 IMPLANT
GOWN STRL REUS W/TWL LRG LVL3 (GOWN DISPOSABLE) ×4
GOWN STRL REUS W/TWL XL LVL3 (GOWN DISPOSABLE) ×2
HEMOVAC 400CC 10FR (MISCELLANEOUS) ×2 IMPLANT
HOLDER FOLEY CATH W/STRAP (MISCELLANEOUS) ×2 IMPLANT
HOOD PEEL AWAY FLYTE STAYCOOL (MISCELLANEOUS) ×4 IMPLANT
IRRIGATION SURGIPHOR STRL (IV SOLUTION) ×2 IMPLANT
IV NS IRRIG 3000ML ARTHROMATIC (IV SOLUTION) ×2 IMPLANT
KIT TURNOVER KIT A (KITS) ×2 IMPLANT
KNIFE SCULPS 14X20 (INSTRUMENTS) ×2 IMPLANT
LABEL OR SOLS (LABEL) ×2 IMPLANT
MANIFOLD NEPTUNE II (INSTRUMENTS) ×4 IMPLANT
NDL SAFETY ECLIPSE 18X1.5 (NEEDLE) ×1 IMPLANT
NEEDLE HYPO 18GX1.5 SHARP (NEEDLE) ×2
NEEDLE SPNL 20GX3.5 QUINCKE YW (NEEDLE) ×4 IMPLANT
NS IRRIG 500ML POUR BTL (IV SOLUTION) ×2 IMPLANT
PACK TOTAL KNEE (MISCELLANEOUS) ×2 IMPLANT
PAD WRAPON POLAR KNEE (MISCELLANEOUS) ×1 IMPLANT
PENCIL SMOKE EVACUATOR COATED (MISCELLANEOUS) ×2 IMPLANT
PIN DRILL QUICK PACK ×2 IMPLANT
PIN FIXATION 1/8DIA X 3INL (PIN) ×6 IMPLANT
PULSAVAC PLUS IRRIG FAN TIP (DISPOSABLE) ×2
SOL PREP PVP 2OZ (MISCELLANEOUS) ×2
SOLUTION PREP PVP 2OZ (MISCELLANEOUS) ×1 IMPLANT
SPONGE DRAIN TRACH 4X4 STRL 2S (GAUZE/BANDAGES/DRESSINGS) ×2 IMPLANT
STAPLER SKIN PROX 35W (STAPLE) ×2 IMPLANT
STOCKINETTE IMPERV 14X48 (MISCELLANEOUS) ×2 IMPLANT
STRAP TIBIA SHORT (MISCELLANEOUS) ×2 IMPLANT
SUCTION FRAZIER HANDLE 10FR (MISCELLANEOUS) ×2
SUCTION TUBE FRAZIER 10FR DISP (MISCELLANEOUS) ×1 IMPLANT
SUT VIC AB 0 CT1 36 (SUTURE) ×4 IMPLANT
SUT VIC AB 1 CT1 36 (SUTURE) ×4 IMPLANT
SUT VIC AB 2-0 CT2 27 (SUTURE) ×2 IMPLANT
SYR 20ML LL LF (SYRINGE) ×2 IMPLANT
SYR 30ML LL (SYRINGE) ×4 IMPLANT
TIBIAL BASE ROT PLAT SZ 3 KNEE (Knees) ×2 IMPLANT
TIP FAN IRRIG PULSAVAC PLUS (DISPOSABLE) ×1 IMPLANT
TOWEL OR 17X26 4PK STRL BLUE (TOWEL DISPOSABLE) ×2 IMPLANT
TOWER CARTRIDGE SMART MIX (DISPOSABLE) ×2 IMPLANT
TRAY FOLEY MTR SLVR 16FR STAT (SET/KITS/TRAYS/PACK) ×2 IMPLANT
WRAPON POLAR PAD KNEE (MISCELLANEOUS) ×2

## 2020-12-01 NOTE — Anesthesia Procedure Notes (Signed)
Spinal  Patient location during procedure: OR Start time: 12/01/2020 7:20 AM End time: 12/01/2020 7:23 AM Reason for block: surgical anesthesia Staffing Performed: resident/CRNA  Resident/CRNA: Nelda Marseille, CRNA Preanesthetic Checklist Completed: patient identified, IV checked, site marked, risks and benefits discussed, surgical consent, monitors and equipment checked, pre-op evaluation and timeout performed Spinal Block Patient position: sitting Prep: Betadine Patient monitoring: heart rate, continuous pulse ox, blood pressure and cardiac monitor Approach: midline Location: L3-4 Injection technique: single-shot Needle Needle type: Whitacre and Introducer  Needle gauge: 25 G Needle length: 9 cm Assessment Sensory level: T10 Events: CSF return Additional Notes Negative paresthesia. Negative blood return. Positive free-flowing CSF. Expiration date of kit checked and confirmed. Patient tolerated procedure well, without complications.

## 2020-12-01 NOTE — H&P (Signed)
ORTHOPAEDIC HISTORY & PHYSICAL Ashley [name], [name] - 11/25/2020 2:45 PM EDT Formatting of this note is different from the original. [facility] Chief Complaint:   Chief Complaint  Patient presents with  . Knee Pain  H & P LEFT KNEE   History of Present Illness:   Ashley [name] is a 80 y.o. female that presents to clinic today for her preoperative history and evaluation. Patient presents unaccompanied. The patient is scheduled to undergo a left total knee arthroplasty on 12/01/20 by Dr. Ernest Pine. Her pain began nearly 3 years ago. The pain is located primarily along the medial aspect of the knee. She describes her pain as worse with weightbearing. She reports associated swelling with some giving way of the knee. She denies associated numbness or tingling.   The patient's symptoms have progressed to the point that they decrease her quality of life. The patient has previously undergone conservative treatment including NSAIDS and injections to the knee without adequate control of her symptoms.  Patient has previously had a L3-L5 laminectomy but denies retained hardware. Denies significant cardiac history, denies history of DVT. Patient is not diabetic.  Past Medical, Surgical, Family, Social History, Allergies, Medications:   Past Medical History:  Past Medical History:  Diagnosis Date  . AR (allergic rhinitis)  . Chicken pox  . Depression  . Essential hypertension with goal blood pressure less than 140/90 12/28/2014  Ace and arb angioedema  . Gastroesophageal reflux disease without esophagitis 11/12/2017  . GERD (gastroesophageal reflux disease)  . Hyperlipidemia  . Hypertriglyceridemia  . Psychiatric symptoms  . Spinal stenosis of lumbar region with radiculopathy 11/12/2017   Past Surgical History:  Past Surgical History:  Procedure Laterality Date  . COLONOSCOPY 10/18/2001, 10/12/2009  . COLONOSCOPY 12/24/2019  Normal Colon/No  Repeat due to age/TKT  . DILATION AND CURETTAGE OF UTERUS  Fx D&C  . Excision of synovial cyst from the right AC joint  . Fractional D&C with Myosure 03/30/2017  . LAMINECTOMY POSTERIOR CERVICLE DECOMP W/FACETECTOMY & FORAMINOTOMY Bilateral 11/19/2017  Procedure: LAMINEC/FACETECT/FORAMIN,EACH ADDNL; Surgeon: Judeth Porch, MD; Location: [facility] OR; Service: Neurosurgery; Laterality: Bilateral;  . LAMINECTOMY POSTERIOR LUMBAR FACETECTOMY & FORAMINOTOMY W/DECOMP Bilateral 11/19/2017  Procedure: Lumbar three-five laminectomy.; Surgeon: Judeth Porch, MD; Location: [facility] OR; Service: Neurosurgery; Laterality: Bilateral;  . Normal vaginal delivery 1968  . Right carpal tunnel release 07/30/2019  Dr Rosita Kea  . TONSILLECTOMY   Current Medications:  Current Outpatient Medications  Medication Sig Dispense Refill  . acetaminophen (TYLENOL) 650 MG ER tablet Take 1,300 mg by mouth once daily as needed for Pain  . aspirin 81 MG EC tablet Take 1 tablet (81 mg total) by mouth once daily Ok to resume aspirin 7 days after surgery.  Marland Kitchen azelastine (OPTIVAR) 0.05 % ophthalmic solution Place 1 drop into both eyes 2 (two) times daily  . celecoxib (CELEBREX) 200 MG capsule TAKE 1 CAPSULE BY MOUTH TWICE DAILY 60 capsule 3  . cetirizine (ZYRTEC) 10 MG tablet Take 10 mg by mouth once daily  . EPINEPHrine (EPIPEN) 0.3 mg/0.3 mL pen injector Inject 0.3 mLs into the muscle as needed  . fluticasone propionate (FLONASE) 50 mcg/actuation nasal spray Place 1 spray into both nostrils once daily as needed  . gabapentin (NEURONTIN) 100 MG capsule Take 100 mg by mouth once daily  . hydroCHLOROthiazide (HYDRODIURIL) 25 MG tablet TAKE ONE TABLET EVERY DAY 90 tablet 3  . HYDROcodone-acetaminophen (NORCO) 5-325 mg tablet Take 1 tablet by mouth  2 (two) times daily as needed for Pain  . melatonin 10 mg Tab Take 1 tablet by mouth nightly  . omeprazole (PRILOSEC) 20 MG DR capsule Take 20 mg by mouth once daily  . peg  400-propylene glycol (SYSTANE GEL) 0.4-0.3 % DrpG Apply 1 drop to eye once daily as needed  . rosuvastatin (CRESTOR) 40 MG tablet 40 mg once daily  . traMADoL (ULTRAM) 50 mg tablet Take 1 tablet (50 mg total) by mouth every 6 (six) hours as needed  . venlafaxine (EFFEXOR-XR) 150 MG XR capsule Take 150 mg by mouth once daily   No current facility-administered medications for this visit.   Allergies:  Allergies  Allergen Reactions  . Ace Inhibitors Angioedema    . Pravastatin Muscle Pain  . Simvastatin Muscle Pain  stiffness   Social History:  Social History   Socioeconomic History  . Marital status: Married  Spouse name: [name]  . Number of children: 1  . Years of education: 36  . Highest education level: Bachelor's degree (e.g., BA, AB, BS)  Occupational History  . Occupation: Full-time- Community education officer  Tobacco Use  . Smoking status: Former Games developer  . Smokeless tobacco: Never Used  Vaping Use  . Vaping Use: Never used  Substance and Sexual Activity  . Alcohol use: Yes  Alcohol/week: 2.0 standard drinks  Types: 2 Cans of beer per week  Comment: Ocassionally  . Drug use: Never  . Sexual activity: Yes  Birth control/protection: Post-menopausal   Family History:  Family History  Problem Relation Age of Onset  . Rheum arthritis Mother  . Aneurysm Father  . COPD Sister  . Myocardial Infarction (Heart attack) Sister  . Cancer Sister  female cancer  . Ovarian cancer Sister  . Cancer Brother  . Deep vein thrombosis (DVT or abnormal blood clot formation) Maternal Grandmother  . Deep vein thrombosis (DVT or abnormal blood clot formation) Maternal Grandfather  . Deep vein thrombosis (DVT or abnormal blood clot formation) Paternal Grandmother  . Deep vein thrombosis (DVT or abnormal blood clot formation) Paternal Grandfather  . Anesthesia problems Neg Hx  . Malignant hypertension Neg Hx  . Malignant hyperthermia Neg Hx   Review of Systems:   A 10+ ROS was performed,  reviewed, and the pertinent orthopaedic findings are documented in the HPI.   Physical Examination:   BP (!) 142/90 (BP Location: Left upper arm, Patient Position: Sitting, BP Cuff Size: Adult)  Ht 142.2 cm (4\' 8" )  Wt 69.6 kg (153 lb 6.4 oz)  LMP (LMP Unknown)  BMI 34.39 kg/m   Patient is a well-developed, well-nourished female in no acute distress. Patient has normal mood and affect. Patient is alert and oriented to person, place, and time.   HEENT: Atraumatic, normocephalic. Pupils equal and reactive to light. Extraocular motion intact. Noninjected sclera.  Cardiovascular: Regular rate and rhythm, with no murmurs, rubs, or gallops. Distal pulses palpable.  Respiratory: Lungs clear to auscultation bilaterally.   Left Knee: Soft tissue swelling: mild Effusion: none Erythema: none Crepitance: mild Tenderness: medial Alignment: relative varus Mediolateral laxity: medial pseudolaxity Posterior sag: negative Patellar tracking: Good tracking without evidence of subluxation or tilt Atrophy: No significant atrophy.  Quadriceps tone was fair to good. Range of motion: 0/0/108 degrees  Sensation intact over the saphenous, lateral sural cutaneous, superficial fibular, and deep fibular nerve distributions.  Tests Performed/Reviewed:  X-rays  3 views of the left knee were obtained. Images reveal severe loss of medial compartment joint space with near bone-on-bone contact  and osteophyte formation. No fractures or dislocations.  I personally ordered and interpreted today's x-rays.  Impression:   ICD-10-CM  1. Primary osteoarthritis of left knee M17.12   Plan:   The patient has end-stage degenerative changes of the left knee. It was explained to the patient that the condition is progressive in nature. Having failed conservative treatment, the patient has elected to proceed with a total joint arthroplasty. The patient will undergo a total joint arthroplasty with Dr. Ernest Pine. The risks  of surgery, including blood clot and infection, were discussed with the patient. Measures to reduce these risks, including the use of anticoagulation, perioperative antibiotics, and early ambulation were discussed. The importance of postoperative physical therapy was discussed with the patient. The patient elects to proceed with surgery. The patient is instructed to stop all blood thinners prior to surgery. The patient is instructed to call the hospital the day before surgery to learn of the proper arrival time.   Contact our office with any questions or concerns. Follow up as indicated, or sooner should any new problems arise, if conditions worsen, or if they are otherwise concerned.   Ashley Gardener, PA-C [facility] [address] [place], [place] [address] Phone: [phone]  This note was generated in part with voice recognition software and I apologize for any typographical errors that were not detected and corrected.  Electronically signed by Ashley Gardener, PA at 11/25/2020 6:00 PM EDT

## 2020-12-01 NOTE — Op Note (Signed)
OPERATIVE NOTE  DATE OF SURGERY:  12/01/2020  PATIENT NAME:  Ashley [name]   DOB: Feb 20, 1941  MRN: [id number]  PRE-OPERATIVE DIAGNOSIS: Degenerative arthrosis of the left knee, primary  POST-OPERATIVE DIAGNOSIS:  Same  PROCEDURE:  Left total knee arthroplasty using computer-assisted navigation  SURGEON:  Jena Gauss. M.D.  ASSISTANT: Baldwin Jamaica, PA-C (present and scrubbed throughout the case, critical for assistance with exposure, retraction, instrumentation, and closure)  ANESTHESIA: spinal  ESTIMATED BLOOD LOSS: 75 mL  FLUIDS REPLACED: 1500 mL of crystalloid  TOURNIQUET TIME: 80 minutes  DRAINS: 2 medium Hemovac drains  SOFT TISSUE RELEASES: Anterior cruciate ligament, posterior cruciate ligament, deep medial collateral ligament, patellofemoral ligament  IMPLANTS UTILIZED: DePuy Attune size 3 posterior stabilized femoral component (cemented), size 3 rotating platform tibial component (cemented), 32 mm medialized dome patella (cemented), and a 6 mm stabilized rotating platform polyethylene insert.  INDICATIONS FOR SURGERY: Ashley [name] is a 80 y.o. year old female with a long history of progressive knee pain. X-rays demonstrated severe degenerative changes in tricompartmental fashion. The patient had not seen any significant improvement despite conservative nonsurgical intervention. After discussion of the risks and benefits of surgical intervention, the patient expressed understanding of the risks benefits and agree with plans for total knee arthroplasty.   The risks, benefits, and alternatives were discussed at length including but not limited to the risks of infection, bleeding, nerve injury, stiffness, blood clots, the need for revision surgery, cardiopulmonary complications, among others, and they were willing to proceed.  PROCEDURE IN DETAIL: The patient was brought into the operating room and, after adequate spinal anesthesia was achieved, a tourniquet was placed  on the patient's upper thigh. The patient's knee and leg were cleaned and prepped with alcohol and DuraPrep and draped in the usual sterile fashion. A "timeout" was performed as per usual protocol. The lower extremity was exsanguinated using an Esmarch, and the tourniquet was inflated to 300 mmHg. An anterior longitudinal incision was made followed by a standard mid vastus approach. The deep fibers of the medial collateral ligament were elevated in a subperiosteal fashion off of the medial flare of the tibia so as to maintain a continuous soft tissue sleeve. The patella was subluxed laterally and the patellofemoral ligament was incised. Inspection of the knee demonstrated severe degenerative changes with full-thickness loss of articular cartilage. Osteophytes were debrided using a rongeur. Anterior and posterior cruciate ligaments were excised. Two 4.0 mm Schanz pins were inserted in the femur and into the tibia for attachment of the array of trackers used for computer-assisted navigation. Hip center was identified using a circumduction technique. Distal landmarks were mapped using the computer. The distal femur and proximal tibia were mapped using the computer. The distal femoral cutting guide was positioned using computer-assisted navigation so as to achieve a 5 distal valgus cut. The femur was sized and it was felt that a size 3 femoral component was appropriate. A size 3 femoral cutting guide was positioned and the anterior cut was performed and verified using the computer. This was followed by completion of the posterior and chamfer cuts. Femoral cutting guide for the central box was then positioned in the center box cut was performed.  Attention was then directed to the proximal tibia. Medial and lateral menisci were excised. The extramedullary tibial cutting guide was positioned using computer-assisted navigation so as to achieve a 0 varus-valgus alignment and 3 posterior slope. The cut was performed and  verified using the computer. The proximal tibia was  sized and it was felt that a size 3 tibial tray was appropriate. Tibial and femoral trials were inserted followed by insertion of a 6 mm polyethylene insert. This allowed for excellent mediolateral soft tissue balancing both in flexion and in full extension. Finally, the patella was cut and prepared so as to accommodate a 32 mm medialized dome patella. A patella trial was placed and the knee was placed through a range of motion with excellent patellar tracking appreciated. The femoral trial was removed after debridement of posterior osteophytes. The central post-hole for the tibial component was reamed followed by insertion of a keel punch. Tibial trials were then removed. Cut surfaces of bone were irrigated with copious amounts of normal saline using pulsatile lavage and then suctioned dry. Polymethylmethacrylate cement was prepared in the usual fashion using a vacuum mixer. Cement was applied to the cut surface of the proximal tibia as well as along the undersurface of a size 3 rotating platform tibial component. Tibial component was positioned and impacted into place. Excess cement was removed using Personal assistant. Cement was then applied to the cut surfaces of the femur as well as along the posterior flanges of the size 3 femoral component. The femoral component was positioned and impacted into place. Excess cement was removed using Personal assistant. A 6 mm polyethylene trial was inserted and the knee was brought into full extension with steady axial compression applied. Finally, cement was applied to the backside of a 32 mm medialized dome patella and the patellar component was positioned and patellar clamp applied. Excess cement was removed using Personal assistant. After adequate curing of the cement, the tourniquet was deflated after a total tourniquet time of 80 minutes. Hemostasis was achieved using electrocautery. The knee was irrigated with copious amounts  of normal saline using pulsatile lavage followed by 500 ml of Surgiphor and then suctioned dry. 20 mL of 1.3% Exparel and 60 mL of 0.25% Marcaine in 40 mL of normal saline was injected along the posterior capsule, medial and lateral gutters, and along the arthrotomy site. A 6 mm stabilized rotating platform polyethylene insert was inserted and the knee was placed through a range of motion with excellent mediolateral soft tissue balancing appreciated and excellent patellar tracking noted. 2 medium drains were placed in the wound bed and brought out through separate stab incisions. The medial parapatellar portion of the incision was reapproximated using interrupted sutures of #1 Vicryl. Subcutaneous tissue was approximated in layers using first #0 Vicryl followed #2-0 Vicryl. The skin was approximated with skin staples. A sterile dressing was applied.  The patient tolerated the procedure well and was transported to the recovery room in stable condition.    Cathern Tahir P. Angie Fava., M.D.

## 2020-12-01 NOTE — Evaluation (Signed)
Physical Therapy Evaluation Patient Details Name: Ashley [name] MRN: [id number] DOB: 11-Feb-1941 Today's Date: 12/01/2020   History of Present Illness  Pt is a 80 y.o. female s/p elective left total knee arthroplasty on 12/01/20. 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    Clinical Impression  Pt was pleasant and motivated to participate during the session and performed well during the session especially considering POD #0 status. Pt required no physical assistance during the session and was steady with good control during transfers and gait.  Pt ambulated with a step-through pattern with only minimal pain reported in the L knee with WB.  Pt reported no adverse symptoms during the session with SpO2 and HR WNL on room air. Pt will benefit from HHPT upon discharge to safely address deficits listed in patient problem list for decreased caregiver assistance and eventual return to PLOF.        Follow Up Recommendations Home health PT;Supervision for mobility/OOB    Equipment Recommendations  3in1 (PT)    Recommendations for Other Services       Precautions / Restrictions Precautions Precautions: Fall Restrictions Weight Bearing Restrictions: Yes LLE Weight Bearing: Weight bearing as tolerated Other Position/Activity Restrictions: Pt able to perform Ind LLE SLR without extensor lag, no KI required      Mobility  Bed Mobility Overal bed mobility: Modified Independent             General bed mobility comments: NT, pt in recliner, Mod Ind with OT    Transfers Overall transfer level: Needs assistance Equipment used: Rolling walker (2 wheeled) Transfers: Sit to/from Stand Sit to Stand: Min guard         General transfer comment: Min verbal cues for hand and LLE placement with use of RW  Ambulation/Gait Ambulation/Gait assistance: Min guard Gait Distance (Feet): 30 Feet Assistive device:  Rolling walker (2 wheeled) Gait Pattern/deviations: Step-through pattern;Decreased step length - right;Decreased step length - left Gait velocity: decreased   General Gait Details: Slow cadence but steady without LOB or L knee buckling; SpO2 and HR WNL on room air  Stairs            Wheelchair Mobility    Modified Rankin (Stroke Patients Only)       Balance Overall balance assessment: Needs assistance Sitting-balance support: No upper extremity supported;Feet supported Sitting balance-Leahy Scale: Good Sitting balance - Comments: Good sitting balance at EOB reaching within BOS   Standing balance support: During functional activity;Bilateral upper extremity supported Standing balance-Leahy Scale: Good Standing balance comment: Min lean on the RW for support                             Pertinent Vitals/Pain Pain Assessment: 0-10 Pain Score: 2  Pain Location: L knee Pain Descriptors / Indicators: Sore Pain Intervention(s): Premedicated before session;Monitored during session    Home Living Family/patient expects to be discharged to:: Private residence Living Arrangements: Spouse/significant other Available Help at Discharge: Family;Available 24 hours/day Type of Home: House Home Access: Stairs to enter Entrance Stairs-Rails: Can reach both;Right;Left Entrance Stairs-Number of Steps: 3-4 (front door) Home Layout: One level Home Equipment: Walker - 2 wheels;Cane - single point;Shower seat;Grab bars - tub/shower;Hand held shower head;Adaptive equipment Additional Comments: Pt's daughter will be staying with pt for several days upon discharge and spouse available 24/7 but can only offer limited physical assistance  Prior Function Level of Independence: Independent         Comments: At baseline, pt independent with ADLs/IADLs and functional mobility (w/out assistive devices). 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     Hand  Dominance        Extremity/Trunk Assessment   Upper Extremity Assessment Upper Extremity Assessment: Defer to OT evaluation RUE Deficits / Details: pt reports R rotator cuff tear, and advised by MD to not lift heavy objects.    Lower Extremity Assessment Lower Extremity Assessment: RLE deficits/detail;LLE deficits/detail RLE Deficits / Details: Strength WNL RLE Sensation: WNL RLE Coordination: WNL LLE Deficits / Details: Hip flex strength >/= 3/5 LLE: Unable to fully assess due to pain LLE Sensation: WNL LLE Coordination: WNL    Cervical / Trunk Assessment Cervical / Trunk Assessment: Normal  Communication   Communication: No difficulties  Cognition Arousal/Alertness: Awake/alert Behavior During Therapy: WFL for tasks assessed/performed Overall Cognitive Status: Within Functional Limits for tasks assessed                                        General Comments      Exercises Total Joint Exercises Ankle Circles/Pumps: AROM;Strengthening;Both;10 reps Quad Sets: AROM;Strengthening;Both;5 reps;10 reps Heel Slides: AROM;Strengthening;Left;10 reps Hip ABduction/ADduction: AROM;Strengthening;Both;10 reps Straight Leg Raises: AROM;Strengthening;Both;10 reps Long Arc Quad: AROM;Strengthening;Left;10 reps;5 reps Knee Flexion: 10 reps;5 reps;Left;Strengthening;AROM Goniometric ROM: L knee AROM: 0-72 deg Marching in Standing: AROM;Strengthening;Both;5 reps;Standing Other Exercises Other Exercises: HEP education and review per handout Other Exercises: 90 deg L turn training to prevent CKC twisting on the L knee Other Exercises: Positioning education to promote L knee ext PROM   Assessment/Plan    PT Assessment Patient needs continued PT services  PT Problem List Decreased strength;Decreased range of motion;Decreased activity tolerance;Decreased balance;Decreased mobility;Decreased knowledge of use of DME;Pain       PT Treatment Interventions DME  instruction;Gait training;Stair training;Functional mobility training;Therapeutic activities;Therapeutic exercise;Balance training;Patient/family education    PT Goals (Current goals can be found in the Care Plan section)  Acute Rehab PT Goals Patient Stated Goal: To be able to walk in the park PT Goal Formulation: With patient Time For Goal Achievement: 12/14/20 Potential to Achieve Goals: Good    Frequency BID   Barriers to discharge        Co-evaluation               AM-PAC PT "6 Clicks" Mobility  Outcome Measure Help needed turning from your back to your side while in a flat bed without using bedrails?: A Little Help needed moving from lying on your back to sitting on the side of a flat bed without using bedrails?: A Little Help needed moving to and from a bed to a chair (including a wheelchair)?: A Little Help needed standing up from a chair using your arms (e.g., wheelchair or bedside chair)?: A Little Help needed to walk in hospital room?: A Little Help needed climbing 3-5 steps with a railing? : A Little 6 Click Score: 18    End of Session Equipment Utilized During Treatment: Gait belt Activity Tolerance: Patient tolerated treatment well Patient left: in chair;with call bell/phone within reach;with chair alarm set;with SCD's reapplied;Other (comment) (polar care donned to L knee) Nurse Communication: Mobility status;Weight bearing status PT Visit Diagnosis: Other abnormalities of gait and mobility (R26.89);Muscle weakness (generalized) (M62.81);Pain Pain - Right/Left: Left Pain - part  of body: Knee    Time: 7494-4967 PT Time Calculation (min) (ACUTE ONLY): 49 min   Charges:   PT Evaluation $PT Eval Moderate Complexity: 1 Mod PT Treatments $Therapeutic Exercise: 8-22 mins $Therapeutic Activity: 8-22 mins       D. Elly Modena PT, DPT 12/01/20, 5:21 PM

## 2020-12-01 NOTE — Transfer of Care (Signed)
Immediate Anesthesia Transfer of Care Note  Patient: ANISTEN TOMASSI  Procedure(s) Performed: COMPUTER ASSISTED TOTAL KNEE ARTHROPLASTY (Left Knee)  Patient Location: PACU  Anesthesia Type:Spinal  Level of Consciousness: awake, drowsy and patient cooperative  Airway & Oxygen Therapy: Patient Spontanous Breathing and Patient connected to face mask oxygen  Post-op Assessment: Report given to RN and Post -op Vital signs reviewed and stable  Post vital signs: Reviewed and stable  Last Vitals:  Vitals Value Taken Time  BP 157/86 12/01/20 1115  Temp 36.8 C 12/01/20 1112  Pulse 99 12/01/20 1117  Resp    SpO2 99 % 12/01/20 1117  Vitals shown include unvalidated device data.  Last Pain:  Vitals:   12/01/20 1112  TempSrc:   PainSc: 0-No pain      Patients Stated Pain Goal: 0 (12/01/20 5956)  Complications: No complications documented.

## 2020-12-01 NOTE — Evaluation (Signed)
Occupational Therapy Evaluation Patient Details Name: Ashley [name] MRN: [id number] DOB: 12/17/1940 Today's Date: 12/01/2020    History of Present Illness 80 y.o. female s/p left total knee arthroplasty on 12/01/20. PMH: L3-L5 laminectomy, right carpal tunnel release, HTN, HLD, depression, GERD   Clinical Impression   Pt seen for OT evaluation this date, POD#0 from above surgery. Prior to surgery, pt was independent with ADLs/IADLs and functional mobility (w/out assistive devices). Pt works, drives, and is the primary caregiver for her husband who has dementia and Parkinson's Disease. Pt currently presents with L knee pain, limited AROM of L knee, and decreased balance, requiring MOD assist for seated LB dressing, MIN GUARD for functional mobility with RW and toilet transfers, and SUPERVISION for standing grooming tasks. Pt instructed in polar care mgt, home/routines modifications, and DME/AE for LB bathing and dressing tasks; handout provided. Pt would benefit from additional skilled OT services, including additional instruction in techniques with or without assistive devices for dressing and bathing skills to support recall and carryover prior to discharge and ultimately to maximize safety, independence, and minimize falls risk and caregiver burden. Upon discharge, recommend home health OT.     Follow Up Recommendations  Home health OT    Equipment Recommendations  None recommended by OT       Precautions / Restrictions Precautions Precautions: Fall Restrictions Weight Bearing Restrictions: Yes LLE Weight Bearing: Weight bearing as tolerated      Mobility Bed Mobility Overal bed mobility: Modified Independent             General bed mobility comments: Able to perform with HOB elevated, use of bedrails, and increased time/effort    Transfers Overall transfer level: Needs assistance Equipment used: Rolling walker (2 wheeled) Transfers: Sit to/from Stand Sit to Stand: Min  guard         General transfer comment: verbal cues for hand and LLE placement with use of RW    Balance Overall balance assessment: Needs assistance Sitting-balance support: No upper extremity supported;Feet supported Sitting balance-Leahy Scale: Good Sitting balance - Comments: Good sitting balance at EOB reaching within BOS   Standing balance support: No upper extremity supported;During functional activity Standing balance-Leahy Scale: Fair Standing balance comment: Fair standing balance while performing standing hand hygiene                           ADL either performed or assessed with clinical judgement   ADL Overall ADL's : Needs assistance/impaired     Grooming: Wash/dry hands;Supervision/safety;Standing               Lower Body Dressing: Moderate assistance;Sitting/lateral leans Lower Body Dressing Details (indicate cue type and reason): MOD A to don/doff socks d/t limited ROM of LLE Toilet Transfer: Min guard;Ambulation;Regular Toilet;BSC Toilet Transfer Details (indicate cue type and reason): Verbal cues for hand and LLE placement Toileting- Clothing Manipulation and Hygiene: Supervision/safety;Sitting/lateral lean       Functional mobility during ADLs: Min guard;Rolling walker (verbal cues for RW management)       Vision Baseline Vision/History: Wears glasses Wears Glasses: At all times Patient Visual Report: No change from baseline              Pertinent Vitals/Pain Pain Assessment: 0-10 Pain Score: 3  Pain Location: L knee Pain Descriptors / Indicators: Aching Pain Intervention(s): Limited activity within patient's tolerance;Monitored during session        Extremity/Trunk Assessment Upper Extremity Assessment Upper Extremity Assessment:  RUE deficits/detail;Overall WFL for tasks assessed (LUE WFL) RUE Deficits / Details: pt reports R rotator tuff tear, and advised by MD to not lift heavy objects. Able to perform shoulder flexion  to >90degrees.   Lower Extremity Assessment Lower Extremity Assessment: Defer to PT evaluation   Cervical / Trunk Assessment Cervical / Trunk Assessment: Normal   Communication Communication Communication: No difficulties   Cognition Arousal/Alertness: Awake/alert Behavior During Therapy: WFL for tasks assessed/performed Overall Cognitive Status: Within Functional Limits for tasks assessed                                        Exercises Other Exercises Other Exercises: Pt instructed in polar care mgt, home/routines modifications, and DME/AE for LB bathing and dressing tasks; handout provided.        Home Living Family/patient expects to be discharged to:: Private residence Living Arrangements: Spouse/significant other Available Help at Discharge: Family;Available PRN/intermittently (Spouse has dementia, PD, however can provide light physical assist. Daughter plans to stay with her for a few days to assist) Type of Home: House Home Access: Stairs to enter [employer] of Steps: 3-4 (front door) Entrance Stairs-Rails: Can reach both Home Layout: One level     Bathroom Shower/Tub: Other (comment) (walk-in bath)   Bathroom Toilet: Standard     Home Equipment: Walker - 2 wheels;Cane - single point;Shower seat;Grab bars - tub/shower;Hand held shower head;Adaptive equipment Adaptive Equipment: Reacher (uses reacher for laundry) Additional Comments: Pt has access to husband's RW if needed.      Prior Functioning/Environment Level of Independence: Independent        Comments: At baseline, pt independent with ADLs/IADLs and functional mobility (w/out assistive devices). Pt works, drives, and is the primary caregiver for her husband        OT Problem List: Decreased strength;Decreased range of motion;Decreased activity tolerance;Impaired balance (sitting and/or standing);Pain      OT Treatment/Interventions: Self-care/ADL training;Therapeutic  exercise;Energy conservation;DME and/or AE instruction;Therapeutic activities;Patient/family education;Balance training    OT Goals(Current goals can be found in the care plan section) Acute Rehab OT Goals Patient Stated Goal: to go home OT Goal Formulation: With patient Time For Goal Achievement: 12/15/20 Potential to Achieve Goals: Good ADL Goals Pt Will Perform Grooming: with modified independence;standing Pt Will Perform Lower Body Dressing: with modified independence;with adaptive equipment;sit to/from stand Pt Will Transfer to Toilet: with modified independence;ambulating;regular height toilet  OT Frequency: Min 1X/week    AM-PAC OT "6 Clicks" Daily Activity     Outcome Measure Help from another person eating meals?: None Help from another person taking care of personal grooming?: A Little Help from another person toileting, which includes using toliet, bedpan, or urinal?: A Little Help from another person bathing (including washing, rinsing, drying)?: A Lot Help from another person to put on and taking off regular upper body clothing?: A Little Help from another person to put on and taking off regular lower body clothing?: A Lot 6 Click Score: 17   End of Session Equipment Utilized During Treatment: Rolling walker Nurse Communication: Mobility status  Activity Tolerance: Patient tolerated treatment well Patient left: in chair;with call bell/phone within reach  OT Visit Diagnosis: Unsteadiness on feet (R26.81)                Time: 1610-9604 OT Time Calculation (min): 40 min Charges:  OT General Charges $OT Visit: 1 Visit OT Evaluation $OT Eval  Moderate Complexity: 1 Mod OT Treatments $Self Care/Home Management : 23-37 mins  Matthew Folks, OTR/L [facility] [phone]

## 2020-12-01 NOTE — H&P (Signed)
The patient has been re-examined, and the chart reviewed, and there have been no interval changes to the documented history and physical.    The risks, benefits, and alternatives have been discussed at length. The patient expressed understanding of the risks benefits and agreed with plans for surgical intervention.  [name] P. Geneveive Furness, Jr. M.D.    

## 2020-12-01 NOTE — Anesthesia Preprocedure Evaluation (Signed)
Anesthesia Evaluation  Patient identified by MRN, date of birth, ID band Patient awake    Reviewed: Allergy & Precautions, H&P , NPO status , Patient's Chart, lab work & pertinent test results  Airway Mallampati: II  TM Distance: >3 FB Neck ROM: full    Dental  (+) Chipped, Teeth Intact   Pulmonary neg COPD, former smoker,    Pulmonary exam normal        Cardiovascular hypertension, (-) angina(-) Past MI Normal cardiovascular exam     Neuro/Psych PSYCHIATRIC DISORDERS Anxiety Depression CVA, No Residual Symptoms    GI/Hepatic Neg liver ROS, GERD  ,  Endo/Other  negative endocrine ROS  Renal/GU negative Renal ROS  negative genitourinary   Musculoskeletal  (+) Arthritis ,   Abdominal   Peds  Hematology negative hematology ROS (+)   Anesthesia Other Findings Past Medical History: No date: Abnormal uterine bleeding (AUB)     Comment:  a. benign endometrial polyps on D+C in 03/2017 No date: Anxiety No date: Arthritis No date: Chicken pox No date: Depression No date: GERD (gastroesophageal reflux disease) No date: History of stress test     Comment:  a. 08/2017 Lexiscan MV: EF 55-65%. No ischemia/infarct.               Low risk. 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 Dx in setting of dizziness/vertigo; b. 02/2018 Carotid              U/S: bilateral intimal thickening and atherosclerotic               plaque. No hemodynamically significant stenoses; c.               03/2018 MRI Brain: No mass or abnl enhancement. Chronic               microvascular ischemic changes and mod volume loss of the              brain. 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 No date: Stroke (HCC)     Comment:  PT STATES SHE WAS TOLD THIS BY HER PCP BUT PT STATES SHE              NEVER KNEW SHE HAD A STROKE-NO RESIDUAL EFFECTS No date: Vertigo  Past  Surgical History: No date: BACK SURGERY 07/30/2019: CARPAL TUNNEL RELEASE; Right 05/15/2019: CYSTOSCOPY MACROPLASTIQUE IMPLANT; N/A     Comment:  Procedure: CYSTOSCOPY MACROPLASTIQUE IMPLANT;  Surgeon:               Orson Ape, MD;  Location: [facility] ORS;  Service:               Urology;  Laterality: N/A; 03/30/2017: DILATATION & CURETTAGE/HYSTEROSCOPY WITH MYOSURE; N/A     Comment:  Procedure: DILATATION & CURETTAGE/HYSTEROSCOPY WITH               MYOSURE;  Surgeon: Schermerhorn, Ihor Austin, MD;  Location:              [facility] ORS;  Service: Gynecology;  Laterality: N/A; No date: EYE SURGERY; Bilateral     Comment:  Cataract Extraction with IOL No date: SKIN LESION EXCISION No date: TONSILLECTOMY AND ADENOIDECTOMY     Comment:  Age 80  BMI    Body Mass Index: 31.35 kg/m  Reproductive/Obstetrics negative OB ROS                             Anesthesia Physical  Anesthesia Plan  ASA: II  Anesthesia Plan: Spinal   Post-op Pain Management:    Induction: Intravenous  PONV Risk Score and Plan:   Airway Management Planned: Nasal Cannula  Additional Equipment:   Intra-op Plan:   Post-operative Plan:   Informed Consent: I have reviewed the patients History and Physical, chart, labs and discussed the procedure including the risks, benefits and alternatives for the proposed anesthesia with the patient or authorized representative who has indicated his/her understanding and acceptance.     Dental Advisory Given  Plan Discussed with: Anesthesiologist, CRNA and Surgeon  Anesthesia Plan Comments:         Anesthesia Quick Evaluation

## 2020-12-01 NOTE — Progress Notes (Signed)
Pt ambulated to bathroom with nurse tech. No complaint of pain or discomfort

## 2020-12-02 MED ORDER — ENOXAPARIN SODIUM 30 MG/0.3ML ~~LOC~~ SOLN: 40.0000 mg | SUBCUTANEOUS | 0 refills | Status: DC

## 2020-12-02 MED ORDER — TRAMADOL HCL 50 MG PO TABS: 50.0000 mg | ORAL_TABLET | ORAL | 0 refills | Status: DC | PRN

## 2020-12-02 NOTE — Anesthesia Postprocedure Evaluation (Signed)
Anesthesia Post Note  Patient: Ashley [name]  Procedure(s) Performed: COMPUTER ASSISTED TOTAL KNEE ARTHROPLASTY (Left Knee)  Patient location during evaluation: Nursing Unit Anesthesia Type: Spinal Level of consciousness: awake and oriented Pain management: pain level controlled Vital Signs Assessment: post-procedure vital signs reviewed and stable Respiratory status: respiratory function stable Cardiovascular status: stable Postop Assessment: no backache, no headache, patient able to bend at knees, no apparent nausea or vomiting, able to ambulate and adequate PO intake Anesthetic complications: no   No complications documented.   Last Vitals:  Vitals:   12/01/20 2004 12/02/20 0425  BP: 131/63 126/71  Pulse: 94 79  Resp: 17 17  Temp: 36.7 C 36.6 C  SpO2: 96% 99%    Last Pain:  Vitals:   12/01/20 2342  TempSrc:   PainSc: 0-No pain                 Zachary George

## 2020-12-02 NOTE — Progress Notes (Signed)
Patient discharged home with family. Went over all discharge instructions with patient and showed understanding and had no questions at this time. Took IV out of left wrist without complications. Took home all belongings and DME.

## 2020-12-02 NOTE — Progress Notes (Signed)
  Subjective: 1 Day Post-Op Procedure(s) (LRB): COMPUTER ASSISTED TOTAL KNEE ARTHROPLASTY (Left) Patient reports pain as well-controlled.   Patient is well, and has had no acute complaints or problems Plan is to go Home after hospital stay. Negative for chest pain and shortness of breath Fever: no Gastrointestinal: negative for nausea and vomiting.   Patient has not had a bowel movement.  Objective: Vital signs in last 24 hours: Temp:  [97 F (36.1 C)-98.6 F (37 C)] 97.8 F (36.6 C) (04/28 0425) Pulse Rate:  [79-98] 79 (04/28 0425) Resp:  [16-17] 17 (04/28 0425) BP: (126-157)/(61-92) 126/71 (04/28 0425) SpO2:  [93 %-100 %] 99 % (04/28 0425)  Intake/Output from previous day:  Intake/Output Summary (Last 24 hours) at 12/02/2020 0800 Last data filed at 12/02/2020 0601 Gross per 24 hour  Intake 2712.55 ml  Output 595 ml  Net 2117.55 ml    Intake/Output this shift: No intake/output data recorded.  Labs: No results for input(s): HGB in the last 72 hours. No results for input(s): WBC, RBC, HCT, PLT in the last 72 hours. No results for input(s): NA, K, CL, CO2, BUN, CREATININE, GLUCOSE, CALCIUM in the last 72 hours. No results for input(s): LABPT, INR in the last 72 hours.   EXAM General - Patient is Alert, Appropriate and Oriented Extremity - Neurovascular intact Dorsiflexion/Plantar flexion intact Compartment soft Dressing/Incision -Postoperative dressing remains in place., Polar Care in place and working. , Hemovac in place.  Motor Function - intact, moving foot and toes well on exam.  Cardiovascular- Regular rate and rhythm, no murmurs/rubs/gallops Respiratory- Lungs clear to auscultation bilaterally Gastrointestinal- soft, nontender and active bowel sounds   Assessment/Plan: 1 Day Post-Op Procedure(s) (LRB): COMPUTER ASSISTED TOTAL KNEE ARTHROPLASTY (Left) Active Problems:   Total knee replacement status  Estimated body mass index is 31.31 kg/m as calculated  from the following:   Height as of this encounter: 4\' 11"  (1.499 m).   Weight as of this encounter: 70.3 kg. Advance diet Up with therapy  Consider discharge this PM pending completion of therapy goals.   DVT Prophylaxis - Lovenox, Ted hose and foot pumps Weight-Bearing as tolerated to left leg  , PA-C [facility] 12/02/2020, 8:00 AM

## 2020-12-02 NOTE — Discharge Summary (Signed)
Physician Discharge Summary  Patient ID: Ashley [name] MRN: [id number] DOB/AGE: [birth year] 80 y.o.  Admit date: 12/01/2020 Discharge date: 12/02/2020  Admission Diagnoses:  Total knee replacement status [Z96.659]  Surgeries:Procedure(s):  Left total knee arthroplasty using computer-assisted navigation  SURGEON:  Jena Gauss. M.D.  ASSISTANT: Baldwin Jamaica, PA-C (present and scrubbed throughout the case, critical for assistance with exposure, retraction, instrumentation, and closure)  ANESTHESIA: spinal  ESTIMATED BLOOD LOSS: 75 mL  FLUIDS REPLACED: 1500 mL of crystalloid  TOURNIQUET TIME: 80 minutes  DRAINS: 2 medium Hemovac drains  SOFT TISSUE RELEASES: Anterior cruciate ligament, posterior cruciate ligament, deep medial collateral ligament, patellofemoral ligament  IMPLANTS UTILIZED: DePuy Attune size 3 posterior stabilized femoral component (cemented), size 3 rotating platform tibial component (cemented), 32 mm medialized dome patella (cemented), and a 6 mm stabilized rotating platform polyethylene insert.  Discharge Diagnoses: Patient Active Problem List   Diagnosis Date Noted  . Endometrial polyp 12/01/2020  . Pelvic pain in female 12/01/2020  . Total knee replacement status 12/01/2020  . Shoulder strain, right, initial encounter 09/07/2020  . UTI (urinary tract infection) 08/18/2020  . Right foot pain 08/09/2020  . Primary osteoarthritis of left knee 03/30/2020  . Tachycardia 02/03/2020  . Carpal tunnel syndrome 05/07/2019  . Stress incontinence 05/07/2019  . Bronchitis 05/03/2019  . Allergic reaction 03/14/2019  . Left knee pain 09/20/2018  . Hypertriglyceridemia 08/28/2018  . Partial tear of left rotator cuff 08/28/2018  . Plantar fasciitis of right foot 05/17/2018  . De Quervain's tenosynovitis, right 05/17/2018  . Bilateral knee pain 04/23/2018  . Left arm pain 03/18/2018  . Spinal stenosis of lumbar region with radiculopathy 11/12/2017  .  Bilateral leg edema 10/11/2017  . Prediabetes 10/11/2017  . Chronic low back pain 10/11/2017  . Vertigo 10/11/2017  . Post-menopausal bleeding 07/09/2017  . Neck pain 07/09/2017  . Atypical chest pain 04/24/2017  . Jaw pain 04/24/2017  . Family history of ovarian cancer 01/16/2017  . Essential hypertension 01/16/2017  . GERD (gastroesophageal reflux disease) 01/16/2017  . Hyperglycemia 06/19/2016  . Allergic angioedema 04/18/2016  . Depression 12/28/2014  . Hyperlipidemia 12/28/2014  . Major depression in remission (HCC) 12/28/2014  . Pure hypercholesterolemia 12/28/2014    Past Medical History:  Diagnosis Date  . Abnormal uterine bleeding (AUB)    a. benign endometrial polyps on D+C in 03/2017  . Anxiety   . Arthritis   . Carpal tunnel syndrome of left wrist   . Chicken pox   . Depression   . GERD (gastroesophageal reflux disease)   . History of stress test    a. 08/2017 Lexiscan MV: EF 55-65%. No ischemia/infarct. Low risk.  Marland Kitchen Hyperlipidemia   . Hypertension   . Lacunar infarction (HCC)    a. Dx in setting of dizziness/vertigo; b. 02/2018 Carotid U/S: bilateral intimal thickening and atherosclerotic plaque. No hemodynamically significant stenoses; c. 03/2018 MRI Brain: No mass or abnl enhancement. Chronic microvascular ischemic changes and mod volume loss of the brain. 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  . Stroke (HCC)    PT STATES SHE WAS TOLD THIS BY HER PCP BUT PT STATES SHE NEVER KNEW SHE HAD A STROKE-NO RESIDUAL EFFECTS  . Vertigo      Transfusion:    Consultants (if any):   Discharged Condition: Improved  Hospital Course: KELINA BEAUCHAMP is an 80 y.o. female who was admitted 12/01/2020 with a diagnosis of left knee osteoarthritis and went to the operating room on  12/01/2020 and underwent left total knee arthoplasty. The patient received perioperative antibiotics for prophylaxis (see below). The patient tolerated the procedure well and was  transported to PACU in stable condition. After meeting PACU criteria, the patient was subsequently transferred to the Orthopaedics/Rehabilitation unit.   The patient received DVT prophylaxis in the form of early mobilization, Lovenox, Foot Pumps and TED hose. A sacral pad had been placed and heels were elevated off of the bed with rolled towels in order to protect skin integrity. Foley catheter was discontinued on postoperative day #0. Wound drains were discontinued on postoperative day #1. The surgical incision was healing well without signs of infection.  Physical therapy was initiated postoperatively for transfers, gait training, and strengthening. Occupational therapy was initiated for activities of daily living and evaluation for assisted devices. Rehabilitation goals were reviewed in detail with the patient. The patient made steady progress with physical therapy and physical therapy recommended discharge to Home.   The patient achieved the preliminary goals of this hospitalization and was felt to be medically and orthopaedically appropriate for discharge.  She was given perioperative antibiotics:  Anti-infectives (From admission, onward)   Start     Dose/Rate Route Frequency Ordered Stop   12/01/20 1315  ceFAZolin (ANCEF) IVPB 2g/100 mL premix        2 g 200 mL/hr over 30 Minutes Intravenous Every 6 hours 12/01/20 1221 12/01/20 2336   12/01/20 0635  ceFAZolin (ANCEF) 2-4 GM/100ML-% IVPB       Note to Pharmacy: Register, Karen   : cabinet override      12/01/20 0635 12/01/20 0747   12/01/20 0600  ceFAZolin (ANCEF) IVPB 2g/100 mL premix        2 g 200 mL/hr over 30 Minutes Intravenous On call to O.R. 11/30/20 2309 12/01/20 4128    .  Recent vital signs:  Vitals:   12/02/20 0425 12/02/20 0808  BP: 126/71 (!) 145/79  Pulse: 79 70  Resp: 17 18  Temp: 97.8 F (36.6 C) 98.6 F (37 C)  SpO2: 99% 99%    Recent laboratory studies:  No results for input(s): WBC, HGB, HCT, PLT, K, CL,  CO2, BUN, CREATININE, GLUCOSE, CALCIUM, LABPT, INR in the last 72 hours.  Diagnostic Studies: DG Knee Left Port  Result Date: 12/01/2020 CLINICAL DATA:  Postop knee replacement EXAM: PORTABLE LEFT KNEE - 1-2 VIEW COMPARISON:  09/20/2018 FINDINGS: Left knee replacement in satisfactory position and alignment. Drain in the suprapatellar bursa. No fracture or complication. IMPRESSION: Satisfactory left knee replacement. Electronically Signed   By: Marlan Palau M.D.   On: 12/01/2020 11:35    Discharge Medications:   Allergies as of 12/02/2020      Reactions   Ace Inhibitors Other (See Comments)   Angioedema   Pravastatin Other (See Comments)   Other reaction(s): Muscle Pain   Simvastatin Other (See Comments)   stiffness      Medication List    STOP taking these medications   aspirin EC 81 MG tablet     TAKE these medications   acetaminophen 650 MG CR tablet Commonly known as: TYLENOL Take 1,300 mg by mouth every 8 (eight) hours as needed for pain.   azelastine 0.05 % ophthalmic solution Commonly known as: OPTIVAR Place 1 drop into both eyes daily as needed (itching eyes).   celecoxib 200 MG capsule Commonly known as: CELEBREX Take 200 mg by mouth 2 (two) times daily.   cetirizine 10 MG tablet Commonly known as: ZYRTEC Take 10 mg  by mouth daily.   enoxaparin 30 MG/0.3ML injection Commonly known as: LOVENOX Inject 0.4 mLs (40 mg total) into the skin daily for 14 days.   EPINEPHrine 0.3 mg/0.3 mL Soaj injection Commonly known as: EpiPen 2-Pak Inject 0.3 mLs (0.3 mg total) into the muscle as needed for anaphylaxis.   fluticasone 50 MCG/ACT nasal spray Commonly known as: FLONASE Place 2 sprays into both nostrils daily. What changed: when to take this   gabapentin 100 MG capsule Commonly known as: NEURONTIN TAKE 1 CAPSULE BY MOUTH AT BEDTIME   hydrochlorothiazide 25 MG tablet Commonly known as: HYDRODIURIL TAKE 1 TABLET BY MOUTH DAILY   HYDROcodone-acetaminophen  5-325 MG tablet Commonly known as: NORCO/VICODIN Take 1 tablet by mouth 2 (two) times daily as needed for pain.   Melatonin 10 MG Tabs Take 10 mg by mouth at bedtime.   omeprazole 20 MG capsule Commonly known as: PRILOSEC TAKE 1 CAPSULE BY MOUTH ONCE DAILY   rosuvastatin 40 MG tablet Commonly known as: CRESTOR TAKE 1 TABLET BY MOUTH ONCE DAILY   Systane 0.4-0.3 % Soln Generic drug: Polyethyl Glycol-Propyl Glycol Place 1 drop into both eyes 3 (three) times daily as needed (dry/irritated eyes.).   traMADol 50 MG tablet Commonly known as: ULTRAM Take 25-50 mg by mouth 2 (two) times daily as needed for moderate pain. What changed: Another medication with the same name was added. Make sure you understand how and when to take each.   traMADol 50 MG tablet Commonly known as: ULTRAM Take 1-2 tablets (50-100 mg total) by mouth every 4 (four) hours as needed for moderate pain. What changed: You were already taking a medication with the same name, and this prescription was added. Make sure you understand how and when to take each.   venlafaxine XR 150 MG 24 hr capsule Commonly known as: EFFEXOR-XR TAKE 1 CAPSULE BY MOUTH EVERY DAY What changed:   how much to take  when to take this            Durable Medical Equipment  (From admission, onward)         Start     Ordered   12/01/20 1222  DME Walker rolling  Once       Question:  Patient needs a walker to treat with the following condition  Answer:  Total knee replacement status   12/01/20 1221   12/01/20 1222  DME Bedside commode  Once       Question:  Patient needs a bedside commode to treat with the following condition  Answer:  Total knee replacement status   12/01/20 1221          Disposition: Home with home health PT     Follow-up Information    Myrt[facility] Ser On 12/16/2020.   Specialty: Orthopedic Surgery Why: at 1:15pm Contact information: [address] [facility] [place] [address] [phone]        Donato Heinz, MD On 01/18/2021.   Specialty: Orthopedic Surgery Why: at 2:45pm Contact information: [address] [facility] [place] [address] [phone]                Lasandra Beech, PA-C 12/02/2020, 12:56 PM

## 2020-12-02 NOTE — Progress Notes (Signed)
Physical Therapy Treatment Patient Details Name: Ashley [name] MRN: [id number] DOB: Mar 04, 1941 Today's Date: 12/02/2020    History of Present Illness Pt is a 80 y.o. female s/p elective left total knee arthroplasty on 12/01/20. 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    PT Comments    Pt was sitting in recliner upon arriving. She is A and O x 4 and extremely motivated. Easily able to stand and ambulate 200 ft. Performed ascending/descending 4 stair with supervision. Achieved 78 degrees flexion. Pt has met all acute PT goals and is cleared form PT standpoint for safe DC home. Recommend HHPT at DC to continue to assist pt to PLOF. RN aware of pt's abilities.     Follow Up Recommendations  Home health PT     Equipment Recommendations  3in1 (PT)       Precautions / Restrictions Precautions Precautions: Fall;Knee Restrictions Weight Bearing Restrictions: Yes LLE Weight Bearing: Weight bearing as tolerated    Mobility  Bed Mobility Overal bed mobility: Modified Independent   Transfers Overall transfer level: Modified independent    Ambulation/Gait Ambulation/Gait assistance: Supervision Gait Distance (Feet): 200 Feet Assistive device: Rolling walker (2 wheeled) Gait Pattern/deviations: Step-through pattern;Decreased step length - right;Decreased step length - left Gait velocity: WNL   General Gait Details: no LOB or unsteadiness       Balance Overall balance assessment: Needs assistance Sitting-balance support: No upper extremity supported;Feet supported Sitting balance-Leahy Scale: Good Sitting balance - Comments: Good sitting balance at EOB reaching within BOS   Standing balance support: During functional activity;Bilateral upper extremity supported Standing balance-Leahy Scale: Good         Cognition Arousal/Alertness: Awake/alert Behavior During Therapy: WFL for tasks  assessed/performed Overall Cognitive Status: Within Functional Limits for tasks assessed      General Comments: Pt was A and O x 4. Extremely pleasant and motivated      Exercises Total Joint Exercises Ankle Circles/Pumps: AROM;Strengthening;Both;10 reps Quad Sets: AROM;Strengthening;Both;5 reps;10 reps Heel Slides: AROM;Strengthening;Left;10 reps Hip ABduction/ADduction: AROM;Strengthening;Both;10 reps Straight Leg Raises: AROM;Strengthening;Both;10 reps Goniometric ROM: 78 degrees flexion        Pertinent Vitals/Pain Pain Assessment: 0-10 Pain Score: 1  Pain Location: L knee Pain Descriptors / Indicators: Sore Pain Intervention(s): Limited activity within patient's tolerance;Monitored during session;Premedicated before session;Repositioned           PT Goals (current goals can now be found in the care plan section) Acute Rehab PT Goals Patient Stated Goal: go home and return tip top shape Progress towards PT goals: Progressing toward goals    Frequency    BID      PT Plan Current plan remains appropriate       AM-PAC PT "6 Clicks" Mobility   Outcome Measure  Help needed turning from your back to your side while in a flat bed without using bedrails?: A Little Help needed moving from lying on your back to sitting on the side of a flat bed without using bedrails?: A Little Help needed moving to and from a bed to a chair (including a wheelchair)?: A Little Help needed standing up from a chair using your arms (e.g., wheelchair or bedside chair)?: A Little Help needed to walk in hospital room?: A Little Help needed climbing 3-5 steps with a railing? : A Little 6 Click Score: 18    End of Session Equipment Utilized During Treatment: Gait belt Activity Tolerance: Patient tolerated treatment well  Patient left: in chair;with call bell/phone within reach;with chair alarm set;with SCD's reapplied;Other (comment) Nurse Communication: Mobility status;Weight bearing  status PT Visit Diagnosis: Other abnormalities of gait and mobility (R26.89);Muscle weakness (generalized) (M62.81);Pain Pain - Right/Left: Left Pain - part of body: Knee     Time: 1030-1055 PT Time Calculation (min) (ACUTE ONLY): 25 min  Charges:  $Gait Training: 8-22 mins $Therapeutic Exercise: 8-22 mins                     Julaine Fusi PTA 12/02/20, 11:20 AM

## 2020-12-02 NOTE — Plan of Care (Signed)
Post op dressing removed. Hemovac removed. Mini compression dressing and fresh honeycomb applied.

## 2020-12-02 NOTE — Progress Notes (Signed)
Met with the patient to discuss DC plan and needs She lives at home with her spouse, her daughter will be staying with her for a while, she has a RW and a shower seat and grab bars at home, she needs a 3 in 1, notified Rhonda at [facility], She is set up with [facility] for [facility] PT Has transportation and can afford medications

## 2020-12-03 ENCOUNTER — Encounter: Payer: Self-pay | Admitting: Orthopedic Surgery

## 2021-01-07 ENCOUNTER — Ambulatory Visit: Payer: Medicare HMO | Admitting: Family Medicine

## 2021-01-07 ENCOUNTER — Other Ambulatory Visit: Payer: Self-pay | Admitting: Family Medicine

## 2021-01-07 DIAGNOSIS — G2581 Restless legs syndrome: Secondary | ICD-10-CM

## 2021-01-18 DIAGNOSIS — H1045 Other chronic allergic conjunctivitis: Secondary | ICD-10-CM | POA: Insufficient documentation

## 2021-01-18 DIAGNOSIS — J309 Allergic rhinitis, unspecified: Secondary | ICD-10-CM | POA: Insufficient documentation

## 2021-01-24 ENCOUNTER — Other Ambulatory Visit: Payer: Self-pay

## 2021-01-24 ENCOUNTER — Ambulatory Visit (INDEPENDENT_AMBULATORY_CARE_PROVIDER_SITE_OTHER): Payer: Medicare HMO | Admitting: Family Medicine

## 2021-01-24 ENCOUNTER — Encounter: Payer: Self-pay | Admitting: Family Medicine

## 2021-01-24 DIAGNOSIS — I10 Essential (primary) hypertension: Secondary | ICD-10-CM | POA: Diagnosis not present

## 2021-01-24 DIAGNOSIS — F32A Depression, unspecified: Secondary | ICD-10-CM | POA: Diagnosis not present

## 2021-01-24 DIAGNOSIS — K219 Gastro-esophageal reflux disease without esophagitis: Secondary | ICD-10-CM

## 2021-01-24 DIAGNOSIS — Z96652 Presence of left artificial knee joint: Secondary | ICD-10-CM | POA: Diagnosis not present

## 2021-01-24 MED ORDER — BUPROPION HCL ER (XL) 150 MG PO TB24: 150.0000 mg | ORAL_TABLET | Freq: Every day | ORAL | 1 refills | Status: DC

## 2021-01-24 NOTE — Patient Instructions (Signed)
Nice to see you. Will start Wellbutrin for your depression.  He will continue on Effexor.

## 2021-01-24 NOTE — Assessment & Plan Note (Signed)
Reports she is doing well after knee replacement.

## 2021-01-24 NOTE — Progress Notes (Signed)
[name], MD Phone: [phone]  Ashley [name] is a 80 y.o. female who presents today for f/u.  HYPERTENSION Disease Monitoring Home BP Monitoring not checking Chest pain- no    Dyspnea- no Medications Compliance-  taking HCTZ.  Edema- no  GERD:   Reflux symptoms: no   Abd pain: no   Blood in stool: no  Dysphagia: no   EGD: no  Medication: omeprazole, reports being on this for quite a while No family history of gastric cancer. No weight loss.   Depression: Patient notes she is more depressed than she has been in the past.  She has a lot of difficulty as her husband has dementia and Parkinson's.  It is hard to deal with at times.  She has been on Effexor for many years and wonders if it is still effective.  No SI.  She notes no seizure history.  Depression screen [facility] 2/9 01/24/2021 05/04/2020 04/30/2019 04/25/2019 03/14/2019  Decreased Interest 2 0 0 0 0  Down, Depressed, Hopeless 2 0 0 0 0  PHQ - 2 Score 4 0 0 0 0  Altered sleeping 2 - - - -  Tired, decreased energy 1 - - - -  Change in appetite 1 - - - -  Feeling bad or failure about yourself  0 - - - -  Trouble concentrating 1 - - - -  Moving slowly or fidgety/restless 0 - - - -  Suicidal thoughts 0 - - - -  PHQ-9 Score 9 - - - -  Difficult doing work/chores Somewhat difficult - - - -   She is status post left knee replacement.  She reports she is doing well.  Social History   Tobacco Use  Smoking Status Former   Packs/day: 0.50   Years: 15.00   Pack years: 7.50   Types: Cigarettes   Quit date: 08/07/1976   Years since quitting: 44.4  Smokeless Tobacco Never    Current Outpatient Medications on File Prior to Visit  Medication Sig Dispense Refill   acetaminophen (TYLENOL) 650 MG CR tablet Take 1,300 mg by mouth every 8 (eight) hours as needed for pain.     azelastine (OPTIVAR) 0.05 % ophthalmic solution Place 1 drop into both eyes daily as needed (itching eyes).     celecoxib (CELEBREX) 200 MG capsule Take  200 mg by mouth 2 (two) times daily.     cetirizine (ZYRTEC) 10 MG tablet Take 10 mg by mouth daily.     EPINEPHrine (EPIPEN 2-PAK) 0.3 mg/0.3 mL IJ SOAJ injection Inject 0.3 mLs (0.3 mg total) into the muscle as needed for anaphylaxis. 1 each 1   fluticasone (FLONASE) 50 MCG/ACT nasal spray Place 2 sprays into both nostrils daily. (Patient taking differently: Place 2 sprays into both nostrils at bedtime.) 16 g 0   gabapentin (NEURONTIN) 100 MG capsule TAKE 1 CAPSULE BY MOUTH AT BEDTIME 90 capsule 1   hydrochlorothiazide (HYDRODIURIL) 25 MG tablet TAKE 1 TABLET BY MOUTH DAILY 90 tablet 1   HYDROcodone-acetaminophen (NORCO/VICODIN) 5-325 MG tablet Take 1 tablet by mouth 2 (two) times daily as needed for pain.     Melatonin 10 MG TABS Take 10 mg by mouth at bedtime.     omeprazole (PRILOSEC) 20 MG capsule TAKE 1 CAPSULE BY MOUTH ONCE DAILY (Patient taking differently: Take 20 mg by mouth daily.) 30 capsule 5   Polyethyl Glycol-Propyl Glycol (SYSTANE) 0.4-0.3 % SOLN Place 1 drop into both eyes 3 (three) times daily as needed (dry/irritated  eyes.).     rosuvastatin (CRESTOR) 40 MG tablet TAKE 1 TABLET BY MOUTH ONCE DAILY (Patient taking differently: Take 40 mg by mouth daily.) 90 tablet 0   venlafaxine XR (EFFEXOR-XR) 150 MG 24 hr capsule TAKE 1 CAPSULE BY MOUTH EVERY DAY (Patient taking differently: Take 150 mg by mouth daily with breakfast.) 90 capsule 2   MYRBETRIQ 25 MG TB24 tablet Take 25 mg by mouth daily.     No current facility-administered medications on file prior to visit.     ROS see history of present illness  Objective  Physical Exam Vitals:   01/24/21 1112  BP: 118/78  Pulse: 89  Temp: 98.5 F (36.9 C)  SpO2: 96%    BP Readings from Last 3 Encounters:  01/24/21 118/78  12/02/20 (!) 150/87  10/07/20 (!) 144/84   Wt Readings from Last 3 Encounters:  01/24/21 156 lb 12.8 oz (71.1 kg)  12/01/20 155 lb (70.3 kg)  11/22/20 153 lb 6.4 oz (69.6 kg)    Physical  Exam Constitutional:      General: She is not in acute distress.    Appearance: She is not diaphoretic.  Cardiovascular:     Rate and Rhythm: Normal rate and regular rhythm.     Heart sounds: Normal heart sounds.  Pulmonary:     Effort: Pulmonary effort is normal.     Breath sounds: Normal breath sounds.  Skin:    General: Skin is warm and dry.  Neurological:     Mental Status: She is alert.     Assessment/Plan: Please see individual problem list.  Problem List Items Addressed This Visit     Depression    Chronic issue that is worsened.  She will continue Effexor 150 mg once daily.  We will add Wellbutrin 150 mg once daily.  Advised to contact us for worsening depression or if she develops anxiety or if she notices any side effects.       Relevant Medications   buPROPion (WELLBUTRIN XL) 150 MG 24 hr tablet   Essential hypertension    Adequate control.  She will continue HCTZ 25 mg once daily.       GERD (gastroesophageal reflux disease)    She is asymptomatic.  No red flags.  She will continue omeprazole.  Discussed that there is some risk of decreased absorption of certain vitamins and nutrients as well as increased risk of infection while taking this chronically.       Total knee replacement status    Reports she is doing well after knee replacement.        Return in about 2 months (around 03/26/2021) for Depression.  This visit occurred during the SARS-CoV-2 public health emergency.  Safety protocols were in place, including screening questions prior to the visit, additional usage of staff PPE, and extensive cleaning of exam room while observing appropriate contact time as indicated for disinfecting solutions.    [name], MD [facility]

## 2021-01-24 NOTE — Assessment & Plan Note (Signed)
She is asymptomatic.  No red flags.  She will continue omeprazole.  Discussed that there is some risk of decreased absorption of certain vitamins and nutrients as well as increased risk of infection while taking this chronically.

## 2021-01-24 NOTE — Assessment & Plan Note (Signed)
Adequate control.  She will continue HCTZ 25 mg once daily.

## 2021-01-24 NOTE — Assessment & Plan Note (Signed)
Chronic issue that is worsened.  She will continue Effexor 150 mg once daily.  We will add Wellbutrin 150 mg once daily.  Advised to contact us for worsening depression or if she develops anxiety or if she notices any side effects.

## 2021-01-27 ENCOUNTER — Telehealth: Payer: Self-pay | Admitting: Family Medicine

## 2021-01-27 NOTE — Telephone Encounter (Signed)
If she is having persistent symptoms, she needs to be seen.

## 2021-01-27 NOTE — Telephone Encounter (Signed)
LMTCB

## 2021-01-27 NOTE — Telephone Encounter (Signed)
Pt called returning your call 

## 2021-01-27 NOTE — Telephone Encounter (Signed)
Transfer to [place] at [facility] [employer] Nurse  PT called to advise that she is having discomfort and a burning feeling while urinating. Advise that she thinks she might have a infection and transfer her to [facility] Nurse. Advise no apts available today or tomorrow.

## 2021-01-27 NOTE — Telephone Encounter (Signed)
Called pt to follow up on urinary symptoms. Pt states its not burning but just a feeling of discomfort. Pt states she has not been anywhere to be seen. Pt asked if she could come to office and give a sample. Okay to schedule patient for labs if provider is okay placing orders? Pt last saw Dr.Sonnenberg on 6/20.

## 2021-01-28 NOTE — Telephone Encounter (Signed)
Providing access nurse documentation.      

## 2021-01-28 NOTE — Telephone Encounter (Signed)
Patient has an appointment for UTI at another office.  Ashley [name],cma

## 2021-01-30 NOTE — Discharge Instructions (Addendum)
Instructions after Hand / Wrist Surgery   James P. Hooten, Jr., M.D.  Dept. of Orthopaedics & Sports Medicine  [facility]  [address]  [place], [place]  [address]   Phone: [phone]   Fax: [phone]   DIET: Drink plenty of non-alcoholic fluids & begin a light diet. Resume your normal diet the day after surgery.  ACTIVITY:  Keep the hand elevated above the level of the elbow. Begin gently moving the fingers on a regular basis to avoid stiffness. Avoid any heavy lifting, pushing, or pulling with the operative hand. Do not drive or operate any equipment until instructed.  WOUND CARE:  Keep the splint/bandage clean and dry.  The splint and stitches will be removed in the office. Continue to use the ice packs periodically to reduce pain and swelling. You may bathe or shower after the stitches are removed at the first office visit following surgery.  MEDICATIONS: You may resume your regular medications. Please take the pain medication as prescribed. Do not take pain medication on an empty stomach. Do not drive or drink alcoholic beverages when taking pain medications.  CALL THE OFFICE FOR: Temperature above 101 degrees Excessive bleeding or drainage on the dressing. Excessive swelling, coldness, or paleness of the fingers. Persistent nausea and vomiting.  FOLLOW-UP:  You should have an appointment to return to the office in 7-10 days after surgery.   REMEMBER: R.I.C.E. = Rest, Ice, Compression, Elevation !      [facility] Department Directory         [email or web address]       [email or web address]          Cardiology  Appointments: [place] - [phone] [place] - [phone]  Endocrinology  Appointments: [place] - [phone] [place] - [phone]  Gastroenterology  Appointments: [place] - [phone] [place] - [phone]        General Surgery   Appointments: [place] -  [phone]  Internal Medicine/Family Medicine  Appointments: [place] - [phone] [place] - [phone] [place] - [phone]  Metabolic and Weigh Loss Surgery  Appointments: [place] - [phone]        Neurology  Appointments: [place] - [phone] [place] - [phone]  Neurosurgery  Appointments: [place] - [phone]  Obstetrics & Gynecology  Appointments: [place] - [phone] [place] - [phone]        Pediatrics  Appointments: [place] - [phone] [place] - [phone]  Physiatry  Appointments: [place] -[phone]  Physical Therapy  Appointments: [place] - [phone] [place] - [phone]        Podiatry  Appointments: [place] - [phone] [place] - [phone]  Pulmonology  Appointments: [place] - [phone]  Rheumatology  Appointments: [employer] - [phone]         Location: [facility]  [address] , [place]  [address]  [place] Location: [facility] [address] [place], [place]  [address]  [place] Location: [facility] [address] [place],   [address]            AMBULATORY SURGERY  DISCHARGE INSTRUCTIONS   The drugs that you were given will stay in your system until tomorrow so for the next 24 hours you should not:  Drive an automobile Make any legal decisions Drink any alcoholic beverage   You may resume regular meals tomorrow.  Today it is better to start with liquids and gradually work up to solid foods.  You may eat anything you prefer, but it is better to start with liquids, then soup and crackers, and gradually work up to solid foods.     Please notify your doctor immediately if you have any unusual bleeding, trouble breathing, redness and pain at the surgery site, drainage, fever, or pain not relieved by medication.    Additional Instructions:   Please contact your physician with any problems or Same Day Surgery at [phone],  Monday through Friday 6 am to 4 pm, or  at [facility] Main number at [phone]. 

## 2021-01-31 ENCOUNTER — Ambulatory Visit: Payer: Medicare HMO | Admitting: Urology

## 2021-02-01 ENCOUNTER — Encounter: Payer: Self-pay | Admitting: Urology

## 2021-02-09 ENCOUNTER — Other Ambulatory Visit: Payer: Medicare HMO

## 2021-02-15 ENCOUNTER — Encounter
Admission: RE | Admit: 2021-02-15 | Discharge: 2021-02-15 | Disposition: A | Discharge status: Home / Self Care | Payer: Medicare HMO | Source: Ambulatory Visit | Attending: Orthopedic Surgery | Admitting: Orthopedic Surgery

## 2021-02-15 ENCOUNTER — Other Ambulatory Visit: Payer: Self-pay

## 2021-02-15 NOTE — Patient Instructions (Addendum)
Your procedure is scheduled on: Monday, July 18 Report to the Registration Desk on the 1st floor of the [facility] To find out your arrival time, please call [phone] between 1PM - 3PM on: Friday, July 15  REMEMBER: Instructions that are not followed completely may result in serious medical risk, up to and including death; or upon the discretion of your surgeon and anesthesiologist your surgery may need to be rescheduled.  Do not eat food after midnight the night before surgery.  No gum chewing, lozengers or hard candies.  You may however, drink CLEAR liquids up to 2 hours before you are scheduled to arrive for your surgery. Do not drink anything within 2 hours of your scheduled arrival time.  Clear liquids include: - water  - apple juice without pulp - gatorade (not RED, PURPLE, OR BLUE) - black coffee or tea (Do NOT add milk or creamers to the coffee or tea) Do NOT drink anything that is not on this list.  In addition, your doctor has ordered for you to drink the provided  Ensure Pre-Surgery Clear Carbohydrate Drink  Drinking this carbohydrate drink up to two hours before surgery helps to reduce insulin resistance and improve patient outcomes. Please complete drinking 2 hours prior to scheduled arrival time.  TAKE THESE MEDICATIONS THE MORNING OF SURGERY WITH A SIP OF WATER:  Bupropion (Wellbutrin) Omeprazole (Prilosec) - (take one the night before and one on the morning of surgery - helps to prevent nausea after surgery.) Venlafaxine (Effexor)  One week prior to surgery: starting today, July 12 Stop Anti-inflammatories (NSAIDS) such as Advil, Aleve, Ibuprofen, Motrin, Naproxen, Naprosyn and Aspirin based products such as Excedrin, Goodys Powder, BC Powder. Stop ANY OVER THE COUNTER supplements until after surgery. You may however, continue to take Tylenol if needed for pain up until the day of surgery.  No Alcohol for 24 hours before or after surgery.  No Smoking  including e-cigarettes for 24 hours prior to surgery.  No chewable tobacco products for at least 6 hours prior to surgery.  No nicotine patches on the day of surgery.  Do not use any "recreational" drugs for at least a week prior to your surgery.  Please be advised that the combination of cocaine and anesthesia may have negative outcomes, up to and including death. If you test positive for cocaine, your surgery will be cancelled.  On the morning of surgery brush your teeth with toothpaste and water, you may rinse your mouth with mouthwash if you wish. Do not swallow any toothpaste or mouthwash.  Do not wear jewelry, make-up, hairpins, clips or nail polish.  Do not wear lotions, powders, or perfumes.   Do not shave body from the neck down 48 hours prior to surgery just in case you cut yourself which could leave a site for infection.  Also, freshly shaved skin may become irritated if using the CHG soap.  Do not bring valuables to the hospital. [facility] is not responsible for any missing/lost belongings or valuables.   Use CHG Soap as directed on instruction sheet.  Notify your doctor if there is any change in your medical condition (cold, fever, infection).  Wear comfortable clothing (specific to your surgery type) to the hospital.  After surgery, you can help prevent lung complications by doing breathing exercises.  Take deep breaths and cough every 1-2 hours. Your doctor may order a device called an Incentive Spirometer to help you take deep breaths.  If you are being discharged the  day of surgery, you will not be allowed to drive home. You will need a responsible adult (18 years or older) to drive you home and stay with you that night.   If you are taking public transportation, you will need to have a responsible adult (18 years or older) with you. Please confirm with your physician that it is acceptable to use public transportation.   Please call the Pre-admissions Testing  Dept. at [phone] if you have any questions about these instructions.  Surgery Visitation Policy:  Patients undergoing a surgery or procedure may have one family member or support person with them as long as that person is not COVID-19 positive or experiencing its symptoms.  That person may remain in the waiting area during the procedure.

## 2021-02-20 ENCOUNTER — Encounter: Payer: Self-pay | Admitting: Orthopedic Surgery

## 2021-02-20 NOTE — H&P (Signed)
ORTHOPAEDIC HISTORY & PHYSICAL [name], [name] - 02/04/2021 1:15 PM EDT Formatting of this note is different from the original. [facility] Chief Complaint:   Chief Complaint  Patient presents with   Wrist Pain  H & P LEFT WRIST   History of Present Illness:   Ashley [name] is a 80 y.o. female that presents to clinic today for her preoperative history and evaluation. Patient presents unaccompanied. The patient is scheduled to undergo a left carpal tunnel release on 02/21/21 by Dr. Ernest Pine. The patient reports a long history of left hand numbness and tingling. Patient denies any history of injury to the hand.   The patient's symptoms have progressed to the point that they decrease her quality of life. The patient has previously undergone conservative treatment including NSAIDS and activity modification without adequate control of her symptoms.  Past Medical, Surgical, Family, Social History, Allergies, Medications:   Past Medical History:  Past Medical History:  Diagnosis Date   AR (allergic rhinitis)   Chicken pox   Depression   Essential hypertension with goal blood pressure less than 140/90 12/28/2014  Ace and arb angioedema   Gastroesophageal reflux disease without esophagitis 11/12/2017   GERD (gastroesophageal reflux disease)   Hyperlipidemia   Hypertriglyceridemia   Psychiatric symptoms   Spinal stenosis of lumbar region with radiculopathy 11/12/2017   Past Surgical History:  Past Surgical History:  Procedure Laterality Date   COLONOSCOPY 10/18/2001, 10/12/2009   COLONOSCOPY 12/24/2019  Normal Colon/No Repeat due to age/TKT   DILATION AND CURETTAGE OF UTERUS  Fx D&C   Excision of synovial cyst from the right [facility] joint   Fractional D&C with Myosure 03/30/2017   LAMINECTOMY POSTERIOR CERVICLE DECOMP W/FACETECTOMY & FORAMINOTOMY Bilateral 11/19/2017  Procedure: LAMINEC/FACETECT/FORAMIN,EACH ADDNL; Surgeon: Judeth Porch, MD; Location: [facility] OR; Service: Neurosurgery; Laterality: Bilateral;   LAMINECTOMY POSTERIOR LUMBAR FACETECTOMY & FORAMINOTOMY W/DECOMP Bilateral 11/19/2017  Procedure: Lumbar three-five laminectomy.; Surgeon: Judeth Porch, MD; Location: [facility] OR; Service: Neurosurgery; Laterality: Bilateral;   Left total knee arthroplasty using computer-assisted navigation 12/01/2020  Dr Ernest Pine   Normal vaginal delivery 1966   Right carpal tunnel release 07/30/2019  Dr Rosita Kea   TONSILLECTOMY   Current Medications:  Current Outpatient Medications  Medication Sig Dispense Refill   acetaminophen (TYLENOL) 650 MG ER tablet Take 1,300 mg by mouth once daily as needed for Pain   amoxicillin (AMOXIL) 500 MG capsule Take 4 capsules 1 hour before the dental procedure 4 capsule 3   aspirin 81 MG EC tablet Take 1 tablet (81 mg total) by mouth once daily   azelastine (OPTIVAR) 0.05 % ophthalmic solution Place 1 drop into both eyes 2 (two) times daily   buPROPion (WELLBUTRIN XL) 150 MG XL tablet Take 150 mg by mouth once daily   celecoxib (CELEBREX) 200 MG capsule TAKE 1 CAPSULE BY MOUTH TWICE DAILY 60 capsule 3   cetirizine (ZYRTEC) 10 MG tablet Take 10 mg by mouth once daily   EPINEPHrine (EPIPEN) 0.3 mg/0.3 mL pen injector Inject 0.3 mLs into the muscle as needed   fluticasone propionate (FLONASE) 50 mcg/actuation nasal spray Place 1 spray into both nostrils once daily as needed   gabapentin (NEURONTIN) 100 MG capsule Take 100 mg by mouth once daily   hydroCHLOROthiazide (HYDRODIURIL) 25 MG tablet TAKE ONE TABLET EVERY DAY 90 tablet 3   melatonin 10 mg Tab Take 1 tablet by mouth nightly   mirabegron (MYRBETRIQ) 25 mg ER Tablet Take  25 mg by mouth once daily   omeprazole (PRILOSEC) 20 MG DR capsule Take 20 mg by mouth once daily   rosuvastatin (CRESTOR) 40 MG tablet 40 mg once daily   venlafaxine (EFFEXOR-XR) 150 MG XR capsule Take 150 mg by mouth once daily   peg 400-propylene glycol (SYSTANE  GEL) 0.4-0.3 % DrpG Apply 1 drop to eye once daily as needed (Patient not taking: Reported on 02/04/2021)   traMADoL (ULTRAM) 50 mg tablet Take 1 tablet (50 mg total) by mouth every 6 (six) hours as needed (Patient not taking: Reported on 02/04/2021)   No current facility-administered medications for this visit.   Allergies:  Allergies  Allergen Reactions   Ace Inhibitors Angioedema     Pravastatin Muscle Pain   Simvastatin Muscle Pain  stiffness   Social History:  Social History   Socioeconomic History   Marital status: Married  Spouse name: Ashley [name]   Number of children: 1   Years of education: 16   Highest education level: Bachelor's degree (e.g., BA, AB, BS)  Occupational History   Occupation: Full-time- Insurance  Tobacco Use   Smoking status: Former Smoker   Smokeless tobacco: Never Used  Building services engineer Use: Never used  Substance and Sexual Activity   Alcohol use: Yes  Alcohol/week: 2.0 standard drinks  Types: 2 Cans of beer per week  Comment: Ocassionally   Drug use: Never   Sexual activity: Yes  Birth control/protection: Post-menopausal   Family History:  Family History  Problem Relation Age of Onset   Rheum arthritis Mother   Aneurysm Father   COPD Sister   Myocardial Infarction (Heart attack) Sister   Cancer Sister  female cancer   Ovarian cancer Sister   Cancer Brother   Deep vein thrombosis (DVT or abnormal blood clot formation) Maternal Grandmother   Deep vein thrombosis (DVT or abnormal blood clot formation) Maternal Grandfather   Deep vein thrombosis (DVT or abnormal blood clot formation) Paternal Grandmother   Deep vein thrombosis (DVT or abnormal blood clot formation) Paternal Grandfather   Anesthesia problems Neg Hx   Malignant hypertension Neg Hx   Malignant hyperthermia Neg Hx   Review of Systems:   A 10+ ROS was performed, reviewed, and the pertinent orthopaedic findings are documented in the HPI.   Physical Examination:   BP 120/84  (BP Location: Left upper arm, Patient Position: Sitting, BP Cuff Size: Adult)  Ht 142.2 cm (4\' 8" )  Wt 71.3 kg (157 lb 3.2 oz)  LMP (LMP Unknown)  BMI 35.24 kg/m   Patient is a well-developed, well-nourished female in no acute distress. Patient has normal mood and affect. Patient is alert and oriented to person, place, and time.   HEENT: Atraumatic, normocephalic. Pupils equal and reactive to light. Extraocular motion intact. Noninjected sclera.  Cardiovascular: Regular rate and rhythm, with no murmurs, rubs, or gallops. Distal pulses palpable.  Respiratory: Lungs clear to auscultation bilaterally.   Left hand:  Tenderness: Negative Erythema: negative Swelling: negative Capillary Refill: normal Thenar atrophy: negative Intrinsic wasting: negative Grip strength: fair to good grip strength Pincer strength: fair to good pincer strength Tinel`s test: negative Compression test: positive Phalen`s test: positive Triggering: No gross triggering or locking of the digits Finkelstein`s test: negative Range of motion: Good range of motion of the digits  Sensation intact to pinprick over the median, radial, and ulnar nerve distributions.  Tests Performed/Reviewed:  X-rays  No new radiographs were obtained today.   Impression:   ICD-10-CM  1.  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 The risks of surgery, including infection and blood clots, were discussed with the patient. Having failed conservative treatment, the patient has elected to proceed with surgery. The risks of surgery, including blood clots and infection, were discussed with the patient. Measures taken to reduce these risks were also discussed with the patient. The postoperative course was discussed with the patient. Patient was instructed to stop all blood thinners prior to surgery. Patient understands and elects to proceed  with surgery.  Contact our office with any questions or concerns. Follow up as indicated, or sooner should any new problems arise, if conditions worsen, or if they are otherwise concerned.   [name], PA-C [facility] Orthopaedics and Sports Medicine [address] [place], [place] [address] Phone: [phone]  This note was generated in part with voice recognition software and I apologize for any typographical errors that were not detected and corrected.  Electronically signed by [name], PA at 02/04/2021 2:03 PM EDT

## 2021-02-21 ENCOUNTER — Encounter: Payer: Self-pay | Admitting: Orthopedic Surgery

## 2021-02-21 ENCOUNTER — Other Ambulatory Visit: Payer: Self-pay

## 2021-02-21 ENCOUNTER — Encounter
Admission: RE | Disposition: A | Discharge status: Home / Self Care | Payer: Self-pay | Source: Home / Self Care | Attending: Orthopedic Surgery

## 2021-02-21 ENCOUNTER — Ambulatory Visit: Payer: Medicare HMO | Admitting: Certified Registered Nurse Anesthetist

## 2021-02-21 ENCOUNTER — Ambulatory Visit
Admission: RE | Admit: 2021-02-21 | Discharge: 2021-02-21 | Disposition: A | Discharge status: Home / Self Care | Payer: Medicare HMO | Attending: Orthopedic Surgery | Admitting: Orthopedic Surgery

## 2021-02-21 DIAGNOSIS — Z79899 Other long term (current) drug therapy: Secondary | ICD-10-CM | POA: Diagnosis not present

## 2021-02-21 DIAGNOSIS — Z87891 Personal history of nicotine dependence: Secondary | ICD-10-CM | POA: Insufficient documentation

## 2021-02-21 DIAGNOSIS — Z7982 Long term (current) use of aspirin: Secondary | ICD-10-CM | POA: Insufficient documentation

## 2021-02-21 DIAGNOSIS — G5602 Carpal tunnel syndrome, left upper limb: Secondary | ICD-10-CM | POA: Insufficient documentation

## 2021-02-21 DIAGNOSIS — Z8261 Family history of arthritis: Secondary | ICD-10-CM | POA: Insufficient documentation

## 2021-02-21 DIAGNOSIS — Z888 Allergy status to other drugs, medicaments and biological substances status: Secondary | ICD-10-CM | POA: Diagnosis not present

## 2021-02-21 DIAGNOSIS — Z9889 Other specified postprocedural states: Secondary | ICD-10-CM

## 2021-02-21 DIAGNOSIS — G2581 Restless legs syndrome: Secondary | ICD-10-CM

## 2021-02-21 HISTORY — PX: CARPAL TUNNEL RELEASE: SHX101

## 2021-02-21 SURGERY — CARPAL TUNNEL RELEASE
Anesthesia: General | Site: Wrist | Laterality: Left

## 2021-02-21 MED ORDER — HYDROCODONE-ACETAMINOPHEN 5-325 MG PO TABS: 1.0000 | ORAL_TABLET | ORAL | 0 refills | Status: DC | PRN

## 2021-02-21 MED ORDER — LIDOCAINE HCL (CARDIAC) PF 100 MG/5ML IV SOSY
PREFILLED_SYRINGE | INTRAVENOUS | Status: DC | PRN
Start: 2021-02-21 — End: 2021-02-21
  Administered 2021-02-21: 60 mg via INTRAVENOUS

## 2021-02-21 MED ORDER — PROPOFOL 10 MG/ML IV BOLUS
INTRAVENOUS | Status: AC
  Filled 2021-02-21: qty 20

## 2021-02-21 MED ORDER — FENTANYL CITRATE (PF) 100 MCG/2ML IJ SOLN: 25.0000 ug | INTRAMUSCULAR | Status: DC | PRN

## 2021-02-21 MED ORDER — ACETAMINOPHEN 10 MG/ML IV SOLN
INTRAVENOUS | Status: AC
  Filled 2021-02-21: qty 100

## 2021-02-21 MED ORDER — ACETAMINOPHEN 10 MG/ML IV SOLN: 1000.0000 mg | Freq: Once | INTRAVENOUS | Status: DC | PRN

## 2021-02-21 MED ORDER — ORAL CARE MOUTH RINSE: 15.0000 mL | Freq: Once | OROMUCOSAL | Status: AC

## 2021-02-21 MED ORDER — GABAPENTIN 100 MG PO CAPS
100.0000 mg | ORAL_CAPSULE | Freq: Every day | ORAL | 1 refills | Status: DC
Start: 2021-02-21 — End: 2022-02-21

## 2021-02-21 MED ORDER — BUPIVACAINE HCL (PF) 0.25 % IJ SOLN
INTRAMUSCULAR | Status: DC | PRN
  Administered 2021-02-21: 10 mL

## 2021-02-21 MED ORDER — OXYCODONE HCL 5 MG/5ML PO SOLN
5.0000 mg | Freq: Once | ORAL | Status: DC | PRN
Start: 2021-02-21 — End: 2021-02-21

## 2021-02-21 MED ORDER — NEOMYCIN-POLYMYXIN B GU 40-200000 IR SOLN
Status: DC | PRN
  Administered 2021-02-21: 2 mL

## 2021-02-21 MED ORDER — CEFAZOLIN SODIUM-DEXTROSE 2-4 GM/100ML-% IV SOLN
2.0000 g | INTRAVENOUS | Status: AC
  Administered 2021-02-21: 2 g via INTRAVENOUS

## 2021-02-21 MED ORDER — OMEPRAZOLE 20 MG PO CPDR: 20.0000 mg | DELAYED_RELEASE_CAPSULE | Freq: Every day | ORAL | 5 refills | Status: DC

## 2021-02-21 MED ORDER — DEXAMETHASONE SODIUM PHOSPHATE 10 MG/ML IJ SOLN
INTRAMUSCULAR | Status: DC | PRN
  Administered 2021-02-21: 10 mg via INTRAVENOUS

## 2021-02-21 MED ORDER — METOCLOPRAMIDE HCL 5 MG/ML IJ SOLN
5.0000 mg | Freq: Three times a day (TID) | INTRAMUSCULAR | Status: DC | PRN
Start: 2021-02-21 — End: 2021-02-21

## 2021-02-21 MED ORDER — METOCLOPRAMIDE HCL 10 MG PO TABS: 5.0000 mg | ORAL_TABLET | Freq: Three times a day (TID) | ORAL | Status: DC | PRN

## 2021-02-21 MED ORDER — CHLORHEXIDINE GLUCONATE 0.12 % MT SOLN: 15.0000 mL | Freq: Once | OROMUCOSAL | Status: AC

## 2021-02-21 MED ORDER — OXYCODONE HCL 5 MG PO TABS: 5.0000 mg | ORAL_TABLET | Freq: Once | ORAL | Status: DC | PRN

## 2021-02-21 MED ORDER — FENTANYL CITRATE (PF) 100 MCG/2ML IJ SOLN
INTRAMUSCULAR | Status: AC
  Filled 2021-02-21: qty 2

## 2021-02-21 MED ORDER — ONDANSETRON HCL 4 MG/2ML IJ SOLN
INTRAMUSCULAR | Status: AC
  Filled 2021-02-21: qty 2

## 2021-02-21 MED ORDER — ACETAMINOPHEN 10 MG/ML IV SOLN
INTRAVENOUS | Status: DC | PRN
  Administered 2021-02-21: 1000 mg via INTRAVENOUS

## 2021-02-21 MED ORDER — DEXAMETHASONE SODIUM PHOSPHATE 10 MG/ML IJ SOLN
INTRAMUSCULAR | Status: AC
  Filled 2021-02-21: qty 1

## 2021-02-21 MED ORDER — ROSUVASTATIN CALCIUM 40 MG PO TABS: 40.0000 mg | ORAL_TABLET | Freq: Every day | ORAL | Status: DC

## 2021-02-21 MED ORDER — LACTATED RINGERS IV SOLN: INTRAVENOUS | Status: DC

## 2021-02-21 MED ORDER — PROPOFOL 10 MG/ML IV BOLUS
INTRAVENOUS | Status: DC | PRN
  Administered 2021-02-21: 150 mg via INTRAVENOUS
  Administered 2021-02-21: 30 mg via INTRAVENOUS

## 2021-02-21 MED ORDER — LIDOCAINE HCL (PF) 2 % IJ SOLN
INTRAMUSCULAR | Status: AC
  Filled 2021-02-21: qty 10

## 2021-02-21 MED ORDER — CEFAZOLIN SODIUM-DEXTROSE 2-4 GM/100ML-% IV SOLN
INTRAVENOUS | Status: AC
  Filled 2021-02-21: qty 100

## 2021-02-21 MED ORDER — VENLAFAXINE HCL ER 150 MG PO CP24: 150.0000 mg | ORAL_CAPSULE | Freq: Every day | ORAL | Status: DC

## 2021-02-21 MED ORDER — ONDANSETRON HCL 4 MG PO TABS: 4.0000 mg | ORAL_TABLET | Freq: Four times a day (QID) | ORAL | Status: DC | PRN

## 2021-02-21 MED ORDER — ONDANSETRON HCL 4 MG/2ML IJ SOLN
4.0000 mg | Freq: Four times a day (QID) | INTRAMUSCULAR | Status: DC | PRN
  Administered 2021-02-21: 4 mg via INTRAVENOUS

## 2021-02-21 MED ORDER — CELECOXIB 200 MG PO CAPS: 400.0000 mg | ORAL_CAPSULE | Freq: Once | ORAL | Status: AC

## 2021-02-21 MED ORDER — FENTANYL CITRATE (PF) 100 MCG/2ML IJ SOLN
INTRAMUSCULAR | Status: DC | PRN
  Administered 2021-02-21: 25 ug via INTRAVENOUS

## 2021-02-21 MED ORDER — CELECOXIB 200 MG PO CAPS
ORAL_CAPSULE | ORAL | Status: AC
  Administered 2021-02-21: 400 mg via ORAL
  Filled 2021-02-21: qty 2

## 2021-02-21 MED ORDER — CHLORHEXIDINE GLUCONATE 0.12 % MT SOLN
OROMUCOSAL | Status: AC
  Administered 2021-02-21: 15 mL via OROMUCOSAL
  Filled 2021-02-21: qty 15

## 2021-02-21 MED ORDER — BUPIVACAINE HCL (PF) 0.25 % IJ SOLN
INTRAMUSCULAR | Status: AC
  Filled 2021-02-21: qty 30

## 2021-02-21 MED ORDER — FLUTICASONE PROPIONATE 50 MCG/ACT NA SUSP: 1.0000 | Freq: Every day | NASAL | Status: DC

## 2021-02-21 MED ORDER — 0.9 % SODIUM CHLORIDE (POUR BTL) OPTIME
TOPICAL | Status: DC | PRN
  Administered 2021-02-21: 500 mL

## 2021-02-21 MED ORDER — ONDANSETRON HCL 4 MG/2ML IJ SOLN: 4.0000 mg | Freq: Once | INTRAMUSCULAR | Status: DC | PRN

## 2021-02-21 SURGICAL SUPPLY — 29 items
BNDG CMPR STD VLCR NS LF 5.8X3 (GAUZE/BANDAGES/DRESSINGS) ×1
BNDG ELASTIC 3X5.8 VLCR NS LF (GAUZE/BANDAGES/DRESSINGS) ×2 IMPLANT
BNDG ESMARK 4X12 TAN STRL LF (GAUZE/BANDAGES/DRESSINGS) ×2 IMPLANT
CANISTER SUCT 1200ML W/VALVE (MISCELLANEOUS) ×2 IMPLANT
CAST PADDING 3X4FT ST 30246 (SOFTGOODS) ×1
CUFF TOURN SGL QUICK 18X4 (TOURNIQUET CUFF) ×2 IMPLANT
DRSG DERMACEA 8X12 NADH (GAUZE/BANDAGES/DRESSINGS) ×2 IMPLANT
DURAPREP 26ML APPLICATOR (WOUND CARE) ×2 IMPLANT
ELECT CAUTERY BLADE 6.4 (BLADE) ×2 IMPLANT
ELECT REM PT RETURN 9FT ADLT (ELECTROSURGICAL) ×2
ELECTRODE REM PT RTRN 9FT ADLT (ELECTROSURGICAL) ×1 IMPLANT
GAUZE 4X4 16PLY ~~LOC~~+RFID DBL (SPONGE) ×2 IMPLANT
GAUZE SPONGE 4X4 12PLY STRL (GAUZE/BANDAGES/DRESSINGS) ×2 IMPLANT
GLOVE SURG ENC TEXT LTX SZ7.5 (GLOVE) ×2 IMPLANT
GLOVE SURG UNDER LTX SZ8 (GLOVE) ×2 IMPLANT
GOWN STRL REUS W/ TWL LRG LVL3 (GOWN DISPOSABLE) ×2 IMPLANT
GOWN STRL REUS W/TWL LRG LVL3 (GOWN DISPOSABLE) ×4
KIT TURNOVER KIT A (KITS) ×2 IMPLANT
MANIFOLD NEPTUNE II (INSTRUMENTS) ×2 IMPLANT
NS IRRIG 500ML POUR BTL (IV SOLUTION) ×2 IMPLANT
PACK EXTREMITY ARMC (MISCELLANEOUS) ×2 IMPLANT
PAD CAST CTTN 3X4 STRL (SOFTGOODS) ×1 IMPLANT
PADDING CAST COTTON 3X4 STRL (SOFTGOODS) ×1
SOL PREP PVP 2OZ (MISCELLANEOUS) ×2
SOLUTION PREP PVP 2OZ (MISCELLANEOUS) ×1 IMPLANT
SPLINT CAST 1 STEP 3X12 (MISCELLANEOUS) ×2 IMPLANT
STOCKINETTE 48X4 2 PLY STRL (GAUZE/BANDAGES/DRESSINGS) ×1 IMPLANT
STOCKINETTE STRL 4IN 9604848 (GAUZE/BANDAGES/DRESSINGS) ×2 IMPLANT
SUT ETHILON 5-0 FS-2 18 BLK (SUTURE) ×2 IMPLANT

## 2021-02-21 NOTE — Anesthesia Preprocedure Evaluation (Signed)
Anesthesia Evaluation  Patient identified by MRN, date of birth, ID band Patient awake    Reviewed: Allergy & Precautions, H&P , NPO status , Patient's Chart, lab work & pertinent test results  Airway Mallampati: II  TM Distance: >3 FB Neck ROM: full    Dental  (+) Chipped, Teeth Intact   Pulmonary neg COPD, former smoker,    Pulmonary exam normal breath sounds clear to auscultation       Cardiovascular hypertension, (-) angina(-) Past MI Normal cardiovascular exam Rhythm:Regular Rate:Normal     Neuro/Psych PSYCHIATRIC DISORDERS Anxiety Depression CVA, No Residual Symptoms    GI/Hepatic Neg liver ROS, GERD  Medicated and Controlled,  Endo/Other  negative endocrine ROS  Renal/GU negative Renal ROS  negative genitourinary   Musculoskeletal  (+) Arthritis ,   Abdominal   Peds  Hematology negative hematology ROS (+)   Anesthesia Other Findings Past Medical History: No date: Abnormal uterine bleeding (AUB)     Comment:  a. benign endometrial polyps on D+C in 03/2017 No date: Anxiety No date: Arthritis No date: Chicken pox No date: Depression No date: GERD (gastroesophageal reflux disease) No date: History of stress test     Comment:  a. 08/2017 Lexiscan MV: EF 55-65%. No ischemia/infarct.               Low risk. 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 Dx in setting of dizziness/vertigo; b. 02/2018 Carotid              U/S: bilateral intimal thickening and atherosclerotic               plaque. No hemodynamically significant stenoses; c.               03/2018 MRI Brain: No mass or abnl enhancement. Chronic               microvascular ischemic changes and mod volume loss of the              brain. 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 No date: Stroke (HCC)     Comment:  PT STATES SHE WAS TOLD THIS BY HER PCP BUT PT STATES SHE               NEVER KNEW SHE HAD A STROKE-NO RESIDUAL EFFECTS No date: Vertigo  Past Surgical History: No date: BACK SURGERY 07/30/2019: CARPAL TUNNEL RELEASE; Right 05/15/2019: CYSTOSCOPY MACROPLASTIQUE IMPLANT; N/A     Comment:  Procedure: CYSTOSCOPY MACROPLASTIQUE IMPLANT;  Surgeon:               Orson Ape, MD;  Location: [facility] ORS;  Service:               Urology;  Laterality: N/A; 03/30/2017: DILATATION & CURETTAGE/HYSTEROSCOPY WITH MYOSURE; N/A     Comment:  Procedure: DILATATION & CURETTAGE/HYSTEROSCOPY WITH               MYOSURE;  Surgeon: Schermerhorn, Ihor Austin, MD;  Location:              [facility] ORS;  Service: Gynecology;  Laterality: N/A; No date: EYE SURGERY; Bilateral     Comment:  Cataract Extraction with IOL No date: SKIN LESION EXCISION No date: TONSILLECTOMY AND ADENOIDECTOMY     Comment:  Age 80  BMI  Body Mass Index: 31.35 kg/m      Reproductive/Obstetrics negative OB ROS                             Anesthesia Physical  Anesthesia Plan  ASA: 2  Anesthesia Plan: General   Post-op Pain Management:    Induction: Intravenous  PONV Risk Score and Plan: 3 and Ondansetron, Dexamethasone and Treatment may vary due to age or medical condition  Airway Management Planned: LMA  Additional Equipment: None  Intra-op Plan:   Post-operative Plan: Extubation in OR  Informed Consent: I have reviewed the patients History and Physical, chart, labs and discussed the procedure including the risks, benefits and alternatives for the proposed anesthesia with the patient or authorized representative who has indicated his/her understanding and acceptance.     Dental Advisory Given  Plan Discussed with: Anesthesiologist, CRNA and Surgeon  Anesthesia Plan Comments: (Discussed risks of anesthesia with patient, including PONV, sore throat, lip/dental damage. Rare risks discussed as well, such as cardiorespiratory and neurological sequelae.  Patient understands.)        Anesthesia Quick Evaluation

## 2021-02-21 NOTE — Transfer of Care (Signed)
Immediate Anesthesia Transfer of Care Note  Patient: Ashley [name]  Procedure(s) Performed: CARPAL TUNNEL RELEASE (Left: Wrist)  Patient Location: PACU  Anesthesia Type:General  Level of Consciousness: drowsy  Airway & Oxygen Therapy: Patient Spontanous Breathing and Patient connected to face mask oxygen  Post-op Assessment: Report given to RN and Post -op Vital signs reviewed and stable  Post vital signs: Reviewed and stable  Last Vitals:  Vitals Value Taken Time  BP 150/81 02/21/21 1620  Temp    Pulse 93 02/21/21 1624  Resp 14 02/21/21 1624  SpO2 100 % 02/21/21 1624  Vitals shown include unvalidated device data.  Last Pain:  Vitals:   02/21/21 1452  TempSrc: Temporal  PainSc: 0-No pain         Complications: No notable events documented.

## 2021-02-21 NOTE — Op Note (Signed)
OPERATIVE NOTE  DATE OF SURGERY:  02/21/2021  PATIENT NAME:  Ashley [name]   DOB: 1941-04-24  MRN: [id number]  PRE-OPERATIVE DIAGNOSIS: Left carpal tunnel syndrome  POST-OPERATIVE DIAGNOSIS:  Same  PROCEDURE:  Left carpal tunnel release  SURGEON:  Jena Gauss. M.D.  ANESTHESIA: general  ESTIMATED BLOOD LOSS: Minimal  FLUIDS REPLACED: 400 mL of crystalloid  TOURNIQUET TIME: 35 minutes  DRAINS: None  INDICATIONS FOR SURGERY: Ashley [name] is a 80 y.o. year old female with a long history of numbness and paresthesias to the left hand. EMG/nerve conduction studies demonstrated findings consistent with carpal tunnel syndrome.The patient had not seen any significant improvement despite conservative nonsurgical intervention. After discussion of the risks and benefits of surgical intervention, the patient expressed understanding of the risks benefits and agree with plans for carpal tunnel release.   PROCEDURE IN DETAIL: The patient was brought into the operating room and after adequate general anesthesia, a tourniquet was placed on the patient's left upper arm.The left hand and arm were prepped with alcohol and Duraprep and draped in the usual sterile fashion. A "time-out" was performed as per usual protocol. The hand and forearm were exsanguinated using an Esmarch and the tourniquet was inflated to 250 mmHg. Loupe magnification was used throughout the procedure. An incision was made just ulnar to the thenar palmar crease. Dissection was carried down through the palmar fascia to the transverse carpal ligament. The transverse carpal ligament was sharply incised, taking care to protect the underlying structures with the carpal tunnel. Complete release of the transverse carpal ligament was achieved. There was no evidence of ganglion cyst or lipoma within the carpal tunnel. The wound was irrigated with copious amounts of normal saline with antibiotic solution. The skin was then  re-approximated with interrupted sutures of #5-0 nylon. A sterile dressing was applied followed by application of a volar splint. The tourniquet was deflated with a total tourniquet time of 35 minutes.  The patient tolerated the procedure well and was transported to the PACU in stable condition.  Donney Caraveo P. Angie Fava., M.D.

## 2021-02-21 NOTE — H&P (Signed)
The patient has been re-examined, and the chart reviewed, and there have been no interval changes to the documented history and physical.    The risks, benefits, and alternatives have been discussed at length. The patient expressed understanding of the risks benefits and agreed with plans for surgical intervention.  Jabarie Pop P. Smera Guyette, Jr. M.D.    

## 2021-02-21 NOTE — Anesthesia Procedure Notes (Signed)
Procedure Name: LMA Insertion Date/Time: 02/21/2021 3:17 PM Performed by: Hezzie Bump, CRNA Pre-anesthesia Checklist: Patient identified, Patient being monitored, Timeout performed, Emergency Drugs available and Suction available Patient Re-evaluated:Patient Re-evaluated prior to induction Oxygen Delivery Method: Circle system utilized Preoxygenation: Pre-oxygenation with 100% oxygen Induction Type: IV induction Ventilation: Mask ventilation without difficulty LMA: LMA inserted LMA Size: 3.5 Tube type: Oral Number of attempts: 1 Placement Confirmation: positive ETCO2 and breath sounds checked- equal and bilateral Tube secured with: Tape Dental Injury: Teeth and Oropharynx as per pre-operative assessment

## 2021-02-21 NOTE — Anesthesia Postprocedure Evaluation (Signed)
Anesthesia Post Note  Patient: Ashley [name]  Procedure(s) Performed: CARPAL TUNNEL RELEASE (Left: Wrist)  Patient location during evaluation: PACU Anesthesia Type: General Level of consciousness: awake and alert Pain management: pain level controlled Vital Signs Assessment: post-procedure vital signs reviewed and stable Respiratory status: spontaneous breathing, nonlabored ventilation, respiratory function stable and patient connected to nasal cannula oxygen Cardiovascular status: blood pressure returned to baseline and stable Postop Assessment: no apparent nausea or vomiting Anesthetic complications: no   No notable events documented.   Last Vitals:  Vitals:   02/21/21 1700 02/21/21 1710  BP: (!) 147/75 (!) 150/77  Pulse: (!) 101 96  Resp: 15 16  Temp: 36.5 C 36.7 C  SpO2: 97% 96%    Last Pain:  Vitals:   02/21/21 1710  TempSrc: Temporal  PainSc: 0-No pain                 Cleda Mccreedy Anaysha Andre

## 2021-02-22 ENCOUNTER — Encounter: Payer: Self-pay | Admitting: Orthopedic Surgery

## 2021-03-21 ENCOUNTER — Other Ambulatory Visit: Payer: Self-pay | Admitting: Family Medicine

## 2021-03-28 ENCOUNTER — Other Ambulatory Visit: Payer: Self-pay

## 2021-03-28 ENCOUNTER — Ambulatory Visit (INDEPENDENT_AMBULATORY_CARE_PROVIDER_SITE_OTHER): Payer: Medicare HMO | Admitting: Family Medicine

## 2021-03-28 ENCOUNTER — Ambulatory Visit (INDEPENDENT_AMBULATORY_CARE_PROVIDER_SITE_OTHER): Payer: Medicare HMO

## 2021-03-28 VITALS — BP 130/70 | HR 96 | Temp 98.6°F | Ht 59.0 in | Wt 157.8 lb

## 2021-03-28 DIAGNOSIS — R202 Paresthesia of skin: Secondary | ICD-10-CM

## 2021-03-28 DIAGNOSIS — R0683 Snoring: Secondary | ICD-10-CM

## 2021-03-28 DIAGNOSIS — E785 Hyperlipidemia, unspecified: Secondary | ICD-10-CM

## 2021-03-28 DIAGNOSIS — I10 Essential (primary) hypertension: Secondary | ICD-10-CM | POA: Diagnosis not present

## 2021-03-28 DIAGNOSIS — Z8249 Family history of ischemic heart disease and other diseases of the circulatory system: Secondary | ICD-10-CM

## 2021-03-28 DIAGNOSIS — F32A Depression, unspecified: Secondary | ICD-10-CM | POA: Diagnosis not present

## 2021-03-28 DIAGNOSIS — R7303 Prediabetes: Secondary | ICD-10-CM | POA: Diagnosis not present

## 2021-03-28 LAB — LIPID PANEL
Cholesterol: 142 mg/dL (ref 0–200)
HDL: 64.6 mg/dL (ref >39.00)
LDL Cholesterol: 47 mg/dL (ref 0–99)
NonHDL: 77.28
Total CHOL/HDL Ratio: 2
Triglycerides: 151 mg/dL — ABNORMAL HIGH (ref 0.0–149.0)
VLDL: 30.2 mg/dL (ref 0.0–40.0)

## 2021-03-28 LAB — COMPREHENSIVE METABOLIC PANEL
ALT: 18 U/L (ref 0–35)
AST: 21 U/L (ref 0–37)
Albumin: 4.4 g/dL (ref 3.5–5.2)
Alkaline Phosphatase: 51 U/L (ref 39–117)
BUN: 15 mg/dL (ref 6–23)
CO2: 29 mEq/L (ref 19–32)
Calcium: 9.8 mg/dL (ref 8.4–10.5)
Chloride: 98 mEq/L (ref 96–112)
Creatinine, Ser: 0.94 mg/dL (ref 0.40–1.20)
GFR: 58.36 mL/min — ABNORMAL LOW (ref >60.00)
Glucose, Bld: 95 mg/dL (ref 70–99)
Potassium: 3.7 mEq/L (ref 3.5–5.1)
Sodium: 138 mEq/L (ref 135–145)
Total Bilirubin: 0.4 mg/dL (ref 0.2–1.2)
Total Protein: 6.8 g/dL (ref 6.0–8.3)

## 2021-03-28 LAB — HEMOGLOBIN A1C: Hgb A1c MFr Bld: 6.3 % (ref 4.6–6.5)

## 2021-03-28 NOTE — Assessment & Plan Note (Signed)
Much improved.  She will continue Effexor 150 mg once daily and Wellbutrin 150 mg once daily.

## 2021-03-28 NOTE — Assessment & Plan Note (Signed)
Check A1c. 

## 2021-03-28 NOTE — Assessment & Plan Note (Signed)
Patient has already satisfied screening requirements for this.  Discussed there is no need for additional screening at this time given her age and sex.

## 2021-03-28 NOTE — Assessment & Plan Note (Signed)
Suspect nerve impingement is contributing to this.  Neurologically intact.  Given her recent fall we will get an x-ray of her neck.  Consider further management and evaluation pending x-ray.

## 2021-03-28 NOTE — Assessment & Plan Note (Signed)
I encouraged her to use her dental device.  She does not meet criteria for sleep study.  Discussed that we would have a hard time justifying sleep study to her insurance.  The patient was agreeable with this.

## 2021-03-28 NOTE — Assessment & Plan Note (Signed)
Due for lipid panel.  This to be checked today.  Continue Crestor 40 mg daily.

## 2021-03-28 NOTE — Patient Instructions (Signed)
Nice to see you. We will get an x-ray and lab work today. 

## 2021-03-28 NOTE — Assessment & Plan Note (Signed)
Adequate control.  She will continue with hydrochlorothiazide 25 mg once daily.

## 2021-03-28 NOTE — Progress Notes (Signed)
Tommi Rumps, MD Phone: [phone]  Ashley [name] is a 80 y.o. female who presents today for f/u.  Snoring: The patient notes she snores.  She has a dental device from her dentist that helps with this.  She went with her husband to his sleep study and they advised she have a sleep study because she was snoring.  She notes no hypersomnia.  She does wake up well rested.  She has noted no apneic episodes.  Her Epworth Sleepiness Scale score is 2 with individual scores of 0, 0, 0, 0, 2, 0, 0, 0.  Shoulder tingling: Patient notes tingling in her left posterior lateral neck and shoulder. Notes it radiates from her neck down to her shoulder. She notes no neck pain.  No weakness.  No numbness.  No vision changes.  She notes she fell a couple of weeks ago when she slipped on gravel into a ditch.  She slightly bumped her head though has had no headaches or other symptoms following the fall. Has been going on one week. No associated rash.   Hypertension: Taking HCTZ.  No chest pain or shortness of breath.  No edema.  Depression: She notes this is better.  She is on Effexor and Wellbutrin.  No SI.  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  Family history of AAA: Her father had a AAA.  Patient reports having screening about 10 years ago with an ultrasound.  She reports no aneurysm was found.  She did have a CT scan 5 years ago that did not report an aneurysm either.  Social History   Tobacco Use  Smoking Status Former   Packs/day: 0.50   Years: 15.00   Pack years: 7.50   Types: Cigarettes   Quit date: 08/07/1976   Years since quitting: 44.6  Smokeless Tobacco Never    Current Outpatient Medications on File Prior to Visit  Medication Sig Dispense Refill   acetaminophen (TYLENOL) 650 MG CR tablet Take 1,300 mg by mouth every 8 (eight) hours as needed for pain.     aspirin EC 81 MG tablet Take 81 mg by mouth daily. Swallow whole.     buPROPion  (WELLBUTRIN XL) 150 MG 24 hr tablet Take 1 tablet (150 mg total) by mouth daily. 90 tablet 1   celecoxib (CELEBREX) 200 MG capsule Take 200 mg by mouth 2 (two) times daily.     cetirizine (ZYRTEC) 10 MG tablet Take 10 mg by mouth daily.     EPINEPHrine (EPIPEN 2-PAK) 0.3 mg/0.3 mL IJ SOAJ injection Inject 0.3 mLs (0.3 mg total) into the muscle as needed for anaphylaxis. 1 each 1   fluticasone (FLONASE) 50 MCG/ACT nasal spray Place 1 spray into both nostrils daily.     gabapentin (NEURONTIN) 100 MG capsule Take 1 capsule (100 mg total) by mouth at bedtime. 90 capsule 1   hydrochlorothiazide (HYDRODIURIL) 25 MG tablet TAKE 1 TABLET BY MOUTH DAILY (Patient taking differently: Take 25 mg by mouth daily.) 90 tablet 1   HYDROcodone-acetaminophen (NORCO) 5-325 MG tablet Take 1 tablet by mouth every 4 (four) hours as needed for moderate pain. 8 tablet 0   HYDROcodone-acetaminophen (NORCO/VICODIN) 5-325 MG tablet Take 1 tablet by mouth 2 (two) times daily as needed for severe pain.     Melatonin 10 MG TABS Take 10 mg by mouth at bedtime.     omeprazole (PRILOSEC) 20 MG capsule Take 1 capsule (20 mg total) by mouth daily.  30 capsule 5   OVER THE COUNTER MEDICATION Take 2 tablets by mouth daily. Glucocil     Polyethyl Glycol-Propyl Glycol (SYSTANE) 0.4-0.3 % SOLN Place 1 drop into both eyes 3 (three) times daily as needed (dry/irritated eyes.).     rosuvastatin (CRESTOR) 40 MG tablet Take 1 tablet (40 mg total) by mouth at bedtime.     venlafaxine XR (EFFEXOR-XR) 150 MG 24 hr capsule TAKE 1 CAPSULE BY MOUTH EVERY DAY 90 capsule 3   No current facility-administered medications on file prior to visit.     ROS see history of present illness  Objective  Physical Exam Vitals:   03/28/21 1057  BP: 130/70  Pulse: 96  Temp: 98.6 F (37 C)  SpO2: 96%    BP Readings from Last 3 Encounters:  03/28/21 130/70  02/21/21 (!) 150/77  01/24/21 118/78   Wt Readings from Last 3 Encounters:  03/28/21 157 lb  12.8 oz (71.6 kg)  02/21/21 152 lb (68.9 kg)  02/15/21 152 lb (68.9 kg)    Physical Exam Constitutional:      General: She is not in acute distress.    Appearance: She is not diaphoretic.  Cardiovascular:     Rate and Rhythm: Normal rate and regular rhythm.     Heart sounds: Normal heart sounds.  Pulmonary:     Effort: Pulmonary effort is normal.     Breath sounds: Normal breath sounds.  Musculoskeletal:     Comments: No midline back tenderness, no midline neck step-off  Skin:    General: Skin is warm and dry.  Neurological:     Mental Status: She is alert.     Comments: EOMI, PERRL, opens and closes eyes adequately, V1 through V3 intact to light touch bilaterally, hearing intact to finger rub, shoulder shrug intact, 5/5 strength in bilateral biceps, triceps, grip, quads, hamstrings, plantar and dorsiflexion, sensation to light touch intact in bilateral UE and LE, normal gait     Assessment/Plan: Please see individual problem list.  Problem List Items Addressed This Visit     Depression    Much improved.  She will continue Effexor 150 mg once daily and Wellbutrin 150 mg once daily.      Essential hypertension - Primary    Adequate control.  She will continue with hydrochlorothiazide 25 mg once daily.      Relevant Orders   Comp Met (CMET)   Lipid panel   Family history of abdominal aortic aneurysm (AAA)    Patient has already satisfied screening requirements for this.  Discussed there is no need for additional screening at this time given her age and sex.      Hyperlipidemia    Due for lipid panel.  This to be checked today.  Continue Crestor 40 mg daily.      Prediabetes    Check A1c.      Relevant Orders   HgB A1c   Snoring    I encouraged her to use her dental device.  She does not meet criteria for sleep study.  Discussed that we would have a hard time justifying sleep study to her insurance.  The patient was agreeable with this.      Tingling of left  shoulder    Suspect nerve impingement is contributing to this.  Neurologically intact.  Given her recent fall we will get an x-ray of her neck.  Consider further management and evaluation pending x-ray.      Relevant Orders   DG Cervical  Spine Complete    Return in about 4 months (around 07/28/2021).  This visit occurred during the SARS-CoV-2 public health emergency.  Safety protocols were in place, including screening questions prior to the visit, additional usage of staff PPE, and extensive cleaning of exam room while observing appropriate contact time as indicated for disinfecting solutions.    Tommi Rumps, MD [place]

## 2021-03-29 ENCOUNTER — Telehealth: Payer: Self-pay | Admitting: Family Medicine

## 2021-03-29 NOTE — Telephone Encounter (Signed)
Patient is returning your call to go over her lab results. 

## 2021-03-30 NOTE — Telephone Encounter (Signed)
Patient is calling to get more information about her lab results.She is unsure about her lab results that stated her kidneys aren't functioning properly.

## 2021-03-31 ENCOUNTER — Other Ambulatory Visit: Payer: Self-pay | Admitting: Family Medicine

## 2021-03-31 DIAGNOSIS — N179 Acute kidney failure, unspecified: Secondary | ICD-10-CM

## 2021-03-31 MED ORDER — PREDNISONE 20 MG PO TABS: 40.0000 mg | ORAL_TABLET | Freq: Every day | ORAL | 0 refills | Status: DC

## 2021-03-31 NOTE — Telephone Encounter (Signed)
I called and spoke with the patient and explained the range of her kidney function in layman's terms and she understood.  Ashley [name],cma

## 2021-04-08 ENCOUNTER — Telehealth: Payer: Self-pay | Admitting: Family Medicine

## 2021-04-08 NOTE — Telephone Encounter (Signed)
Allean Found, CMA  04/08/2021 11:04 AM EDT Back to Top    I called and LVM for the patient to review mychart for comments and questions on her labs.  Nina,cma   Patient calling back in. She is scheduled for labs end of September 2022.

## 2021-04-20 ENCOUNTER — Other Ambulatory Visit: Payer: Self-pay | Admitting: Family Medicine

## 2021-04-20 DIAGNOSIS — F32A Depression, unspecified: Secondary | ICD-10-CM

## 2021-05-03 ENCOUNTER — Other Ambulatory Visit: Payer: Self-pay

## 2021-05-03 ENCOUNTER — Other Ambulatory Visit (INDEPENDENT_AMBULATORY_CARE_PROVIDER_SITE_OTHER): Payer: Medicare HMO

## 2021-05-03 DIAGNOSIS — N179 Acute kidney failure, unspecified: Secondary | ICD-10-CM

## 2021-05-03 LAB — BASIC METABOLIC PANEL
BUN: 15 mg/dL (ref 6–23)
CO2: 27 mEq/L (ref 19–32)
Calcium: 9.9 mg/dL (ref 8.4–10.5)
Chloride: 96 mEq/L (ref 96–112)
Creatinine, Ser: 0.97 mg/dL (ref 0.40–1.20)
GFR: 56.16 mL/min — ABNORMAL LOW (ref >60.00)
Glucose, Bld: 88 mg/dL (ref 70–99)
Potassium: 3.8 mEq/L (ref 3.5–5.1)
Sodium: 135 mEq/L (ref 135–145)

## 2021-05-04 ENCOUNTER — Other Ambulatory Visit: Payer: Self-pay | Admitting: Internal Medicine

## 2021-05-05 ENCOUNTER — Ambulatory Visit: Payer: Medicare HMO

## 2021-05-07 ENCOUNTER — Other Ambulatory Visit: Payer: Self-pay | Admitting: Family Medicine

## 2021-05-07 DIAGNOSIS — N179 Acute kidney failure, unspecified: Secondary | ICD-10-CM

## 2021-05-09 ENCOUNTER — Telehealth: Payer: Self-pay | Admitting: *Deleted

## 2021-05-09 NOTE — Telephone Encounter (Signed)
Informed the patient to stop taking he Celebrex . She took it this morning ,but will discontinue her second does today. She has been scheduled for a BMET on 10.18.22. She knows not to take anymore Celebrex before her labs.

## 2021-05-09 NOTE — Telephone Encounter (Signed)
Left message to call office patient needs to stop Celebrex and schedule Lab for in 2 weeks from stopping Celebrex.

## 2021-05-09 NOTE — Telephone Encounter (Signed)
Patient returned your call.

## 2021-05-09 NOTE — Telephone Encounter (Signed)
-----   Message from Glori Luis, MD sent at 05/07/2021  9:14 AM EDT ----- I think it would be a good idea to hold the celebrex and recheck in 2 weeks. Order placed.

## 2021-05-09 NOTE — Telephone Encounter (Signed)
LMTCB patient needs to be advised to stop Celebrex and Schedule Lab visit for repeat BMET in two weeks lab is in.

## 2021-05-24 ENCOUNTER — Other Ambulatory Visit: Payer: Self-pay

## 2021-05-24 ENCOUNTER — Other Ambulatory Visit (INDEPENDENT_AMBULATORY_CARE_PROVIDER_SITE_OTHER): Payer: Medicare HMO

## 2021-05-24 DIAGNOSIS — N179 Acute kidney failure, unspecified: Secondary | ICD-10-CM | POA: Diagnosis not present

## 2021-05-24 LAB — BASIC METABOLIC PANEL
BUN: 12 mg/dL (ref 6–23)
CO2: 31 mEq/L (ref 19–32)
Calcium: 9.4 mg/dL (ref 8.4–10.5)
Chloride: 101 mEq/L (ref 96–112)
Creatinine, Ser: 0.86 mg/dL (ref 0.40–1.20)
GFR: 64.87 mL/min (ref >60.00)
Glucose, Bld: 105 mg/dL — ABNORMAL HIGH (ref 70–99)
Potassium: 4 mEq/L (ref 3.5–5.1)
Sodium: 141 mEq/L (ref 135–145)

## 2021-06-11 ENCOUNTER — Other Ambulatory Visit: Payer: Self-pay | Admitting: Family Medicine

## 2021-06-16 ENCOUNTER — Telehealth: Payer: Self-pay

## 2021-06-16 NOTE — Telephone Encounter (Signed)
Patient is scheduled to be seen in 2 weeks.  Ashley [name],cma

## 2021-06-27 ENCOUNTER — Other Ambulatory Visit: Payer: Self-pay

## 2021-06-27 ENCOUNTER — Ambulatory Visit (INDEPENDENT_AMBULATORY_CARE_PROVIDER_SITE_OTHER): Payer: Medicare HMO

## 2021-06-27 DIAGNOSIS — Z23 Encounter for immunization: Secondary | ICD-10-CM | POA: Diagnosis not present

## 2021-07-04 ENCOUNTER — Ambulatory Visit: Payer: Medicare HMO | Admitting: Adult Health

## 2021-07-15 ENCOUNTER — Other Ambulatory Visit: Payer: Self-pay | Admitting: Family Medicine

## 2021-07-15 DIAGNOSIS — F32A Depression, unspecified: Secondary | ICD-10-CM

## 2021-08-02 ENCOUNTER — Encounter: Payer: Self-pay | Admitting: Family Medicine

## 2021-08-02 ENCOUNTER — Ambulatory Visit (INDEPENDENT_AMBULATORY_CARE_PROVIDER_SITE_OTHER): Payer: Medicare HMO | Admitting: Family Medicine

## 2021-08-02 ENCOUNTER — Other Ambulatory Visit: Payer: Self-pay

## 2021-08-02 DIAGNOSIS — F32A Depression, unspecified: Secondary | ICD-10-CM | POA: Diagnosis not present

## 2021-08-02 DIAGNOSIS — R2689 Other abnormalities of gait and mobility: Secondary | ICD-10-CM | POA: Diagnosis not present

## 2021-08-02 DIAGNOSIS — I10 Essential (primary) hypertension: Secondary | ICD-10-CM | POA: Diagnosis not present

## 2021-08-02 LAB — BASIC METABOLIC PANEL
BUN: 14 mg/dL (ref 6–23)
CO2: 30 mEq/L (ref 19–32)
Calcium: 9.7 mg/dL (ref 8.4–10.5)
Chloride: 99 mEq/L (ref 96–112)
Creatinine, Ser: 0.97 mg/dL (ref 0.40–1.20)
GFR: 56.07 mL/min — ABNORMAL LOW (ref >60.00)
Glucose, Bld: 101 mg/dL — ABNORMAL HIGH (ref 70–99)
Potassium: 3.7 mEq/L (ref 3.5–5.1)
Sodium: 139 mEq/L (ref 135–145)

## 2021-08-02 MED ORDER — BUPROPION HCL ER (XL) 300 MG PO TB24
300.0000 mg | ORAL_TABLET | Freq: Every day | ORAL | 1 refills | Status: DC
Start: 2021-08-02 — End: 2021-10-31

## 2021-08-02 NOTE — Assessment & Plan Note (Signed)
Possibly related to being generally tired.  She reports no vertigo symptoms or lightheadedness.  She has a normal neurological exam today.  We will check a BMP to evaluate her sodium level to ensure that that is not contributing.

## 2021-08-02 NOTE — Assessment & Plan Note (Addendum)
Worsened.  Discussed the option of increasing the Wellbutrin versus seeing a therapist.  She opted to increase Wellbutrin.  She will continue on Effexor 150 mg daily.  If she has any worsening symptoms or notices any side effects of the increased dose of Wellbutrin she will let us know.

## 2021-08-02 NOTE — Patient Instructions (Signed)
Nice to see you. We are going to increase her Wellbutrin to 300 mg once daily.  You may take 2 of your current 150 mg tablets until you run out and then the new prescription will be 1 tablet daily.  If you notice any side effects please let us know.  If you have any worsening depression please let us know.

## 2021-08-02 NOTE — Assessment & Plan Note (Signed)
Adequate control.  She will continue hydrochlorothiazide 25 mg once daily.  We will check a BMP.

## 2021-08-02 NOTE — Progress Notes (Signed)
[name], MD Phone: [phone]  Ashley [name] is a 80 y.o. female who presents today for f/u.  HYPERTENSION Disease Monitoring Home BP Monitoring not checking Chest pain- no    Dyspnea- no Medications Compliance-  taking HCTZ. Lightheadedness-  no  Edema- no BMET    Component Value Date/Time   NA 141 05/24/2021 1035   K 4.0 05/24/2021 1035   CL 101 05/24/2021 1035   CO2 31 05/24/2021 1035   GLUCOSE 105 (H) 05/24/2021 1035   BUN 12 05/24/2021 1035   CREATININE 0.86 05/24/2021 1035   CREATININE 0.69 03/11/2018 1648   CALCIUM 9.4 05/24/2021 1035   GFRNONAA >60 11/22/2020 1340   GFRNONAA 86 03/11/2018 1648   GFRAA >60 05/12/2019 1008   GFRAA 99 03/11/2018 1648   Depression: Patient notes this has worsened.  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  She notes her husband has been sick as well.  She is on Effexor and Wellbutrin.  No SI.  No anxiety.  Balance difficulty: She reports this has been going on a couple of weeks.  She feels as though her feet get going too fast sometimes.  She notes no foot numbness or tingling.  No vertiginous symptoms.  No lightheadedness.  No falls.  Social History   Tobacco Use  Smoking Status Former   Packs/day: 0.50   Years: 15.00   Pack years: 7.50   Types: Cigarettes   Quit date: 08/07/1976   Years since quitting: 45.0  Smokeless Tobacco Never    Current Outpatient Medications on File Prior to Visit  Medication Sig Dispense Refill   acetaminophen (TYLENOL) 650 MG CR tablet Take 1,300 mg by mouth every 8 (eight) hours as needed for pain.     aspirin EC 81 MG tablet Take 81 mg by mouth daily. Swallow whole.     cetirizine (ZYRTEC) 10 MG tablet Take 10 mg by mouth daily.     EPINEPHrine (EPIPEN 2-PAK) 0.3 mg/0.3 mL IJ SOAJ injection Inject 0.3 mLs (0.3 mg total) into the muscle as needed for anaphylaxis. 1 each 1   fluticasone (FLONASE) 50 MCG/ACT nasal  spray Place 1 spray into both nostrils daily.     gabapentin (NEURONTIN) 100 MG capsule Take 1 capsule (100 mg total) by mouth at bedtime. 90 capsule 1   hydrochlorothiazide (HYDRODIURIL) 25 MG tablet TAKE 1 TABLET BY MOUTH DAILY 90 tablet 1   HYDROcodone-acetaminophen (NORCO) 5-325 MG tablet Take 1 tablet by mouth every 4 (four) hours as needed for moderate pain. 8 tablet 0   Melatonin 10 MG TABS Take 10 mg by mouth at bedtime.     omeprazole (PRILOSEC) 20 MG capsule Take 1 capsule (20 mg total) by mouth daily. 30 capsule 5   OVER THE COUNTER MEDICATION Take 2 tablets by mouth daily. Glucocil     rosuvastatin (CRESTOR) 40 MG tablet TAKE 1 TABLET BY MOUTH ONCE DAILY 90 tablet 1   venlafaxine XR (EFFEXOR-XR) 150 MG 24 hr capsule TAKE 1 CAPSULE BY MOUTH EVERY DAY 90 capsule 3   celecoxib (CELEBREX) 200 MG capsule Take 200 mg by mouth 2 (two) times daily. (Patient not taking: Reported on 08/02/2021)     HYDROcodone-acetaminophen (NORCO/VICODIN) 5-325 MG tablet Take 1 tablet by mouth 2 (two) times daily as needed for severe pain. (Patient not taking: Reported on 08/02/2021)     Polyethyl Glycol-Propyl Glycol (SYSTANE) 0.4-0.3 % SOLN Place 1 drop  into both eyes 3 (three) times daily as needed (dry/irritated eyes.). (Patient not taking: Reported on 08/02/2021)     predniSONE (DELTASONE) 20 MG tablet Take 2 tablets (40 mg total) by mouth daily with breakfast. (Patient not taking: Reported on 08/02/2021) 10 tablet 0   No current facility-administered medications on file prior to visit.     ROS see history of present illness  Objective  Physical Exam Vitals:   08/02/21 1038  BP: 130/70  Pulse: (!) 105  Temp: 98.8 F (37.1 C)  SpO2: 95%    BP Readings from Last 3 Encounters:  08/02/21 130/70  03/28/21 130/70  02/21/21 (!) 150/77   Wt Readings from Last 3 Encounters:  08/02/21 156 lb 6.4 oz (70.9 kg)  03/28/21 157 lb 12.8 oz (71.6 kg)  02/21/21 152 lb (68.9 kg)    Physical  Exam Constitutional:      General: She is not in acute distress.    Appearance: She is not diaphoretic.  Cardiovascular:     Rate and Rhythm: Normal rate and regular rhythm.     Heart sounds: Normal heart sounds.  Pulmonary:     Effort: Pulmonary effort is normal.     Breath sounds: Normal breath sounds.  Skin:    General: Skin is warm and dry.  Neurological:     Mental Status: She is alert.     Comments: EOMI, PERRL, opens and closes eyes adequately, V1 through V3 intact light touch bilaterally, hearing intact to finger rub, shoulder shrug intact, 5/5 strength in bilateral biceps, triceps, grip, quads, hamstrings, plantar and dorsiflexion, sensation to light touch intact in bilateral UE and LE, normal gait, normal finger-nose, normal rapid alternating movements, negative Romberg, no pronator drift     Assessment/Plan: Please see individual problem list.  Problem List Items Addressed This Visit     Balance problem    Possibly related to being generally tired.  She reports no vertigo symptoms or lightheadedness.  She has a normal neurological exam today.  We will check a BMP to evaluate her sodium level to ensure that that is not contributing.      Relevant Orders   Basic Metabolic Panel (BMET)   Depression    Worsened.  Discussed the option of increasing the Wellbutrin versus seeing a therapist.  She opted to increase Wellbutrin.  She will continue on Effexor 150 mg daily.  If she has any worsening symptoms or notices any side effects of the increased dose of Wellbutrin she will let us know.      Relevant Medications   buPROPion (WELLBUTRIN XL) 300 MG 24 hr tablet   Essential hypertension    Adequate control.  She will continue hydrochlorothiazide 25 mg once daily.  We will check a BMP.      Relevant Orders   Basic Metabolic Panel (BMET)    Return in about 3 months (around 10/31/2021) for Depression.  This visit occurred during the SARS-CoV-2 public health emergency.   Safety protocols were in place, including screening questions prior to the visit, additional usage of staff PPE, and extensive cleaning of exam room while observing appropriate contact time as indicated for disinfecting solutions.    [name], MD [facility]

## 2021-08-03 ENCOUNTER — Other Ambulatory Visit: Payer: Self-pay | Admitting: Family Medicine

## 2021-08-07 DIAGNOSIS — I619 Nontraumatic intracerebral hemorrhage, unspecified: Secondary | ICD-10-CM

## 2021-08-07 HISTORY — DX: Nontraumatic intracerebral hemorrhage, unspecified: I61.9

## 2021-09-14 ENCOUNTER — Encounter: Payer: Self-pay | Admitting: Family Medicine

## 2021-09-14 ENCOUNTER — Other Ambulatory Visit: Payer: Self-pay

## 2021-09-14 ENCOUNTER — Ambulatory Visit (INDEPENDENT_AMBULATORY_CARE_PROVIDER_SITE_OTHER): Payer: Medicare HMO | Admitting: Family Medicine

## 2021-09-14 VITALS — BP 130/70 | HR 74 | Temp 97.6°F | Ht 59.0 in | Wt 150.0 lb

## 2021-09-14 DIAGNOSIS — R2689 Other abnormalities of gait and mobility: Secondary | ICD-10-CM | POA: Diagnosis not present

## 2021-09-14 DIAGNOSIS — W19XXXA Unspecified fall, initial encounter: Secondary | ICD-10-CM | POA: Diagnosis not present

## 2021-09-14 NOTE — Assessment & Plan Note (Signed)
Patient reports loss of balance.  She had no head injury.  She reports no pain related to the fall.  I suspect the soft tissue enlargement is a hematoma.  Discussed getting x-rays though she deferred this.  She can ice the hematoma.  If it gets larger or if she develops pain she will let us know.  She changes her mind regarding x-ray she will let us know.  Given chronic balance issues we will refer for physical therapy.

## 2021-09-14 NOTE — Progress Notes (Signed)
[name], MD Phone: [phone]  Ashley [name] is a 81 y.o. female who presents today for same-day visit.  Fall: Patient was bringing her husband in for visit today when she fell getting out of the car.  She reports she lost her balance.  She landed on her right forearm.  She notes no head injury.  No loss of consciousness.  She has a bump in the soft tissue on her right forearm.  She does not feel like she broke anything.  She does report intermittent issues with losing her balance.  She notes no lightheadedness or dizziness.  Social History   Tobacco Use  Smoking Status Former   Packs/day: 0.50   Years: 15.00   Pack years: 7.50   Types: Cigarettes   Quit date: 08/07/1976   Years since quitting: 45.1  Smokeless Tobacco Never    Current Outpatient Medications on File Prior to Visit  Medication Sig Dispense Refill   acetaminophen (TYLENOL) 650 MG CR tablet Take 1,300 mg by mouth every 8 (eight) hours as needed for pain.     aspirin EC 81 MG tablet Take 81 mg by mouth daily. Swallow whole.     buPROPion (WELLBUTRIN XL) 300 MG 24 hr tablet Take 1 tablet (300 mg total) by mouth daily. 90 tablet 1   celecoxib (CELEBREX) 200 MG capsule Take 200 mg by mouth 2 (two) times daily.     cetirizine (ZYRTEC) 10 MG tablet Take 10 mg by mouth daily.     EPINEPHrine (EPIPEN 2-PAK) 0.3 mg/0.3 mL IJ SOAJ injection Inject 0.3 mLs (0.3 mg total) into the muscle as needed for anaphylaxis. 1 each 1   fluticasone (FLONASE) 50 MCG/ACT nasal spray Place 1 spray into both nostrils daily.     gabapentin (NEURONTIN) 100 MG capsule Take 1 capsule (100 mg total) by mouth at bedtime. 90 capsule 1   hydrochlorothiazide (HYDRODIURIL) 25 MG tablet TAKE 1 TABLET BY MOUTH DAILY 90 tablet 1   HYDROcodone-acetaminophen (NORCO) 5-325 MG tablet Take 1 tablet by mouth every 4 (four) hours as needed for moderate pain. 8 tablet 0   HYDROcodone-acetaminophen (NORCO/VICODIN) 5-325 MG tablet Take 1 tablet by mouth 2  (two) times daily as needed for severe pain.     Melatonin 10 MG TABS Take 10 mg by mouth at bedtime.     omeprazole (PRILOSEC) 20 MG capsule Take 1 capsule (20 mg total) by mouth daily. 30 capsule 5   OVER THE COUNTER MEDICATION Take 2 tablets by mouth daily. Glucocil     Polyethyl Glycol-Propyl Glycol (SYSTANE) 0.4-0.3 % SOLN Place 1 drop into both eyes 3 (three) times daily as needed (dry/irritated eyes.).     predniSONE (DELTASONE) 20 MG tablet Take 2 tablets (40 mg total) by mouth daily with breakfast. 10 tablet 0   rosuvastatin (CRESTOR) 40 MG tablet TAKE 1 TABLET BY MOUTH ONCE DAILY 90 tablet 1   venlafaxine XR (EFFEXOR-XR) 150 MG 24 hr capsule TAKE 1 CAPSULE BY MOUTH EVERY DAY 90 capsule 3   No current facility-administered medications on file prior to visit.     ROS see history of present illness  Objective  Physical Exam Vitals:   09/14/21 1210  BP: 130/70  Pulse: 74  Temp: 97.6 F (36.4 C)  SpO2: 94%    BP Readings from Last 3 Encounters:  09/14/21 130/70  08/02/21 130/70  03/28/21 130/70   Wt Readings from Last 3 Encounters:  09/14/21 150 lb (68 kg)  08/02/21 156 lb 6.4  oz (70.9 kg)  03/28/21 157 lb 12.8 oz (71.6 kg)    Physical Exam Musculoskeletal:     Comments: Right forearm with a soft tissue enlargement in the mid forearm, there is no bony tenderness of the forearm or the wrist, no tenderness of the soft tissue enlargement     Assessment/Plan: Please see individual problem list.  Problem List Items Addressed This Visit     Balance problem   Relevant Orders   Ambulatory referral to Physical Therapy   Fall - Primary    Patient reports loss of balance.  She had no head injury.  She reports no pain related to the fall.  I suspect the soft tissue enlargement is a hematoma.  Discussed getting x-rays though she deferred this.  She can ice the hematoma.  If it gets larger or if she develops pain she will let us know.  She changes her mind regarding x-ray  she will let us know.  Given chronic balance issues we will refer for physical therapy.      Relevant Orders   Ambulatory referral to Physical Therapy    Return if symptoms worsen or fail to improve.  This visit occurred during the SARS-CoV-2 public health emergency.  Safety protocols were in place, including screening questions prior to the visit, additional usage of staff PPE, and extensive cleaning of exam room while observing appropriate contact time as indicated for disinfecting solutions.    [name], MD [facility]

## 2021-09-14 NOTE — Patient Instructions (Signed)
Nice to see you. You can use ice on the area on your right forearm.  If you have increasing pain or increasing swelling please let us know.  If you change your mind on the x-ray please let us know as well.

## 2021-09-19 ENCOUNTER — Other Ambulatory Visit: Payer: Self-pay | Admitting: Family Medicine

## 2021-10-04 ENCOUNTER — Other Ambulatory Visit: Payer: Self-pay | Admitting: Family Medicine

## 2021-10-14 ENCOUNTER — Other Ambulatory Visit: Payer: Self-pay | Admitting: Family Medicine

## 2021-10-31 ENCOUNTER — Encounter: Payer: Self-pay | Admitting: Family Medicine

## 2021-10-31 ENCOUNTER — Ambulatory Visit (INDEPENDENT_AMBULATORY_CARE_PROVIDER_SITE_OTHER): Payer: Medicare HMO | Admitting: Family Medicine

## 2021-10-31 ENCOUNTER — Other Ambulatory Visit: Payer: Self-pay

## 2021-10-31 DIAGNOSIS — M79671 Pain in right foot: Secondary | ICD-10-CM | POA: Diagnosis not present

## 2021-10-31 DIAGNOSIS — I10 Essential (primary) hypertension: Secondary | ICD-10-CM

## 2021-10-31 DIAGNOSIS — F32A Depression, unspecified: Secondary | ICD-10-CM

## 2021-10-31 DIAGNOSIS — R2689 Other abnormalities of gait and mobility: Secondary | ICD-10-CM | POA: Diagnosis not present

## 2021-10-31 MED ORDER — BUPROPION HCL ER (XL) 300 MG PO TB24: 300.0000 mg | ORAL_TABLET | Freq: Every day | ORAL | 1 refills | Status: DC

## 2021-10-31 NOTE — Progress Notes (Signed)
?[name], MD ?Phone: [phone] ? ?Ashley [name] is a 81 y.o. female who presents today for f/u. ? ?HYPERTENSION ?Disease Monitoring ?Home BP Monitoring not checking Chest pain- no    Dyspnea- no ?Medications ?Compliance-  taking HCTZ.  Edema- no ?BMET ?   ?Component Value Date/Time  ? NA 139 08/02/2021 1058  ? K 3.7 08/02/2021 1058  ? CL 99 08/02/2021 1058  ? CO2 30 08/02/2021 1058  ? GLUCOSE 101 (H) 08/02/2021 1058  ? BUN 14 08/02/2021 1058  ? CREATININE 0.97 08/02/2021 1058  ? CREATININE 0.69 03/11/2018 1648  ? CALCIUM 9.7 08/02/2021 1058  ? GFRNONAA >60 11/22/2020 1340  ? GFRNONAA 86 03/11/2018 1648  ? GFRAA >60 05/12/2019 1008  ? GFRAA 99 03/11/2018 1648  ? ?Depression: Patient notes she is fighting with this.  She has a lot to deal with with her husband.  She notes no SI.  She did not increase the Wellbutrin the last time as we discussed.  She is on Effexor as well. ? ?Balance difficulty: Patient notes no recent falls.  Notes she is taking it slow.  Notes she is doing relatively well with this. ? ?Right foot pain: Patient notes this has been going on for a while.  The top of her foot hurts particularly when she stands on it for a while.  Notes it is intermittent.  She sees podiatry later this week. ? ?Social History  ? ?Tobacco Use  ?Smoking Status Former  ? Packs/day: 0.50  ? Years: 15.00  ? Pack years: 7.50  ? Types: Cigarettes  ? Quit date: 08/07/1976  ? Years since quitting: 45.2  ?Smokeless Tobacco Never  ? ? ?Current Outpatient Medications on File Prior to Visit  ?Medication Sig Dispense Refill  ? acetaminophen (TYLENOL) 650 MG CR tablet Take 1,300 mg by mouth every 8 (eight) hours as needed for pain.    ? aspirin EC 81 MG tablet Take 81 mg by mouth daily. Swallow whole.    ? cetirizine (ZYRTEC) 10 MG tablet Take 10 mg by mouth daily.    ? EPINEPHrine (EPIPEN 2-PAK) 0.3 mg/0.3 mL IJ SOAJ injection Inject 0.3 mLs (0.3 mg total) into the muscle as needed for anaphylaxis. 1 each 1  ? fluticasone  (FLONASE) 50 MCG/ACT nasal spray Place 1 spray into both nostrils daily.    ? gabapentin (NEURONTIN) 100 MG capsule Take 1 capsule (100 mg total) by mouth at bedtime. 90 capsule 1  ? hydrochlorothiazide (HYDRODIURIL) 25 MG tablet TAKE 1 TABLET BY MOUTH DAILY 90 tablet 1  ? Melatonin 10 MG TABS Take 10 mg by mouth at bedtime.    ? omeprazole (PRILOSEC) 20 MG capsule TAKE 1 CAPSULE EVERY DAY 30 capsule 5  ? OVER THE COUNTER MEDICATION Take 2 tablets by mouth daily. Glucocil    ? Polyethyl Glycol-Propyl Glycol (SYSTANE) 0.4-0.3 % SOLN Place 1 drop into both eyes 3 (three) times daily as needed (dry/irritated eyes.).    ? predniSONE (DELTASONE) 20 MG tablet Take 2 tablets (40 mg total) by mouth daily with breakfast. 10 tablet 0  ? rosuvastatin (CRESTOR) 40 MG tablet TAKE 1 TABLET BY MOUTH ONCE DAILY 90 tablet 1  ? venlafaxine XR (EFFEXOR-XR) 150 MG 24 hr capsule TAKE 1 CAPSULE BY MOUTH EVERY DAY 90 capsule 3  ? celecoxib (CELEBREX) 200 MG capsule Take 200 mg by mouth 2 (two) times daily. (Patient not taking: Reported on 10/31/2021)    ? ?No current facility-administered medications on file prior to visit.  ? ? ? ?  ROS see history of present illness ? ?Objective ? ?Physical Exam ?Vitals:  ? 10/31/21 1435  ?BP: 120/80  ?Pulse: (!) 104  ?Temp: 97.9 ?F (36.6 ?C)  ?SpO2: 96%  ? ? ?BP Readings from Last 3 Encounters:  ?10/31/21 120/80  ?09/14/21 130/70  ?08/02/21 130/70  ? ?Wt Readings from Last 3 Encounters:  ?10/31/21 151 lb (68.5 kg)  ?09/14/21 150 lb (68 kg)  ?08/02/21 156 lb 6.4 oz (70.9 kg)  ? ? ?Physical Exam ?Constitutional:   ?   General: She is not in acute distress. ?   Appearance: She is not diaphoretic.  ?Cardiovascular:  ?   Rate and Rhythm: Normal rate and regular rhythm.  ?   Heart sounds: Normal heart sounds.  ?Pulmonary:  ?   Effort: Pulmonary effort is normal.  ?   Breath sounds: Normal breath sounds.  ?Musculoskeletal:  ?   Comments: No tenderness to palpation of her right medial or lateral malleoli, no  navicular tenderness, no fifth metatarsal tenderness, no tenderness of the dorsum of her foot, her ankle does appear slightly puffy on the right  ?Skin: ?   General: Skin is warm and dry.  ?Neurological:  ?   Mental Status: She is alert.  ? ? ? ?Assessment/Plan: Please see individual problem list. ? ?Problem List Items Addressed This Visit   ? ? Depression (Chronic)  ?  We will increase her Wellbutrin to 300 mg once daily.  She will continue her Effexor 150 mg once daily.  If she notices any side effects with the increased dose of Wellbutrin she will let us know.  I did offer referral for therapy though she declines.  She notes she is engaging in resources that she has been given previously to help with her situation. ?  ?  ? Relevant Medications  ? buPROPion (WELLBUTRIN XL) 300 MG 24 hr tablet  ? Essential hypertension (Chronic)  ?  Well-controlled.  She will continue hydrochlorothiazide 25 mg once daily. ?  ?  ? Balance problem  ?  Reports she is taking it slower and generally this is going well.  I did discuss referral for physical therapy though she declines this today. ?  ?  ? Right foot pain  ?  She will see podiatry as planned. ?  ?  ? ? ?Return in about 3 months (around 01/31/2022) for depression. ? ?This visit occurred during the SARS-CoV-2 public health emergency.  Safety protocols were in place, including screening questions prior to the visit, additional usage of staff PPE, and extensive cleaning of exam room while observing appropriate contact time as indicated for disinfecting solutions.  ? ? ?[name], MD ?[facility] [place] ? ?

## 2021-10-31 NOTE — Patient Instructions (Signed)
Nice to see you. ?We will increase your wellbutrin to 300 mg once daily. If you notice any side effects with this please let me know.  ?

## 2021-10-31 NOTE — Assessment & Plan Note (Signed)
Well-controlled.  She will continue hydrochlorothiazide 25 mg once daily. ?

## 2021-10-31 NOTE — Assessment & Plan Note (Signed)
We will increase her Wellbutrin to 300 mg once daily.  She will continue her Effexor 150 mg once daily.  If she notices any side effects with the increased dose of Wellbutrin she will let us know.  I did offer referral for therapy though she declines.  She notes she is engaging in resources that she has been given previously to help with her situation. ?

## 2021-10-31 NOTE — Assessment & Plan Note (Signed)
Reports she is taking it slower and generally this is going well.  I did discuss referral for physical therapy though she declines this today. ?

## 2021-10-31 NOTE — Assessment & Plan Note (Signed)
She will see podiatry as planned. 

## 2021-11-01 ENCOUNTER — Telehealth: Payer: Self-pay | Admitting: Family Medicine

## 2021-11-01 NOTE — Telephone Encounter (Signed)
Copied from CRM [id number]. Topic: Medicare AWV ?>> Nov 01, 2021 11:13 AM Harris-Coley, Avon Gully wrote: ?Reason for CRM: Left message for patient to schedule Annual Wellness Visit.  Please schedule with Nurse Health Advisor Denisa O'Brien-Blaney, LPN at [facility].  Please call [phone] ask for Olegario Messier ?

## 2021-12-20 ENCOUNTER — Other Ambulatory Visit: Payer: Self-pay | Admitting: Family Medicine

## 2021-12-21 ENCOUNTER — Telehealth: Payer: Self-pay | Admitting: Family Medicine

## 2021-12-21 NOTE — Telephone Encounter (Signed)
VM for patient to call back.  Kayela Humphres,cma  °

## 2021-12-21 NOTE — Telephone Encounter (Signed)
Patient started yesterday with dizzy, loss of balance, a little nausea. Pass history of mini strokes. Patient transferred to Access Nurse.  ?

## 2021-12-21 NOTE — Telephone Encounter (Signed)
Patient came in to the office and I informed her that she needed to go to [facility] ER or to Urgent care and sh decided to go to Urgent care.  Casimiro Lienhard,cma  ?

## 2021-12-22 ENCOUNTER — Other Ambulatory Visit: Payer: Self-pay

## 2021-12-22 ENCOUNTER — Emergency Department: Payer: Medicare HMO

## 2021-12-22 ENCOUNTER — Ambulatory Visit: Payer: Medicare HMO

## 2021-12-22 ENCOUNTER — Emergency Department
Admission: EM | Admit: 2021-12-22 | Discharge: 2021-12-22 | Disposition: A | Discharge status: Home / Self Care | Payer: Medicare HMO | Attending: Student in an Organized Health Care Education/Training Program | Admitting: Student in an Organized Health Care Education/Training Program

## 2021-12-22 DIAGNOSIS — R42 Dizziness and giddiness: Secondary | ICD-10-CM | POA: Diagnosis present

## 2021-12-22 DIAGNOSIS — G459 Transient cerebral ischemic attack, unspecified: Secondary | ICD-10-CM | POA: Diagnosis not present

## 2021-12-22 DIAGNOSIS — F419 Anxiety disorder, unspecified: Secondary | ICD-10-CM | POA: Diagnosis not present

## 2021-12-22 DIAGNOSIS — Z79899 Other long term (current) drug therapy: Secondary | ICD-10-CM | POA: Diagnosis not present

## 2021-12-22 LAB — URINE DRUG SCREEN, QUALITATIVE (ARMC ONLY)
Amphetamines, Ur Screen: NOT DETECTED
Barbiturates, Ur Screen: NOT DETECTED
Benzodiazepine, Ur Scrn: NOT DETECTED
Cannabinoid 50 Ng, Ur ~~LOC~~: NOT DETECTED
Cocaine Metabolite,Ur ~~LOC~~: NOT DETECTED
MDMA (Ecstasy)Ur Screen: NOT DETECTED
Methadone Scn, Ur: NOT DETECTED
Opiate, Ur Screen: NOT DETECTED
Phencyclidine (PCP) Ur S: NOT DETECTED
Tricyclic, Ur Screen: NOT DETECTED

## 2021-12-22 LAB — CBC
HCT: 42 % (ref 36.0–46.0)
Hemoglobin: 13 g/dL (ref 12.0–15.0)
MCH: 28.8 pg (ref 26.0–34.0)
MCHC: 31 g/dL (ref 30.0–36.0)
MCV: 92.9 fL (ref 80.0–100.0)
Platelets: 289 10*3/uL (ref 150–400)
RBC: 4.52 MIL/uL (ref 3.87–5.11)
RDW: 13.7 % (ref 11.5–15.5)
WBC: 8.8 10*3/uL (ref 4.0–10.5)
nRBC: 0 % (ref 0.0–0.2)

## 2021-12-22 LAB — URINALYSIS, ROUTINE W REFLEX MICROSCOPIC
Bilirubin Urine: NEGATIVE
Glucose, UA: NEGATIVE mg/dL
Hgb urine dipstick: NEGATIVE
Ketones, ur: NEGATIVE mg/dL
Leukocytes,Ua: NEGATIVE
Nitrite: NEGATIVE
Protein, ur: NEGATIVE mg/dL
Specific Gravity, Urine: 1.012 (ref 1.005–1.030)
pH: 7 (ref 5.0–8.0)

## 2021-12-22 LAB — BASIC METABOLIC PANEL
Anion gap: 7 (ref 5–15)
BUN: 13 mg/dL (ref 8–23)
CO2: 25 mmol/L (ref 22–32)
Calcium: 9.1 mg/dL (ref 8.9–10.3)
Chloride: 106 mmol/L (ref 98–111)
Creatinine, Ser: 0.89 mg/dL (ref 0.44–1.00)
GFR, Estimated: >60 mL/min (ref >60)
Glucose, Bld: 109 mg/dL — ABNORMAL HIGH (ref 70–99)
Potassium: 3.8 mmol/L (ref 3.5–5.1)
Sodium: 138 mmol/L (ref 135–145)

## 2021-12-22 LAB — CBG MONITORING, ED: Glucose-Capillary: 124 mg/dL — ABNORMAL HIGH (ref 70–99)

## 2021-12-22 MED ORDER — MECLIZINE HCL 12.5 MG PO TABS: 12.5000 mg | ORAL_TABLET | Freq: Three times a day (TID) | ORAL | 0 refills | Status: DC | PRN

## 2021-12-22 MED ORDER — LORAZEPAM 0.5 MG PO TABS
0.5000 mg | ORAL_TABLET | Freq: Once | ORAL | Status: AC
Start: 2021-12-22 — End: 2021-12-22
  Administered 2021-12-22: 0.5 mg via ORAL
  Filled 2021-12-22: qty 1

## 2021-12-22 NOTE — ED Triage Notes (Signed)
Pt states that starting this past Tuesday she has been experiencing dizziness, nervousness, and feeling shaky. Pt denies nausea or vomiting. Pt states that she feels better when she sits down.

## 2021-12-22 NOTE — ED Notes (Signed)
Pt states she feels dizzy and confused. Pt answers all orientation questions appropriately

## 2021-12-22 NOTE — ED Provider Notes (Signed)
[facility] Provider Note    Event Date/Time   First MD Initiated Contact with Patient 12/22/21 1637     (approximate)   History   Dizziness   HPI  Ashley [name] is a 81 y.o. female with reported history of anxiety depression as well as reported TIAs that she cannot recall what symptoms she had for these presents to the ER for evaluation of dizziness and fall over the past several days.  States that she just woke up 1 morning feeling very dizzy and unsteady with ambulating.  This is new for her.  Denies any headaches.  No numbness or tingling.  She states that her daughter says that she is having trouble finishing her sentences.  Denies any pain or discomfort anywhere no nausea or vomiting.  No new medication changes.     Physical Exam   Triage Vital Signs: ED Triage Vitals [12/22/21 1447]  Enc Vitals Group     BP (!) 143/73     Pulse Rate 87     Resp 16     Temp 98 F (36.7 C)     Temp Source Oral     SpO2 96 %     Weight 145 lb (65.8 kg)     Height 4\' 11"  (1.499 m)     Head Circumference      Peak Flow      Pain Score 0     Pain Loc      Pain Edu?      Excl. in GC?     Most recent vital signs: Vitals:   12/22/21 1900 12/22/21 1930  BP: (!) 157/77 (!) 166/97  Pulse: 80 87  Resp:  17  Temp:    SpO2: 95% 96%     Constitutional: Alert  Eyes: Conjunctivae are normal.  Head: Atraumatic. Nose: No congestion/rhinnorhea. Mouth/Throat: Mucous membranes are moist.   Neck: Painless ROM.  Cardiovascular:   Good peripheral circulation. Respiratory: Normal respiratory effort.  No retractions.  Gastrointestinal: Soft and nontender.  Musculoskeletal:  no deformity Neurologic:  MAE spontaneously. No gross focal neurologic deficits are appreciated.  Skin:  Skin is warm, dry and intact. No rash noted. Psychiatric: Mood and affect are normal. Speech and behavior are normal.    ED Results / Procedures / Treatments   Labs (all labs  ordered are listed, but only abnormal results are displayed) Labs Reviewed  BASIC METABOLIC PANEL - Abnormal; Notable for the following components:      Result Value   Glucose, Bld 109 (*)    All other components within normal limits  URINALYSIS, ROUTINE W REFLEX MICROSCOPIC - Abnormal; Notable for the following components:   Color, Urine YELLOW (*)    APPearance CLEAR (*)    All other components within normal limits  CBG MONITORING, ED - Abnormal; Notable for the following components:   Glucose-Capillary 124 (*)    All other components within normal limits  CBC  URINE DRUG SCREEN, QUALITATIVE ([facility] ONLY)     EKG  ED ECG REPORT I, 12/24/21, the attending physician, personally viewed and interpreted this ECG.   Date: 12/22/2021  EKG Time: 14:50  Rate: 95  Rhythm: sinus  Axis: normal  Intervals:normal  ST&T Change: no stemi, no depression    RADIOLOGY Please see ED Course for my review and interpretation.  I personally reviewed all radiographic images ordered to evaluate for the above acute complaints and reviewed radiology reports and findings.  These findings were  personally discussed with the patient.  Please see medical record for radiology report.    PROCEDURES:  Critical Care performed: No  Procedures   MEDICATIONS ORDERED IN ED: Medications  LORazepam (ATIVAN) tablet 0.5 mg (0.5 mg Oral Given 12/22/21 2000)     IMPRESSION / MDM / ASSESSMENT AND PLAN / ED COURSE  I reviewed the triage vital signs and the nursing notes.                              Differential diagnosis includes, but is not limited to, cva, tia, hypoglycemia, dehydration, electrolyte abnormality, dissection, sepsis  Patient presented to the ER for evaluation of symptoms as described above.  She is outside of the window for tPA given last seen normal several days prior.  Also concern for recent fall and possible subdural hematoma.  This presenting complaint could reflect a  potentially life-threatening illness therefore the patient will be placed on continuous pulse oximetry and telemetry for monitoring.  Laboratory evaluation will be sent to evaluate for the above complaints.  Will order CT head to further evaluate.  Clinical Course as of 12/22/21 2200  Thu Dec 22, 2021  1740 CT head on my interpretation shows no evidence of bleed.  I am going to order MRI to further evaluate given concern for CVA [PR]  2156 MRI with no acute abnormality.  Patient with no acute additional concerns.  Denies any dizziness or unsteadiness at this time.  Does appear stable appropriate for outpatient follow-up.  Will be given prescription for Antivert.  Discuss strict return precautions. [PR]    Clinical Course User Index [PR] Willy Eddy, MD     FINAL CLINICAL IMPRESSION(S) / ED DIAGNOSES   Final diagnoses:  Dizziness     Rx / DC Orders   ED Discharge Orders          Ordered    meclizine (ANTIVERT) 12.5 MG tablet  3 times daily PRN        12/22/21 2157             Note:  This document was prepared using Dragon voice recognition software and may include unintentional dictation errors.    Willy Eddy, MD 12/22/21 2200

## 2021-12-28 ENCOUNTER — Emergency Department: Payer: Medicare HMO

## 2021-12-28 ENCOUNTER — Other Ambulatory Visit: Payer: Self-pay

## 2021-12-28 ENCOUNTER — Inpatient Hospital Stay: Payer: Medicare HMO | Admitting: Family Medicine

## 2021-12-28 ENCOUNTER — Inpatient Hospital Stay
Admission: EM | Admit: 2021-12-28 | Discharge: 2021-12-30 | DRG: 087 | Disposition: A | Discharge status: Home Health | Payer: Medicare HMO | Attending: Hospitalist | Admitting: Hospitalist

## 2021-12-28 DIAGNOSIS — I615 Nontraumatic intracerebral hemorrhage, intraventricular: Secondary | ICD-10-CM | POA: Diagnosis not present

## 2021-12-28 DIAGNOSIS — E538 Deficiency of other specified B group vitamins: Secondary | ICD-10-CM

## 2021-12-28 DIAGNOSIS — Z87891 Personal history of nicotine dependence: Secondary | ICD-10-CM

## 2021-12-28 DIAGNOSIS — S06350A Traumatic hemorrhage of left cerebrum without loss of consciousness, initial encounter: Principal | ICD-10-CM | POA: Diagnosis present

## 2021-12-28 DIAGNOSIS — K219 Gastro-esophageal reflux disease without esophagitis: Secondary | ICD-10-CM | POA: Diagnosis present

## 2021-12-28 DIAGNOSIS — Z8261 Family history of arthritis: Secondary | ICD-10-CM

## 2021-12-28 DIAGNOSIS — Z825 Family history of asthma and other chronic lower respiratory diseases: Secondary | ICD-10-CM

## 2021-12-28 DIAGNOSIS — Y92017 Garden or yard in single-family (private) house as the place of occurrence of the external cause: Secondary | ICD-10-CM

## 2021-12-28 DIAGNOSIS — I619 Nontraumatic intracerebral hemorrhage, unspecified: Secondary | ICD-10-CM | POA: Diagnosis present

## 2021-12-28 DIAGNOSIS — S022XXA Fracture of nasal bones, initial encounter for closed fracture: Secondary | ICD-10-CM

## 2021-12-28 DIAGNOSIS — F419 Anxiety disorder, unspecified: Secondary | ICD-10-CM | POA: Diagnosis present

## 2021-12-28 DIAGNOSIS — M48061 Spinal stenosis, lumbar region without neurogenic claudication: Secondary | ICD-10-CM | POA: Diagnosis present

## 2021-12-28 DIAGNOSIS — Z7982 Long term (current) use of aspirin: Secondary | ICD-10-CM

## 2021-12-28 DIAGNOSIS — Z23 Encounter for immunization: Secondary | ICD-10-CM

## 2021-12-28 DIAGNOSIS — R296 Repeated falls: Secondary | ICD-10-CM

## 2021-12-28 DIAGNOSIS — Z888 Allergy status to other drugs, medicaments and biological substances status: Secondary | ICD-10-CM

## 2021-12-28 DIAGNOSIS — I10 Essential (primary) hypertension: Secondary | ICD-10-CM | POA: Diagnosis not present

## 2021-12-28 DIAGNOSIS — S00212A Abrasion of left eyelid and periocular area, initial encounter: Secondary | ICD-10-CM | POA: Diagnosis present

## 2021-12-28 DIAGNOSIS — Z8041 Family history of malignant neoplasm of ovary: Secondary | ICD-10-CM

## 2021-12-28 DIAGNOSIS — W010XXA Fall on same level from slipping, tripping and stumbling without subsequent striking against object, initial encounter: Secondary | ICD-10-CM | POA: Diagnosis present

## 2021-12-28 DIAGNOSIS — Z8249 Family history of ischemic heart disease and other diseases of the circulatory system: Secondary | ICD-10-CM

## 2021-12-28 DIAGNOSIS — Y9301 Activity, walking, marching and hiking: Secondary | ICD-10-CM | POA: Diagnosis present

## 2021-12-28 DIAGNOSIS — E785 Hyperlipidemia, unspecified: Secondary | ICD-10-CM | POA: Diagnosis present

## 2021-12-28 DIAGNOSIS — Z7952 Long term (current) use of systemic steroids: Secondary | ICD-10-CM

## 2021-12-28 DIAGNOSIS — Z8673 Personal history of transient ischemic attack (TIA), and cerebral infarction without residual deficits: Secondary | ICD-10-CM

## 2021-12-28 DIAGNOSIS — Z791 Long term (current) use of non-steroidal anti-inflammatories (NSAID): Secondary | ICD-10-CM

## 2021-12-28 DIAGNOSIS — F32A Depression, unspecified: Secondary | ICD-10-CM | POA: Diagnosis present

## 2021-12-28 LAB — CBC WITH DIFFERENTIAL/PLATELET
Abs Immature Granulocytes: 0.05 10*3/uL (ref 0.00–0.07)
Basophils Absolute: 0.1 10*3/uL (ref 0.0–0.1)
Basophils Relative: 1 %
Eosinophils Absolute: 0.2 10*3/uL (ref 0.0–0.5)
Eosinophils Relative: 3 %
HCT: 45.2 % (ref 36.0–46.0)
Hemoglobin: 14.1 g/dL (ref 12.0–15.0)
Immature Granulocytes: 1 %
Lymphocytes Relative: 23 %
Lymphs Abs: 2.1 10*3/uL (ref 0.7–4.0)
MCH: 29.1 pg (ref 26.0–34.0)
MCHC: 31.2 g/dL (ref 30.0–36.0)
MCV: 93.4 fL (ref 80.0–100.0)
Monocytes Absolute: 0.7 10*3/uL (ref 0.1–1.0)
Monocytes Relative: 8 %
Neutro Abs: 6.1 10*3/uL (ref 1.7–7.7)
Neutrophils Relative %: 64 %
Platelets: 304 10*3/uL (ref 150–400)
RBC: 4.84 MIL/uL (ref 3.87–5.11)
RDW: 13.8 % (ref 11.5–15.5)
WBC: 9.3 10*3/uL (ref 4.0–10.5)
nRBC: 0 % (ref 0.0–0.2)

## 2021-12-28 LAB — BASIC METABOLIC PANEL
Anion gap: 10 (ref 5–15)
BUN: 14 mg/dL (ref 8–23)
CO2: 28 mmol/L (ref 22–32)
Calcium: 9.4 mg/dL (ref 8.9–10.3)
Chloride: 99 mmol/L (ref 98–111)
Creatinine, Ser: 0.95 mg/dL (ref 0.44–1.00)
GFR, Estimated: >60 mL/min (ref >60)
Glucose, Bld: 103 mg/dL — ABNORMAL HIGH (ref 70–99)
Potassium: 4.5 mmol/L (ref 3.5–5.1)
Sodium: 137 mmol/L (ref 135–145)

## 2021-12-28 LAB — PROTIME-INR
INR: 1 (ref 0.8–1.2)
Prothrombin Time: 13.4 seconds (ref 11.4–15.2)

## 2021-12-28 LAB — APTT: aPTT: 31 seconds (ref 24–36)

## 2021-12-28 MED ORDER — LORAZEPAM 2 MG/ML IJ SOLN
1.0000 mg | Freq: Once | INTRAMUSCULAR | Status: AC
  Administered 2021-12-28: 1 mg via INTRAVENOUS
  Filled 2021-12-28: qty 1

## 2021-12-28 MED ORDER — IOHEXOL 350 MG/ML SOLN
75.0000 mL | Freq: Once | INTRAVENOUS | Status: AC | PRN
  Administered 2021-12-28: 75 mL via INTRAVENOUS

## 2021-12-28 MED ORDER — TETANUS-DIPHTH-ACELL PERTUSSIS 5-2.5-18.5 LF-MCG/0.5 IM SUSY
0.5000 mL | PREFILLED_SYRINGE | Freq: Once | INTRAMUSCULAR | Status: AC
  Administered 2021-12-28: 0.5 mL via INTRAMUSCULAR
  Filled 2021-12-28: qty 0.5

## 2021-12-28 NOTE — ED Triage Notes (Signed)
Pt comes into the ED via EMS from home with c/o trip and fall outside striking her face, pt has abrasions and cuts to her face with controlled bleeding , bruising and swelling to the nose and around the left eye. Denies LOC, is not on blood thinners. Pt is a/ox4   152/90 97HR 96%RA

## 2021-12-28 NOTE — H&P (Incomplete)
History and Physical    Patient: Ashley [name] DOB: [birth year] DOA: 12/28/2021 DOS: the patient was seen and examined on 12/28/2021 PCP: Leone Haven, MD  Patient coming from: Home  Chief Complaint:  Chief Complaint  Patient presents with  . Fall   HPI: Ashley [name] is a 81 y.o. female with medical history significant of ***  Review of Systems: {ROS_Text:26778} Past Medical History:  Diagnosis Date  . Abnormal uterine bleeding (AUB)    a. benign endometrial polyps on D+C in 03/2017  . Anxiety   . Arthritis   . Carpal tunnel syndrome of left wrist   . Chicken pox   . Depression   . GERD (gastroesophageal reflux disease)   . History of stress test    a. 08/2017 Lexiscan MV: EF 55-65%. No ischemia/infarct. Low risk.  Marland Kitchen Hyperlipidemia   . Hypertension   . 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 Dx in setting of dizziness/vertigo; b. 02/2018 Carotid U/S: bilateral intimal thickening and atherosclerotic plaque. No hemodynamically significant stenoses; c. 03/2018 MRI Brain: No mass or abnl enhancement. Chronic microvascular ischemic changes and mod volume loss of the brain. 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  Marland Kitchen Spinal stenosis of lumbar region with radiculopathy 2019  . Stroke ([place])    PT STATES SHE WAS TOLD THIS BY HER PCP BUT PT STATES SHE NEVER KNEW SHE HAD A STROKE-NO RESIDUAL EFFECTS  . Vertigo    Past Surgical History:  Procedure Laterality Date  . BACK SURGERY  11/19/2017   lumbar laminectomy  . CARPAL TUNNEL RELEASE Right 07/30/2019  . CARPAL TUNNEL RELEASE Left 02/21/2021   Procedure: CARPAL TUNNEL RELEASE;  Surgeon: Dereck Leep, MD;  Location: [facility] ORS;  Service: Orthopedics;  Laterality: Left;  . COLONOSCOPY     2003, 2011, 2021  . COLONOSCOPY WITH PROPOFOL N/A 12/24/2019   Procedure: COLONOSCOPY WITH PROPOFOL;  Surgeon: Toledo, [name], MD;  Location: [facility] ENDOSCOPY;  Service: Gastroenterology;  Laterality: N/A;  .  CYSTOSCOPY MACROPLASTIQUE IMPLANT N/A 05/15/2019   Procedure: CYSTOSCOPY MACROPLASTIQUE IMPLANT;  Surgeon: Royston Cowper, MD;  Location: [facility] ORS;  Service: Urology;  Laterality: N/A;  . DILATATION & CURETTAGE/HYSTEROSCOPY WITH MYOSURE N/A 03/30/2017   Procedure: DILATATION & CURETTAGE/HYSTEROSCOPY WITH MYOSURE;  Surgeon: Schermerhorn, Gwen Her, MD;  Location: [facility] ORS;  Service: Gynecology;  Laterality: N/A;  . EYE SURGERY Bilateral    Cataract Extraction with IOL  . JOINT REPLACEMENT    . KNEE ARTHROPLASTY Left 12/01/2020   Procedure: COMPUTER ASSISTED TOTAL KNEE ARTHROPLASTY;  Surgeon: Dereck Leep, MD;  Location: [facility] ORS;  Service: Orthopedics;  Laterality: Left;  . SKIN LESION EXCISION Right    synovial cyst right AC joint  . TONSILLECTOMY AND ADENOIDECTOMY     Age 65   Social History:  reports that she quit smoking about 45 years ago. Her smoking use included cigarettes. She has a 7.50 pack-year smoking history. She has never used smokeless tobacco. She reports current alcohol use of about 1.0 standard drink per week. She reports that she does not use drugs.  Allergies  Allergen Reactions  . Ace Inhibitors Other (See Comments)    Angioedema  . Pravastatin Other (See Comments)    Other reaction(s): Muscle Pain  . Simvastatin Other (See Comments)    stiffness    Family History  Problem Relation Age of Onset  . Rheum arthritis Mother   . Ovarian cancer Sister   . COPD Sister   .  CAD Sister   . Heart disease Father   . Aneurysm Father   . Cancer Brother   . Thrombosis Maternal Grandmother        a. DVT  . Thrombosis Maternal Grandfather        a. DVT  . Thrombosis Paternal Grandmother        a. DVT  . Thrombosis Paternal Grandfather        a. DVT    Prior to Admission medications   Medication Sig Start Date End Date Taking? Authorizing Provider  acetaminophen (TYLENOL) 650 MG CR tablet Take 1,300 mg by mouth every 8 (eight) hours as needed for pain.   Yes  [provider]  aspirin EC 81 MG tablet Take 81 mg by mouth daily. Swallow whole.   Yes [provider]  buPROPion (WELLBUTRIN XL) 300 MG 24 hr tablet Take 1 tablet (300 mg total) by mouth daily. 10/31/21  Yes Leone Haven, MD  celecoxib (CELEBREX) 200 MG capsule Take 200 mg by mouth 2 (two) times daily. 07/14/20  Yes [provider]  cetirizine (ZYRTEC) 10 MG tablet Take 10 mg by mouth daily.   Yes [provider]  fluticasone (FLONASE) 50 MCG/ACT nasal spray Place 1 spray into both nostrils daily. 02/21/21  Yes Hooten, Laurice Record, MD  gabapentin (NEURONTIN) 100 MG capsule Take 1 capsule (100 mg total) by mouth at bedtime. 02/21/21  Yes Hooten, Laurice Record, MD  hydrochlorothiazide (HYDRODIURIL) 25 MG tablet TAKE 1 TABLET BY MOUTH DAILY 09/19/21  Yes Leone Haven, MD  meclizine (ANTIVERT) 12.5 MG tablet Take 1 tablet (12.5 mg total) by mouth 3 (three) times daily as needed for dizziness. 12/22/21  Yes Merlyn Lot, MD  Melatonin 10 MG TABS Take 10 mg by mouth at bedtime.   Yes [provider]  omeprazole (PRILOSEC) 20 MG capsule TAKE 1 CAPSULE EVERY DAY 10/05/21  Yes Leone Haven, MD  OVER THE COUNTER MEDICATION Take 2 tablets by mouth daily. Glucocil   Yes [provider]  Polyethyl Glycol-Propyl Glycol (SYSTANE) 0.4-0.3 % SOLN Place 1 drop into both eyes 3 (three) times daily as needed (dry/irritated eyes.).   Yes [provider]  predniSONE (DELTASONE) 20 MG tablet Take 2 tablets (40 mg total) by mouth daily with breakfast. 03/31/21  Yes Leone Haven, MD  rosuvastatin (CRESTOR) 40 MG tablet TAKE 1 TABLET BY MOUTH ONCE DAILY 08/04/21  Yes Leone Haven, MD  traMADol (ULTRAM) 50 MG tablet Take 25-50 mg by mouth 2 (two) times daily as needed. 11/24/21  Yes [provider]  venlafaxine XR (EFFEXOR-XR) 150 MG 24 hr capsule TAKE 1 CAPSULE BY MOUTH EVERY DAY 12/21/21  Yes Leone Haven, MD  EPINEPHrine (EPIPEN  2-PAK) 0.3 mg/0.3 mL IJ SOAJ injection Inject 0.3 mLs (0.3 mg total) into the muscle as needed for anaphylaxis. 02/20/19   Harvest Dark, MD    Physical Exam: Vitals:   12/28/21 1800 12/28/21 1900 12/28/21 1930 12/28/21 2230  BP: (!) 148/64 (!) 165/88 (!) 148/83 (!) 152/84  Pulse: 92 84 96 88  Resp: 17 16 19 17   Temp:      TempSrc:      SpO2: 100% 97% 99% 99%   *** Data Reviewed: {Tip this will not be part of the note when signed- Document your independent interpretation of telemetry tracing, EKG, lab, Radiology test or any other diagnostic tests. Add any new diagnostic test ordered today. (Optional):26781} {Results:26384}  Assessment and Plan: No notes  have been filed under this hospital service. Service: Hospitalist     Advance Care Planning:   Code Status: Prior ***  Consults: ***  Family Communication: ***  Severity of Illness: {Observation/Inpatient:21159}  Author: Para Skeans, MD 12/28/2021 11:39 PM  For on call review [email or web address].

## 2021-12-28 NOTE — ED Provider Notes (Signed)
[facility] Provider Note    Event Date/Time   First MD Initiated Contact with Patient 12/28/21 1457     (approximate)   History   Chief Complaint: Fall   HPI  Ashley [name] is a 81 y.o. female with a history of hypertension, hyperlipidemia, lumbar spinal stenosis, balance issues who comes ED complaining of injury to the face after a fall at home.  She reports that she was petting her cat in the driveway, and when she turned to go back downhill to her house, she lost her balance and fell forward.  She tried to catch herself but did hit her face on the concrete.  Denies loss of consciousness.  Denies lightheadedness or dizziness previously.  Denies any preceding pain syndrome or other acute symptoms.  Denies any other injuries or current pain.  Feels like her vision is normal.  No paresthesias or motor weakness.  She reports that she has had 4 falls in the last 6 weeks which she attributes to ongoing balance issues.  She denies passing out episodes, palpitations, chest pain, shortness of breath, sudden headaches or other acute symptoms.  She was seen in the ED a week ago, had CT and MRI of the brain due to fall and balance issues, both were unremarkable.     Physical Exam   Triage Vital Signs: ED Triage Vitals [12/28/21 1335]  Enc Vitals Group     BP 139/81     Pulse Rate 95     Resp 16     Temp 98.6 F (37 C)     Temp Source Oral     SpO2 93 %     Weight      Height      Head Circumference      Peak Flow      Pain Score      Pain Loc      Pain Edu?      Excl. in GC?     Most recent vital signs: Vitals:   12/28/21 1930 12/28/21 2230  BP: (!) 148/83 (!) 152/84  Pulse: 96 88  Resp: 19 17  Temp:    SpO2: 99% 99%    General: Awake, no distress.  CV:  Good peripheral perfusion.  Regular rate and rhythm, no murmurs Resp:  Normal effort.  Clear to auscultation bilaterally Abd:  No distention.  Soft and nontender Other:  No spinal  tenderness.  Full range of motion of the cervical spine.  No bony point tenderness.  Full range of motion in all extremities.  Small contusions over bilateral hands, which are nontender.  No lacerations.  There is facial trauma consisting of left frontal and maxillary contusion with abrasions.  No bony point tenderness or deformity, no depressions.  No laceration.  Extraocular movements are intact.  Pupils are symmetric.   ED Results / Procedures / Treatments   Labs (all labs ordered are listed, but only abnormal results are displayed) Labs Reviewed  BASIC METABOLIC PANEL - Abnormal; Notable for the following components:      Result Value   Glucose, Bld 103 (*)    All other components within normal limits  CBC WITH DIFFERENTIAL/PLATELET  PROTIME-INR  APTT  URINALYSIS, ROUTINE W REFLEX MICROSCOPIC     EKG Interpreted by me Sinus rhythm rate of 94, normal axis, normal intervals.  Normal QRS ST segments and T waves.  No ischemic changes.   RADIOLOGY CT head viewed and interpreted by me, no large  subdural or epidural hematoma.  No mass.  Radiology report reviewed which does note a punctate area of hemorrhage in the left caudate nucleus.  CT maxillofacial unremarkable.   PROCEDURES:  .1-3 Lead EKG Interpretation Performed by: Sharman Cheek, MD Authorized by: Sharman Cheek, MD     Interpretation: normal     ECG rate:  90   ECG rate assessment: normal     Rhythm: sinus rhythm     Ectopy: none     Conduction: normal     MEDICATIONS ORDERED IN ED: Medications  Tdap (BOOSTRIX) injection 0.5 mL (0.5 mLs Intramuscular Given 12/28/21 1602)  LORazepam (ATIVAN) injection 1 mg (1 mg Intravenous Given 12/28/21 1952)  iohexol (OMNIPAQUE) 350 MG/ML injection 75 mL (75 mLs Intravenous Contrast Given 12/28/21 2010)     IMPRESSION / MDM / ASSESSMENT AND PLAN / ED COURSE  I reviewed the triage vital signs and the nursing notes.                              Differential  diagnosis includes, but is not limited to, intracranial hemorrhage,, facial fracture, dehydration, electrolyte abnormality, coagulopathy, UTI  Patient's presentation is most consistent with acute complicated illness / injury requiring diagnostic workup.  Patient presents with likely mechanical fall which is a recurrent issue to her due to chronic balance issues.  Today she has sustained a small intracranial hemorrhage, likely as a result of trauma given no preceding symptoms prior to the fall today.  I will discuss with neurosurgery.  During exam I notice a malodorous urine smell, so we will obtain urinalysis in addition to labs looking for any signs of coagulopathy on coags, metabolic panel, and blood counts.   Clinical Course as of 12/28/21 2346  Wed Dec 28, 2021  1351 CT Maxillofacial Wo Contrast [CT]  1600 Discussed with neurosurgery, who note that this is an unusual location for traumatic hemorrhage.  With the small size, they feel that patient will be safe for discharge if 6-hour interval CT shows no expansion. [PS]  1800 ICH finding discussed with neurology who recommends CTA head neck and MRI brain.  Recommends overnight observation for further work-up and a.m. neurology evaluation.  Can be admitted here at [facility] if 6-hour CT shows stable hemorrhage without expansion.  This will be performed at 8:00 PM. [PS]    Clinical Course User Index [CT] Chinita Pester, FNP [PS] Sharman Cheek, MD    ----------------------------------------- 11:44 PM on 12/28/2021 ----------------------------------------- Repeat CT imaging shows stable hemorrhage, no expansion or other acute findings.  MRI was obtained which is negative for acute infarct.  Case discussed with hospitalist for further management.   FINAL CLINICAL IMPRESSION(S) / ED DIAGNOSES   Final diagnoses:  Cerebral hemorrhage (HCC)     Rx / DC Orders   ED Discharge Orders     None        Note:  This document was prepared  using Dragon voice recognition software and may include unintentional dictation errors.   Sharman Cheek, MD 12/28/21 [phone]

## 2021-12-28 NOTE — H&P (Signed)
History and Physical    Patient: Ashley [name] [id number] DOB: 10-12-1940 DOA: 12/28/2021 DOS: the patient was seen and examined on 12/29/2021 PCP: Leone Haven, MD  Patient coming from: Home  Chief Complaint:  Chief Complaint  Patient presents with   Fall   HPI: Ashley [name] is a 81 y.o. female with medical history significant of anxiety, GERD, hypertension, history of stroke in 2019 presenting with a fall today and injuring her face patient was walking down to get her cat and fell face forward.  She has multiple abrasions and cuts and bruises and bleeding and swelling to her face particularly worse around the left eye and left cheekbone. Patient does not take any blood thinners states that she only takes Tylenol.  Patient is alert awake oriented cooperative but somewhat blunted response per family at bedside.  Patient has had 4 falls in the last few weeks and has been having balance issues per report.  Upon arrival stat imaging of the head showed punctate area of hemorrhage in the left caudate nucleus otherwise unremarkable head CT, maxillofacial CT. Initial EKG was sinus rhythm with normal intervals. Neurology was consulted by ED provider and per recommendations of repeat CT for 6 hours was done to identify expansion or extension of bleed. CT angio of the head at 8 PM after repeat head CT noncontrast, an MRI of the brain, SCDs for DVT.  Recommended by neurology. CT angio redemonstrated a small area of acute hemorrhage along the left lateral ventricle which is minimally increased with no additional acute intracranial process, no large vessel occlusion.    Review of Systems: Review of Systems  Unable to perform ROS: Acuity of condition  Neurological:        Speech is thick.    Past Medical History:  Diagnosis Date   Abnormal uterine bleeding (AUB)    a. benign endometrial polyps on D+C in 03/2017   Anxiety    Arthritis    Carpal tunnel syndrome of left wrist     Chicken pox    Depression    GERD (gastroesophageal reflux disease)    History of stress test    a. 08/2017 Lexiscan MV: EF 55-65%. No ischemia/infarct. Low risk.   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 Dx in setting of dizziness/vertigo; b. 02/2018 Carotid U/S: bilateral intimal thickening and atherosclerotic plaque. No hemodynamically significant stenoses; c. 03/2018 MRI Brain: No mass or abnl enhancement. Chronic microvascular ischemic changes and mod volume loss of the brain. 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   Spinal stenosis of lumbar region with radiculopathy 2019   Stroke ([place])    PT STATES SHE WAS TOLD THIS BY HER PCP BUT PT STATES SHE NEVER KNEW SHE HAD A STROKE-NO RESIDUAL EFFECTS   Vertigo    Past Surgical History:  Procedure Laterality Date   BACK SURGERY  11/19/2017   lumbar laminectomy   CARPAL TUNNEL RELEASE Right 07/30/2019   CARPAL TUNNEL RELEASE Left 02/21/2021   Procedure: CARPAL TUNNEL RELEASE;  Surgeon: Dereck Leep, MD;  Location: [facility] ORS;  Service: Orthopedics;  Laterality: Left;   COLONOSCOPY     2003, 2011, 2021   COLONOSCOPY WITH PROPOFOL N/A 12/24/2019   Procedure: COLONOSCOPY WITH PROPOFOL;  Surgeon: Toledo, [name], MD;  Location: [facility] END[facility];  Service: Gastroenterology;  Laterality: N/A;   CYSTOSCOPY MACROPLASTIQUE IMPLANT N/A 05/15/2019   Procedure: CYSTOSCOPY MACROPLASTIQUE IMPLANT;  Surgeon: Maryan Puls  R, MD;  Location: [facility] ORS;  Service: Urology;  Laterality: N/A;   DILATATION & CURETTAGE/HYSTEROSCOPY WITH MYOSURE N/A 03/30/2017   Procedure: DILATATION & CURETTAGE/HYSTEROSCOPY WITH MYOSURE;  Surgeon: Schermerhorn, Gwen Her, MD;  Location: [facility] ORS;  Service: Gynecology;  Laterality: N/A;   EYE SURGERY Bilateral    Cataract Extraction with IOL   JOINT REPLACEMENT     KNEE ARTHROPLASTY Left 12/01/2020   Procedure: COMPUTER ASSISTED TOTAL KNEE ARTHROPLASTY;  Surgeon: Dereck Leep, MD;   Location: [facility] ORS;  Service: Orthopedics;  Laterality: Left;   SKIN LESION EXCISION Right    synovial cyst right AC joint   TONSILLECTOMY AND ADENOIDECTOMY     Age 89   Social History:  reports that she quit smoking about 45 years ago. Her smoking use included cigarettes. She has a 7.50 pack-year smoking history. She has never used smokeless tobacco. She reports current alcohol use of about 1.0 standard drink per week. She reports that she does not use drugs.  Allergies  Allergen Reactions   Ace Inhibitors Other (See Comments)    Angioedema   Pravastatin Other (See Comments)    Other reaction(s): Muscle Pain   Simvastatin Other (See Comments)    stiffness    Family History  Problem Relation Age of Onset   Rheum arthritis Mother    Ovarian cancer Sister    COPD Sister    CAD Sister    Heart disease Father    Aneurysm Father    Cancer Brother    Thrombosis Maternal Grandmother        a. DVT   Thrombosis Maternal Grandfather        a. DVT   Thrombosis Paternal Grandmother        a. DVT   Thrombosis Paternal Grandfather        a. DVT    Prior to Admission medications   Medication Sig Start Date End Date Taking? Authorizing Provider  acetaminophen (TYLENOL) 650 MG CR tablet Take 1,300 mg by mouth every 8 (eight) hours as needed for pain.   Yes [provider]  aspirin EC 81 MG tablet Take 81 mg by mouth daily. Swallow whole.   Yes [provider]  buPROPion (WELLBUTRIN XL) 300 MG 24 hr tablet Take 1 tablet (300 mg total) by mouth daily. 10/31/21  Yes Leone Haven, MD  celecoxib (CELEBREX) 200 MG capsule Take 200 mg by mouth 2 (two) times daily. 07/14/20  Yes [provider]  cetirizine (ZYRTEC) 10 MG tablet Take 10 mg by mouth daily.   Yes [provider]  fluticasone (FLONASE) 50 MCG/ACT nasal spray Place 1 spray into both nostrils daily. 02/21/21  Yes Hooten, Laurice Record, MD  gabapentin (NEURONTIN) 100 MG capsule Take 1 capsule (100 mg  total) by mouth at bedtime. 02/21/21  Yes Hooten, Laurice Record, MD  hydrochlorothiazide (HYDRODIURIL) 25 MG tablet TAKE 1 TABLET BY MOUTH DAILY 09/19/21  Yes Leone Haven, MD  meclizine (ANTIVERT) 12.5 MG tablet Take 1 tablet (12.5 mg total) by mouth 3 (three) times daily as needed for dizziness. 12/22/21  Yes Merlyn Lot, MD  Melatonin 10 MG TABS Take 10 mg by mouth at bedtime.   Yes [provider]  omeprazole (PRILOSEC) 20 MG capsule TAKE 1 CAPSULE EVERY DAY 10/05/21  Yes Leone Haven, MD  OVER THE COUNTER MEDICATION Take 2 tablets by mouth daily. Glucocil   Yes [provider]  Polyethyl Glycol-Propyl Glycol (SYSTANE) 0.4-0.3 % SOLN Place 1 drop  into both eyes 3 (three) times daily as needed (dry/irritated eyes.).   Yes [provider]  predniSONE (DELTASONE) 20 MG tablet Take 2 tablets (40 mg total) by mouth daily with breakfast. 03/31/21  Yes Leone Haven, MD  rosuvastatin (CRESTOR) 40 MG tablet TAKE 1 TABLET BY MOUTH ONCE DAILY 08/04/21  Yes Leone Haven, MD  traMADol (ULTRAM) 50 MG tablet Take 25-50 mg by mouth 2 (two) times daily as needed. 11/24/21  Yes [provider]  venlafaxine XR (EFFEXOR-XR) 150 MG 24 hr capsule TAKE 1 CAPSULE BY MOUTH EVERY DAY 12/21/21  Yes Leone Haven, MD  EPINEPHrine (EPIPEN 2-PAK) 0.3 mg/0.3 mL IJ SOAJ injection Inject 0.3 mLs (0.3 mg total) into the muscle as needed for anaphylaxis. 02/20/19   Harvest Dark, MD    Physical Exam: Vitals:   12/28/21 1930 12/28/21 2230 12/29/21 0100 12/29/21 0130  BP: (!) 148/83 (!) 152/84 (!) 166/91 128/82  Pulse: 96 88 98 95  Resp: 19 17  18   Temp:      TempSrc:      SpO2: 99% 99% 98% 96%   Physical Exam Vitals and nursing note reviewed.  Constitutional:      General: She is not in acute distress.    Appearance: She is not ill-appearing, toxic-appearing or diaphoretic.  HENT:     Head: Raccoon eyes, abrasion and left periorbital erythema present.      Right Ear: Hearing and external ear normal.     Left Ear: Hearing and external ear normal.     Nose: Nose normal. No nasal deformity.     Mouth/Throat:     Lips: Pink.     Mouth: Mucous membranes are moist.     Tongue: No lesions. Tongue does not deviate from midline.  Eyes:     General: Lids are normal.     Extraocular Movements: Extraocular movements intact.     Right eye: No nystagmus.     Left eye: No nystagmus.     Pupils: Pupils are equal, round, and reactive to light.     Comments: Pupils 5 mm bl.   Neck:     Thyroid: No thyroid mass.     Vascular: No carotid bruit.  Cardiovascular:     Rate and Rhythm: Normal rate and regular rhythm.     Pulses: Normal pulses.          Dorsalis pedis pulses are 2+ on the right side and 2+ on the left side.       Posterior tibial pulses are 2+ on the right side and 2+ on the left side.     Heart sounds: Normal heart sounds.  Pulmonary:     Effort: Pulmonary effort is normal.     Breath sounds: Normal breath sounds.  Abdominal:     General: Bowel sounds are normal. There is no distension.     Palpations: Abdomen is soft. There is no mass.     Tenderness: There is no abdominal tenderness. There is no guarding.     Hernia: No hernia is present.  Musculoskeletal:     Right lower leg: No edema.     Left lower leg: No edema.  Skin:    General: Skin is warm.  Neurological:     General: No focal deficit present.     Mental Status: She is alert and oriented to person, place, and time.     GCS: GCS eye subscore is 4. GCS verbal subscore is 5. GCS motor  subscore is 6.     Cranial Nerves: Dysarthria present.     Motor: Motor function is intact. No weakness, tremor, atrophy, abnormal muscle tone, seizure activity or pronator drift.     Coordination: Heel to [facility] Test abnormal. Finger-Nose-Finger Test normal.     Deep Tendon Reflexes:     Reflex Scores:      Bicep reflexes are 2+ on the right side and 2+ on the left side.      Patellar reflexes  are 2+ on the right side and 2+ on the left side.    Comments: Pt is not able to do heel shin because she has has had knee sx in past, suspect underlying arthritis.    Psychiatric:        Attention and Perception: Attention normal.        Mood and Affect: Mood normal.        Speech: Speech is slurred.        Behavior: Behavior normal. Behavior is cooperative.        Cognition and Memory: Cognition normal.    Data Reviewed: Results for orders placed or performed during the hospital encounter of 12/28/21 (from the past 24 hour(s))  Basic metabolic panel     Status: Abnormal   Collection Time: 12/28/21  3:22 PM  Result Value Ref Range   Sodium 137 135 - 145 mmol/L   Potassium 4.5 3.5 - 5.1 mmol/L   Chloride 99 98 - 111 mmol/L   CO2 28 22 - 32 mmol/L   Glucose, Bld 103 (H) 70 - 99 mg/dL   BUN 14 8 - 23 mg/dL   Creatinine, Ser 0.95 0.44 - 1.00 mg/dL   Calcium 9.4 8.9 - 10.3 mg/dL   GFR, Estimated >60 >60 mL/min   Anion gap 10 5 - 15  CBC with Differential     Status: None   Collection Time: 12/28/21  3:22 PM  Result Value Ref Range   WBC 9.3 4.0 - 10.5 K/uL   RBC 4.84 3.87 - 5.11 MIL/uL   Hemoglobin 14.1 12.0 - 15.0 g/dL   HCT 45.2 36.0 - 46.0 %   MCV 93.4 80.0 - 100.0 fL   MCH 29.1 26.0 - 34.0 pg   MCHC 31.2 30.0 - 36.0 g/dL   RDW 13.8 11.5 - 15.5 %   Platelets 304 150 - 400 K/uL   nRBC 0.0 0.0 - 0.2 %   Neutrophils Relative % 64 %   Neutro Abs 6.1 1.7 - 7.7 K/uL   Lymphocytes Relative 23 %   Lymphs Abs 2.1 0.7 - 4.0 K/uL   Monocytes Relative 8 %   Monocytes Absolute 0.7 0.1 - 1.0 K/uL   Eosinophils Relative 3 %   Eosinophils Absolute 0.2 0.0 - 0.5 K/uL   Basophils Relative 1 %   Basophils Absolute 0.1 0.0 - 0.1 K/uL   Immature Granulocytes 1 %   Abs Immature Granulocytes 0.05 0.00 - 0.07 K/uL  Protime-INR     Status: None   Collection Time: 12/28/21  3:22 PM  Result Value Ref Range   Prothrombin Time 13.4 11.4 - 15.2 seconds   INR 1.0 0.8 - 1.2  APTT     Status:  None   Collection Time: 12/28/21  3:22 PM  Result Value Ref Range   aPTT 31 24 - 36 seconds    Assessment and Plan: * ICH (intracerebral hemorrhage) (HCC) Repeat ct imaging shows: No acute infarct, mass, mass effect, or midline shift. Redemonstrated small  area of acute hemorrhage along the lateral margin of the left lateral ventricle at the level of the corona radiata, which now measures up to 11 x 6 x 9 mm, previously 10 x 5 x 7 mm, minimally increased in size. No hydrocephalus or extra-axial Collection. We will admit to stepdown. Neuro monitoring.  Pt's ICH score is 1. HOLD ALL ANTIPLATELETS.  keppra iv to prevent seizures.  Aspiration/ fall / seizure precaution.  Neurology following.    Essential hypertension Blood pressure 128/82, pulse 95, temperature 98.6 F (37 C), temperature source Oral, resp. rate 18, SpO2 96 %.  Goal bp is 140's. Home meds continued and PRN labetalol per Neuro recommendation.    GERD (gastroesophageal reflux disease) Iv ppi.      Advance Care Planning:    Code Status: Full Code   Consults:  Neurology    Family Communication:  Bartmess,Floyd (Spouse)  [phone] (Mobile)  Severity of Illness: The appropriate patient status for this patient is INPATIENT. Inpatient status is judged to be reasonable and necessary in order to provide the required intensity of service to ensure the patient's safety. The patient's presenting symptoms, physical exam findings, and initial radiographic and laboratory data in the context of their chronic comorbidities is felt to place them at high risk for further clinical deterioration. Furthermore, it is not anticipated that the patient will be medically stable for discharge from the hospital within 2 midnights of admission.   * I certify that at the point of admission it is my clinical judgment that the patient will require inpatient hospital care spanning beyond 2 midnights from the point of admission due to  high intensity of service, high risk for further deterioration and high frequency of surveillance required.*  Author: Para Skeans, MD 12/29/2021 1:38 AM  For on call review [email or web address].

## 2021-12-28 NOTE — Plan of Care (Signed)
Neurology plan of care  Contacted by Dr. Scotty Court regarding this patient with hx HTN, HL, multiple recent mechanical falls who presented today after another mechanical fall. Per EDP, neuro intact and fully oriented. CT head showed 1cm acute IPH L caudate, sequelae of trauma vs hemorrhagic infarct.  Recommendations: - Repeat head CT @ 6 hrs to rule out expansion. If IPH is expanding she will need transfer to [facility] (under neurology service - please contact overnight neurohospitalist if this is the case). If stable and patient remains asymptomatic, OK to admit to [facility] obs. - CTA head at 8pm after repeat head CT noncon - MRI brain r/o underlying ischemic infarct w/ hemorrhagic conversion - Patient is not on anticoagulation - nothing to reverse - Hold antiplatelets - SCDs for DVT prophylaxis, hold pharmacologic DVT prophylaxis - Elevate HOB 30 degrees - Goal SBP <150, use prn IV labetalol or clevidipine gtt if needed - I will evaluate patient in AM if she is admitted to [facility], MD [facility] [phone]  If 7pm- 7am, please page neurology on call as listed in AMION.

## 2021-12-29 DIAGNOSIS — Y92017 Garden or yard in single-family (private) house as the place of occurrence of the external cause: Secondary | ICD-10-CM | POA: Diagnosis not present

## 2021-12-29 DIAGNOSIS — Z825 Family history of asthma and other chronic lower respiratory diseases: Secondary | ICD-10-CM | POA: Diagnosis not present

## 2021-12-29 DIAGNOSIS — S00212A Abrasion of left eyelid and periocular area, initial encounter: Secondary | ICD-10-CM | POA: Diagnosis present

## 2021-12-29 DIAGNOSIS — Z8261 Family history of arthritis: Secondary | ICD-10-CM | POA: Diagnosis not present

## 2021-12-29 DIAGNOSIS — S06350A Traumatic hemorrhage of left cerebrum without loss of consciousness, initial encounter: Secondary | ICD-10-CM | POA: Diagnosis present

## 2021-12-29 DIAGNOSIS — W010XXA Fall on same level from slipping, tripping and stumbling without subsequent striking against object, initial encounter: Secondary | ICD-10-CM | POA: Diagnosis present

## 2021-12-29 DIAGNOSIS — I619 Nontraumatic intracerebral hemorrhage, unspecified: Secondary | ICD-10-CM

## 2021-12-29 DIAGNOSIS — Z8249 Family history of ischemic heart disease and other diseases of the circulatory system: Secondary | ICD-10-CM | POA: Diagnosis not present

## 2021-12-29 DIAGNOSIS — S022XXA Fracture of nasal bones, initial encounter for closed fracture: Secondary | ICD-10-CM | POA: Diagnosis present

## 2021-12-29 DIAGNOSIS — E785 Hyperlipidemia, unspecified: Secondary | ICD-10-CM | POA: Diagnosis present

## 2021-12-29 DIAGNOSIS — M48061 Spinal stenosis, lumbar region without neurogenic claudication: Secondary | ICD-10-CM | POA: Diagnosis present

## 2021-12-29 DIAGNOSIS — Z7952 Long term (current) use of systemic steroids: Secondary | ICD-10-CM | POA: Diagnosis not present

## 2021-12-29 DIAGNOSIS — F32A Depression, unspecified: Secondary | ICD-10-CM | POA: Diagnosis present

## 2021-12-29 DIAGNOSIS — R296 Repeated falls: Secondary | ICD-10-CM

## 2021-12-29 DIAGNOSIS — E538 Deficiency of other specified B group vitamins: Secondary | ICD-10-CM

## 2021-12-29 DIAGNOSIS — I10 Essential (primary) hypertension: Secondary | ICD-10-CM | POA: Diagnosis present

## 2021-12-29 DIAGNOSIS — Z8673 Personal history of transient ischemic attack (TIA), and cerebral infarction without residual deficits: Secondary | ICD-10-CM | POA: Diagnosis not present

## 2021-12-29 DIAGNOSIS — Z888 Allergy status to other drugs, medicaments and biological substances status: Secondary | ICD-10-CM | POA: Diagnosis not present

## 2021-12-29 DIAGNOSIS — Y9301 Activity, walking, marching and hiking: Secondary | ICD-10-CM | POA: Diagnosis present

## 2021-12-29 DIAGNOSIS — Z23 Encounter for immunization: Secondary | ICD-10-CM | POA: Diagnosis present

## 2021-12-29 DIAGNOSIS — Z7982 Long term (current) use of aspirin: Secondary | ICD-10-CM | POA: Diagnosis not present

## 2021-12-29 DIAGNOSIS — K219 Gastro-esophageal reflux disease without esophagitis: Secondary | ICD-10-CM | POA: Diagnosis present

## 2021-12-29 DIAGNOSIS — F419 Anxiety disorder, unspecified: Secondary | ICD-10-CM | POA: Diagnosis present

## 2021-12-29 DIAGNOSIS — Z791 Long term (current) use of non-steroidal anti-inflammatories (NSAID): Secondary | ICD-10-CM | POA: Diagnosis not present

## 2021-12-29 DIAGNOSIS — Z8041 Family history of malignant neoplasm of ovary: Secondary | ICD-10-CM | POA: Diagnosis not present

## 2021-12-29 DIAGNOSIS — Z87891 Personal history of nicotine dependence: Secondary | ICD-10-CM | POA: Diagnosis not present

## 2021-12-29 LAB — MRSA NEXT GEN BY PCR, NASAL: MRSA by PCR Next Gen: NOT DETECTED

## 2021-12-29 LAB — TYPE AND SCREEN
ABO/RH(D): O POS
Antibody Screen: NEGATIVE

## 2021-12-29 LAB — GLUCOSE, CAPILLARY: Glucose-Capillary: 97 mg/dL (ref 70–99)

## 2021-12-29 LAB — VITAMIN B12: Vitamin B-12: 190 pg/mL (ref 180–914)

## 2021-12-29 MED ORDER — CYANOCOBALAMIN 1000 MCG/ML IJ SOLN
1000.0000 ug | Freq: Once | INTRAMUSCULAR | Status: AC
  Administered 2021-12-29: 1000 ug via INTRAMUSCULAR
  Filled 2021-12-29: qty 1

## 2021-12-29 MED ORDER — ROSUVASTATIN CALCIUM 20 MG PO TABS
40.0000 mg | ORAL_TABLET | Freq: Every day | ORAL | Status: DC
  Administered 2021-12-29 – 2021-12-30 (×2): 40 mg via ORAL
  Filled 2021-12-29 (×2): qty 2

## 2021-12-29 MED ORDER — ACETAMINOPHEN 650 MG RE SUPP: 650.0000 mg | RECTAL | Status: DC | PRN

## 2021-12-29 MED ORDER — STROKE: EARLY STAGES OF RECOVERY BOOK: Freq: Once | Status: DC

## 2021-12-29 MED ORDER — BUPROPION HCL ER (XL) 150 MG PO TB24
300.0000 mg | ORAL_TABLET | Freq: Every day | ORAL | Status: DC
  Administered 2021-12-29 – 2021-12-30 (×2): 300 mg via ORAL
  Filled 2021-12-29: qty 1
  Filled 2021-12-29: qty 2

## 2021-12-29 MED ORDER — LABETALOL HCL 5 MG/ML IV SOLN: 5.0000 mg | INTRAVENOUS | Status: DC | PRN

## 2021-12-29 MED ORDER — HYDROCHLOROTHIAZIDE 25 MG PO TABS
25.0000 mg | ORAL_TABLET | Freq: Every day | ORAL | Status: DC
  Administered 2021-12-29 – 2021-12-30 (×2): 25 mg via ORAL
  Filled 2021-12-29 (×2): qty 1

## 2021-12-29 MED ORDER — LEVETIRACETAM IN NACL 500 MG/100ML IV SOLN
500.0000 mg | Freq: Two times a day (BID) | INTRAVENOUS | Status: DC
  Administered 2021-12-29: 500 mg via INTRAVENOUS
  Filled 2021-12-29 (×2): qty 100

## 2021-12-29 MED ORDER — PANTOPRAZOLE SODIUM 40 MG PO TBEC
40.0000 mg | DELAYED_RELEASE_TABLET | Freq: Every day | ORAL | Status: DC
  Administered 2021-12-29: 40 mg via ORAL
  Filled 2021-12-29: qty 1

## 2021-12-29 MED ORDER — ACETAMINOPHEN 160 MG/5ML PO SOLN: 650.0000 mg | ORAL | Status: DC | PRN

## 2021-12-29 MED ORDER — CHLORHEXIDINE GLUCONATE CLOTH 2 % EX PADS
6.0000 | MEDICATED_PAD | Freq: Every day | CUTANEOUS | Status: DC
  Administered 2021-12-29: 6 via TOPICAL

## 2021-12-29 MED ORDER — CELECOXIB 200 MG PO CAPS: 200.0000 mg | ORAL_CAPSULE | Freq: Two times a day (BID) | ORAL | Status: DC

## 2021-12-29 MED ORDER — PANTOPRAZOLE SODIUM 40 MG IV SOLR
40.0000 mg | Freq: Every day | INTRAVENOUS | Status: DC
  Administered 2021-12-29: 40 mg via INTRAVENOUS
  Filled 2021-12-29: qty 10

## 2021-12-29 MED ORDER — ACETAMINOPHEN 325 MG PO TABS
650.0000 mg | ORAL_TABLET | ORAL | Status: DC | PRN
  Administered 2021-12-30: 650 mg via ORAL
  Filled 2021-12-29: qty 2

## 2021-12-29 MED ORDER — GABAPENTIN 100 MG PO CAPS: 100.0000 mg | ORAL_CAPSULE | Freq: Every day | ORAL | Status: DC

## 2021-12-29 MED ORDER — VENLAFAXINE HCL ER 75 MG PO CP24
150.0000 mg | ORAL_CAPSULE | Freq: Every day | ORAL | Status: DC
  Administered 2021-12-29 – 2021-12-30 (×2): 150 mg via ORAL
  Filled 2021-12-29: qty 2
  Filled 2021-12-29: qty 1

## 2021-12-29 NOTE — Assessment & Plan Note (Addendum)
--  1cm acute IPH L caudate, traumatic from the fall.  MRI brain ruled out stroke. --repeat brain imaging stable --neuro to see today

## 2021-12-29 NOTE — Progress Notes (Signed)
SLP Cancellation Note  Patient Details Name: GIULIANA HANDYSIDE MRN: [id number] DOB: 23-Aug-1940   Cancelled treatment:       Reason Eval/Treat Not Completed: Patient at procedure or test/unavailable (pt w/ PT in room. Will attempt at a later date.)      Jerilynn Som, MS, CCC-SLP Speech Language Pathologist Rehab Services; [facility] [phone] (ascom) Matthan Sledge 12/29/2021, 2:48 PM

## 2021-12-29 NOTE — Assessment & Plan Note (Signed)
--  pt declined SNF rehab.  Daughter is willing to provide 24/7 supervision and hire help --home with [facility]

## 2021-12-29 NOTE — Progress Notes (Signed)
SLP Cancellation Note  Patient Details Name: REISHA TIEGS MRN: [id number] DOB: Nov 04, 1940   Cancelled treatment:       Reason Eval/Treat Not Completed: Patient at procedure or test/unavailable (pt was just transferred to new room; NSG present in room completing assessment. ST services will f/u tomorrow w/ assessment.)     Orinda Kenner, MS, CCC-SLP Speech Language Pathologist Rehab Services; [place] [phone] (ascom) Jerimy Johanson 12/29/2021, 3:32 PM

## 2021-12-29 NOTE — Assessment & Plan Note (Signed)
--  level 190, on the low side.  May contribute to pt's balance issues. --IM vit B12 x1 today

## 2021-12-29 NOTE — Assessment & Plan Note (Addendum)
--  cont HCTZ

## 2021-12-29 NOTE — Consult Note (Addendum)
NEUROLOGY CONSULTATION NOTE   Date of service: Dec 29, 2021 Patient Name: Ashley [name] MRN:  [id number] DOB:  1940-09-22 Reason for consult: traumatic IPH Requesting physician: Dr. Enzo Bi _ _ _   _ __   _ __ _ _  __ __   _ __   __ _  History of Present Illness   This is a 81 yo woman with hx HTN, remote stroke 2019 without significant residual deficits admitted after mechanical fall yesterday resulting in small IPH L caudate. She has had multiple mechanical falls and progressive gait instability over past 4 weeks per family. They also report some concern for subacute cognitive decline. She lives at home alone with her husband who has moderate dementia 2/2 Parkinsons. CT head on admission showed small acute traumatic IPH L caudate 10x6x16mm. This was stable on repeat head CT 6 hrs later. MRI brain showed no e/o underlying ischemic infarct.   CTA H&N - no vascular malformation or aneurysm  CNS imaging personally reviewed  CT maxillofacial showed bilateral nasal bone fractures   ROS   Per HPI: all other systems reviewed and are negative  Past History   I have reviewed the following:  Past Medical History:  Diagnosis Date   Abnormal uterine bleeding (AUB)    a. benign endometrial polyps on D+C in 03/2017   Anxiety    Arthritis    Carpal tunnel syndrome of left wrist    Chicken pox    Depression    GERD (gastroesophageal reflux disease)    History of stress test    a. 08/2017 Lexiscan MV: EF 55-65%. No ischemia/infarct. Low risk.   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 Dx in setting of dizziness/vertigo; b. 02/2018 Carotid U/S: bilateral intimal thickening and atherosclerotic plaque. No hemodynamically significant stenoses; c. 03/2018 MRI Brain: No mass or abnl enhancement. Chronic microvascular ischemic changes and mod volume loss of the brain. 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   Spinal stenosis of lumbar region with  radiculopathy 2019   Stroke ([place])    PT STATES SHE WAS TOLD THIS BY HER PCP BUT PT STATES SHE NEVER KNEW SHE HAD A STROKE-NO RESIDUAL EFFECTS   Vertigo    Past Surgical History:  Procedure Laterality Date   BACK SURGERY  11/19/2017   lumbar laminectomy   CARPAL TUNNEL RELEASE Right 07/30/2019   CARPAL TUNNEL RELEASE Left 02/21/2021   Procedure: CARPAL TUNNEL RELEASE;  Surgeon: Dereck Leep, MD;  Location: [facility];  Service: Orthopedics;  Laterality: Left;   COLONOSCOPY     2003, 2011, 2021   COLONOSCOPY WITH PROPOFOL N/A 12/24/2019   Procedure: COLONOSCOPY WITH PROPOFOL;  Surgeon: Toledo, [name], MD;  Location: [facility];  Service: Gastroenterology;  Laterality: N/A;   CYSTOSCOPY MACROPLASTIQUE IMPLANT N/A 05/15/2019   Procedure: CYSTOSCOPY MACROPLASTIQUE IMPLANT;  Surgeon: Royston Cowper, MD;  Location: [facility];  Service: Urology;  Laterality: N/A;   DILATATION & CURETTAGE/HYSTEROSCOPY WITH MYOSURE N/A 03/30/2017   Procedure: DILATATION & CURETTAGE/HYSTEROSCOPY WITH MYOSURE;  Surgeon: Schermerhorn, Gwen Her, MD;  Location: [facility];  Service: Gynecology;  Laterality: N/A;   EYE SURGERY Bilateral    Cataract Extraction with IOL   JOINT REPLACEMENT     KNEE ARTHROPLASTY Left 12/01/2020   Procedure: COMPUTER ASSISTED TOTAL KNEE ARTHROPLASTY;  Surgeon: Dereck Leep, MD;  Location: [facility];  Service: Orthopedics;  Laterality: Left;   SKIN LESION EXCISION  Right    synovial cyst right AC joint   TONSILLECTOMY AND ADENOIDECTOMY     Age 81   Family History  Problem Relation Age of Onset   Rheum arthritis Mother    Ovarian cancer Sister    COPD Sister    CAD Sister    Heart disease Father    Aneurysm Father    Cancer Brother    Thrombosis Maternal Grandmother        a. DVT   Thrombosis Maternal Grandfather        a. DVT   Thrombosis Paternal Grandmother        a. DVT   Thrombosis Paternal Grandfather        a. DVT   Social History   Socioeconomic History    Marital status: Married    Spouse name: [name]   Number of children: 1   Years of education: Not on file   Highest education level: Not on file  Occupational History   Not on file  Tobacco Use   Smoking status: Former    Packs/day: 0.50    Years: 15.00    Pack years: 7.50    Types: Cigarettes    Quit date: 08/07/1976    Years since quitting: 45.4   Smokeless tobacco: Never  Vaping Use   Vaping Use: Never used  Substance and Sexual Activity   Alcohol use: Yes    Alcohol/week: 1.0 standard drink    Types: 1 Glasses of wine per week    Comment: ocassionally   Drug use: No   Sexual activity: Not on file  Other Topics Concern   Not on file  Social History Narrative   Married    Social Determinants of Health   Financial Resource Strain: Not on file  Food Insecurity: Not on file  Transportation Needs: Not on file  Physical Activity: Not on file  Stress: Not on file  Social Connections: Not on file   Allergies  Allergen Reactions   Ace Inhibitors Other (See Comments)    Angioedema   Pravastatin Other (See Comments)    Other reaction(s): Muscle Pain   Simvastatin Other (See Comments)    stiffness    Medications   Medications Prior to Admission  Medication Sig Dispense Refill Last Dose   acetaminophen (TYLENOL) 650 MG CR tablet Take 1,300 mg by mouth every 8 (eight) hours as needed for pain.   prn at prn   aspirin EC 81 MG tablet Take 81 mg by mouth daily. Swallow whole.   12/27/2021   buPROPion (WELLBUTRIN XL) 300 MG 24 hr tablet Take 1 tablet (300 mg total) by mouth daily. 90 tablet 1 12/27/2021   celecoxib (CELEBREX) 200 MG capsule Take 200 mg by mouth 2 (two) times daily.   12/27/2021   cetirizine (ZYRTEC) 10 MG tablet Take 10 mg by mouth daily.   12/27/2021   fluticasone (FLONASE) 50 MCG/ACT nasal spray Place 1 spray into both nostrils daily.   12/27/2021   gabapentin (NEURONTIN) 100 MG capsule Take 1 capsule (100 mg total) by mouth at bedtime. 90 capsule 1 12/27/2021    hydrochlorothiazide (HYDRODIURIL) 25 MG tablet TAKE 1 TABLET BY MOUTH DAILY 90 tablet 1 12/27/2021   meclizine (ANTIVERT) 12.5 MG tablet Take 1 tablet (12.5 mg total) by mouth 3 (three) times daily as needed for dizziness. 10 tablet 0 prn at prn   Melatonin 10 MG TABS Take 10 mg by mouth at bedtime.   12/27/2021   omeprazole (PRILOSEC) 20  MG capsule TAKE 1 CAPSULE EVERY DAY 30 capsule 5 12/27/2021   OVER THE COUNTER MEDICATION Take 2 tablets by mouth daily. Glucocil   12/27/2021   Polyethyl Glycol-Propyl Glycol (SYSTANE) 0.4-0.3 % SOLN Place 1 drop into both eyes 3 (three) times daily as needed (dry/irritated eyes.).   12/27/2021   predniSONE (DELTASONE) 20 MG tablet Take 2 tablets (40 mg total) by mouth daily with breakfast. 10 tablet 0 12/27/2021   rosuvastatin (CRESTOR) 40 MG tablet TAKE 1 TABLET BY MOUTH ONCE DAILY 90 tablet 1 12/27/2021   traMADol (ULTRAM) 50 MG tablet Take 25-50 mg by mouth 2 (two) times daily as needed.   12/27/2021   venlafaxine XR (EFFEXOR-XR) 150 MG 24 hr capsule TAKE 1 CAPSULE BY MOUTH EVERY DAY 90 capsule 3 12/27/2021   EPINEPHrine (EPIPEN 2-PAK) 0.3 mg/0.3 mL IJ SOAJ injection Inject 0.3 mLs (0.3 mg total) into the muscle as needed for anaphylaxis. 1 each 1 prn at prn      Current Facility-Administered Medications:     stroke: early stages of recovery book, , Does not apply, Once, Para Skeans, MD   acetaminophen (TYLENOL) tablet 650 mg, 650 mg, Oral, Q4H PRN **OR** acetaminophen (TYLENOL) 160 MG/5ML solution 650 mg, 650 mg, Per Tube, Q4H PRN **OR** acetaminophen (TYLENOL) suppository 650 mg, 650 mg, Rectal, Q4H PRN, Para Skeans, MD   buPROPion (WELLBUTRIN XL) 24 hr tablet 300 mg, 300 mg, Oral, Daily, Florina Ou V, MD, 300 mg at 12/29/21 1011   Chlorhexidine Gluconate Cloth 2 % PADS 6 each, 6 each, Topical, Daily, Para Skeans, MD, 6 each at 12/29/21 1009   hydrochlorothiazide (HYDRODIURIL) tablet 25 mg, 25 mg, Oral, Daily, Florina Ou V, MD, 25 mg at 12/29/21 1010    labetalol (NORMODYNE) injection 5 mg, 5 mg, Intravenous, Q2H PRN, Para Skeans, MD   pantoprazole (PROTONIX) EC tablet 40 mg, 40 mg, Oral, QHS, Chappell, Alex B, RPH   rosuvastatin (CRESTOR) tablet 40 mg, 40 mg, Oral, Daily, Florina Ou V, MD, 40 mg at 12/29/21 1010   venlafaxine XR (EFFEXOR-XR) 24 hr capsule 150 mg, 150 mg, Oral, Daily, Florina Ou V, MD, 150 mg at 12/29/21 1011  Vitals   Vitals:   12/29/21 1200 12/29/21 1300 12/29/21 1400 12/29/21 1530  BP: 132/72 137/71 130/86 131/81  Pulse: 95 94 88 92  Resp: 20 18 (!) 25 16  Temp: 98 F (36.7 C)   98.5 F (36.9 C)  TempSrc: Oral     SpO2: 92% 92% 97% 95%  Weight:      Height:         Body mass index is 29.7 kg/m.  Physical Exam   Physical Exam Gen: A&O x4, NAD HEENT: Extensive bruising around bilateral eyes, cheeks, nose Neck: Supple, trachea midline. Resp: CTAB, no w/r/r CV: RRR, no m/g/r; nml S1 and S2. 2+ symmetric peripheral pulses. Abd: soft/NT/ND; nabs x 4 quad Extrem: Nml bulk; no cyanosis, clubbing, or edema.  Neuro: *MS: A&O x4. Follows multi-step commands.  *Speech: fluid, nondysarthric, able to name and repeat *CN:    I: Deferred   II,III: PERRLA, VFF by confrontation, optic discs unable to be visualized 2/2 pupillary constriction   III,IV,VI: EOMI w/o nystagmus, no ptosis   V: Sensation intact from V1 to V3 to LT   VII: Eyelid closure was full.  Smile symmetric.   VIII: Hearing intact to voice   IX,X: Voice normal, palate elevates symmetrically    XI: SCM/trap 5/5 bilat  XII: Tongue protrudes midline, no atrophy or fasciculations   *Motor:   Normal bulk.  No tremor, rigidity or bradykinesia. No pronator drift.    Strength: Dlt Bic Tri WrE WrF FgS Gr HF KnF KnE PlF DoF    Left 5 5 5 5 5 5 5 5 5 5 5 5     Right 5 5 5 5 5 5 5 5 5 5 5 5     *Sensory: Intact to light touch, pinprick, temperature vibration throughout. Symmetric. Propioception intact bilat.  No double-simultaneous extinction.   *Coordination:  Finger-to-nose, heel-to-shin, rapid alternating motions were intact. *Reflexes:  2+ and symmetric throughout without clonus; toes down-going bilat *Gait: deferred  NIHSS = 0  ICH score 1  Premorbid mRS = 2   Labs   CBC:  Recent Labs  Lab 12/28/21 1522  WBC 9.3  NEUTROABS 6.1  HGB 14.1  HCT 45.2  MCV 93.4  PLT 123456    Basic Metabolic Panel:  Lab Results  Component Value Date   NA 137 12/28/2021   K 4.5 12/28/2021   CO2 28 12/28/2021   GLUCOSE 103 (H) 12/28/2021   BUN 14 12/28/2021   CREATININE 0.95 12/28/2021   CALCIUM 9.4 12/28/2021   GFRNONAA >60 12/28/2021   GFRAA >60 05/12/2019   Lipid Panel:  Lab Results  Component Value Date   LDLCALC 47 03/28/2021   HgbA1c:  Lab Results  Component Value Date   HGBA1C 6.3 03/28/2021   Urine Drug Screen:     Component Value Date/Time   LABOPIA NONE DETECTED 12/22/2021 1451   COCAINSCRNUR NONE DETECTED 12/22/2021 1451   LABBENZ NONE DETECTED 12/22/2021 1451   AMPHETMU NONE DETECTED 12/22/2021 1451   THCU NONE DETECTED 12/22/2021 1451   LABBARB NONE DETECTED 12/22/2021 1451    Alcohol Level No results found for: ETH    Impression   This is a 81 yo woman with hx HTN, remote stroke 2019 without significant residual deficits admitted after mechanical fall yesterday resulting in small IPH L caudate. She has had multiple mechanical falls and progressive gait instability over past 4 weeks per family. They also report some concern for subacute cognitive decline. She lives at home alone with her husband who has moderate dementia 2/2 Parkinsons. I am concerned about her safety in the current living situation and recommend PT eval and cognitive screen with SLP. Regarding the IPH, it appears to be traumatic. No e/o underlying infarct on MRI brain.   Recommendations   - Hold antiplatelets - SCDs for DVT prophylaxis, hold pharmacologic DVT prophylaxis - Elevate HOB 30 degrees - Goal SBP <150, use prn IV  labetalol or clevidipine gtt if needed - PT/OT/SLP - q4 hr neurochecks - OK to d/c keppra, no indication for prophylaxis for small IPH - Will continue to follow ______________________________________________________________________   Thank you for the opportunity to take part in the care of this patient. If you have any further questions, please contact the neurology consultation attending.  Signed,  Su Monks, MD [facility] [phone]  If 7pm- 7am, please page neurology on call as listed in [place].

## 2021-12-29 NOTE — Progress Notes (Signed)
SLP Cancellation Note  Patient Details Name: JOPLIN CANTY MRN: [id number] DOB: 1941/04/21   Cancelled treatment:       Reason Eval/Treat Not Completed:  (chart reviewed; MD canceled order. ST services available if new needs.)     Jerilynn Som, MS, CCC-SLP Speech Language Pathologist Rehab Services; [facility] [phone] (ascom) Giovannina Mun 12/29/2021, 12:21 PM

## 2021-12-29 NOTE — Assessment & Plan Note (Signed)
--  bilateral nasal bone fracture.  Suffered from fall.  Pt denied pain.

## 2021-12-29 NOTE — Progress Notes (Signed)
PHARMACIST - PHYSICIAN COMMUNICATION  CONCERNING: IV to Oral Route Change Policy  RECOMMENDATION: This patient is receiving pantoprazole by the intravenous route.  Based on criteria approved by the Pharmacy and Therapeutics Committee, the intravenous medication(s) is/are being converted to the equivalent oral dose form(s).   DESCRIPTION: These criteria include: The patient is eating (either orally or via tube) and/or has been taking other orally administered medications for a least 24 hours The patient has no evidence of active gastrointestinal bleeding or impaired GI absorption (gastrectomy, short bowel, patient on TNA or NPO).  If you have questions about this conversion, please contact the Pharmacy Department   Tressie Ellis, [facility] 12/29/2021 11:05 AM

## 2021-12-29 NOTE — Evaluation (Signed)
Physical Therapy Evaluation Patient Details Name: Ashley [name] MRN: [id number] DOB: Feb 04, 1941 Today's Date: 12/29/2021  History of Present Illness  81 y.o. female with medical history significant of anxiety, GERD, hypertension, history of stroke in 2019 presenting with a fall (while petting her cat) injuring her face.  She has multiple abrasions, bruises and swelling to her face particularly worse around the left eye and left cheekbone.  Imaging reveals small acute left lateral periventricular hemorrhage with no acute infarct.  Clinical Impression  Pt showed good effort t/o the session but did not appear to comprehend her deficits, the most striking being her balance.  She had multiple LOBs t/o the session, both in static standing and during ambulation.  She was able to catch herself a few times, but needed direct assist more than once to stay upright. Pt appears to have relatively functional and b/l equal strength but highly asymmetrical ambulation effort with leaning R (veering L) outside of walker with choppy, limping gait and poor ability to make cued adjustments.  Daughter present t/o session, appears able and willing to stay with pt 24/7 initially at discharge.  Long discussion about the need for an AD and CGA during mobility tasks.  Good overall effort with some overt safety issues.       Recommendations for follow up therapy are one component of a multi-disciplinary discharge planning process, led by the attending physician.  Recommendations may be updated based on patient status, additional functional criteria and insurance authorization.  Follow Up Recommendations Home health PT    Assistance Recommended at Discharge Frequent or constant Supervision/Assistance  Patient can return home with the following  A little help with walking and/or transfers;Assistance with cooking/housework;Assist for transportation;Help with stairs or ramp for entrance;A little help with  bathing/dressing/bathroom    Equipment Recommendations None recommended by PT  Recommendations for Other Services  OT consult    Functional Status Assessment Patient has had a recent decline in their functional status and demonstrates the ability to make significant improvements in function in a reasonable and predictable amount of time.     Precautions / Restrictions Precautions Precautions: Fall Restrictions Weight Bearing Restrictions: No      Mobility  Bed Mobility Overal bed mobility: Needs Assistance Bed Mobility: Supine to Sit     Supine to sit: Min assist     General bed mobility comments: Pt was able to get LEs to EOB and to ease toward sitting but had consistent R lateral lean, difficult righting to neutral needing minimal direct assist to attain    Transfers Overall transfer level: Needs assistance Equipment used: Rolling walker (2 wheels), 1 person hand held assist Transfers: Sit to/from Stand Sit to Stand: Min assist           General transfer comment: Pt was able to rise to standing multiple times today, however she had some clear unsteadiness and even with walker available to hold she had multiple bouts of unsteadiness and even lost balance just trying to maintain standing EOB and needing direct assist to maintain upright and did land back on the bed X2    Ambulation/Gait Ambulation/Gait assistance: Min assist Gait Distance (Feet): 150 Feet Assistive device: Rolling walker (2 wheels)         General Gait Details: Pt motivated to do a long bout of ambulation, however she consistently needed cues to stay inside the walker; pt leaning R and outside the R side of her walker with consistent veering L.  Her vitals were  stable but she had multiple stagger steps and one full LOBs that required direct assist to keep her from falling.  Pt far from her baseline and highly reliant on the walker with asymmetrical/limping cadence.  Stairs             Wheelchair Mobility    Modified Rankin (Stroke Patients Only)       Balance Overall balance assessment: Needs assistance Sitting-balance support: Bilateral upper extremity supported Sitting balance-Leahy Scale: Fair     Standing balance support: Bilateral upper extremity supported Standing balance-Leahy Scale: Poor Standing balance comment: Pt with multiple LOBs t/o session,  unable to maintain static standing w/o UEs, seemingly poor awareness of deficits                             Pertinent Vitals/Pain Pain Assessment Pain Assessment: No/denies pain (minimal L facial pain but reports "surprisingly not bad at all")    Home Living Family/patient expects to be discharged to:: Private residence Living Arrangements: Spouse/significant other (husband is mobile, active but with dementia and Parkinson's) Available Help at Discharge: Family (daughter reports she or son-in-law can provide 24/7 assist initially)   Home Access: Stairs to enter Entrance Stairs-Rails: Can reach both Entrance Stairs-Number of Steps: 3   Home Layout: One level Home Equipment: Agricultural consultant (2 wheels);Cane - single point;Grab bars - tub/shower;Shower seat;Hand held shower head      Prior Function Prior Level of Function : Independent/Modified Independent;Working/employed             Mobility Comments: Pt has had more and more falls recently, has ADs that she does not move.  Pt out of the home daily and does all the errands, etc - caregiver for her husband       Hand Dominance        Extremity/Trunk Assessment   Upper Extremity Assessment Upper Extremity Assessment: Generalized weakness (UE strength roughly equal bilaterally, decreased L finger opposition coordination.)    Lower Extremity Assessment Lower Extremity Assessment: Overall WFL for tasks assessed       Communication   Communication: No difficulties  Cognition Arousal/Alertness: Awake/alert Behavior During  Therapy: WFL for tasks assessed/performed Overall Cognitive Status: Within Functional Limits for tasks assessed                                 General Comments: per family speech seems slower and more labored than baseline        General Comments      Exercises     Assessment/Plan    PT Assessment Patient needs continued PT services  PT Problem List Decreased strength;Decreased balance;Decreased mobility;Decreased knowledge of use of DME;Decreased safety awareness       PT Treatment Interventions DME instruction;Gait training;Stair training;Functional mobility training;Therapeutic activities;Therapeutic exercise;Balance training;Neuromuscular re-education;Patient/family education    PT Goals (Current goals can be found in the Care Plan section)  Acute Rehab PT Goals Patient Stated Goal: Go home, not rehab PT Goal Formulation: With patient/family Time For Goal Achievement: 01/12/22 Potential to Achieve Goals: Good    Frequency Min 2X/week     Co-evaluation               AM-PAC PT "6 Clicks" Mobility  Outcome Measure Help needed turning from your back to your side while in a flat bed without using bedrails?: None Help needed moving from lying on your back  to sitting on the side of a flat bed without using bedrails?: None Help needed moving to and from a bed to a chair (including a wheelchair)?: A Little Help needed standing up from a chair using your arms (e.g., wheelchair or bedside chair)?: A Little Help needed to walk in hospital room?: A Lot Help needed climbing 3-5 steps with a railing? : A Lot 6 Click Score: 18    End of Session Equipment Utilized During Treatment: Gait belt Activity Tolerance: Patient tolerated treatment well Patient left: in chair;with call bell/phone within reach;with family/visitor present Nurse Communication: Mobility status PT Visit Diagnosis: Unsteadiness on feet (R26.81);Repeated falls (R29.6);Other abnormalities of  gait and mobility (R26.89);Other symptoms and signs involving the nervous system (R29.898)    Time: 9485-4627 PT Time Calculation (min) (ACUTE ONLY): 58 min   Charges:   PT Evaluation $PT Eval Moderate Complexity: 1 Mod PT Treatments $Gait Training: 8-22 mins $Therapeutic Activity: 8-22 mins        Malachi Pro, DPT 12/29/2021, 3:48 PM

## 2021-12-29 NOTE — Assessment & Plan Note (Addendum)
--  oral PPI ? ?

## 2021-12-29 NOTE — Progress Notes (Signed)
  Progress Note   Patient: Ashley [name] CBJ:[id number] DOB: [birth year] DOA: 12/28/2021     0 DOS: the patient was seen and examined on 12/29/2021   Brief hospital course: No notes on file  Assessment and Plan: * Intraparenchymal hemorrhage of brain (HCC) --1cm acute IPH L caudate, traumatic from the fall.  MRI brain ruled out stroke. --repeat brain imaging stable --neuro to see today   Essential hypertension --cont HCTZ   GERD (gastroesophageal reflux disease) --oral PPI  Nasal bone fractures --bilateral nasal bone fracture.  Suffered from fall.  Pt denied pain.   Frequent falls --pt declined SNF rehab.  Daughter is willing to provide 24/7 supervision and hire help --home with [facility]  Vitamin B 12 deficiency --level 190, on the low side.  May contribute to pt's balance issues. --IM vit B12 x1 today        Subjective:  Pt denied significant pain to her face or head.  Complained of congestion and nasal drainage.     Physical Exam:  Constitutional: NAD, AAOx3, significant bruising to the face HEENT: conjunctivae and lids normal, EOMI CV: No cyanosis.   RESP: normal respiratory effort, on RA Extremities: No effusions, edema in BLE SKIN: warm, dry Neuro: II - XII grossly intact.   Psych: Normal mood and affect.     Data Reviewed:  Family Communication:   Disposition: Status is: Inpatient   Planned Discharge Destination: Home with Home Health    Time spent: 50 minutes  Author: Darlin Priestly, MD 12/29/2021 7:28 PM  For on call review [email or web address].

## 2021-12-30 ENCOUNTER — Inpatient Hospital Stay: Payer: Medicare HMO | Admitting: Family Medicine

## 2021-12-30 DIAGNOSIS — I619 Nontraumatic intracerebral hemorrhage, unspecified: Secondary | ICD-10-CM | POA: Diagnosis not present

## 2021-12-30 LAB — CBC
HCT: 44.3 % (ref 36.0–46.0)
Hemoglobin: 14.2 g/dL (ref 12.0–15.0)
MCH: 28.9 pg (ref 26.0–34.0)
MCHC: 32.1 g/dL (ref 30.0–36.0)
MCV: 90.2 fL (ref 80.0–100.0)
Platelets: 321 10*3/uL (ref 150–400)
RBC: 4.91 MIL/uL (ref 3.87–5.11)
RDW: 14 % (ref 11.5–15.5)
WBC: 10.3 10*3/uL (ref 4.0–10.5)
nRBC: 0 % (ref 0.0–0.2)

## 2021-12-30 LAB — MAGNESIUM: Magnesium: 2.4 mg/dL (ref 1.7–2.4)

## 2021-12-30 LAB — BASIC METABOLIC PANEL
Anion gap: 12 (ref 5–15)
BUN: 12 mg/dL (ref 8–23)
CO2: 26 mmol/L (ref 22–32)
Calcium: 9.6 mg/dL (ref 8.9–10.3)
Chloride: 96 mmol/L — ABNORMAL LOW (ref 98–111)
Creatinine, Ser: 0.92 mg/dL (ref 0.44–1.00)
GFR, Estimated: >60 mL/min (ref >60)
Glucose, Bld: 102 mg/dL — ABNORMAL HIGH (ref 70–99)
Potassium: 2.9 mmol/L — ABNORMAL LOW (ref 3.5–5.1)
Sodium: 134 mmol/L — ABNORMAL LOW (ref 135–145)

## 2021-12-30 LAB — POTASSIUM: Potassium: 3.8 mmol/L (ref 3.5–5.1)

## 2021-12-30 MED ORDER — ENSURE ENLIVE PO LIQD: 237.0000 mL | Freq: Two times a day (BID) | ORAL | Status: DC

## 2021-12-30 MED ORDER — ADULT MULTIVITAMIN W/MINERALS CH: 1.0000 | ORAL_TABLET | Freq: Every day | ORAL | Status: DC

## 2021-12-30 MED ORDER — CELECOXIB 200 MG PO CAPS: 200.0000 mg | ORAL_CAPSULE | Freq: Two times a day (BID) | ORAL | Status: DC | PRN

## 2021-12-30 MED ORDER — POTASSIUM CHLORIDE CRYS ER 20 MEQ PO TBCR
40.0000 meq | EXTENDED_RELEASE_TABLET | ORAL | Status: AC
  Administered 2021-12-30 (×2): 40 meq via ORAL
  Filled 2021-12-30 (×2): qty 2

## 2021-12-30 MED ORDER — VITAMIN B-12 1000 MCG PO TABS: 1000.0000 ug | ORAL_TABLET | Freq: Every day | ORAL | 0 refills | Status: AC

## 2021-12-30 NOTE — Evaluation (Signed)
Occupational Therapy Evaluation Patient Details Name: Ashley [name] MRN: [id number] DOB: 14-Apr-1941 Today's Date: 12/30/2021   History of Present Illness 81 y.o. female with medical history significant of anxiety, GERD, hypertension, history of stroke in 2019 presenting with a fall (while petting her cat) injuring her face.  She has multiple abrasions, bruises and swelling to her face particularly worse around the left eye and left cheekbone.  Imaging reveals small acute left lateral periventricular hemorrhage with no acute infarct.   Clinical Impression   Patient presenting with decreased Ind in self care, balance, functional mobility/transfers, endurance, and safety awareness. Patient reports being Ind at baseline and still working. 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 Pt ambulating with min guard - min A into bathroom for toileting, hand hygiene in standing and back to bed. Pt with LOB x 2 to the R. Pt was also given pill box test which she was unable to complete in the allowed time frame. Pt is distracted by environment. Therapist actually cuing her several time s with the directions being re-read on pill bottle. Pt would not be able to safely manage her medications or husbands. She would need 24/7 supervision for safety if returning home. Per staff, daughter will be staying with pt at discharge. Patient will benefit from acute OT to increase overall independence in the areas of ADLs, functional mobility, and safety awareness in order to safely discharge home with family.     Recommendations for follow up therapy are one component of a multi-disciplinary discharge planning process, led by the attending physician.  Recommendations may be updated based on patient status, additional functional criteria and insurance authorization.   Follow Up Recommendations  Home health OT    Assistance Recommended at Discharge Frequent or constant  Supervision/Assistance  Patient can return home with the following A little help with walking and/or transfers;A little help with bathing/dressing/bathroom;Direct supervision/assist for medications management;Direct supervision/assist for financial management;Assistance with cooking/housework;Assist for transportation;Help with stairs or ramp for entrance    Functional Status Assessment  Patient has had a recent decline in their functional status and demonstrates the ability to make significant improvements in function in a reasonable and predictable amount of time.  Equipment Recommendations  None recommended by OT       Precautions / Restrictions Precautions Precautions: Fall Restrictions Weight Bearing Restrictions: No      Mobility Bed Mobility Overal bed mobility: Needs Assistance Bed Mobility: Supine to Sit, Sit to Supine     Supine to sit: Min guard, Supervision Sit to supine: Min guard        Transfers Overall transfer level: Needs assistance Equipment used: 1 person hand held assist Transfers: Sit to/from Stand Sit to Stand: Min assist                  Balance Overall balance assessment: Needs assistance Sitting-balance support: Bilateral upper extremity supported Sitting balance-Leahy Scale: Fair     Standing balance support: Single extremity supported Standing balance-Leahy Scale: Poor                             ADL either performed or assessed with clinical judgement   ADL Overall ADL's : Needs assistance/impaired     Grooming: Wash/dry hands;Wash/dry face;Standing;Min guard                   Toilet Transfer: Minimal Scientist, physiological  Manipulation and Hygiene: Sit to/from stand               Vision Patient Visual Report: No change from baseline              Pertinent Vitals/Pain Pain Assessment Pain Assessment: No/denies pain     Hand Dominance Right   Extremity/Trunk  Assessment Upper Extremity Assessment Upper Extremity Assessment: Generalized weakness   Lower Extremity Assessment Lower Extremity Assessment: Generalized weakness       Communication Communication Communication: No difficulties   Cognition Arousal/Alertness: Awake/alert Behavior During Therapy: WFL for tasks assessed/performed Overall Cognitive Status: History of cognitive impairments - at baseline (per family report at baseline)                                 General Comments: She follows commands with increased time and is A &O x4. However, is noted to have difficulty with problem solving and short term memory in room.                Home Living Family/patient expects to be discharged to:: Private residence Living Arrangements: Spouse/significant other Available Help at Discharge: Family Type of Home: House Home Access: Stairs to enter Secretary/administratorntrance Stairs-Number of Steps: 3 Entrance Stairs-Rails: Can reach both Home Layout: One level     Bathroom Shower/Tub: Producer, television/film/videoWalk-in shower   Bathroom Toilet: Standard     Home Equipment: Agricultural consultantolling Walker (2 wheels);Cane - single point;Grab bars - tub/shower;Shower seat;Hand held shower head      Lives With: Spouse    Prior Functioning/Environment Prior Level of Function : Independent/Modified Independent;Working/employed             Mobility Comments: Pt has had more and more falls recently, has ADs that she does not move.  Pt out of the home daily and does all the errands, etc - caregiver for her husband ADLs Comments: Pt reports being independent with self care, IADLs, and still working. She is also caregiver for her husband with dementia and parkinson's        OT Problem List: Decreased strength;Decreased activity tolerance;Impaired balance (sitting and/or standing);Cardiopulmonary status limiting activity;Decreased cognition;Decreased safety awareness;Decreased knowledge of use of DME or AE;Decreased knowledge  of precautions      OT Treatment/Interventions: Self-care/ADL training;Balance training;Modalities;Therapeutic exercise;Therapeutic activities;Energy conservation;DME and/or AE instruction;Visual/perceptual remediation/compensation;Patient/family education    OT Goals(Current goals can be found in the care plan section) Acute Rehab OT Goals Patient Stated Goal: to go home OT Goal Formulation: With patient Time For Goal Achievement: 01/13/22 Potential to Achieve Goals: Fair ADL Goals Pt Will Perform Grooming: with modified independence;standing Pt Will Perform Lower Body Dressing: with modified independence;sitting/lateral leans Pt Will Transfer to Toilet: with modified independence;ambulating Pt Will Perform Toileting - Clothing Manipulation and hygiene: with modified independence;sit to/from stand  OT Frequency: Min 2X/week       AM-PAC OT "6 Clicks" Daily Activity     Outcome Measure Help from another person eating meals?: None Help from another person taking care of personal grooming?: A Little Help from another person toileting, which includes using toliet, bedpan, or urinal?: A Little Help from another person bathing (including washing, rinsing, drying)?: A Little Help from another person to put on and taking off regular upper body clothing?: None Help from another person to put on and taking off regular lower body clothing?: A Little 6 Click Score: 20   End of Session Nurse Communication: Mobility  status  Activity Tolerance: Patient tolerated treatment well Patient left: in bed;with call bell/phone within reach;with bed alarm set  OT Visit Diagnosis: Unsteadiness on feet (R26.81);Repeated falls (R29.6);Muscle weakness (generalized) (M62.81)                Time: 9163-8466 OT Time Calculation (min): 29 min Charges:  OT General Charges $OT Visit: 1 Visit OT Evaluation $OT Eval Moderate Complexity: 1 Mod OT Treatments $Self Care/Home Management : 8-22 mins $Therapeutic  Activity: 8-22 mins  Jackquline Denmark, MS, OTR/L , CBIS ascom [phone]  12/30/21, 1:11 PM

## 2021-12-30 NOTE — TOC Initial Note (Signed)
Transition of Care [facility]) - Initial/Assessment Note    Patient Details  Name: Ashley [name] MRN: [id number] Date of Birth: 1941-01-12  Transition of [facility]) CM/SW Contact:    Caryn Section, RN Phone Number: 12/30/2021, 3:29 PM  Clinical Narrative:     Patient lives with spouse, daughter at bedside.  They state they are aware of 24/7 need for care.  They accept home health and have contact with a private pay nursing assistant.  Daughter can help to get meds and transport patient to appointments.  Daughter states they have all DME needed.  [facility] will provide care per [facility].                Expected Discharge Plan: Home w Home Health Services     Patient Goals and CMS Choice        Expected Discharge Plan and Services Expected Discharge Plan: Home w Home Health Services     Post Acute Care Choice: Home Health   Expected Discharge Date: 12/30/21                                    Prior Living Arrangements/Services   Lives with:: Self, Spouse Patient language and need for interpreter reviewed:: Yes Do you feel safe going back to the place where you live?: Yes      Need for Family Participation in Patient Care: Yes (Comment) Care giver support system in place?: Yes (comment)   Criminal Activity/Legal Involvement Pertinent to Current Situation/Hospitalization: No - Comment as needed  Activities of Daily Living Home Assistive Devices/Equipment: Cane (specify quad or straight), Walker (specify type) ADL Screening (condition at time of admission) Patient's cognitive ability adequate to safely complete daily activities?: Yes Is the patient deaf or have difficulty hearing?: No Does the patient have difficulty seeing, even when wearing glasses/contacts?: Yes Does the patient have difficulty concentrating, remembering, or making decisions?: Yes Patient able to express need for assistance with ADLs?: Yes Does the patient have difficulty dressing or bathing?:  No Independently performs ADLs?: Yes (appropriate for developmental age) Does the patient have difficulty walking or climbing stairs?: No Weakness of Legs: None Weakness of Arms/Hands: None  Permission Sought/Granted Permission sought to share information with : Case Manager       Permission granted to share info w AGENCY: home health        Emotional Assessment Appearance:: Appears stated age Attitude/Demeanor/Rapport: Gracious, Engaged, Lethargic   Orientation: : Oriented to Self, Oriented to Place, Oriented to  Time, Oriented to Situation Alcohol / Substance Use: Not Applicable Psych Involvement: No (comment)  Admission diagnosis:  Cerebral hemorrhage (HCC) [I61.9] ICH (intracerebral hemorrhage) (HCC) [I61.9] Patient Active Problem List   Diagnosis Date Noted   Intraparenchymal hemorrhage of brain (HCC) 12/29/2021   Vitamin B 12 deficiency 12/29/2021   Frequent falls 12/29/2021   Nasal bone fractures 12/29/2021   Balance problem 08/02/2021   Snoring 03/28/2021   Tingling of left shoulder 03/28/2021   Family history of abdominal aortic aneurysm (AAA) 03/28/2021   Chronic allergic conjunctivitis 01/18/2021   Allergic rhinitis 01/18/2021   Endometrial polyp 12/01/2020   Pelvic pain in female 12/01/2020   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   Shoulder strain, right, initial encounter 09/07/2020   Right foot pain 08/09/2020   Primary osteoarthritis of left knee 03/30/2020   Tachycardia 02/03/2020   Carpal tunnel syndrome 05/07/2019   Stress incontinence  05/07/2019   Left knee pain 09/20/2018   Hypertriglyceridemia 08/28/2018   Partial tear of left rotator cuff 08/28/2018   Plantar fasciitis of right foot 05/17/2018   De Quervain's tenosynovitis, right 05/17/2018   Bilateral knee pain 04/23/2018   Left arm pain 03/18/2018   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 [place] [address] Phone: [phone] Fax: [phone]     Social Determinants of Health (SDOH) Interventions    Readmission Risk Interventions     View : No data to display.

## 2021-12-30 NOTE — Progress Notes (Signed)
Pt A/Ox4 upon review of AVS summary. X3 PIVs removed. Daughter assisting patient to get dressed. Will escort patient to entrance with wheelchair when ready.

## 2021-12-30 NOTE — Progress Notes (Signed)
Physical Therapy Treatment Patient Details Name: Ashley [name] MRN: [id number] DOB: 1940/12/01 Today's Date: 12/30/2021   History of Present Illness 81 y.o. female with medical history significant of anxiety, GERD, hypertension, history of stroke in 2019 presenting with a fall (while petting her cat) injuring her face.  She has multiple abrasions, bruises and swelling to her face particularly worse around the left eye and left cheekbone.  Imaging reveals small acute left lateral periventricular hemorrhage with no acute infarct.    PT Comments    Pt seen for PT tx with pt's daughter Ashley [name]) & husband Ashley [name]) present for session. Pt ambulates without AD with min assist with impaired gait pattern as noted below; pt demonstrates anterior lean & shuffled gait which increases her fall risk with mobility. Pt negotiated 6 steps with max cuing to utilize B rails to simulate home environment; pt requires mod fade to min assist. Pt's daughter Ashley [name]) then participated in hands on training where she assisted pt with gait with RW & stair negotiation with B rails. PT educated caregiver on positioning in relation to pt when pt negotiated stairs as well as pt's need to use RW for mobility. Pt's daughter voiced understanding of all education.    Recommendations for follow up therapy are one component of a multi-disciplinary discharge planning process, led by the attending physician.  Recommendations may be updated based on patient status, additional functional criteria and insurance authorization.  Follow Up Recommendations  Home health PT     Assistance Recommended at Discharge Frequent or constant Supervision/Assistance  Patient can return home with the following A little help with walking and/or transfers;Assistance with cooking/housework;Assist for transportation;Help with stairs or ramp for entrance;A little help with bathing/dressing/bathroom;Direct supervision/assist for financial management;Direct  supervision/assist for medications management   Equipment Recommendations  None recommended by PT (pt already has RW)    Recommendations for Other Services       Precautions / Restrictions Precautions Precautions: Fall Restrictions Weight Bearing Restrictions: No     Mobility  Bed Mobility               General bed mobility comments: not observed, pt received & left sitting EOB    Transfers Overall transfer level: Needs assistance Equipment used: None, Rolling walker (2 wheels) Transfers: Sit to/from Stand Sit to Stand: Min guard           General transfer comment: educational cuing re: hand placement during STS with RW    Ambulation/Gait Ambulation/Gait assistance: Min assist Gait Distance (Feet):  (250 + 200 ft) Assistive device: None, Rolling walker (2 wheels)         General Gait Details: Pt ambulates initial 250 ft without AD & min assist with pt demonstrating R lateral lean, shuffled gait with decreased dorsiflexion BLE & decreased heel strike, with anterior momentum with PT providing cuing to decrease gait speed. Pt ambulates 2nd time with RW & CGA with ongoing shuffled gait but decreased gait speed. Pt requires cuing to avoid veering to R.   Stairs Stairs: Yes Stairs assistance: Min assist, Mod assist (mod fade to min assist) Stair Management: Two rails Number of Stairs:  (6 + 5) General stair comments: cuing to use B rails to simulate home set up, mod assist fade to min assist, pt elects for reciprocal pattern   Wheelchair Mobility    Modified Rankin (Stroke Patients Only)       Balance Overall balance assessment: Needs assistance Sitting-balance support: Feet supported Sitting balance-Leahy Scale: Fair  Standing balance support: No upper extremity supported, During functional activity Standing balance-Leahy Scale: Poor                              Cognition Arousal/Alertness: Awake/alert Behavior During Therapy:  WFL for tasks assessed/performed Overall Cognitive Status: History of cognitive impairments - at baseline (per OT note)                                 General Comments: Pt with poor short term memory, sustained attention, anticipatory awareness.        Exercises      General Comments General comments (skin integrity, edema, etc.): Provided daughter with personal gait belt, educated pt & family on recommendation of supervision at all times & assistance with mobility as well as need to use AD.      Pertinent Vitals/Pain Pain Assessment Pain Assessment: No/denies pain    Home Living Family/patient expects to be discharged to:: Private residence Living Arrangements: Spouse/significant other Available Help at Discharge: Family Type of Home: House Home Access: Stairs to enter Entrance Stairs-Rails: Can reach both Entrance Stairs-Number of Steps: 3   Home Layout: One level Home Equipment: Conservation officer, nature (2 wheels);Cane - single point;Grab bars - tub/shower;Shower seat;Hand held shower head      Prior Function            PT Goals (current goals can now be found in the care plan section) Acute Rehab PT Goals Patient Stated Goal: Go home, not rehab PT Goal Formulation: With patient/family Time For Goal Achievement: 01/12/22 Potential to Achieve Goals: Good Progress towards PT goals: Progressing toward goals    Frequency    Min 2X/week      PT Plan Current plan remains appropriate    Co-evaluation              AM-PAC PT "6 Clicks" Mobility   Outcome Measure  Help needed turning from your back to your side while in a flat bed without using bedrails?: None Help needed moving from lying on your back to sitting on the side of a flat bed without using bedrails?: None Help needed moving to and from a bed to a chair (including a wheelchair)?: A Little Help needed standing up from a chair using your arms (e.g., wheelchair or bedside chair)?: A  Little Help needed to walk in hospital room?: A Little Help needed climbing 3-5 steps with a railing? : A Little 6 Click Score: 20    End of Session Equipment Utilized During Treatment: Gait belt Activity Tolerance: Patient tolerated treatment well Patient left:  (sitting EOB with daughter & husband present)   PT Visit Diagnosis: Unsteadiness on feet (R26.81);Repeated falls (R29.6);Other abnormalities of gait and mobility (R26.89);Other symptoms and signs involving the nervous system (R29.898)     Time: [id number] PT Time Calculation (min) (ACUTE ONLY): 25 min  Charges:  $Therapeutic Activity: 23-37 mins                     Lavone Nian, PT, DPT 12/30/21, 2:58 PM    Waunita Schooner 12/30/2021, 2:55 PM

## 2021-12-30 NOTE — Discharge Summary (Signed)
Physician Discharge Summary   Ashley [name]  female DOB: 1940/09/16  RUE:[id number]  PCP: Glori Luis, MD  Admit date: 12/28/2021 Discharge date: 12/30/2021  Admitted From: home Disposition:  home Daughter and husband updated at bedside prior to discharge. Home Health: Yes CODE STATUS: Full code  Discharge Instructions     Diet - low sodium heart healthy   Complete by: As directed       Hospital Course:  For full details, please see H&P, progress notes, consult notes and ancillary notes.  Briefly,  Ashley [name] is a 81 y.o. female with medical history significant of anxiety, GERD, hypertension, history of stroke in 2019 presenting with a fall today and injuring her face patient was walking down to get her cat and fell face forward.  She has multiple abrasions and cuts and bruises and bleeding and swelling to her face.  CT head showed intraparenchymal hemorrhage.  * Intraparenchymal hemorrhage of brain (HCC) --1cm acute IPH L caudate, traumatic from the fall.  MRI brain ruled out stroke. --repeat brain imaging stable --neuro saw pt and cleared for discharge.   Frequent falls --pt declined SNF rehab.  Daughter is willing to provide 24/7 supervision and hire help --[facility] with [facility]  Vitamin B 12 deficiency --level 190, on the low side.  May contribute to pt's balance issues. --received IM vit B12 x1 and discharged on oral vit B12 supplement.  Essential hypertension --cont HCTZ   GERD (gastroesophageal reflux disease) --oral PPI   Nasal bone fractures --bilateral nasal bone fracture.  Suffered from fall.  Pt denied pain.    Discharge Diagnoses:  Principal Problem:   Intraparenchymal hemorrhage of brain (HCC) Active Problems:   Essential hypertension   GERD (gastroesophageal reflux disease)   Vitamin B 12 deficiency   Frequent falls   Nasal bone fractures   30 Day Unplanned Readmission Risk Score    Flowsheet Row ED to Hosp-Admission (Current)  from 12/28/2021 in [facility] 1C MEDICAL TELEMETRY  30 Day Unplanned Readmission Risk Score (%) 7.66 Filed at 12/30/2021 1200       This score is the patient's risk of an unplanned readmission within 30 days of being discharged (0 -100%). The score is based on dignosis, age, lab data, medications, orders, and past utilization.   Low:  0-14.9   Medium: 15-21.9   High: 22-29.9   Extreme: 30 and above         Discharge Instructions:  Allergies as of 12/30/2021       Reactions   Ace Inhibitors Other (See Comments)   Angioedema   Pravastatin Other (See Comments)   Other reaction(s): Muscle Pain   Simvastatin Other (See Comments)   stiffness        Medication List     STOP taking these medications    predniSONE 20 MG tablet Commonly known as: DELTASONE Not taking prednisone PTA.      TAKE these medications    acetaminophen 650 MG CR tablet Commonly known as: TYLENOL Take 1,300 mg by mouth every 8 (eight) hours as needed for pain.   aspirin EC 81 MG tablet Take 81 mg by mouth daily. Swallow whole.   buPROPion 300 MG 24 hr tablet Commonly known as: WELLBUTRIN XL Take 1 tablet (300 mg total) by mouth daily.   celecoxib 200 MG capsule Commonly known as: CELEBREX Take 1 capsule (200 mg total) by mouth 2 (two) times daily as needed. Home med. What changed:  when to  take this reasons to take this additional instructions   cetirizine 10 MG tablet Commonly known as: ZYRTEC Take 10 mg by mouth daily.   EPINEPHrine 0.3 mg/0.3 mL Soaj injection Commonly known as: EpiPen 2-Pak Inject 0.3 mLs (0.3 mg total) into the muscle as needed for anaphylaxis.   fluticasone 50 MCG/ACT nasal spray Commonly known as: FLONASE Place 1 spray into both nostrils daily.   gabapentin 100 MG capsule Commonly known as: NEURONTIN Take 1 capsule (100 mg total) by mouth at bedtime.   hydrochlorothiazide 25 MG tablet Commonly known as: HYDRODIURIL TAKE 1 TABLET BY  MOUTH DAILY   meclizine 12.5 MG tablet Commonly known as: ANTIVERT Take 1 tablet (12.5 mg total) by mouth 3 (three) times daily as needed for dizziness.   Melatonin 10 MG Tabs Take 10 mg by mouth at bedtime.   omeprazole 20 MG capsule Commonly known as: PRILOSEC TAKE 1 CAPSULE EVERY DAY   OVER THE COUNTER MEDICATION Take 2 tablets by mouth daily. Glucocil   rosuvastatin 40 MG tablet Commonly known as: CRESTOR TAKE 1 TABLET BY MOUTH ONCE DAILY   Systane 0.4-0.3 % Soln Generic drug: Polyethyl Glycol-Propyl Glycol Place 1 drop into both eyes 3 (three) times daily as needed (dry/irritated eyes.).   traMADol 50 MG tablet Commonly known as: ULTRAM Take 25-50 mg by mouth 2 (two) times daily as needed.   venlafaxine XR 150 MG 24 hr capsule Commonly known as: EFFEXOR-XR TAKE 1 CAPSULE BY MOUTH EVERY DAY   vitamin B-12 1000 MCG tablet Commonly known as: CYANOCOBALAMIN Take 1 tablet (1,000 mcg total) by mouth daily. Can take any form of over-the-counter.         Follow-up Information     Glori Luis, MD Follow up in 1 week(s).   Specialty: Family Medicine Contact information: [address] [place] [address] [phone]                 Allergies  Allergen Reactions   Ace Inhibitors Other (See Comments)    Angioedema   Pravastatin Other (See Comments)    Other reaction(s): Muscle Pain   Simvastatin Other (See Comments)    stiffness     The results of significant diagnostics from this hospitalization (including imaging, microbiology, ancillary and laboratory) are listed below for reference.   Consultations:   Procedures/Studies: CT ANGIO HEAD W OR WO CONTRAST  Result Date: 12/28/2021 CLINICAL DATA:  Trip and fall, head strike EXAM: CT ANGIOGRAPHY HEAD TECHNIQUE: Multidetector CT imaging of the head was performed using the standard protocol during bolus administration of intravenous contrast. Multiplanar CT image reconstructions  and MIPs were obtained to evaluate the vascular anatomy. RADIATION DOSE REDUCTION: This exam was performed according to the departmental dose-optimization program which includes automated exposure control, adjustment of the mA and/or kV according to patient size and/or use of iterative reconstruction technique. CONTRAST:  75mL OMNIPAQUE IOHEXOL 350 MG/ML SOLN COMPARISON:  CT head 12/28/2021 2:07 p.m. FINDINGS: CT HEAD Brain: No acute infarct, mass, mass effect, or midline shift. Redemonstrated small area of acute hemorrhage along the lateral margin of the left lateral ventricle at the level of the corona radiata, which now measures up to 11 x 6 x 9 mm, previously 10 x 5 x 7 mm, minimally increased in size. No hydrocephalus or extra-axial collection. Periventricular white matter changes, likely the sequela of chronic small vessel ischemic disease. Mild cerebral atrophy. Vascular: No hyperdense vessel. Atherosclerotic calcifications in the intracranial carotid and vertebral arteries. Skull:  Normal. Negative for fracture or focal lesion. Sinuses: No acute finding. Other: The mastoids are well aerated. Status post bilateral lens replacements. CTA HEAD FINDINGS Anterior circulation: Both internal carotid arteries are patent to the termini, with calcifications in the distal petrous, cavernous, and supraclinoid portion, which cause up to mild narrowing in the cavernous and supraclinoid portions. A1 segments patent. Normal anterior communicating artery. Anterior cerebral arteries are patent to their distal aspects. No M1 stenosis or occlusion. Normal MCA bifurcations. Distal MCA branches perfused and symmetric. Posterior circulation: Vertebral arteries patent to the vertebrobasilar junction, with mild-to-moderate stenosis in the proximal right V4. Posterior inferior cerebral arteries patent bilaterally. Basilar patent to its distal aspect. Superior cerebellar arteries patent bilaterally. Diminutive, patent P1 segments, with  prominent bilateral posterior communicating arteries and near fetal origins of the bilateral PCAs. PCAs perfused to their distal aspects without stenosis. Venous sinuses: As permitted by contrast timing, patent. Anatomic variants: None significant. Review of the MIP images confirms the above findings IMPRESSION: 1. Redemonstrated small area of acute hemorrhage along the lateral margin of the left lateral ventricle, which is minimally increased in size compared to the CT head from 2:07 p.m. no additional acute intracranial process. 2. No intracranial large vessel occlusion. Mild stenosis in the cavernous and supraclinoid ICAs, and mild-to-moderate stenosis in the proximal right V4. Electronically Signed   By: Wiliam Ke M.D.   On: 12/28/2021 23:29   CT Head Wo Contrast  Result Date: 12/28/2021 CLINICAL DATA:  Fall, tripped and fell outside striking her face, abrasions with bleeding, bruising, and swelling, head trauma moderate to severe, history hypertension, stroke EXAM: CT HEAD WITHOUT CONTRAST CT MAXILLOFACIAL WITHOUT CONTRAST TECHNIQUE: Multidetector CT imaging of the head and maxillofacial structures were performed using the standard protocol without intravenous contrast. Multiplanar CT image reconstructions of the maxillofacial structures were also generated. Right side of face marked with BB. RADIATION DOSE REDUCTION: This exam was performed according to the departmental dose-optimization program which includes automated exposure control, adjustment of the mA and/or kV according to patient size and/or use of iterative reconstruction technique. COMPARISON:  12/22/2021 FINDINGS: CT HEAD FINDINGS Brain: Generalized atrophy. Normal ventricular morphology. No midline shift or mass effect. Small vessel chronic ischemic changes of deep cerebral white matter. Small focal acute intraparenchymal hemorrhage identified within anterior LEFT caudate, 10 x 6 x 8 mm question hemorrhagic infarct versus sequela of  trauma. No additional areas of infarction identified. Vascular: No hyperdense vessels. Atherosclerotic calcification of internal carotid and vertebral arteries at skull base Skull: Intact Other: N/A CT MAXILLOFACIAL FINDINGS Osseous: TMJ alignment normal bilaterally. Mandible intact. BILATERAL nasal bone fractures, mildly displaced. Scattered beam hardening artifacts of dental origin. Slight nasal septal deviation to the RIGHT. No additional facial bone fractures. Orbits, zygomas, and sinuses intact. Extensive degenerative disc and facet disease changes of cervical spine. Orbits: Intraorbital soft tissue planes clear Sinuses: Paranasal sinuses, mastoid air cells, and middle ear cavities clear. Soft tissues: Soft tissue swelling of nose and payroll regions. Small hematoma LEFT nose. Atherosclerotic calcifications at carotid bifurcations. Remaining visualized soft tissues unremarkable. IMPRESSION: Atrophy with small vessel chronic ischemic changes of deep cerebral white matter. Small focal acute intraparenchymal hemorrhage within the anterior LEFT caudate, 10 x 6 x 8 mm question hemorrhagic infarct versus sequela of trauma. BILATERAL nasal bone fractures. Critical Value/emergent results were called by telephone at the time of interpretation on 12/28/2021 at 1416 hours to provider Willy Eddy , who verbally acknowledged these results. Electronically Signed   By: Loraine Leriche  Tyron Russell M.D.   On: 12/28/2021 14:17   CT HEAD WO CONTRAST ( )  Result Date: 12/22/2021 CLINICAL DATA:  Mental status change, dizziness, nervousness EXAM: CT HEAD WITHOUT CONTRAST TECHNIQUE: Contiguous axial images were obtained from the base of the skull through the vertex without intravenous contrast. RADIATION DOSE REDUCTION: This exam was performed according to the departmental dose-optimization program which includes automated exposure control, adjustment of the mA and/or kV according to patient size and/or use of iterative reconstruction  technique. COMPARISON:  No prior CT, correlation is made with 04/04/2018 MRI brain FINDINGS: Brain: No evidence of acute infarction, hemorrhage, cerebral edema, mass, mass effect, or midline shift. No hydrocephalus or extra-axial fluid collection. Periventricular white matter changes, likely the sequela of chronic small vessel ischemic disease. Mildly advanced atrophy for age. Vascular: No hyperdense vessel. Skull: Normal. Negative for fracture or focal lesion. Sinuses/Orbits: No acute finding. Status post bilateral lens replacements. Other: The mastoid air cells are well aerated. IMPRESSION: No acute intracranial process. Electronically Signed   By: Wiliam Ke M.D.   On: 12/22/2021 17:19   MR BRAIN WO CONTRAST  Result Date: 12/28/2021 CLINICAL DATA:  Neuro deficit, acute, stroke suspected. Fall with acute hemorrhage in the left caudate region on CT. EXAM: MRI HEAD WITHOUT CONTRAST TECHNIQUE: Multiplanar, multiecho pulse sequences of the brain and surrounding structures were obtained without intravenous contrast. COMPARISON:  Head CT 12/28/2021 and MRI 12/22/2021 FINDINGS: The study is moderately motion degraded despite utilizing faster, more motion resistant imaging protocols and attempts at repeat imaging. Brain: As seen on today's earlier CT, there is a 10 mm acute hemorrhage along the lateral margin of the left lateral ventricle at the level of the corona radiata. No acute infarct is identified separate from this. Confluent T2 hyperintensities in the cerebral white matter and pons are similar to the recent prior MRI and are nonspecific but compatible with severe chronic small vessel ischemic disease. No midline shift or extra-axial fluid collection is evident. There is mild cerebral atrophy. Vascular: Major intracranial vascular flow voids are preserved. Skull and upper cervical spine: Unremarkable bone marrow signal para Sinuses/Orbits: Bilateral cataract extraction. Paranasal sinuses and mastoid air  cells are clear. Other: None. IMPRESSION: 1. Motion degraded examination. 2. Small acute left lateral periventricular hemorrhage. No separate acute infarct infarct. Identified 3. Severe chronic small vessel ischemic disease. Electronically Signed   By: Sebastian Ache M.D.   On: 12/28/2021 20:58   MR BRAIN WO CONTRAST  Result Date: 12/22/2021 CLINICAL DATA:  Acute neurologic deficit EXAM: MRI HEAD WITHOUT CONTRAST TECHNIQUE: Multiplanar, multiecho pulse sequences of the brain and surrounding structures were obtained without intravenous contrast. COMPARISON:  None Available. FINDINGS: Brain: No acute infarct, mass effect or extra-axial collection. No acute or chronic hemorrhage. There is confluent hyperintense T2-weighted signal within the white matter. Generalized cerebral volume loss. The midline structures are normal. Vascular: Major flow voids are preserved. Skull and upper cervical spine: Normal calvarium and skull base. Visualized upper cervical spine and soft tissues are normal. Sinuses/Orbits:No paranasal sinus fluid levels or advanced mucosal thickening. No mastoid or middle ear effusion. Normal orbits. IMPRESSION: 1. No acute intracranial abnormality. 2. Advanced chronic small vessel ischemic disease and cerebral volume loss. Electronically Signed   By: Deatra Robinson M.D.   On: 12/22/2021 21:46   CT Maxillofacial Wo Contrast  Result Date: 12/28/2021 CLINICAL DATA:  Fall, tripped and fell outside striking her face, abrasions with bleeding, bruising, and swelling, head trauma moderate to severe, history hypertension, stroke EXAM: CT  HEAD WITHOUT CONTRAST CT MAXILLOFACIAL WITHOUT CONTRAST TECHNIQUE: Multidetector CT imaging of the head and maxillofacial structures were performed using the standard protocol without intravenous contrast. Multiplanar CT image reconstructions of the maxillofacial structures were also generated. Right side of face marked with BB. RADIATION DOSE REDUCTION: This exam was  performed according to the departmental dose-optimization program which includes automated exposure control, adjustment of the mA and/or kV according to patient size and/or use of iterative reconstruction technique. COMPARISON:  12/22/2021 FINDINGS: CT HEAD FINDINGS Brain: Generalized atrophy. Normal ventricular morphology. No midline shift or mass effect. Small vessel chronic ischemic changes of deep cerebral white matter. Small focal acute intraparenchymal hemorrhage identified within anterior LEFT caudate, 10 x 6 x 8 mm question hemorrhagic infarct versus sequela of trauma. No additional areas of infarction identified. Vascular: No hyperdense vessels. Atherosclerotic calcification of internal carotid and vertebral arteries at skull base Skull: Intact Other: N/A CT MAXILLOFACIAL FINDINGS Osseous: TMJ alignment normal bilaterally. Mandible intact. BILATERAL nasal bone fractures, mildly displaced. Scattered beam hardening artifacts of dental origin. Slight nasal septal deviation to the RIGHT. No additional facial bone fractures. Orbits, zygomas, and sinuses intact. Extensive degenerative disc and facet disease changes of cervical spine. Orbits: Intraorbital soft tissue planes clear Sinuses: Paranasal sinuses, mastoid air cells, and middle ear cavities clear. Soft tissues: Soft tissue swelling of nose and payroll regions. Small hematoma LEFT nose. Atherosclerotic calcifications at carotid bifurcations. Remaining visualized soft tissues unremarkable. IMPRESSION: Atrophy with small vessel chronic ischemic changes of deep cerebral white matter. Small focal acute intraparenchymal hemorrhage within the anterior LEFT caudate, 10 x 6 x 8 mm question hemorrhagic infarct versus sequela of trauma. BILATERAL nasal bone fractures. Critical Value/emergent results were called by telephone at the time of interpretation on 12/28/2021 at 1416 hours to provider Willy Eddy , who verbally acknowledged these results. Electronically  Signed   By: Ulyses Southward M.D.   On: 12/28/2021 14:17      Labs: BNP (last 3 results) No results for input(s): BNP in the last 8760 hours. Basic Metabolic Panel: Recent Labs  Lab 12/28/21 1522 12/30/21 0445 12/30/21 1356  NA 137 134*  --   K 4.5 2.9* 3.8  CL 99 96*  --   CO2 28 26  --   GLUCOSE 103* 102*  --   BUN 14 12  --   CREATININE 0.95 0.92  --   CALCIUM 9.4 9.6  --   MG  --  2.4  --    Liver Function Tests: No results for input(s): AST, ALT, ALKPHOS, BILITOT, PROT, ALBUMIN in the last 168 hours. No results for input(s): LIPASE, AMYLASE in the last 168 hours. No results for input(s): AMMONIA in the last 168 hours. CBC: Recent Labs  Lab 12/28/21 1522 12/30/21 0445  WBC 9.3 10.3  NEUTROABS 6.1  --   HGB 14.1 14.2  HCT 45.2 44.3  MCV 93.4 90.2  PLT 304 321   Cardiac Enzymes: No results for input(s): CKTOTAL, CKMB, CKMBINDEX, TROPONINI in the last 168 hours. BNP: Invalid input(s): POCBNP CBG: Recent Labs  Lab 12/29/21 0145  GLUCAP 97   D-Dimer No results for input(s): DDIMER in the last 72 hours. Hgb A1c No results for input(s): HGBA1C in the last 72 hours. Lipid Profile No results for input(s): CHOL, HDL, LDLCALC, TRIG, CHOLHDL, LDLDIRECT in the last 72 hours. Thyroid function studies No results for input(s): TSH, T4TOTAL, T3FREE, THYROIDAB in the last 72 hours.  Invalid input(s): FREET3 Anemia work up [employer]  12/29/21 1153  VITAMINB12 190   Urinalysis    Component Value Date/Time   COLORURINE YELLOW (A) 12/22/2021 1451   APPEARANCEUR CLEAR (A) 12/22/2021 1451   APPEARANCEUR Cloudy (A) 03/17/2019 1112   LABSPEC 1.012 12/22/2021 1451   PHURINE 7.0 12/22/2021 1451   GLUCOSEU NEGATIVE 12/22/2021 1451   HGBUR NEGATIVE 12/22/2021 1451   BILIRUBINUR NEGATIVE 12/22/2021 1451   BILIRUBINUR 1+ 08/18/2020 1023   BILIRUBINUR Negative 03/17/2019 1112   KETONESUR NEGATIVE 12/22/2021 1451   PROTEINUR NEGATIVE 12/22/2021 1451   UROBILINOGEN  4.0 (A) 08/18/2020 1023   NITRITE NEGATIVE 12/22/2021 1451   LEUKOCYTESUR NEGATIVE 12/22/2021 1451   Sepsis Labs Invalid input(s): PROCALCITONIN,  WBC,  LACTICIDVEN Microbiology Recent Results (from the past 240 hour(s))  MRSA Next Gen by PCR, Nasal     Status: None   Collection Time: 12/29/21  3:44 AM   Specimen: Nasal Mucosa; Nasal Swab  Result Value Ref Range Status   MRSA by PCR Next Gen NOT DETECTED NOT DETECTED Final    Comment: (NOTE) The GeneXpert MRSA Assay (FDA approved for NASAL specimens only), is one component of a comprehensive MRSA colonization surveillance program. It is not intended to diagnose MRSA infection nor to guide or monitor treatment for MRSA infections. Test performance is not FDA approved in patients less than 385 years old. Performed at [facility], [address] [place], [place] [id number]      Total time spend on discharging this patient, including the last patient exam, discussing the hospital stay, instructions for ongoing care as it relates to all pertinent caregivers, as well as preparing the medical discharge records, prescriptions, and/or referrals as applicable, is 45 minutes.    Darlin Priestlyina Antoninette Lerner, MD  [facility] 12/30/2021, 2:59 PM

## 2021-12-30 NOTE — Evaluation (Signed)
Speech Language Pathology Evaluation Patient Details Name: Ashley [name] MRN: [id number] DOB: Mar 08, 1941 Today's Date: 12/30/2021 Time: 0825-0920 SLP Time Calculation (min) (ACUTE ONLY): 55 min  Problem List:  Patient Active Problem List   Diagnosis Date Noted   Intraparenchymal hemorrhage of brain (HCC) 12/29/2021   Vitamin B 12 deficiency 12/29/2021   Frequent falls 12/29/2021   Nasal bone fractures 12/29/2021   Balance problem 08/02/2021   Snoring 03/28/2021   Tingling of left shoulder 03/28/2021   Family history of abdominal aortic aneurysm (AAA) 03/28/2021   Chronic allergic conjunctivitis 01/18/2021   Allergic rhinitis 01/18/2021   Endometrial polyp 12/01/2020   Pelvic pain in female 12/01/2020   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   Shoulder strain, right, initial encounter 09/07/2020   Right foot pain 08/09/2020   Primary osteoarthritis of left knee 03/30/2020   Tachycardia 02/03/2020   Carpal tunnel syndrome 05/07/2019   Stress incontinence 05/07/2019   Left knee pain 09/20/2018   Hypertriglyceridemia 08/28/2018   Partial tear of left rotator cuff 08/28/2018   Plantar fasciitis of right foot 05/17/2018   De Quervain's tenosynovitis, right 05/17/2018   Bilateral knee pain 04/23/2018   Left arm pain 03/18/2018   Spinal stenosis of lumbar region with radiculopathy 11/12/2017   Bilateral leg edema 10/11/2017   Prediabetes 10/11/2017   Chronic low back pain 10/11/2017   Vertigo 10/11/2017   Post-menopausal bleeding 07/09/2017   Neck pain 07/09/2017   Family history of ovarian cancer 01/16/2017   Essential hypertension 01/16/2017   GERD (gastroesophageal reflux disease) 01/16/2017   Allergic angioedema 04/18/2016   Depression 12/28/2014   Hyperlipidemia 12/28/2014   Pure hypercholesterolemia 12/28/2014   Past Medical History:  Past Medical History:  Diagnosis Date   Abnormal uterine bleeding (AUB)    a. benign endometrial polyps on D+C in  03/2017   Anxiety    Arthritis    Carpal tunnel syndrome of left wrist    Chicken pox    Depression    GERD (gastroesophageal reflux disease)    History of stress test    a. 08/2017 Lexiscan MV: EF 55-65%. No ischemia/infarct. Low risk.   Hyperlipidemia    Hypertension    Lacunar infarction (HCC)    a. Dx in setting of dizziness/vertigo; b. 02/2018 Carotid U/S: bilateral intimal thickening and atherosclerotic plaque. No hemodynamically significant stenoses; c. 03/2018 MRI Brain: No mass or abnl enhancement. Chronic microvascular ischemic changes and mod volume loss of the brain. 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   Spinal stenosis of lumbar region with radiculopathy 2019   Stroke (HCC)    PT STATES SHE WAS TOLD THIS BY HER PCP BUT PT STATES SHE NEVER KNEW SHE HAD A STROKE-NO RESIDUAL EFFECTS   Vertigo    Past Surgical History:  Past Surgical History:  Procedure Laterality Date   BACK SURGERY  11/19/2017   lumbar laminectomy   CARPAL TUNNEL RELEASE Right 07/30/2019   CARPAL TUNNEL RELEASE Left 02/21/2021   Procedure: CARPAL TUNNEL RELEASE;  Surgeon: Donato Heinz, MD;  Location: [facility];  Service: Orthopedics;  Laterality: Left;   COLONOSCOPY     2003, 2011, 2021   COLONOSCOPY WITH PROPOFOL N/A 12/24/2019   Procedure: COLONOSCOPY WITH PROPOFOL;  Surgeon: Toledo, Boykin Nearing, MD;  Location: [facility];  Service: Gastroenterology;  Laterality: N/A;   CYSTOSCOPY MACROPLASTIQUE IMPLANT N/A 05/15/2019   Procedure: CYSTOSCOPY MACROPLASTIQUE IMPLANT;  Surgeon: Orson Ape, MD;  Location: [facility];  Service: Urology;  Laterality: N/A;   DILATATION & CURETTAGE/HYSTEROSCOPY WITH MYOSURE N/A 03/30/2017   Procedure: DILATATION & CURETTAGE/HYSTEROSCOPY WITH MYOSURE;  Surgeon: Schermerhorn, Gwen Her, MD;  Location: [facility];  Service: Gynecology;  Laterality: N/A;   EYE SURGERY Bilateral    Cataract Extraction with IOL   JOINT REPLACEMENT     KNEE ARTHROPLASTY Left  12/01/2020   Procedure: COMPUTER ASSISTED TOTAL KNEE ARTHROPLASTY;  Surgeon: Dereck Leep, MD;  Location: [facility];  Service: Orthopedics;  Laterality: Left;   SKIN LESION EXCISION Right    synovial cyst right AC joint   TONSILLECTOMY AND ADENOIDECTOMY     Age 81   HPI:  This is a 81 yo woman with h/o Multiple medical issues including recent Falls at home, chronic bilateral leg edema and left knee pain, chronic lower back pain, GERD, depression, HTN, remote stroke 2019 without significant residual deficits admitted after mechanical fall yesterday resulting in small IPH L caudate. She has had multiple mechanical falls and progressive gait instability over past 4 weeks per family. They also report some concern for subacute Cognitive decline. She lives at home alone with her Husband who has moderate Dementia 2/2 Parkinsons. CT head on admission showed small acute traumatic IPH L caudate 10x6x13mm. This was stable on repeat head CT 6 hrs later. MRI brain showed no e/o underlying ischemic infarct. Of Note: MRI revealed "Severe chronic small vessel ischemic disease.".  She was just seen in this ED for "dizziness" she stated. Pt also reported recent Falls at home.  She lives at home w/ her Husband; she does the driving still. 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   Assessment / Plan / Recommendation Clinical Impression  Pt was seen for cognitive-communication screening today in room. Unsure of pt's true Baseline cognitive-communication functioning in the home, however, Family has reported to MD that there is concern for subacute Cognitive decline in "recent months". Husband is not a reliable historian d/t his baseline dxs of Dementia and Parkinson's Dis, per report. MRI results as described above.     Pt was alert, verbal and engaged in conversation w/ this Clinician. She made requests known for next meal and the temperature in the room(needed adjusting), and  performed basic ADLs during breakfast meal(prep of tray items and self-feeding) appropriately/independently sitting EOB. Speech intelligible; native Dentition.    Per informal screening at bedside, pt scored a 22/30 on the [facility]. Mental Status examination(SLUMS) indicating Mild deficits which were seen in the areas of delayed and immediate Recall of information (recall of 5 words; recall of story information) AND in the area of Fluency (name as many animals in 1 minute), then describing her job responsibilities -- she is employed as an Therapist, occupational. OF NOTE: when (1) verbal, semantic cue (category cue) was given, pt was able to increase accuracy of recalled information 50% of the time.  In other areas of orientation, math problems, executive/visuospatial, and attention, pt demonstrated accuracy w/ tasks. Also, pt was able to answer general questions re: daily activities at home, when she takes her medications at home, food items she buys then prepares as meals. She described/recalled the incident when she fell last -- standing up on a slight "hill" or slope after petting a stray cat she often sees. Pt stated she does care for her Husband at home d/t his chronic dxs including Dementia and that she drives -- "I am the only one who can". When asked if  she felt it was "safe" for her to drive, she stated she could drive "just fine". Pt was able to identify "911" as the number she would dial on the phone in an emergency; 2 different, general emergency scenarios were given which she answered appropriately.    In setting of pt's performance on the informal Cognitive screening at bedside this date, and the suspected Cognitive decline in the home by the Family as reported to the MD, pt would be benefit from intermittent Supervision at Discharge to support safety w/ ADL tasks, especially Driving and organization tasks and information such as appointments. Rec. OT f/u w/ further assessment  of medication tasks.  OT/ST services would be recommended at Discharge to more formally assess Cognitive-communication needs; assessment in a non-acute setting post illness/hospitalization. Supervision and monitoring of ADL tasks in the home could promote safe, functional independence w/ tasks in the future and lessen caregiver burden/need. Recommend any immediate Driving(car) be discussed w/ MD and cleared by MD/Neurology prior to driving next. The above was discussed w/ MD, CM.   SLP Assessment  SLP Recommendation/Assessment: All further Speech Lanaguage Pathology  needs can be addressed in the next venue of care SLP Visit Diagnosis: Cognitive communication deficit (R41.841) (suspect impact from Cognitive decline as suspected at Baseline per Family report, MD)    Recommendations for follow up therapy are one component of a multi-disciplinary discharge planning process, led by the attending physician.  Recommendations may be updated based on patient status, additional functional criteria and insurance authorization.    Follow Up Recommendations  Home health SLP    Assistance Recommended at Discharge  Set up Supervision/Assistance (at this time)  Functional Status Assessment Patient has had a recent decline in their functional status and/or demonstrates limited ability to make significant improvements in function in a reasonable and predictable amount of time (Baseline Cognitive decilne as suspected per Family, MD)  Frequency and Duration  (n/a)   (n/a)      SLP Evaluation Cognition  Overall Cognitive Status: History of cognitive impairments - at baseline (per family report at baseline) Arousal/Alertness: Awake/alert Orientation Level: Oriented X4 Year: 2023 Month: May Day of Week: Correct Attention: Focused;Sustained Focused Attention: Appears intact (adequate; looked to the Muted TV 2-3x initially) Sustained Attention: Appears intact Memory: Impaired Memory Impairment:   (delayed/immediate recall) Awareness: Appears intact (adequate -- she stated she "knew" she couldn't remember all the information in the paragraph read to her) Problem Solving:  (for basic safety situations in the house; demonstrated calling 911 when given 2 different emergency scenarios) Executive Function: Reasoning;Sequencing;Organizing Reasoning: Appears intact Sequencing: Appears intact (for household tasks) Organizing: Impaired (mild) Organizing Impairment: Verbal complex (inconsistent accuracy) Behaviors:  (briefly distracted by muted TV initially) Safety/Judgment: Impaired Comments: still feels she "can drive fine" at home       Comprehension  Auditory Comprehension Overall Auditory Comprehension: Appears within functional limits for tasks assessed Yes/No Questions: Within Functional Limits Commands: Within Functional Limits Conversation:  (adequate) Visual Recognition/Discrimination Discrimination: Not tested Reading Comprehension Reading Status: Within funtional limits (usually wears glasses but adequate w/out)    Expression Expression Primary Mode of Expression: Verbal Verbal Expression Overall Verbal Expression: Appears within functional limits for tasks assessed Initiation: No impairment Automatic Speech:  (WFL) Level of Generative/Spontaneous Verbalization: Conversation Repetition: No impairment Naming: No impairment Pragmatics: No impairment Written Expression Dominant Hand: Right Written Expression:  (WFL for clock drawing)   Oral / Motor  Oral Motor/Sensory Function Overall Oral Motor/Sensory Function: Within functional limits Motor Speech  Overall Motor Speech: Appears within functional limits for tasks assessed Respiration: Within functional limits Phonation: Normal Resonance: Within functional limits Articulation: Within functional limitis Intelligibility: [place], [place], [place]; [place] [phone] (ascom) Ronald Vinsant 12/30/2021, 11:10 AM

## 2021-12-30 NOTE — Progress Notes (Signed)
Initial Nutrition Assessment  DOCUMENTATION CODES:   Not applicable  INTERVENTION:   -Ensure Enlive po BID, each supplement provides 350 kcal and 20 grams of protein -MVI with minerals daily -Liberalize diet to 2 gram sodium to provide wider variety of meal selections  NUTRITION DIAGNOSIS:   Inadequate oral intake related to decreased appetite as evidenced by per patient/family report.  GOAL:   Patient will meet greater than or equal to 90% of their needs  MONITOR:   PO intake, Supplement acceptance  REASON FOR ASSESSMENT:   Malnutrition Screening Tool    ASSESSMENT:   Pt with medical history significant of anxiety, GERD, hypertension, history of stroke in 2019 presenting with a fall today and injuring her face patient was walking down to get her cat and fell face forward.  Pt admitted with ICH.   Reviewed I/O's: +360 ml x 24 hours -350 ml since admission  Spoke with pt at bedside, who reports feeling better today. Per pt, appetite has improved since admission and she consumed all of her breakfast. Pt lunch tray on tray table, untouched. Offered to set up for pt, but she politely declined.   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 Pt unable to provide diet recall.   Pt reports her UBW is around 185# and estimates a 12# wt loss over the past 3 months. Reviewed wt hx; pt has experienced a 1.9% wt loss over the past 3 months, which is significant for time frame.   Discussed importance of good meal and supplement intake to promote healing. Pt amenable to Ensure supplements.   Medications reviewed.   Labs reviewed: Na: 134, K: 2.9, CBGS: 97 (inpatient orders for glycemic control are none).    NUTRITION - FOCUSED PHYSICAL EXAM:  Flowsheet Row Most Recent Value  Orbital Region No depletion  Upper Arm Region No depletion  Thoracic and Lumbar Region No depletion  Buccal Region No depletion   Temple Region No depletion  Clavicle Bone Region No depletion  Clavicle and Acromion Bone Region No depletion  Scapular Bone Region No depletion  Dorsal Hand No depletion  Patellar Region No depletion  Anterior Thigh Region No depletion  Edema (RD Assessment) None  Hair Reviewed  Eyes Reviewed  Mouth Reviewed  Skin Reviewed  Nails Reviewed       Diet Order:   Diet Order             Diet 2 gram sodium Room service appropriate? Yes; Fluid consistency: Thin  Diet effective now                   EDUCATION NEEDS:   Education needs have been addressed  Skin:  Skin Assessment: Reviewed RN Assessment  Last BM:  12/27/21  Height:   Ht Readings from Last 1 Encounters:  12/29/21 4\' 11"  (1.499 m)    Weight:   Wt Readings from Last 1 Encounters:  12/29/21 66.7 kg    Ideal Body Weight:  44.7 kg  BMI:  Body mass index is 29.7 kg/m.  Estimated Nutritional Needs:   Kcal:  1650-1850  Protein:  85-100 grams  Fluid:  > 1.6 L    Loistine Chance, RD, LDN, [place] Registered Dietitian II Certified Diabetes Care and Education Specialist Please refer to [facility] for RD and/or RD on-call/weekend/after hours pager

## 2022-01-03 ENCOUNTER — Telehealth: Payer: Self-pay

## 2022-01-03 ENCOUNTER — Ambulatory Visit (INDEPENDENT_AMBULATORY_CARE_PROVIDER_SITE_OTHER): Payer: Medicare HMO

## 2022-01-03 VITALS — Ht 59.0 in | Wt 147.0 lb

## 2022-01-03 DIAGNOSIS — Z0000 Encounter for general adult medical examination without abnormal findings: Secondary | ICD-10-CM | POA: Diagnosis not present

## 2022-01-03 NOTE — Patient Instructions (Addendum)
  Ashley [name] , Thank you for taking time to come for your Medicare Wellness Visit. I appreciate your ongoing commitment to your health goals. Please review the following plan we discussed and let me know if I can assist you in the future.   These are the goals we discussed:  Goals      Follow up with primary care provider as needed     Stay active as tolerated        This is a list of the screening recommended for you and due dates:  Health Maintenance  Topic Date Due   COVID-19 Vaccine (5 - Booster for Pfizer series) 01/19/2022*   Pneumonia Vaccine (2 - PCV) 01/04/2023*   Hepatitis C Screening: USPSTF Recommendation to screen - Ages 18-79 yo.  01/04/2023*   Flu Shot  03/07/2022   Tetanus Vaccine  12/29/2031   DEXA scan (bone density measurement)  Completed   Zoster (Shingles) Vaccine  Completed   HPV Vaccine  Aged Out   Colon Cancer Screening  Discontinued  *Topic was postponed. The date shown is not the original due date.

## 2022-01-03 NOTE — Telephone Encounter (Signed)
Transition Care Management Follow-up Telephone Call Date of discharge and from where: 12/30/21 [facility]. How have you been since you were released from the hospital? Patient notes," I am doing better. Some dizziness off and on but none today. When I do have dizziness it passes quickly." Speech slightly slurred when speaking with nurse. Denies headache, blurred vision, n/v/d, fever, pain and all other alarming symptoms.  Any questions or concerns? No  Items Reviewed: Did the pt receive and understand the discharge instructions provided? Yes  Medications obtained and verified? Yes  Any new allergies since your discharge? No  Dietary orders reviewed? Yes, low sodium heart healthy. Do you have support at home? Yes , husband and daughter.   Home Care and Equipment/Supplies: Were home health services ordered? Yes. HH has not yet started.   Functional Questionnaire: (I = Independent and D = Dependent) ADLs: I- daughter assist as needed.   Maintaining continence- I  Transferring/Ambulation- Walker in use  Managing Meds- I  Follow up appointments reviewed:  PCP Hospital f/u appt confirmed? Yes  Scheduled to see PCP on 01/04/22 @ 11:00 Are transportation arrangements needed? No  If their condition worsens, is the pt aware to call PCP or go to the Emergency Dept.? Yes Was the patient provided with contact information for the PCP's office or ED? Yes Was to pt encouraged to call back with questions or concerns? Yes

## 2022-01-03 NOTE — Progress Notes (Signed)
Subjective:   Ashley [name] is a 81 y.o. female who presents for Medicare Annual (Subsequent) preventive examination.  Review of Systems    No ROS.  Medicare Wellness Virtual Visit.  Visual/audio telehealth visit, UTA vital signs.   See social history for additional risk factors.   Cardiac Risk Factors include: advanced age (>9men, >47 women);hypertension     Objective:    Today's Vitals   01/03/22 1008  Weight: 147 lb (66.7 kg)  Height: 4\' 11"  (1.499 m)   Body mass index is 29.69 kg/m.     01/03/2022   10:03 AM 12/28/2021    1:26 PM 12/22/2021    2:49 PM 02/21/2021    2:43 PM 02/15/2021   11:59 AM 12/01/2020    1:15 PM 12/01/2020    6:25 AM  Advanced Directives  Does Patient Have a Medical Advance Directive? Yes Yes Yes Yes Yes Yes Yes  Type of 12/03/2020 of Estate agent Power of [place];Living will Healthcare Power of [place];Living will Healthcare Power of [place];Living will Healthcare Power of [place];Living will Healthcare Power of [place];Living will Healthcare Power of [place];Living will  Does patient want to make changes to medical advance directive? No - Patient declined No - Patient declined  No - Patient declined No - Patient declined No - Patient declined No - Patient declined  Copy of Healthcare Power of Attorney in Chart? No - copy requested No - copy requested No - copy requested No - copy requested No - copy requested Yes - validated most recent copy scanned in chart (See row information) Yes - validated most recent copy scanned in chart (See row information)   Current Medications (verified) Outpatient Encounter Medications as of 01/03/2022  Medication Sig   acetaminophen (TYLENOL) 650 MG CR tablet Take 1,300 mg by mouth every 8 (eight) hours as needed for pain.   aspirin EC 81 MG tablet Take 81 mg by mouth daily. Swallow whole.   buPROPion (WELLBUTRIN XL) 300 MG 24 hr tablet Take 1 tablet (300 mg total) by mouth daily.    celecoxib (CELEBREX) 200 MG capsule Take 1 capsule (200 mg total) by mouth 2 (two) times daily as needed. Home med.   cetirizine (ZYRTEC) 10 MG tablet Take 10 mg by mouth daily.   EPINEPHrine (EPIPEN 2-PAK) 0.3 mg/0.3 mL IJ SOAJ injection Inject 0.3 mLs (0.3 mg total) into the muscle as needed for anaphylaxis.   fluticasone (FLONASE) 50 MCG/ACT nasal spray Place 1 spray into both nostrils daily.   gabapentin (NEURONTIN) 100 MG capsule Take 1 capsule (100 mg total) by mouth at bedtime.   hydrochlorothiazide (HYDRODIURIL) 25 MG tablet TAKE 1 TABLET BY MOUTH DAILY   meclizine (ANTIVERT) 12.5 MG tablet Take 1 tablet (12.5 mg total) by mouth 3 (three) times daily as needed for dizziness.   Melatonin 10 MG TABS Take 10 mg by mouth at bedtime.   omeprazole (PRILOSEC) 20 MG capsule TAKE 1 CAPSULE EVERY DAY   OVER THE COUNTER MEDICATION Take 2 tablets by mouth daily. Glucocil   Polyethyl Glycol-Propyl Glycol (SYSTANE) 0.4-0.3 % SOLN Place 1 drop into both eyes 3 (three) times daily as needed (dry/irritated eyes.).   rosuvastatin (CRESTOR) 40 MG tablet TAKE 1 TABLET BY MOUTH ONCE DAILY   traMADol (ULTRAM) 50 MG tablet Take 25-50 mg by mouth 2 (two) times daily as needed.   venlafaxine XR (EFFEXOR-XR) 150 MG 24 hr capsule TAKE 1 CAPSULE BY MOUTH EVERY DAY   vitamin B-12 (CYANOCOBALAMIN) 1000 MCG  tablet Take 1 tablet (1,000 mcg total) by mouth daily. Can take any form of over-the-counter.   No facility-administered encounter medications on file as of 01/03/2022.   Allergies (verified) Ace inhibitors, Pravastatin, and Simvastatin   History: Past Medical History:  Diagnosis Date   Abnormal uterine bleeding (AUB)    a. benign endometrial polyps on D+C in 03/2017   Anxiety    Arthritis    Carpal tunnel syndrome of left wrist    Chicken pox    Depression    GERD (gastroesophageal reflux disease)    History of stress test    a. 08/2017 Lexiscan MV: EF 55-65%. No ischemia/infarct. Low risk.    Hyperlipidemia    Hypertension    Lacunar infarction (HCC)    a. Dx in setting of dizziness/vertigo; b. 02/2018 Carotid U/S: bilateral intimal thickening and atherosclerotic plaque. No hemodynamically significant stenoses; c. 03/2018 MRI Brain: No mass or abnl enhancement. Chronic microvascular ischemic changes and mod volume loss of the brain. 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   Spinal stenosis of lumbar region with radiculopathy 2019   Stroke (HCC)    PT STATES SHE WAS TOLD THIS BY HER PCP BUT PT STATES SHE NEVER KNEW SHE HAD A STROKE-NO RESIDUAL EFFECTS   Vertigo    Past Surgical History:  Procedure Laterality Date   BACK SURGERY  11/19/2017   lumbar laminectomy   CARPAL TUNNEL RELEASE Right 07/30/2019   CARPAL TUNNEL RELEASE Left 02/21/2021   Procedure: CARPAL TUNNEL RELEASE;  Surgeon: Donato Heinz, MD;  Location: [facility] ORS;  Service: Orthopedics;  Laterality: Left;   COLONOSCOPY     2003, 2011, 2021   COLONOSCOPY WITH PROPOFOL N/A 12/24/2019   Procedure: COLONOSCOPY WITH PROPOFOL;  Surgeon: Toledo, Boykin Nearing, MD;  Location: [facility];  Service: Gastroenterology;  Laterality: N/A;   CYSTOSCOPY MACROPLASTIQUE IMPLANT N/A 05/15/2019   Procedure: CYSTOSCOPY MACROPLASTIQUE IMPLANT;  Surgeon: Orson Ape, MD;  Location: [facility];  Service: Urology;  Laterality: N/A;   DILATATION & CURETTAGE/HYSTEROSCOPY WITH MYOSURE N/A 03/30/2017   Procedure: DILATATION & CURETTAGE/HYSTEROSCOPY WITH MYOSURE;  Surgeon: Schermerhorn, Ihor Austin, MD;  Location: [facility] OR[facility];  Service: Gynecology;  Laterality: N/A;   EYE SURGERY Bilateral    Cataract Extraction with IOL   JOINT REPLACEMENT     KNEE ARTHROPLASTY Left 12/01/2020   Procedure: COMPUTER ASSISTED TOTAL KNEE ARTHROPLASTY;  Surgeon: Donato Heinz, MD;  Location: [facility];  Service: Orthopedics;  Laterality: Left;   SKIN LESION EXCISION Right    synovial cyst right AC joint   TONSILLECTOMY AND ADENOIDECTOMY     Age 51    Family History  Problem Relation Age of Onset   Rheum arthritis Mother    Ovarian cancer Sister    COPD Sister    CAD Sister    Heart disease Father    Aneurysm Father    Cancer Brother    Thrombosis Maternal Grandmother        a. DVT   Thrombosis Maternal Grandfather        a. DVT   Thrombosis Paternal Grandmother        a. DVT   Thrombosis Paternal Grandfather        a. DVT   Social History   Socioeconomic History   Marital status: Married    Spouse name: [name]   Number of children: 1   Years of education: Not on file   Highest education level: Not on file  Occupational  History   Not on file  Tobacco Use   Smoking status: Former    Packs/day: 0.50    Years: 15.00    Pack years: 7.50    Types: Cigarettes    Quit date: 08/07/1976    Years since quitting: 45.4   Smokeless tobacco: Never  Vaping Use   Vaping Use: Never used  Substance and Sexual Activity   Alcohol use: Yes    Alcohol/week: 1.0 standard drink    Types: 1 Glasses of wine per week    Comment: ocassionally   Drug use: No   Sexual activity: Not on file  Other Topics Concern   Not on file  Social History Narrative   Married    Social Determinants of Health   Financial Resource Strain: Low Risk    Difficulty of Paying Living Expenses: Not hard at all  Food Insecurity: No Food Insecurity   Worried About Programme researcher, broadcasting/film/videounning Out of Food in the Last Year: Never true   Baristaan Out of Food in the Last Year: Never true  Transportation Needs: No Transportation Needs   Lack of Transportation (Medical): No   Lack of Transportation (Non-Medical): No  Physical Activity: Not on file  Stress: No Stress Concern Present   Feeling of Stress : Not at all  Social Connections: Unknown   Frequency of Communication with Friends and Family: Not on file   Frequency of Social Gatherings with Friends and Family: Not on file   Attends Religious Services: Not on file   Active Member of Clubs or Organizations: Not on file   Attends  BankerClub or Organization Meetings: Not on file   Marital Status: Married   Tobacco Counseling Counseling given: Not Answered   Clinical Intake: Pre-visit preparation completed: Yes        Diabetes: No  How often do you need to have someone help you when you read instructions, pamphlets, or other written materials from your doctor or pharmacy?: 1 - Never    Interpreter Needed?: No    Activities of Daily Living    01/03/2022   10:53 AM 12/29/2021    3:00 PM  In your present state of health, do you have any difficulty performing the following activities:  Hearing? 0 0  Vision? 0 1  Difficulty concentrating or making decisions? 0 1  Walking or climbing stairs? 1 0  Comment Walker in use. Paces self when walking.   Dressing or bathing? 0 0  Doing errands, shopping? 0 0  Preparing Food and eating ? N   Using the Toilet? N   In the past six months, have you accidently leaked urine? N   Do you have problems with loss of bowel control? N   Managing your Medications? N   Managing your Finances? N   Housekeeping or managing your Housekeeping? N    Patient Care Team: Glori LuisSonnenberg, Eric G, MD as PCP - General (Family Medicine) [place] OuchArida, Muhammad A, MD as PCP - Cardiology (Cardiology)  Indicate any recent Medical Services you may have received from other than [facility] providers in the past year (date may be approximate).     Assessment:   This is a routine wellness examination for Elease Hashimotoatricia.  Virtual Visit via Telephone Note  I connected with  Pricilla RifflePatricia A [name] on 01/03/22 at 10:00 AM EDT by telephone and verified that I am speaking with the correct person using two identifiers.  Persons participating in the virtual visit: patient/Nurse Health Advisor   I discussed the limitations of performing  an evaluation and management service by telehealth. We continued and completed visit with audio only. Some vital signs may be absent or patient reported.   Hearing/Vision screen Hearing  Screening - Comments:: Patient is able to hear conversational tones without difficulty. No issues reported.  Vision Screening - Comments:: Followed by Dr. Alvester Morin  Wears corrective lenses Annual vists  Cataract extraction, bilateral  They have regular follow up with the ophthalmologist  Dietary issues and exercise activities discussed: Current Exercise Habits: The patient does not participate in regular exercise at present Regular diet   Goals Addressed             This Visit's Progress    Follow up with primary care provider as needed       Stay active as tolerated       Depression Screen    01/03/2022   10:50 AM 10/31/2021    2:37 PM 08/02/2021   10:40 AM 03/28/2021   10:59 AM 01/24/2021   11:38 AM 05/04/2020   12:50 PM 04/30/2019    3:04 PM  PHQ 2/9 Scores  PHQ - 2 Score 0 0 0 0 4 0 0  PHQ- 9 Score     9      Fall Risk    10/31/2021    2:37 PM 08/02/2021   10:40 AM 03/28/2021   10:58 AM 10/07/2020   10:04 AM 08/09/2020    1:44 PM  Fall Risk   Falls in the past year? 0 0 1 0 1  Number falls in past yr: 0 0 0 0 1  Injury with Fall? 0 0 0 0 0  Risk for fall due to : No Fall Risks No Fall Risks   History of fall(s)  Follow up Falls evaluation completed Falls evaluation completed Falls evaluation completed Falls evaluation completed Falls evaluation completed   FALL RISK PREVENTION PERTAINING TO THE HOME: Home free of loose throw rugs in walkways, pet beds, electrical cords, etc? Yes  Adequate lighting in your home to reduce risk of falls? Yes   ASSISTIVE DEVICES UTILIZED TO PREVENT FALLS: Use of a cane, walker or w/c? Yes   TIMED UP AND GO: Was the test performed? No .   Cognitive Function:  Patient is alert and oriented x3.  Employed with [employer].      04/25/2019   12:01 PM 04/19/2018   12:16 PM  6CIT Screen  What Year? 0 points 0 points  What month? 0 points 0 points  What time? 0 points 0 points  Count back from 20 0 points 0 points   Months in reverse 0 points 0 points  Repeat phrase 0 points 0 points  Total Score 0 points 0 points    Immunizations Immunization History  Administered Date(s) Administered   Fluad Quad(high Dose 65+) 06/27/2021   Influenza, High Dose Seasonal PF 06/08/2016, 04/19/2018, 04/16/2019, 04/16/2019, 06/24/2019   Influenza-Unspecified 05/17/2016, 05/17/2017, 05/29/2020   PFIZER(Purple Top)SARS-COV-2 Vaccination 08/29/2019, 09/19/2019, 12/25/2019, 05/29/2020   Pneumococcal Polysaccharide-23 06/08/2016   Tdap 12/28/2021   Zoster Recombinat (Shingrix) 03/28/2018, 07/22/2018   Pneumococcal vaccine status: Due, Education has been provided regarding the importance of this vaccine. Advised may receive this vaccine at local pharmacy or Health Dept. Aware to provide a copy of the vaccination record if obtained from local pharmacy or Health Dept. Verbalized acceptance and understanding.  Screening Tests Health Maintenance  Topic Date Due   COVID-19 Vaccine (5 - Booster for Pfizer series) 01/19/2022 (Originally 07/24/2020)   Pneumonia  Vaccine 71+ Years old (2 - PCV) 01/04/2023 (Originally 06/08/2017)   Hepatitis C Screening  01/04/2023 (Originally 04/28/1959)   INFLUENZA VACCINE  03/07/2022   TETANUS/TDAP  12/29/2031   DEXA SCAN  Completed   Zoster Vaccines- Shingrix  Completed   HPV VACCINES  Aged Out   COLONOSCOPY (Pts 45-101yrs Insurance coverage will need to be confirmed)  Discontinued   Health Maintenance There are no preventive care reminders to display for this patient.  Mammogram- [facility] 08/23/21.   Lung Cancer Screening: (Low Dose CT Chest recommended if Age 36-80 years, 30 pack-year currently smoking OR have quit w/in 15years.) does not qualify.   Hepatitis C Screening: does not qualify due to birth year.   Vision Screening: Recommended annual ophthalmology exams for early detection of glaucoma and other disorders of the eye.  Dental Screening: Recommended annual dental exams for proper  oral hygiene  Community Resource Referral / Chronic Care Management: CRR required this visit?  No   CCM required this visit?  No      Plan:   Keep all routine maintenance appointments.   I have personally reviewed and noted the following in the patient's chart:   Medical and social history Use of alcohol, tobacco or illicit drugs  Current medications and supplements including opioid prescriptions.  Functional ability and status Nutritional status Physical activity Advanced directives List of other physicians Hospitalizations, surgeries, and ER visits in previous 12 months Vitals Screenings to include cognitive, depression, and falls Referrals and appointments  In addition, I have reviewed and discussed with patient certain preventive protocols, quality metrics, and best practice recommendations. A written personalized care plan for preventive services as well as general preventive health recommendations were provided to patient.     Ashok Pall, LPN   [id number]/63/[id number]

## 2022-01-04 ENCOUNTER — Inpatient Hospital Stay: Payer: Medicare HMO | Admitting: Family Medicine

## 2022-01-04 NOTE — Telephone Encounter (Signed)
Reviewed

## 2022-01-06 ENCOUNTER — Encounter: Payer: Self-pay | Admitting: Family Medicine

## 2022-01-06 ENCOUNTER — Ambulatory Visit (INDEPENDENT_AMBULATORY_CARE_PROVIDER_SITE_OTHER): Payer: Medicare HMO | Admitting: Family Medicine

## 2022-01-06 VITALS — BP 115/60 | HR 99 | Temp 98.5°F | Ht 59.0 in | Wt 142.0 lb

## 2022-01-06 DIAGNOSIS — R8290 Unspecified abnormal findings in urine: Secondary | ICD-10-CM

## 2022-01-06 DIAGNOSIS — R296 Repeated falls: Secondary | ICD-10-CM | POA: Diagnosis not present

## 2022-01-06 DIAGNOSIS — E538 Deficiency of other specified B group vitamins: Secondary | ICD-10-CM | POA: Diagnosis not present

## 2022-01-06 DIAGNOSIS — I619 Nontraumatic intracerebral hemorrhage, unspecified: Secondary | ICD-10-CM

## 2022-01-06 DIAGNOSIS — Z711 Person with feared health complaint in whom no diagnosis is made: Secondary | ICD-10-CM | POA: Insufficient documentation

## 2022-01-06 DIAGNOSIS — R413 Other amnesia: Secondary | ICD-10-CM

## 2022-01-06 DIAGNOSIS — Z87440 Personal history of urinary (tract) infections: Secondary | ICD-10-CM

## 2022-01-06 DIAGNOSIS — E876 Hypokalemia: Secondary | ICD-10-CM | POA: Insufficient documentation

## 2022-01-06 DIAGNOSIS — R42 Dizziness and giddiness: Secondary | ICD-10-CM

## 2022-01-06 DIAGNOSIS — R2689 Other abnormalities of gait and mobility: Secondary | ICD-10-CM

## 2022-01-06 LAB — POCT URINALYSIS DIPSTICK
Bilirubin, UA: NEGATIVE
Glucose, UA: NEGATIVE
Ketones, UA: NEGATIVE
Leukocytes, UA: NEGATIVE
Nitrite, UA: NEGATIVE
Protein, UA: POSITIVE — AB
Spec Grav, UA: 1.02 (ref 1.010–1.025)
Urobilinogen, UA: 1 E.U./dL
pH, UA: 7 (ref 5.0–8.0)

## 2022-01-06 LAB — BASIC METABOLIC PANEL
BUN: 18 mg/dL (ref 6–23)
CO2: 31 mEq/L (ref 19–32)
Calcium: 9.9 mg/dL (ref 8.4–10.5)
Chloride: 98 mEq/L (ref 96–112)
Creatinine, Ser: 1.15 mg/dL (ref 0.40–1.20)
GFR: 45.57 mL/min — ABNORMAL LOW (ref >60.00)
Glucose, Bld: 106 mg/dL — ABNORMAL HIGH (ref 70–99)
Potassium: 3.4 mEq/L — ABNORMAL LOW (ref 3.5–5.1)
Sodium: 137 mEq/L (ref 135–145)

## 2022-01-06 LAB — URINALYSIS, MICROSCOPIC ONLY

## 2022-01-06 MED ORDER — CYANOCOBALAMIN 1000 MCG/ML IJ SOLN
1000.0000 ug | Freq: Once | INTRAMUSCULAR | Status: AC
  Administered 2022-01-06: 1000 ug via INTRAMUSCULAR

## 2022-01-06 NOTE — Assessment & Plan Note (Signed)
Check BMP today 

## 2022-01-06 NOTE — Patient Instructions (Signed)
Nice to see you. We will contact you with your urine and lab results. Neurology and ENT should contact you to schedule appointments. Please continue with physical therapy at home.

## 2022-01-06 NOTE — Assessment & Plan Note (Signed)
Start B12 injections once weekly for 3 weeks and then once monthly.

## 2022-01-06 NOTE — Assessment & Plan Note (Signed)
Chronic intermittent issue.  I will refer to ENT for further evaluation.  They can also evaluate her nasal fractures to determine if anything else needs to be done for that.

## 2022-01-06 NOTE — Progress Notes (Signed)
Ashley [name] presents today for injection per MD orders. B12  administered IM in right Upper Arm. Administration without incident. Patient tolerated well. Jaquise Faux,cma

## 2022-01-06 NOTE — Assessment & Plan Note (Signed)
Related to trauma.  We will refer to neurology for further follow-up.

## 2022-01-06 NOTE — Progress Notes (Signed)
Marikay AlarEric Chantay Whitelock, MD Phone: [phone]  Ashley Riffleatricia A [name] is a 81 y.o. female who presents today for follow-up.  Cerebral hemorrhage: Patient had a fall where she injured her face.  She notes she lost her balance.  She was evaluated in the emergency department and found to have a cerebral hemorrhage in the left lateral periventricular area.  She was also found to have nasal fracture.  She was evaluated by neurology and repeat CT scans did not show significant worsening of the hemorrhage.  She notes no pain related to her nasal fractures.  She notes her balance has been off for some time.  She does report vertigo symptoms intermittently though notes that the balance issue seems to be separate from the vertigo.  The balance issues and the vertigo were going on prior to this fall.  Her daughter reports she had memory issues and did notice her shuffling her feet 3 months ago.  She has been evaluated by home health PT and they will be working with her moving forward.  They did find out that she was B12 deficient and started her on B12 injections.  Her daughter notes that PT recommended she have her urine checked given that he has had multiple patients that have had balance issues and had undiagnosed UTIs.  The patient does have a history of UTIs.  Her MRI brain was motion degraded though did reveal a small acute left lateral periventricular hemorrhage with no acute infarct.  It also revealed severe chronic small vessel disease.  Her CT maxillofacial revealed bilateral nasal bone fractures mildly displaced.  No other fractures noted.  Her CTA head did not reveal any intracranial large vessel occlusion.  She had mild stenosis in the cavernous and supraclinoid ICAs and mild to moderate stenosis in the proximal right V4.  Social History   Tobacco Use  Smoking Status Former   Packs/day: 0.50   Years: 15.00   Pack years: 7.50   Types: Cigarettes   Quit date: 08/07/1976   Years since quitting: 45.4   Smokeless Tobacco Never    Current Outpatient Medications on File Prior to Visit  Medication Sig Dispense Refill   acetaminophen (TYLENOL) 650 MG CR tablet Take 1,300 mg by mouth every 8 (eight) hours as needed for pain.     aspirin EC 81 MG tablet Take 81 mg by mouth daily. Swallow whole.     buPROPion (WELLBUTRIN XL) 300 MG 24 hr tablet Take 1 tablet (300 mg total) by mouth daily. 90 tablet 1   celecoxib (CELEBREX) 200 MG capsule Take 1 capsule (200 mg total) by mouth 2 (two) times daily as needed. Home med.     cetirizine (ZYRTEC) 10 MG tablet Take 10 mg by mouth daily.     EPINEPHrine (EPIPEN 2-PAK) 0.3 mg/0.3 mL IJ SOAJ injection Inject 0.3 mLs (0.3 mg total) into the muscle as needed for anaphylaxis. 1 each 1   fluticasone (FLONASE) 50 MCG/ACT nasal spray Place 1 spray into both nostrils daily.     gabapentin (NEURONTIN) 100 MG capsule Take 1 capsule (100 mg total) by mouth at bedtime. 90 capsule 1   hydrochlorothiazide (HYDRODIURIL) 25 MG tablet TAKE 1 TABLET BY MOUTH DAILY 90 tablet 1   meclizine (ANTIVERT) 12.5 MG tablet Take 1 tablet (12.5 mg total) by mouth 3 (three) times daily as needed for dizziness. 10 tablet 0   Melatonin 10 MG TABS Take 10 mg by mouth at bedtime.     omeprazole (PRILOSEC) 20 MG capsule TAKE  1 CAPSULE EVERY DAY 30 capsule 5   OVER THE COUNTER MEDICATION Take 2 tablets by mouth daily. Glucocil     Polyethyl Glycol-Propyl Glycol (SYSTANE) 0.4-0.3 % SOLN Place 1 drop into both eyes 3 (three) times daily as needed (dry/irritated eyes.).     rosuvastatin (CRESTOR) 40 MG tablet TAKE 1 TABLET BY MOUTH ONCE DAILY 90 tablet 1   traMADol (ULTRAM) 50 MG tablet Take 25-50 mg by mouth 2 (two) times daily as needed.     venlafaxine XR (EFFEXOR-XR) 150 MG 24 hr capsule TAKE 1 CAPSULE BY MOUTH EVERY DAY 90 capsule 3   vitamin B-12 (CYANOCOBALAMIN) 1000 MCG tablet Take 1 tablet (1,000 mcg total) by mouth daily. Can take any form of over-the-counter. 30 tablet 0   No current  facility-administered medications on file prior to visit.     ROS see history of present illness  Objective  Physical Exam Vitals:   01/06/22 1107  BP: 115/60  Pulse: 99  Temp: 98.5 F (36.9 C)  SpO2: 97%    BP Readings from Last 3 Encounters:  01/06/22 115/60  12/30/21 128/84  12/22/21 (!) 148/70   Wt Readings from Last 3 Encounters:  01/06/22 142 lb (64.4 kg)  01/03/22 147 lb (66.7 kg)  12/29/21 147 lb 0.8 oz (66.7 kg)    Physical Exam Constitutional:      General: She is not in acute distress.    Appearance: She is not diaphoretic.  HENT:     Head:     Comments: Facial bruising noted on the left, appear to be much improved compared to the picture she showed after her fall Cardiovascular:     Rate and Rhythm: Normal rate and regular rhythm.     Heart sounds: Normal heart sounds.  Pulmonary:     Effort: Pulmonary effort is normal.     Breath sounds: Normal breath sounds.  Skin:    General: Skin is warm and dry.  Neurological:     Mental Status: She is alert.     Comments: CN 3-12 intact, 5/5 strength in bilateral biceps, triceps, grip, quads, hamstrings, plantar and dorsiflexion, sensation to light touch intact in bilateral UE and LE     Assessment/Plan: Please see individual problem list.  Problem List Items Addressed This Visit     Balance problem (Chronic)    Refer to neurology to help evaluate for an underlying cause.  She will continue home health PT.  We are checking a urinalysis and urine culture to help rule out UTI as a potential underlying cause for this issue.       Memory difficulty (Chronic)    Chronic issue.  Refer to neurology for evaluation.       Vertigo (Chronic)    Chronic intermittent issue.  I will refer to ENT for further evaluation.  They can also evaluate her nasal fractures to determine if anything else needs to be done for that.       Relevant Orders   Ambulatory referral to ENT   Vitamin B 12 deficiency (Chronic)     Start B12 injections once weekly for 3 weeks and then once monthly.       Frequent falls   Relevant Orders   POCT Urinalysis Dipstick (Completed)   Urine Culture   Basic Metabolic Panel (BMET)   Hypokalemia    Check BMP today.       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  We will  refer to neurology for further follow-up.       Relevant Orders   Ambulatory referral to Neurology   Other Visit Diagnoses     History of UTI       Relevant Orders   POCT Urinalysis Dipstick (Completed)   Urine Culture   Abnormal urinalysis       Relevant Orders   Urine Microscopic        Return in about 1 week (around 01/13/2022) for For B12 injection, once weekly for 3 weeks, 6-week follow-up PCP.   [name], MD [facility]

## 2022-01-06 NOTE — Assessment & Plan Note (Signed)
Chronic issue.  Refer to neurology for evaluation.

## 2022-01-06 NOTE — Assessment & Plan Note (Addendum)
Refer to neurology to help evaluate for an underlying cause.  She will continue home health PT.  We are checking a urinalysis and urine culture to help rule out UTI as a potential underlying cause for this issue.

## 2022-01-07 LAB — URINE CULTURE
MICRO NUMBER:: 13476495
SPECIMEN QUALITY:: ADEQUATE

## 2022-01-11 ENCOUNTER — Telehealth: Payer: Self-pay | Admitting: Family Medicine

## 2022-01-11 NOTE — Telephone Encounter (Signed)
Noted  

## 2022-01-11 NOTE — Telephone Encounter (Signed)
Called pt to reschedule B12 appt.... Pt daughter answered the phone... Pt daughter stated that pt was going to call office to cancel appt... Pt daughter stated that pt is unable to drive at this time... Pt daughter stated that pt is going to start taking over the counter B12 until her next appt with Dr. Birdie Sons to see were her levels are.Marland KitchenMarland Kitchen

## 2022-01-13 ENCOUNTER — Ambulatory Visit: Payer: Medicare HMO

## 2022-01-17 ENCOUNTER — Other Ambulatory Visit: Payer: Self-pay | Admitting: Family Medicine

## 2022-01-17 DIAGNOSIS — R3129 Other microscopic hematuria: Secondary | ICD-10-CM

## 2022-01-20 ENCOUNTER — Telehealth: Payer: Self-pay | Admitting: Family Medicine

## 2022-01-20 NOTE — Telephone Encounter (Signed)
Ashley [name] (Physical Therapy) From [facility] called in stating that pt refuse PT today... Pt stated that she wanted to resume PT next week.Marland KitchenMarland Kitchen

## 2022-01-24 DIAGNOSIS — S022XXD Fracture of nasal bones, subsequent encounter for fracture with routine healing: Secondary | ICD-10-CM | POA: Diagnosis not present

## 2022-01-24 DIAGNOSIS — K219 Gastro-esophageal reflux disease without esophagitis: Secondary | ICD-10-CM

## 2022-01-24 DIAGNOSIS — R296 Repeated falls: Secondary | ICD-10-CM | POA: Diagnosis not present

## 2022-01-24 DIAGNOSIS — I10 Essential (primary) hypertension: Secondary | ICD-10-CM | POA: Diagnosis not present

## 2022-01-24 DIAGNOSIS — F419 Anxiety disorder, unspecified: Secondary | ICD-10-CM

## 2022-01-24 DIAGNOSIS — I619 Nontraumatic intracerebral hemorrhage, unspecified: Secondary | ICD-10-CM

## 2022-01-24 DIAGNOSIS — E538 Deficiency of other specified B group vitamins: Secondary | ICD-10-CM

## 2022-01-24 DIAGNOSIS — S0636AD Traumatic hemorrhage of cerebrum, unspecified, with loss of consciousness status unknown, subsequent encounter: Secondary | ICD-10-CM | POA: Diagnosis not present

## 2022-01-24 DIAGNOSIS — Z8673 Personal history of transient ischemic attack (TIA), and cerebral infarction without residual deficits: Secondary | ICD-10-CM

## 2022-01-26 ENCOUNTER — Encounter: Payer: Self-pay | Admitting: *Deleted

## 2022-01-31 ENCOUNTER — Encounter: Payer: Self-pay | Admitting: Family Medicine

## 2022-01-31 ENCOUNTER — Ambulatory Visit (INDEPENDENT_AMBULATORY_CARE_PROVIDER_SITE_OTHER): Payer: Medicare HMO | Admitting: Family Medicine

## 2022-01-31 VITALS — BP 118/80 | HR 89 | Temp 98.6°F | Ht 59.0 in | Wt 145.2 lb

## 2022-01-31 DIAGNOSIS — F32A Depression, unspecified: Secondary | ICD-10-CM

## 2022-01-31 DIAGNOSIS — N179 Acute kidney failure, unspecified: Secondary | ICD-10-CM | POA: Insufficient documentation

## 2022-01-31 DIAGNOSIS — I619 Nontraumatic intracerebral hemorrhage, unspecified: Secondary | ICD-10-CM

## 2022-01-31 DIAGNOSIS — R809 Proteinuria, unspecified: Secondary | ICD-10-CM

## 2022-01-31 DIAGNOSIS — I10 Essential (primary) hypertension: Secondary | ICD-10-CM

## 2022-01-31 LAB — BASIC METABOLIC PANEL
BUN: 13 mg/dL (ref 6–23)
CO2: 26 mEq/L (ref 19–32)
Calcium: 9.4 mg/dL (ref 8.4–10.5)
Chloride: 98 mEq/L (ref 96–112)
Creatinine, Ser: 0.9 mg/dL (ref 0.40–1.20)
GFR: 61.12 mL/min (ref >60.00)
Glucose, Bld: 100 mg/dL — ABNORMAL HIGH (ref 70–99)
Potassium: 4.1 mEq/L (ref 3.5–5.1)
Sodium: 134 mEq/L — ABNORMAL LOW (ref 135–145)

## 2022-01-31 NOTE — Assessment & Plan Note (Signed)
Related to trauma.  Patient is asymptomatic.  She will see neurology as planned next week.  Discussed neurology would help determine when she could return to driving.

## 2022-01-31 NOTE — Progress Notes (Signed)
[name], MD Phone: [phone]  Ashley [name] is a 81 y.o. female who presents today for follow-up.  Depression: Patient reports this is better.  She does occasionally feel depressed relating to not being able to drive.  She is on Wellbutrin and Effexor.  No SI.  Hypertension: She not checking at home.  She is taking HCTZ.  No chest pain, shortness of breath, or edema.  Intraparenchymal brain hemorrhage: Patient sees neurology next week.  She notes no headaches, numbness, vision changes, or weakness.  She has not had any additional falls.  Physical therapy has been beneficial.  AKI: Noted on prior labs.  She notes no NSAID use.  She has been drinking more water.  Social History   Tobacco Use  Smoking Status Former   Packs/day: 0.50   Years: 15.00   Total pack years: 7.50   Types: Cigarettes   Quit date: 08/07/1976   Years since quitting: 45.5  Smokeless Tobacco Never    Current Outpatient Medications on File Prior to Visit  Medication Sig Dispense Refill   acetaminophen (TYLENOL) 650 MG CR tablet Take 1,300 mg by mouth every 8 (eight) hours as needed for pain.     aspirin EC 81 MG tablet Take 81 mg by mouth daily. Swallow whole.     buPROPion (WELLBUTRIN XL) 300 MG 24 hr tablet Take 1 tablet (300 mg total) by mouth daily. 90 tablet 1   celecoxib (CELEBREX) 200 MG capsule Take 1 capsule (200 mg total) by mouth 2 (two) times daily as needed. Home med.     cetirizine (ZYRTEC) 10 MG tablet Take 10 mg by mouth daily.     EPINEPHrine (EPIPEN 2-PAK) 0.3 mg/0.3 mL IJ SOAJ injection Inject 0.3 mLs (0.3 mg total) into the muscle as needed for anaphylaxis. 1 each 1   fluticasone (FLONASE) 50 MCG/ACT nasal spray Place 1 spray into both nostrils daily.     gabapentin (NEURONTIN) 100 MG capsule Take 1 capsule (100 mg total) by mouth at bedtime. 90 capsule 1   hydrochlorothiazide (HYDRODIURIL) 25 MG tablet TAKE 1 TABLET BY MOUTH DAILY 90 tablet 1   meclizine (ANTIVERT) 12.5 MG  tablet Take 1 tablet (12.5 mg total) by mouth 3 (three) times daily as needed for dizziness. 10 tablet 0   Melatonin 10 MG TABS Take 10 mg by mouth at bedtime.     omeprazole (PRILOSEC) 20 MG capsule TAKE 1 CAPSULE EVERY DAY 30 capsule 5   OVER THE COUNTER MEDICATION Take 2 tablets by mouth daily. Glucocil     Polyethyl Glycol-Propyl Glycol (SYSTANE) 0.4-0.3 % SOLN Place 1 drop into both eyes 3 (three) times daily as needed (dry/irritated eyes.).     rosuvastatin (CRESTOR) 40 MG tablet TAKE 1 TABLET BY MOUTH ONCE DAILY 90 tablet 1   traMADol (ULTRAM) 50 MG tablet Take 25-50 mg by mouth 2 (two) times daily as needed.     venlafaxine XR (EFFEXOR-XR) 150 MG 24 hr capsule TAKE 1 CAPSULE BY MOUTH EVERY DAY 90 capsule 3   NASACORT ALLERGY 24HR 55 MCG/ACT AERO nasal inhaler 2 sprays daily.     No current facility-administered medications on file prior to visit.     ROS see history of present illness  Objective  Physical Exam Vitals:   01/31/22 1158  BP: 118/80  Pulse: 89  Temp: 98.6 F (37 C)  SpO2: 96%    BP Readings from Last 3 Encounters:  01/31/22 118/80  01/06/22 115/60  12/30/21 128/84   Wt  Readings from Last 3 Encounters:  01/31/22 145 lb 3.2 oz (65.9 kg)  01/06/22 142 lb (64.4 kg)  01/03/22 147 lb (66.7 kg)    Physical Exam Constitutional:      General: She is not in acute distress.    Appearance: She is not diaphoretic.  Cardiovascular:     Rate and Rhythm: Normal rate and regular rhythm.     Heart sounds: Normal heart sounds.  Pulmonary:     Effort: Pulmonary effort is normal.     Breath sounds: Normal breath sounds.  Skin:    General: Skin is warm and dry.  Neurological:     Mental Status: She is alert.      Assessment/Plan: Please see individual problem list.  Problem List Items Addressed This Visit     Depression (Chronic)    Improved.  Continue Wellbutrin 300 mg daily and venlafaxine 150 mg daily.      Essential hypertension (Chronic)     Adequate control for age.  She will continue HCTZ 25 mg daily.      AKI (acute kidney injury) (HCC)    Recheck BMP.      Relevant Orders   Basic Metabolic Panel (BMET)   Intraparenchymal hemorrhage of brain (HCC)    Related to trauma.  Patient is asymptomatic.  She will see neurology as planned next week.  Discussed neurology would help determine when she could return to driving.      Proteinuria - Primary    Check urine protein creatinine ratio.      Relevant Orders   Protein / creatinine ratio, urine    Return in about 3 months (around 05/03/2022).   [name], MD [facility]

## 2022-02-01 LAB — PROTEIN / CREATININE RATIO, URINE
Creatinine, Urine: 29 mg/dL (ref 20–275)
Protein/Creat Ratio: 207 mg/g creat — ABNORMAL HIGH (ref 24–184)
Protein/Creatinine Ratio: 0.207 mg/mg creat — ABNORMAL HIGH (ref 0.024–0.184)
Total Protein, Urine: 6 mg/dL (ref 5–24)

## 2022-02-02 NOTE — Telephone Encounter (Signed)
Tammy from [facility] called stating pt missed her therapy appointment because she was not feeling well

## 2022-02-09 ENCOUNTER — Telehealth: Payer: Self-pay | Admitting: Family Medicine

## 2022-02-09 NOTE — Telephone Encounter (Signed)
Tammy, Home Health nurse called to report a fall, [phone]. Patient lost her balance today when stepping over a curb. She fell and bruised her knees ans scrapped her rt palm of her hand. Patient stated she is fine. Tammy stated that patient does not need to go to ED. Tammy advised patient that she needs to be using her walker.

## 2022-02-09 NOTE — Telephone Encounter (Signed)
Noted. Agree with need for her to use a walker.

## 2022-02-13 ENCOUNTER — Ambulatory Visit: Payer: Self-pay | Admitting: Diagnostic Neuroimaging

## 2022-02-20 ENCOUNTER — Emergency Department: Payer: Medicare HMO

## 2022-02-20 ENCOUNTER — Other Ambulatory Visit: Payer: Self-pay

## 2022-02-20 ENCOUNTER — Emergency Department
Admission: EM | Admit: 2022-02-20 | Discharge: 2022-02-21 | Disposition: A | Discharge status: Home / Self Care | Payer: Medicare HMO | Attending: Emergency Medicine | Admitting: Emergency Medicine

## 2022-02-20 ENCOUNTER — Telehealth: Payer: Self-pay | Admitting: Family Medicine

## 2022-02-20 DIAGNOSIS — E785 Hyperlipidemia, unspecified: Secondary | ICD-10-CM | POA: Diagnosis not present

## 2022-02-20 DIAGNOSIS — R270 Ataxia, unspecified: Secondary | ICD-10-CM | POA: Insufficient documentation

## 2022-02-20 DIAGNOSIS — I6782 Cerebral ischemia: Secondary | ICD-10-CM | POA: Diagnosis not present

## 2022-02-20 DIAGNOSIS — I10 Essential (primary) hypertension: Secondary | ICD-10-CM | POA: Insufficient documentation

## 2022-02-20 DIAGNOSIS — Z8679 Personal history of other diseases of the circulatory system: Secondary | ICD-10-CM | POA: Diagnosis not present

## 2022-02-20 DIAGNOSIS — W1839XA Other fall on same level, initial encounter: Secondary | ICD-10-CM | POA: Diagnosis not present

## 2022-02-20 DIAGNOSIS — W19XXXA Unspecified fall, initial encounter: Secondary | ICD-10-CM

## 2022-02-20 DIAGNOSIS — R42 Dizziness and giddiness: Secondary | ICD-10-CM | POA: Diagnosis present

## 2022-02-20 LAB — COMPREHENSIVE METABOLIC PANEL
ALT: 22 U/L (ref 0–44)
AST: 31 U/L (ref 15–41)
Albumin: 4.6 g/dL (ref 3.5–5.0)
Alkaline Phosphatase: 48 U/L (ref 38–126)
Anion gap: 11 (ref 5–15)
BUN: 16 mg/dL (ref 8–23)
CO2: 23 mmol/L (ref 22–32)
Calcium: 9.4 mg/dL (ref 8.9–10.3)
Chloride: 102 mmol/L (ref 98–111)
Creatinine, Ser: 0.97 mg/dL (ref 0.44–1.00)
GFR, Estimated: 60 mL/min — ABNORMAL LOW (ref >60)
Glucose, Bld: 103 mg/dL — ABNORMAL HIGH (ref 70–99)
Potassium: 3.7 mmol/L (ref 3.5–5.1)
Sodium: 136 mmol/L (ref 135–145)
Total Bilirubin: 0.7 mg/dL (ref 0.3–1.2)
Total Protein: 7.4 g/dL (ref 6.5–8.1)

## 2022-02-20 LAB — URINALYSIS, ROUTINE W REFLEX MICROSCOPIC
Bacteria, UA: NONE SEEN
Bilirubin Urine: NEGATIVE
Glucose, UA: NEGATIVE mg/dL
Ketones, ur: NEGATIVE mg/dL
Nitrite: NEGATIVE
Protein, ur: NEGATIVE mg/dL
Specific Gravity, Urine: 1.009 (ref 1.005–1.030)
pH: 6 (ref 5.0–8.0)

## 2022-02-20 LAB — CBC
HCT: 43.6 % (ref 36.0–46.0)
Hemoglobin: 13.7 g/dL (ref 12.0–15.0)
MCH: 28.9 pg (ref 26.0–34.0)
MCHC: 31.4 g/dL (ref 30.0–36.0)
MCV: 92 fL (ref 80.0–100.0)
Platelets: 322 10*3/uL (ref 150–400)
RBC: 4.74 MIL/uL (ref 3.87–5.11)
RDW: 13.6 % (ref 11.5–15.5)
WBC: 8.8 10*3/uL (ref 4.0–10.5)
nRBC: 0 % (ref 0.0–0.2)

## 2022-02-20 LAB — APTT: aPTT: 29 seconds (ref 24–36)

## 2022-02-20 MED ORDER — LORAZEPAM 1 MG PO TABS
1.0000 mg | ORAL_TABLET | Freq: Once | ORAL | Status: AC
  Administered 2022-02-20: 1 mg via ORAL
  Filled 2022-02-20: qty 1

## 2022-02-20 MED ORDER — LORAZEPAM 2 MG/ML IJ SOLN
0.5000 mg | Freq: Once | INTRAMUSCULAR | Status: AC
  Administered 2022-02-20: 0.5 mg via INTRAVENOUS
  Filled 2022-02-20: qty 1

## 2022-02-20 NOTE — ED Provider Triage Note (Signed)
Emergency Medicine Provider Triage Evaluation Note  Ashley [name] , a 81 y.o. female  was evaluated in triage.  Pt complains of multiple falls and worried about another head injury. Family concerned about change in speech gradually since last "brain bleed".  Admission 12/28/21 for cerebral hemorrhage.  Witnessed fall at beauty salon today and beautician noticed confusion afterwards.  Review of Systems  Positive: Positive fall, positive history of cerebral hemorrhage Negative: Nausea, vomiting, numbness or tingling.  Physical Exam  BP (!) 143/102 (BP Location: Right Arm)   Pulse 89   Temp 98.3 F (36.8 C)   Resp 16   Ht 4\' 11"  (1.499 m)   Wt 63.5 kg   SpO2 98%   BMI 28.28 kg/m  Gen:   Awake, no distress talkative.  No observed slurred speech. Resp:  Normal effort. MSK:   Moves extremities without difficulty  Other:  No facial drooping, patient is speaking and answering questions appropriately per family member.  Medical Decision Making  Medically screening exam initiated at 2:47 PM.  Appropriate orders placed.  Ashley [name] was informed that the remainder of the evaluation will be completed by another provider, this initial triage assessment does not replace that evaluation, and the importance of remaining in the ED until their evaluation is complete.     [name], PA-C 02/20/22 1510

## 2022-02-20 NOTE — Telephone Encounter (Signed)
Patient returned our call.  I transferred patient's call to our Practice Administrator, Henrene Pastor, RN.

## 2022-02-20 NOTE — Telephone Encounter (Addendum)
Spoke with [name], daughter (DPR) calling to speak with patient due to daughter is in [place] not with patient, has stepbrother coming to drive her home from [employer] where patient fell out of chair and hairdresser reported to daughter patient confused disoriented but did not think patient lost consciousness.  Advised daughter I would call and speak to patient to see if I could encourage patient to go to ED for evaluation. Tried to reach patient no answer received voicemail left message to call office.

## 2022-02-20 NOTE — Telephone Encounter (Signed)
Patient's daughter, [name] (daughter is on [place]) , called. She is not with patient. Patient was at the hair salon today and had a episode when getting off chair she fell. She was helped up and was not sure where she was and confused. Patient stated to employees that she thought she might have had a mini stroke. [name] called her mother and mother did not sound right. [name] is not close by,  she works in [place].

## 2022-02-20 NOTE — ED Triage Notes (Addendum)
Pt states she has had multiple falls over the past severals months due to loss of balance, states she had hemorrhage of the brain from one of the previous falls, states today she fell again, denies head injury today . Family is concerned she has had a stroke, states she has change in her speech, pt denies any numbness or tingling. Pt is a/ox4

## 2022-02-20 NOTE — Telephone Encounter (Signed)
Patient says she has fallen a lot lately and that today she was up walking to the chair and fell on her way to the chair. Patient says she has history of TIAs and she has never felt like this before, she cannot control her hands and legs at times, "when I go to wright a check my hand goes another direction." Patient has history  of brain bleed in May and she fell and hit head. Patient feels very nervous , and still feel unbalanced keeps repeating she feels okay. I advised patient that she really needs to be evaluated at the ED concerning her confusion and falling and the fact she feels so anxious. Patient agreed to have family member drive her to the ED.

## 2022-02-20 NOTE — Telephone Encounter (Signed)
Spoke with daughter advised daughter when patient has confusion to point of falling that the recommendation is for patient to be evaluated immediately to have someone drive her to the ED.

## 2022-02-20 NOTE — ED Provider Notes (Signed)
[facility] Provider Note    Event Date/Time   First MD Initiated Contact with Patient 02/20/22 1735     (approximate)  History   Chief Complaint: Fall and Dizziness  HPI  Ashley [name] is a 81 y.o. female with a past medical history of anxiety, arthritis, depression, hypertension, hyperlipidemia, prior lacunar infarct, prior TIA, presents to the emergency department for a fall.  According to the patient and the husband for the past 6 months or so patient has been experiencing significant falls and feeling off balance.  Patient states she is followed up with her doctor she has been here several times.  Per record review patient last had an MRI in May showing a very small area of hemorrhage but no other significant infarct.  Patient denies any weakness or numbness of any arm or leg confusion or difficulty speaking.  Husband is here as well who denies any new problems but states over the past 6 months patient has become very off balance, and believes her speech has slowed somewhat over the past 6 months as well.  Physical Exam   Triage Vital Signs: ED Triage Vitals  Enc Vitals Group     BP 02/20/22 1445 (!) 143/102     Pulse Rate 02/20/22 1445 89     Resp 02/20/22 1445 16     Temp 02/20/22 1446 98.3 F (36.8 C)     Temp Source 02/20/22 1445 Oral     SpO2 02/20/22 1445 98 %     Weight 02/20/22 1446 140 lb (63.5 kg)     Height 02/20/22 1446 4\' 11"  (1.499 m)     Head Circumference --      Peak Flow --      Pain Score 02/20/22 1445 0     Pain Loc --      Pain Edu? --      Excl. in GC? --     Most recent vital signs: Vitals:   02/20/22 1445 02/20/22 1446  BP: (!) 143/102   Pulse: 89   Resp: 16   Temp:  98.3 F (36.8 C)  SpO2: 98%     General: Awake, no distress.  CV:  Good peripheral perfusion.  Regular rate and rhythm  Resp:  Normal effort.  Equal breath sounds bilaterally.  Abd:  No distention.  Soft, nontender.  No rebound or  guarding. Other:  Equal grip strength bilaterally.  5/5 strength in all extremities.  No pronator drift.  No obvious cranial nerve deficit.   ED Results / Procedures / Treatments   EKG  EKG viewed and interpreted by myself shows a normal sinus rhythm at 91 bpm with a narrow QRS, normal axis, normal intervals, no concerning ST changes.  RADIOLOGY  I have reviewed and interpreted the CT images of the head.  I do not see any large bleed on my evaluation. Radiology is read the CT scan is negative.   MEDICATIONS ORDERED IN ED: Medications  LORazepam (ATIVAN) tablet 1 mg (has no administration in time range)  LORazepam (ATIVAN) injection 0.5 mg (has no administration in time range)     IMPRESSION / MDM / ASSESSMENT AND PLAN / ED COURSE  I reviewed the triage vital signs and the nursing notes.  Patient's presentation is most consistent with acute presentation with potential threat to life or bodily function.  Patient presents to the emergency department for off-balance sensation and increased falls over the past 6 months.  Overall the patient appears  well, no distress, reassuring neurological exam.  Reassuring physical exam.  Patient CT scan shows no acute findings.  I reviewed the patient's chart her last MRI was in May and it did show a small hemorrhage at that time.  Given the patient's worsening off-balance sensation with increased falls over the past 6 months we will proceed with MR imaging of the brain to rule out any acute abnormality.  Husband and patient agreeable to plan of care.  Patient CBC is normal, chemistry is normal, urinalysis is normal.  MRI pending.  Patient also has moderate tenderness to the right gluteus area where the patient has a moderate hematoma.  We will obtain hip and pelvis x-rays although the patient does have good range of motion in the hip currently.  Hip x-ray shows no acute findings.  MRI has resulted showing no acute findings either.  No sign of acute  CVA.  Patient will be discharged home.  Patient agreeable to plan.  FINAL CLINICAL IMPRESSION(S) / ED DIAGNOSES   Fall Ataxia    Note:  This document was prepared using Dragon voice recognition software and may include unintentional dictation errors.   Minna Antis, MD 02/20/22 2311

## 2022-02-20 NOTE — Telephone Encounter (Signed)
FYI patient confirmed at ED now.

## 2022-02-20 NOTE — Telephone Encounter (Signed)
Pt called and stated she is at the ER and they have not did anything yet

## 2022-02-21 ENCOUNTER — Ambulatory Visit (INDEPENDENT_AMBULATORY_CARE_PROVIDER_SITE_OTHER): Payer: Medicare HMO | Admitting: Family Medicine

## 2022-02-21 ENCOUNTER — Encounter: Payer: Self-pay | Admitting: Family Medicine

## 2022-02-21 VITALS — BP 110/70 | HR 103 | Temp 98.1°F | Ht 59.0 in | Wt 140.8 lb

## 2022-02-21 DIAGNOSIS — R2689 Other abnormalities of gait and mobility: Secondary | ICD-10-CM | POA: Diagnosis not present

## 2022-02-21 DIAGNOSIS — R809 Proteinuria, unspecified: Secondary | ICD-10-CM

## 2022-02-21 DIAGNOSIS — R682 Dry mouth, unspecified: Secondary | ICD-10-CM

## 2022-02-21 DIAGNOSIS — J3081 Allergic rhinitis due to animal (cat) (dog) hair and dander: Secondary | ICD-10-CM | POA: Insufficient documentation

## 2022-02-21 NOTE — Patient Instructions (Signed)
Nice to see you. Please keep the neurology appointment.  Please stop the gabapentin and the zyrtec. We can see if stopping those with help with your symptoms.

## 2022-02-21 NOTE — Telephone Encounter (Signed)
FYI Pt seen in ED yesterday

## 2022-02-21 NOTE — Assessment & Plan Note (Signed)
She will stop Zyrtec and see if that makes a difference.

## 2022-02-21 NOTE — Assessment & Plan Note (Signed)
I wonder if the patient's falls could be related to something like Parkinson's given her speech changes and shuffling gait.  She notes some memory issues as well.  She has had lab evaluation and imaging of her head that have not revealed a cause for her symptoms.  Strength is intact in her bilateral legs and arms.  I have encouraged her to use her walker at all times.  They will see neurology for further evaluation as I do think there may be some underlying neurological cause that could tie all of these issues together.  I am going to have her stop the gabapentin to see if that makes a difference though she is on such a low dose that I doubt that is the underlying cause. 

## 2022-02-21 NOTE — Assessment & Plan Note (Signed)
Recheck today. 

## 2022-02-21 NOTE — Assessment & Plan Note (Signed)
I wonder if the patient's falls could be related to something like Parkinson's given her speech changes and shuffling gait.  She notes some memory issues as well.  She has had lab evaluation and imaging of her head that have not revealed a cause for her symptoms.  Strength is intact in her bilateral legs and arms.  I have encouraged her to use her walker at all times.  They will see neurology for further evaluation as I do think there may be some underlying neurological cause that could tie all of these issues together.  I am going to have her stop the gabapentin to see if that makes a difference though she is on such a low dose that I doubt that is the underlying cause.

## 2022-02-21 NOTE — Telephone Encounter (Signed)
Patient is being seen today for follow-up.

## 2022-02-21 NOTE — Progress Notes (Signed)
[name], MD Phone: [phone]  Ashley [name] is a 81 y.o. female who presents today for f/u.  Falls: Patient continues to have issues with falls.  She was in the ED yesterday for a fall.  She was at the beauty shop and walking when she just fell.  She had a CT of her head and MRI brain that did not reveal any specific cause for her falls.  She underwent x-ray of her right hip and pelvis with no apparent fracture.  Lab work was unremarkable for cause of her falls.  She was seen previously for this and was doing home health physical therapy.  She had also been referred to neurology and ENT.  She has an appointment with neurology in August.  She notes that appointment has been moved out at least once.  She notes she saw ENT and they did not find any cause for her falls.  Patient notes sometimes her legs feel weak and her daughter notes at times she seems to lean to the right.  Her daughter notes she does shuffle her feet.  Her speech has changed over the last several months.  She takes gabapentin 100 mg nightly.  She rarely takes tramadol.  Dry mouth: Patient complains of dry mouth.  Proteinuria: Patient is due for follow-up urine testing.  Social History   Tobacco Use  Smoking Status Former   Packs/day: 0.50   Years: 15.00   Total pack years: 7.50   Types: Cigarettes   Quit date: 08/07/1976   Years since quitting: 45.5  Smokeless Tobacco Never    Current Outpatient Medications on File Prior to Visit  Medication Sig Dispense Refill   acetaminophen (TYLENOL) 650 MG CR tablet Take 1,300 mg by mouth every 8 (eight) hours as needed for pain.     aspirin EC 81 MG tablet Take 81 mg by mouth daily. Swallow whole.     buPROPion (WELLBUTRIN XL) 300 MG 24 hr tablet Take 1 tablet (300 mg total) by mouth daily. 90 tablet 1   celecoxib (CELEBREX) 200 MG capsule Take 1 capsule (200 mg total) by mouth 2 (two) times daily as needed. Home med.     EPINEPHrine (EPIPEN 2-PAK) 0.3 mg/0.3 mL IJ  SOAJ injection Inject 0.3 mLs (0.3 mg total) into the muscle as needed for anaphylaxis. 1 each 1   fluticasone (FLONASE) 50 MCG/ACT nasal spray Place 1 spray into both nostrils daily.     hydrochlorothiazide (HYDRODIURIL) 25 MG tablet TAKE 1 TABLET BY MOUTH DAILY 90 tablet 1   meclizine (ANTIVERT) 12.5 MG tablet Take 1 tablet (12.5 mg total) by mouth 3 (three) times daily as needed for dizziness. 10 tablet 0   Melatonin 10 MG TABS Take 10 mg by mouth at bedtime.     NASACORT ALLERGY 24HR 55 MCG/ACT AERO nasal inhaler 2 sprays daily.     omeprazole (PRILOSEC) 20 MG capsule TAKE 1 CAPSULE EVERY DAY 30 capsule 5   OVER THE COUNTER MEDICATION Take 2 tablets by mouth daily. Glucocil     Polyethyl Glycol-Propyl Glycol (SYSTANE) 0.4-0.3 % SOLN Place 1 drop into both eyes 3 (three) times daily as needed (dry/irritated eyes.).     rosuvastatin (CRESTOR) 40 MG tablet TAKE 1 TABLET BY MOUTH ONCE DAILY 90 tablet 1   traMADol (ULTRAM) 50 MG tablet Take 25-50 mg by mouth 2 (two) times daily as needed.     venlafaxine XR (EFFEXOR-XR) 150 MG 24 hr capsule TAKE 1 CAPSULE BY MOUTH EVERY DAY 90  capsule 3   No current facility-administered medications on file prior to visit.     ROS see history of present illness  Objective  Physical Exam Vitals:   02/21/22 1149  BP: 110/70  Pulse: (!) 103  Temp: 98.1 F (36.7 C)  SpO2: 94%    BP Readings from Last 3 Encounters:  02/21/22 110/70  02/20/22 124/81  01/31/22 118/80   Wt Readings from Last 3 Encounters:  02/21/22 140 lb 12.8 oz (63.9 kg)  02/20/22 140 lb (63.5 kg)  01/31/22 145 lb 3.2 oz (65.9 kg)    Physical Exam Constitutional:      General: She is not in acute distress.    Appearance: She is not diaphoretic.  Cardiovascular:     Rate and Rhythm: Normal rate and regular rhythm.     Heart sounds: Normal heart sounds.  Pulmonary:     Effort: Pulmonary effort is normal.     Breath sounds: Normal breath sounds.  Skin:    General: Skin is  warm and dry.  Neurological:     Mental Status: She is alert.     Comments: Speech is slightly slurred, 5/5 strength in bilateral biceps, triceps, grip, quads, hamstrings, plantar and dorsiflexion, sensation to light touch intact in bilateral UE and LE, feet shuffle on gait with and without walker, no cogwheel rigidity      Assessment/Plan: Please see individual problem list.  Problem List Items Addressed This Visit     Balance problem - Primary (Chronic)    I wonder if the patient's falls could be related to something like Parkinson's given her speech changes and shuffling gait.  She notes some memory issues as well.  She has had lab evaluation and imaging of her head that have not revealed a cause for her symptoms.  Strength is intact in her bilateral legs and arms.  I have encouraged her to use her walker at all times.  They will see neurology for further evaluation as I do think there may be some underlying neurological cause that could tie all of these issues together.  I am going to have her stop the gabapentin to see if that makes a difference though she is on such a low dose that I doubt that is the underlying cause.      Dry mouth    She will stop Zyrtec and see if that makes a difference.      Proteinuria    Recheck today.      Relevant Orders   Protein / creatinine ratio, urine     Return for as scheduled.   [name], MD [facility]

## 2022-02-22 LAB — PROTEIN / CREATININE RATIO, URINE
Creatinine, Urine: 185 mg/dL (ref 20–275)
Protein/Creat Ratio: 368 mg/g creat — ABNORMAL HIGH (ref 24–184)
Protein/Creatinine Ratio: 0.368 mg/mg creat — ABNORMAL HIGH (ref 0.024–0.184)
Total Protein, Urine: 68 mg/dL — ABNORMAL HIGH (ref 5–24)

## 2022-02-23 ENCOUNTER — Telehealth: Payer: Self-pay

## 2022-02-23 NOTE — Telephone Encounter (Signed)
LVM to call back to go over results 

## 2022-02-24 ENCOUNTER — Encounter: Payer: Self-pay | Admitting: *Deleted

## 2022-03-01 ENCOUNTER — Other Ambulatory Visit: Payer: Self-pay | Admitting: Family Medicine

## 2022-03-01 DIAGNOSIS — R809 Proteinuria, unspecified: Secondary | ICD-10-CM

## 2022-03-01 MED ORDER — MECLIZINE HCL 12.5 MG PO TABS: 12.5000 mg | ORAL_TABLET | Freq: Three times a day (TID) | ORAL | 0 refills | Status: DC | PRN

## 2022-03-07 ENCOUNTER — Ambulatory Visit: Payer: Medicare HMO | Admitting: Diagnostic Neuroimaging

## 2022-03-07 ENCOUNTER — Encounter: Payer: Self-pay | Admitting: Diagnostic Neuroimaging

## 2022-03-07 VITALS — BP 130/84 | HR 86 | Ht 59.0 in | Wt 140.4 lb

## 2022-03-07 DIAGNOSIS — R269 Unspecified abnormalities of gait and mobility: Secondary | ICD-10-CM

## 2022-03-07 DIAGNOSIS — R413 Other amnesia: Secondary | ICD-10-CM | POA: Diagnosis not present

## 2022-03-07 NOTE — Patient Instructions (Addendum)
  GAIT DIFF  - check MRI cervical / lumbar spine - use walker - no driving for now - continue PT exercises  MEMORY / COGNITIVE DECLINE - vascular cognitive impairment; post concussion syndrome; B12 deficiency  B12 deficiency - continue B12 supplements; follow up PCP  LIGHTHEADEDNESS  - follow up with PCP; consider cardiac workup for presyncope

## 2022-03-07 NOTE — Progress Notes (Signed)
[facility]  PATIENT: Ashley [name] DOB: January 12, 1941  REFERRING CLINICIAN: Glori Luis, MD HISTORY FROM: patient  REASON FOR VISIT: new consult    HISTORICAL  CHIEF COMPLAINT:  Chief Complaint  Patient presents with   Cerebral hemorrhage, balance, memory    Rm 6 New Pt, hospital FU, husband- [name], dgtrGaylyn Rong  MMSE 27    HISTORY OF PRESENT ILLNESS:   81 year old female here for evaluation of gait and balance difficulty, short-term memory loss, confusion.  Early May 2023 patient was having some problems with cognitive tasks and texting with her daughter.  She went to the hospital on Dec 22, 2021 for dizziness and unsteadiness.  CT and MRI were obtained and unremarkable.  Patient was discharged with meclizine.  Patient returned on Dec 28 2021 due to severe fall and injury.  She fell forward lost her balance and landed on her face on concrete.  She had severe bruising and injury.  CAT scan demonstrated small periventricular intraparenchymal hemorrhage, possibly posttraumatic versus hypertensive.  Patient was admitted for evaluation.  She was found to have severely low vitamin B12 level of 190.   REVIEW OF SYSTEMS: Full 14 system review of systems performed and negative with exception of: as per HPI.  ALLERGIES: Allergies  Allergen Reactions   Ace Inhibitors Other (See Comments)    Angioedema   Pravastatin Other (See Comments)    Other reaction(s): Muscle Pain   Simvastatin Other (See Comments)    stiffness    HOME MEDICATIONS: Outpatient Medications Prior to Visit  Medication Sig Dispense Refill   acetaminophen (TYLENOL) 650 MG CR tablet Take 1,300 mg by mouth every 8 (eight) hours as needed for pain.     aspirin EC 81 MG tablet Take 81 mg by mouth daily. Swallow whole.     buPROPion (WELLBUTRIN XL) 300 MG 24 hr tablet Take 1 tablet (300 mg total) by mouth daily. 90 tablet 1   EPINEPHrine (EPIPEN 2-PAK) 0.3 mg/0.3 mL IJ SOAJ injection Inject  0.3 mLs (0.3 mg total) into the muscle as needed for anaphylaxis. 1 each 1   fluticasone (FLONASE) 50 MCG/ACT nasal spray Place 1 spray into both nostrils daily.     hydrochlorothiazide (HYDRODIURIL) 25 MG tablet TAKE 1 TABLET BY MOUTH DAILY 90 tablet 1   meclizine (ANTIVERT) 12.5 MG tablet Take 1 tablet (12.5 mg total) by mouth 3 (three) times daily as needed for dizziness. 30 tablet 0   Melatonin 10 MG TABS Take 10 mg by mouth at bedtime.     NASACORT ALLERGY 24HR 55 MCG/ACT AERO nasal inhaler 2 sprays daily.     omeprazole (PRILOSEC) 20 MG capsule TAKE 1 CAPSULE EVERY DAY 30 capsule 5   OVER THE COUNTER MEDICATION Take 2 tablets by mouth daily. Glucocil     Polyethyl Glycol-Propyl Glycol (SYSTANE) 0.4-0.3 % SOLN Place 1 drop into both eyes 3 (three) times daily as needed (dry/irritated eyes.).     rosuvastatin (CRESTOR) 40 MG tablet TAKE 1 TABLET BY MOUTH ONCE DAILY 90 tablet 1   traMADol (ULTRAM) 50 MG tablet Take 25-50 mg by mouth 2 (two) times daily as needed.     venlafaxine XR (EFFEXOR-XR) 150 MG 24 hr capsule TAKE 1 CAPSULE BY MOUTH EVERY DAY 90 capsule 3   celecoxib (CELEBREX) 200 MG capsule Take 1 capsule (200 mg total) by mouth 2 (two) times daily as needed. Home med. (Patient not taking: Reported on 03/07/2022)     No facility-administered medications prior to  visit.    PAST MEDICAL HISTORY: Past Medical History:  Diagnosis Date   Abnormal uterine bleeding (AUB)    a. benign endometrial polyps on D+C in 03/2017   Anxiety    Arthritis    Carpal tunnel syndrome of left wrist    Cerebral hemorrhage (HCC) 2023   Chicken pox    Depression    GERD (gastroesophageal reflux disease)    History of stress test    a. 08/2017 Lexiscan MV: EF 55-65%. No ischemia/infarct. Low risk.   Hyperlipidemia    Hypertension    Lacunar infarction (HCC)    a. Dx in setting of dizziness/vertigo; b. 02/2018 Carotid U/S: bilateral intimal thickening and atherosclerotic plaque. No hemodynamically  significant stenoses; c. 03/2018 MRI Brain: No mass or abnl enhancement. Chronic microvascular ischemic changes and mod volume loss of the brain. 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   Spinal stenosis of lumbar region with radiculopathy 2019   Stroke (HCC)    PT STATES SHE WAS TOLD THIS BY HER PCP BUT PT STATES SHE NEVER KNEW SHE HAD A STROKE-NO RESIDUAL EFFECTS   Vertigo     PAST SURGICAL HISTORY: Past Surgical History:  Procedure Laterality Date   BACK SURGERY  11/19/2017   lumbar laminectomy   CARPAL TUNNEL RELEASE Right 07/30/2019   CARPAL TUNNEL RELEASE Left 02/21/2021   Procedure: CARPAL TUNNEL RELEASE;  Surgeon: Donato HeinzHooten, James P, MD;  Location: [facility];  Service: Orthopedics;  Laterality: Left;   COLONOSCOPY     2003, 2011, 2021   COLONOSCOPY WITH PROPOFOL N/A 12/24/2019   Procedure: COLONOSCOPY WITH PROPOFOL;  Surgeon: Toledo, Boykin Nearingeodoro K, MD;  Location: [facility];  Service: Gastroenterology;  Laterality: N/A;   CYSTOSCOPY MACROPLASTIQUE IMPLANT N/A 05/15/2019   Procedure: CYSTOSCOPY MACROPLASTIQUE IMPLANT;  Surgeon: Orson ApeWolff, Michael R, MD;  Location: [facility];  Service: Urology;  Laterality: N/A;   DILATATION & CURETTAGE/HYSTEROSCOPY WITH MYOSURE N/A 03/30/2017   Procedure: DILATATION & CURETTAGE/HYSTEROSCOPY WITH MYOSURE;  Surgeon: Schermerhorn, Ihor Austinhomas J, MD;  Location: [facility];  Service: Gynecology;  Laterality: N/A;   EYE SURGERY Bilateral    Cataract Extraction with IOL   JOINT REPLACEMENT     KNEE ARTHROPLASTY Left 12/01/2020   Procedure: COMPUTER ASSISTED TOTAL KNEE ARTHROPLASTY;  Surgeon: Donato HeinzHooten, James P, MD;  Location: [facility];  Service: Orthopedics;  Laterality: Left;   SKIN LESION EXCISION Right    synovial cyst right AC joint   TONSILLECTOMY AND ADENOIDECTOMY     Age 89    FAMILY HISTORY: Family History  Problem Relation Age of Onset   Rheum arthritis Mother    Ovarian cancer Sister    COPD Sister    CAD Sister    Heart disease  Father    Aneurysm Father    Cancer Brother    Thrombosis Maternal Grandmother        a. DVT   Thrombosis Maternal Grandfather        a. DVT   Thrombosis Paternal Grandmother        a. DVT   Thrombosis Paternal Grandfather        a. DVT    SOCIAL HISTORY: Social History   Socioeconomic History   Marital status: Married    Spouse name: Adela LankFloyd   Number of children: 1   Years of education: Not on file   Highest education level: Bachelor's degree (e.g., BA, AB, BS)  Occupational History   Not on file  Tobacco Use   Smoking status:  Former    Packs/day: 0.50    Years: 15.00    Total pack years: 7.50    Types: Cigarettes    Quit date: 08/07/1976    Years since quitting: 45.6   Smokeless tobacco: Never  Vaping Use   Vaping Use: Never used  Substance and Sexual Activity   Alcohol use: Yes    Alcohol/week: 1.0 standard drink of alcohol    Types: 1 Glasses of wine per week    Comment: ocassionally   Drug use: No   Sexual activity: Not Currently  Other Topics Concern   Not on file  Social History Narrative   03/07/22 Married    Social Determinants of Health   Financial Resource Strain: Low Risk  (01/03/2022)   Overall Financial Resource Strain (CARDIA)    Difficulty of Paying Living Expenses: Not hard at all  Food Insecurity: No Food Insecurity (01/03/2022)   Hunger Vital Sign    Worried About Running Out of Food in the Last Year: Never true    Ran Out of Food in the Last Year: Never true  Transportation Needs: No Transportation Needs (01/03/2022)   PRAPARE - Administrator, Civil Service (Medical): No    Lack of Transportation (Non-Medical): No  Physical Activity: Unknown (04/19/2018)   Exercise Vital Sign    Days of Exercise per Week: 0 days    Minutes of Exercise per Session: Not on file  Stress: No Stress Concern Present (01/03/2022)   [employer] of Occupational Health - Occupational Stress Questionnaire    Feeling of Stress : Not at all  Social  Connections: Unknown (01/03/2022)   Social Connection and Isolation Panel [NHANES]    Frequency of Communication with Friends and Family: Not on file    Frequency of Social Gatherings with Friends and Family: Not on file    Attends Religious Services: Not on file    Active Member of Clubs or Organizations: Not on file    Attends Banker Meetings: Not on file    Marital Status: Married  Intimate Partner Violence: Not At Risk (01/03/2022)   Humiliation, Afraid, Rape, and Kick questionnaire    Fear of Current or Ex-Partner: No    Emotionally Abused: No    Physically Abused: No    Sexually Abused: No     PHYSICAL EXAM  GENERAL EXAM/CONSTITUTIONAL: Vitals:  Vitals:   03/07/22 1207  BP: 130/84  Pulse: 86  Weight: 140 lb 6.4 oz (63.7 kg)  Height: 4\' 11"  (1.499 m)   Body mass index is 28.36 kg/m. Wt Readings from Last 3 Encounters:  03/07/22 140 lb 6.4 oz (63.7 kg)  02/21/22 140 lb 12.8 oz (63.9 kg)  02/20/22 140 lb (63.5 kg)   Patient is in no distress; well developed, nourished and groomed; neck is supple  CARDIOVASCULAR: Examination of carotid arteries is normal; no carotid bruits Regular rate and rhythm, no murmurs Examination of peripheral vascular system by observation and palpation is normal  EYES: Ophthalmoscopic exam of optic discs and posterior segments is normal; no papilledema or hemorrhages No results found.  MUSCULOSKELETAL: Gait, strength, tone, movements noted in Neurologic exam below  NEUROLOGIC: MENTAL STATUS:     03/07/2022   12:11 PM  MMSE - Mini Mental State Exam  Orientation to time 5  Orientation to Place 5  Registration 3  Attention/ Calculation 4  Recall 2  Language- name 2 objects 2  Language- repeat 0  Language- follow 3 step command 3  Language-  read & follow direction 1  Write a sentence 1  Copy design 1  Total score 27   awake, alert, oriented to person, place and time recent and remote memory intact normal attention  and concentration language fluent, comprehension intact, naming intact fund of knowledge appropriate  CRANIAL NERVE:  2nd - no papilledema on fundoscopic exam 2nd, 3rd, 4th, 6th - pupils equal and reactive to light, visual fields full to confrontation, extraocular muscles intact, no nystagmus 5th - facial sensation symmetric 7th - facial strength symmetric 8th - hearing intact 9th - palate elevates symmetrically, uvula midline 11th - shoulder shrug symmetric 12th - tongue protrusion midline  MOTOR:  normal bulk and tone, full strength in the BUE, BLE  SENSORY:  normal and symmetric to light touch, temperature, vibration  COORDINATION:  finger-nose-finger, fine finger movements normal  REFLEXES:  deep tendon reflexes TRACE and symmetric  GAIT/STATION:  narrow based gait; SHORT STEPS; LIMPING ON LEFT LEG; ALMOST FELL DOWN (? Lightheaded)     DIAGNOSTIC DATA (LABS, IMAGING, TESTING) - I reviewed patient records, labs, notes, testing and imaging myself where available.  Lab Results  Component Value Date   WBC 8.8 02/20/2022   HGB 13.7 02/20/2022   HCT 43.6 02/20/2022   MCV 92.0 02/20/2022   PLT 322 02/20/2022      Component Value Date/Time   NA 136 02/20/2022 1448   K 3.7 02/20/2022 1448   CL 102 02/20/2022 1448   CO2 23 02/20/2022 1448   GLUCOSE 103 (H) 02/20/2022 1448   BUN 16 02/20/2022 1448   CREATININE 0.97 02/20/2022 1448   CREATININE 0.69 03/11/2018 1648   CALCIUM 9.4 02/20/2022 1448   PROT 7.4 02/20/2022 1448   ALBUMIN 4.6 02/20/2022 1448   AST 31 02/20/2022 1448   ALT 22 02/20/2022 1448   ALKPHOS 48 02/20/2022 1448   BILITOT 0.7 02/20/2022 1448   GFRNONAA 60 (L) 02/20/2022 1448   GFRNONAA 86 03/11/2018 1648   GFRAA >60 05/12/2019 1008   GFRAA 99 03/11/2018 1648   Lab Results  Component Value Date   CHOL 142 03/28/2021   HDL 64.60 03/28/2021   LDLCALC 47 03/28/2021   LDLDIRECT 53.0 09/02/2018   TRIG 151.0 (H) 03/28/2021   CHOLHDL 2  03/28/2021   Lab Results  Component Value Date   HGBA1C 6.3 03/28/2021   Lab Results  Component Value Date   VITAMINB12 190 12/29/2021   Lab Results  Component Value Date   TSH 1.98 02/03/2020     02/20/22 MRI brain 1. No acute intracranial abnormality. 2. Old hemorrhage in the left basal ganglia. 3. Findings of chronic ischemic microangiopathy and volume loss.  12/28/21 CTA head / neck 1. Redemonstrated small area of acute hemorrhage along the lateral margin of the left lateral ventricle, which is minimally increased in size compared to the CT head from 2:07 p.m. no additional acute intracranial process. 2. No intracranial large vessel occlusion. Mild stenosis in the cavernous and supraclinoid ICAs, and mild-to-moderate stenosis in the proximal right V4.  12/22/21 MRI brain 1. No acute intracranial abnormality. 2. Advanced chronic small vessel ischemic disease and cerebral volume loss.     ASSESSMENT AND PLAN  81 y.o. year old female here with:   Dx:  1. Gait difficulty   2. Memory loss      PLAN:  GAIT DIFF  - check MRI cervical / lumbar spine - use walker - no driving for now - continue PT exercises  MEMORY / COGNITIVE DECLINE -  vascular cognitive impairment; post concussion syndrome; B12 deficiency  B12 deficiency - continue B12 supplements; follow up PCP  LIGHTHEADEDNESS  - follow up with PCP; consider cardiac workup for presyncope  Orders Placed This Encounter  Procedures   MR CERVICAL SPINE WO CONTRAST   MR LUMBAR SPINE WO CONTRAST   Return for pending if symptoms worsen or fail to improve, pending test results.  I spent 60 minutes of face-to-face and non-face-to-face time with patient.  This included previsit chart review, lab review, study review, order entry, electronic health record documentation, patient education.     Suanne Marker, MD 03/07/2022, 12:59 PM Certified in Neurology, Neurophysiology and Neuroimaging  [facility]  [facility] [address], [address] [place], [place] [address] [phone]

## 2022-03-08 ENCOUNTER — Other Ambulatory Visit: Payer: Self-pay | Admitting: Family Medicine

## 2022-03-08 ENCOUNTER — Telehealth: Payer: Self-pay | Admitting: Diagnostic Neuroimaging

## 2022-03-08 NOTE — Telephone Encounter (Signed)
[employer] sent to [facility] they obtain auth  

## 2022-03-19 ENCOUNTER — Other Ambulatory Visit: Payer: Medicare HMO

## 2022-03-20 ENCOUNTER — Other Ambulatory Visit: Payer: Self-pay | Admitting: Family Medicine

## 2022-03-20 ENCOUNTER — Telehealth: Payer: Self-pay

## 2022-03-20 ENCOUNTER — Telehealth: Payer: Self-pay | Admitting: Family Medicine

## 2022-03-20 NOTE — Telephone Encounter (Signed)
Patient's daughter called b/c mom's balance and cognitive behavior are all of whack.  Patient continues to have falls and she is thinking it may be b/c of her kidneys.  The patient is scheduled 09/18 with Kidney doctor.  Daughter is concerned and wanted to know what can be done and if she can be seen sooner.

## 2022-03-20 NOTE — Telephone Encounter (Signed)
  Spoke with pts daughter [name]) and  she believes her referral should be urgent to the kidney specialist. They have an appt 9/18. She believes her mother has a kidney infection that has reached her blood stream which is causing the balance issues and cognitive issues. Pt has fallen twice last week with no reports of head injury. Pt had an appt with neurologist a week ago and they want to schedule an MRI on her neck and torso. Pts daughter would like to know if there is  a blood test that can be done here to see if she has an infection and if she can been seen sooner by the kidney specialist?  I told her one thing she can always do in the mean time is call and ask if they have any cancellations to try and get in sooner but I told her I would pass the info along to see what we can do on our end.    Inda Coke routed conversation to Allean Found, CMA 25 minutes ago (2:38 PM)   Inda Coke 25 minutes ago (2:38 PM)   CW Patient's daughter called b/c mom's balance and cognitive behavior are all of whack.  Patient continues to have falls and she is thinking it may be b/c of her kidneys.  The patient is scheduled 09/18 with Kidney doctor.  Daughter is concerned and wanted to know what can be done and if she can be seen sooner.        Note

## 2022-03-21 NOTE — Telephone Encounter (Signed)
I think the time frame for the urology appointment is ok. She is seeing them due to microscopic blood in her urine so they can evaluate that further. The patient has had a couple of urine checks over the past couple of months and there was no infection on the culture from 01/06/22 and there was not any concerning finding regarding infection on the urinalysis from 02/20/22. Her white blood cell count has also been normal which would argue against there being a blood stream infection. We can certainly recheck her urine though I do not have a specific reason at this time to check for a blood stream infection thus her insurance would likely not cover it. She can be scheduled for a urinalysis and if needed can be set up for a follow-up with me for this as well.

## 2022-03-21 NOTE — Telephone Encounter (Signed)
Patient called me back and I explained to her that the provider stated the appointment she is scheduled on 04/24/2022 with nephrology is okay and to keep that appointment and she understood.  Billy Turvey,cma

## 2022-03-21 NOTE — Telephone Encounter (Signed)
Patient states she is returning Nina Gonzalez, CMA's call.  I transferred call to Nina. 

## 2022-03-21 NOTE — Telephone Encounter (Signed)
LVM for patient to call back.   Evangelyn Crouse,cma  

## 2022-04-04 NOTE — Progress Notes (Signed)
Sorry I cannot answer your question I cannot find any evidence of me ever seeing this patient.

## 2022-04-13 ENCOUNTER — Other Ambulatory Visit: Payer: Medicare HMO

## 2022-04-19 ENCOUNTER — Other Ambulatory Visit: Payer: Medicare HMO

## 2022-04-20 ENCOUNTER — Encounter: Payer: Self-pay | Admitting: Diagnostic Neuroimaging

## 2022-04-20 ENCOUNTER — Telehealth: Payer: Self-pay | Admitting: Diagnostic Neuroimaging

## 2022-04-20 NOTE — Telephone Encounter (Signed)
Pt requesting MRI sent to [facility]  Imaging.Phone: [phone]

## 2022-04-20 NOTE — Telephone Encounter (Signed)
error 

## 2022-04-24 NOTE — Telephone Encounter (Signed)
[employer] medicare Josem Kaufmann: [id number] exp. 09/16/22 sent to [place] scheduling to schedule at [facility]. [phone] is their phone number for scheduling.

## 2022-04-26 ENCOUNTER — Telehealth: Payer: Self-pay | Admitting: Family Medicine

## 2022-04-26 NOTE — Telephone Encounter (Signed)
Pt called stating she is having an MRI on Monday and she would like something to relax her because she is claustrophobia

## 2022-04-27 ENCOUNTER — Other Ambulatory Visit: Payer: Self-pay | Admitting: Family

## 2022-04-27 ENCOUNTER — Other Ambulatory Visit: Payer: Self-pay | Admitting: Family Medicine

## 2022-04-27 MED ORDER — DIAZEPAM 2 MG PO TABS: 2.0000 mg | ORAL_TABLET | ORAL | 0 refills | Status: DC

## 2022-04-27 NOTE — Telephone Encounter (Signed)
Lvm to inform pt that Valium has been sent to pt's pharmacy for pt to take 30 mins prior to MRI.

## 2022-05-01 ENCOUNTER — Ambulatory Visit
Admission: RE | Admit: 2022-05-01 | Discharge: 2022-05-01 | Disposition: A | Discharge status: Home / Self Care | Payer: Medicare HMO | Source: Ambulatory Visit | Attending: Diagnostic Neuroimaging | Admitting: Diagnostic Neuroimaging

## 2022-05-01 DIAGNOSIS — R413 Other amnesia: Secondary | ICD-10-CM | POA: Diagnosis present

## 2022-05-01 DIAGNOSIS — R269 Unspecified abnormalities of gait and mobility: Secondary | ICD-10-CM

## 2022-05-02 ENCOUNTER — Other Ambulatory Visit: Payer: Self-pay | Admitting: Nephrology

## 2022-05-02 DIAGNOSIS — R809 Proteinuria, unspecified: Secondary | ICD-10-CM

## 2022-05-02 DIAGNOSIS — R319 Hematuria, unspecified: Secondary | ICD-10-CM

## 2022-05-02 DIAGNOSIS — R8290 Unspecified abnormal findings in urine: Secondary | ICD-10-CM

## 2022-05-05 ENCOUNTER — Other Ambulatory Visit: Payer: Self-pay | Admitting: Family Medicine

## 2022-05-08 ENCOUNTER — Other Ambulatory Visit: Payer: Self-pay | Admitting: Family Medicine

## 2022-05-09 ENCOUNTER — Encounter: Payer: Self-pay | Admitting: Family Medicine

## 2022-05-09 ENCOUNTER — Ambulatory Visit (INDEPENDENT_AMBULATORY_CARE_PROVIDER_SITE_OTHER): Payer: Medicare HMO | Admitting: Family Medicine

## 2022-05-09 ENCOUNTER — Ambulatory Visit (INDEPENDENT_AMBULATORY_CARE_PROVIDER_SITE_OTHER): Payer: Medicare HMO

## 2022-05-09 VITALS — BP 140/70 | HR 92 | Temp 98.8°F | Ht 59.0 in | Wt 136.8 lb

## 2022-05-09 DIAGNOSIS — R296 Repeated falls: Secondary | ICD-10-CM

## 2022-05-09 DIAGNOSIS — M25551 Pain in right hip: Secondary | ICD-10-CM | POA: Diagnosis not present

## 2022-05-09 DIAGNOSIS — M5030 Other cervical disc degeneration, unspecified cervical region: Secondary | ICD-10-CM

## 2022-05-09 DIAGNOSIS — F32A Depression, unspecified: Secondary | ICD-10-CM

## 2022-05-09 DIAGNOSIS — I10 Essential (primary) hypertension: Secondary | ICD-10-CM

## 2022-05-09 DIAGNOSIS — Z23 Encounter for immunization: Secondary | ICD-10-CM

## 2022-05-09 DIAGNOSIS — R2689 Other abnormalities of gait and mobility: Secondary | ICD-10-CM

## 2022-05-09 NOTE — Assessment & Plan Note (Signed)
Some depression though she notes she is dealing with this adequately.  She will continue Wellbutrin 300 mg daily and venlafaxine 150 mg daily.

## 2022-05-09 NOTE — Assessment & Plan Note (Signed)
Adequately controlled for age.  She will continue HCTZ 25 mg daily.

## 2022-05-09 NOTE — Assessment & Plan Note (Signed)
Patient has seen neurology.  I encouraged her to follow-up with them on the results of her MRIs to see what their thoughts are on the findings.  Given the cervical degenerative disc changes with the disc abutting the spinal cord we will have her see neurosurgery to get their input.  Referral placed.

## 2022-05-09 NOTE — Progress Notes (Signed)
[name], MD Phone: [phone]  Ashley [name] is a 81 y.o. female who presents today for follow-up.  Depression: Patient notes this is worse at times.  Note she has been dealing with it okay.  Notes not being able to work or drive worsens this.  She is on Wellbutrin and Effexor.  No SI.  Hypertension: Not checking blood pressures at home.  She is taking HCTZ.  No chest pain, shortness of breath, or edema.  Balance difficulty: Patient saw neurology.  They had her complete a cervical spine and lumbar spine MRI.  She notes she has not heard about the results from them yet.  She last fell a couple weeks ago onto her right hip and has a large bruise in that area.  She notes when she falls her legs start to feel weak and she has no balance.  She notes she saw ENT previously for the her balance difficulty though they noted no balance issues related to her ears.  She notes no lightheadedness.  No presyncope.  Social History   Tobacco Use  Smoking Status Former   Packs/day: 0.50   Years: 15.00   Total pack years: 7.50   Types: Cigarettes   Quit date: 08/07/1976   Years since quitting: 45.7  Smokeless Tobacco Never    Current Outpatient Medications on File Prior to Visit  Medication Sig Dispense Refill   acetaminophen (TYLENOL) 650 MG CR tablet Take 1,300 mg by mouth every 8 (eight) hours as needed for pain.     aspirin EC 81 MG tablet Take 81 mg by mouth daily. Swallow whole.     buPROPion (WELLBUTRIN XL) 300 MG 24 hr tablet Take 1 tablet (300 mg total) by mouth daily. 90 tablet 1   celecoxib (CELEBREX) 200 MG capsule Take 1 capsule (200 mg total) by mouth 2 (two) times daily as needed. Home med.     diazepam (VALIUM) 2 MG tablet Take 1 tablet (2 mg total) by mouth 30 (thirty) minutes before procedure for 1 dose. 2 tablet 0   EPINEPHrine (EPIPEN 2-PAK) 0.3 mg/0.3 mL IJ SOAJ injection Inject 0.3 mLs (0.3 mg total) into the muscle as needed for anaphylaxis. 1 each 1   fluticasone  (FLONASE) 50 MCG/ACT nasal spray Place 1 spray into both nostrils daily.     hydrochlorothiazide (HYDRODIURIL) 25 MG tablet TAKE 1 TABLET BY MOUTH DAILY 90 tablet 1   meclizine (ANTIVERT) 12.5 MG tablet TAKE ONE TABLET BY MOUTH 3 TIMES DAILY AS NEEDED FOR DIZZINESS 30 tablet 0   Melatonin 10 MG TABS Take 10 mg by mouth at bedtime.     NASACORT ALLERGY 24HR 55 MCG/ACT AERO nasal inhaler 2 sprays daily.     omeprazole (PRILOSEC) 20 MG capsule TAKE 1 CAPSULE BY MOUTH ONCE DAILY 30 capsule 5   OVER THE COUNTER MEDICATION Take 2 tablets by mouth daily. Glucocil     Polyethyl Glycol-Propyl Glycol (SYSTANE) 0.4-0.3 % SOLN Place 1 drop into both eyes 3 (three) times daily as needed (dry/irritated eyes.).     rosuvastatin (CRESTOR) 40 MG tablet TAKE 1 TABLET BY MOUTH ONCE DAILY 90 tablet 1   traMADol (ULTRAM) 50 MG tablet Take 25-50 mg by mouth 2 (two) times daily as needed.     venlafaxine XR (EFFEXOR-XR) 150 MG 24 hr capsule TAKE 1 CAPSULE BY MOUTH EVERY DAY 90 capsule 3   No current facility-administered medications on file prior to visit.     ROS see history of present illness  Objective  Physical Exam Vitals:   05/09/22 1214  BP: (!) 140/70  Pulse: 92  Temp: 98.8 F (37.1 C)  SpO2: 94%    BP Readings from Last 3 Encounters:  05/09/22 (!) 140/70  03/07/22 130/84  02/21/22 110/70   Wt Readings from Last 3 Encounters:  05/09/22 136 lb 12.8 oz (62.1 kg)  03/07/22 140 lb 6.4 oz (63.7 kg)  02/21/22 140 lb 12.8 oz (63.9 kg)    Physical Exam Constitutional:      General: She is not in acute distress.    Appearance: She is not diaphoretic.  Cardiovascular:     Rate and Rhythm: Normal rate and regular rhythm.     Heart sounds: Normal heart sounds.  Pulmonary:     Effort: Pulmonary effort is normal.     Breath sounds: Normal breath sounds.  Musculoskeletal:     Comments: Right hip with large bruise laterally with underlying hematoma noted, there is tenderness in this area   Skin:    General: Skin is warm and dry.  Neurological:     Mental Status: She is alert.      Assessment/Plan: Please see individual problem list.  Problem List Items Addressed This Visit     Balance problem (Chronic)    Patient has seen neurology.  I encouraged her to follow-up with them on the results of her MRIs to see what their thoughts are on the findings.  Given the cervical degenerative disc changes with the disc abutting the spinal cord we will have her see neurosurgery to get their input.  Referral placed.      Depression (Chronic)    Some depression though she notes she is dealing with this adequately.  She will continue Wellbutrin 300 mg daily and venlafaxine 150 mg daily.      Essential hypertension (Chronic)    Adequately controlled for age.  She will continue HCTZ 25 mg daily.      Frequent falls (Chronic)    Recent fall with injury to her right hip.  We will get an x-ray today.  Discussed it would take quite some time for the hematoma and bruising to improve.      DDD (degenerative disc disease), cervical - Primary    New advanced cervical degenerative changes noted.  Referral to neurosurgery to evaluate for treatment options.  MRI lumbar spine and MRI cervical spine reviewed.      Relevant Orders   Ambulatory referral to Neurosurgery   Other Visit Diagnoses     Need for immunization against influenza       Relevant Orders   Flu Vaccine QUAD High Dose(Fluad) (Completed)   Right hip pain       Relevant Orders   DG HIP UNILAT WITH PELVIS 2-3 VIEWS RIGHT        Return in about 3 months (around 08/09/2022).   Tommi Rumps, MD [place]

## 2022-05-09 NOTE — Assessment & Plan Note (Signed)
Recent fall with injury to her right hip.  We will get an x-ray today.  Discussed it would take quite some time for the hematoma and bruising to improve.

## 2022-05-09 NOTE — Patient Instructions (Signed)
Nice to see you. We will get an x-ray of your right hip today and contact you with the results. Neurosurgery should contact you to set up an evaluation for your degenerative issues in your neck and to help determine if this is contributing to your falls. Please follow-up with neurology to see what they think of your MRI results.

## 2022-05-09 NOTE — Assessment & Plan Note (Addendum)
New advanced cervical degenerative changes noted.  Referral to neurosurgery to evaluate for treatment options.  MRI lumbar spine and MRI cervical spine reviewed.

## 2022-05-11 ENCOUNTER — Telehealth: Payer: Self-pay | Admitting: Family Medicine

## 2022-05-11 NOTE — Telephone Encounter (Signed)
Pt called stating she would like to be called regarding some information that the provider needed from her

## 2022-05-11 NOTE — Telephone Encounter (Signed)
I called and spoke with the patient and the neurologist name is Dr. Leta Baptist of [facility].  Julanne Schlueter,cma

## 2022-05-11 NOTE — Telephone Encounter (Signed)
Neurologist in [place] said to feel free to contact him.

## 2022-05-12 ENCOUNTER — Ambulatory Visit
Admission: RE | Admit: 2022-05-12 | Discharge: 2022-05-12 | Disposition: A | Discharge status: Home / Self Care | Payer: Medicare HMO | Source: Ambulatory Visit | Attending: Nephrology | Admitting: Nephrology

## 2022-05-12 DIAGNOSIS — R319 Hematuria, unspecified: Secondary | ICD-10-CM | POA: Insufficient documentation

## 2022-05-12 DIAGNOSIS — R8290 Unspecified abnormal findings in urine: Secondary | ICD-10-CM | POA: Insufficient documentation

## 2022-05-12 DIAGNOSIS — R809 Proteinuria, unspecified: Secondary | ICD-10-CM | POA: Diagnosis present

## 2022-05-12 NOTE — Telephone Encounter (Signed)
Neurology commented on the scans today. I have already referred to neurosurgery for the findings.

## 2022-05-29 NOTE — Progress Notes (Unsigned)
Referring Physician:  Glori Luis, MD [address] [place],  [place] [address]  Primary Physician:  Glori Luis, MD  History of Present Illness: 06/01/2022 [name] has been seeing neurology for chronic balance issues. Referred here by her PCP for her lumbar spine.   She has right posterior thigh pain to her knee that is worse when she gets up from sitting position x 2 months. She has no pain once she starts walking. She has intermittent LBP that is tolerable. No numbness or tingling.   She does not smoke.   She is s/p L3-L5 laminectomy by Dr. Gearlean Alf on 11/19/17. She did well after this surgery.   No bowel or bladder issues.   No neck pain. No arm pain. No numbness, tingling, or weakness. No issues with dexterity. Her balance has improved in last month.   Conservative measures:  Physical therapy: no  Multimodal medical therapy including regular antiinflammatories: tramadol  Injections:  12/16/2021: Right L5-S1 and right S1 transforaminal ESI 07/27/2021: Right S1 transforaminal ESI (aborted right L5-S1 due to patient tolerance) 05/07/2020: Right L5-S1 and right S1 transforaminal ESI 02/23/2020: Left L5-S1 and left S1 transforaminal ESI (good relief) 12/29/2019: Right L5-S1 and right S1 transforaminal ESI (good relief) 04/22/2019: Right L5-S1 and right S1 transforaminal ESI (good relief) 02/11/2019: Left L5-S1 and left S1 transforaminal ESI (moderate to good relief) 01/07/2019: Left L5-S1 and Left S1 transforaminal ESI (moderate relief)    Past Surgery: L3-L5 laminectomy by Dr. 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 No falls in last month. No issues with dexterity. No weakness. She has seen neurology- recommended referral to [place] versus PT.   The symptoms are causing a significant impact on the patient's life.   Review of Systems:  A 10 point review  of systems is negative, except for the pertinent positives and negatives detailed in the HPI.  Past Medical History: Past Medical History:  Diagnosis Date   Abnormal uterine bleeding (AUB)    a. benign endometrial polyps on D+C in 03/2017   Anxiety    Arthritis    Carpal tunnel syndrome of left wrist    Cerebral hemorrhage (HCC) 2023   Chicken pox    Depression    GERD (gastroesophageal reflux disease)    History of stress test    a. 08/2017 Lexiscan MV: EF 55-65%. No ischemia/infarct. Low risk.   Hyperlipidemia    Hypertension    Lacunar infarction (HCC)    a. Dx in setting of dizziness/vertigo; b. 02/2018 Carotid U/S: bilateral intimal thickening and atherosclerotic plaque. No hemodynamically significant stenoses; c. 03/2018 MRI Brain: No mass or abnl enhancement. Chronic microvascular ischemic changes and mod volume loss of the brain. 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   Spinal stenosis of lumbar region with radiculopathy 2019   Stroke (HCC)    PT STATES SHE WAS TOLD THIS BY HER PCP BUT PT STATES SHE NEVER KNEW SHE HAD A STROKE-NO RESIDUAL EFFECTS   Vertigo     Past Surgical History: Past Surgical History:  Procedure Laterality Date   BACK SURGERY  11/19/2017   lumbar laminectomy   CARPAL TUNNEL RELEASE Right 07/30/2019   CARPAL TUNNEL RELEASE Left 02/21/2021   Procedure: CARPAL TUNNEL RELEASE;  Surgeon: Donato Heinz, MD;  Location: [facility];  Service: Orthopedics;  Laterality: Left;   COLONOSCOPY     2003, 2011, 2021  COLONOSCOPY WITH PROPOFOL N/A 12/24/2019   Procedure: COLONOSCOPY WITH PROPOFOL;  Surgeon: Toledo, Boykin Nearing, MD;  Location: [facility];  Service: Gastroenterology;  Laterality: N/A;   CYSTOSCOPY MACROPLASTIQUE IMPLANT N/A 05/15/2019   Procedure: CYSTOSCOPY MACROPLASTIQUE IMPLANT;  Surgeon: Orson Ape, MD;  Location: [facility];  Service: Urology;  Laterality: N/A;   DILATATION & CURETTAGE/HYSTEROSCOPY WITH MYOSURE N/A  03/30/2017   Procedure: DILATATION & CURETTAGE/HYSTEROSCOPY WITH MYOSURE;  Surgeon: Schermerhorn, Ihor Austin, MD;  Location: [facility];  Service: Gynecology;  Laterality: N/A;   EYE SURGERY Bilateral    Cataract Extraction with IOL   JOINT REPLACEMENT     KNEE ARTHROPLASTY Left 12/01/2020   Procedure: COMPUTER ASSISTED TOTAL KNEE ARTHROPLASTY;  Surgeon: Donato Heinz, MD;  Location: [facility];  Service: Orthopedics;  Laterality: Left;   SKIN LESION EXCISION Right    synovial cyst right AC joint   TONSILLECTOMY AND ADENOIDECTOMY     Age 15    Allergies: Allergies as of 06/01/2022 - Review Complete 06/01/2022  Allergen Reaction Noted   Ace inhibitors Other (See Comments) 01/16/2017   Pravastatin Other (See Comments) 12/15/2015   Simvastatin Other (See Comments) 06/28/2015    Medications: Outpatient Encounter Medications as of 06/01/2022  Medication Sig   acetaminophen (TYLENOL) 650 MG CR tablet Take 1,300 mg by mouth every 8 (eight) hours as needed for pain.   aspirin EC 81 MG tablet Take 81 mg by mouth daily. Swallow whole.   buPROPion (WELLBUTRIN XL) 300 MG 24 hr tablet Take 1 tablet (300 mg total) by mouth daily.   EPINEPHrine (EPIPEN 2-PAK) 0.3 mg/0.3 mL IJ SOAJ injection Inject 0.3 mLs (0.3 mg total) into the muscle as needed for anaphylaxis.   fluticasone (FLONASE) 50 MCG/ACT nasal spray Place 1 spray into both nostrils daily.   hydrochlorothiazide (HYDRODIURIL) 25 MG tablet TAKE 1 TABLET BY MOUTH DAILY   meclizine (ANTIVERT) 12.5 MG tablet TAKE ONE TABLET BY MOUTH 3 TIMES DAILY AS NEEDED FOR DIZZINESS   Melatonin 10 MG TABS Take 10 mg by mouth at bedtime.   NASACORT ALLERGY 24HR 55 MCG/ACT AERO nasal inhaler 2 sprays daily.   omeprazole (PRILOSEC) 20 MG capsule TAKE 1 CAPSULE BY MOUTH ONCE DAILY   OVER THE COUNTER MEDICATION Take 2 tablets by mouth daily. Glucocil   Polyethyl Glycol-Propyl Glycol (SYSTANE) 0.4-0.3 % SOLN Place 1 drop into both eyes 3 (three) times daily as  needed (dry/irritated eyes.).   rosuvastatin (CRESTOR) 40 MG tablet TAKE 1 TABLET BY MOUTH ONCE DAILY   traMADol (ULTRAM) 50 MG tablet Take 25-50 mg by mouth 2 (two) times daily as needed.   venlafaxine XR (EFFEXOR-XR) 150 MG 24 hr capsule TAKE 1 CAPSULE BY MOUTH EVERY DAY   [DISCONTINUED] celecoxib (CELEBREX) 200 MG capsule Take 1 capsule (200 mg total) by mouth 2 (two) times daily as needed. Home med. (Patient not taking: Reported on 06/01/2022)   [DISCONTINUED] diazepam (VALIUM) 2 MG tablet Take 1 tablet (2 mg total) by mouth 30 (thirty) minutes before procedure for 1 dose. (Patient not taking: Reported on 06/01/2022)   No facility-administered encounter medications on file as of 06/01/2022.    Social History: Social History   Tobacco Use   Smoking status: Former    Packs/day: 0.50    Years: 15.00    Total pack years: 7.50    Types: Cigarettes    Quit date: 08/07/1976    Years since quitting: 45.8   Smokeless tobacco: Never  Vaping Use   Vaping Use:  Never used  Substance Use Topics   Alcohol use: Yes    Alcohol/week: 1.0 standard drink of alcohol    Types: 1 Glasses of wine per week    Comment: ocassionally   Drug use: No    Family Medical History: Family History  Problem Relation Age of Onset   Rheum arthritis Mother    Ovarian cancer Sister    COPD Sister    CAD Sister    Heart disease Father    Aneurysm Father    Cancer Brother    Thrombosis Maternal Grandmother        a. DVT   Thrombosis Maternal Grandfather        a. DVT   Thrombosis Paternal Grandmother        a. DVT   Thrombosis Paternal Grandfather        a. DVT    Physical Examination: Vitals:   06/01/22 0853  BP: 128/82    General: Patient is well developed, well nourished, calm, collected, and in no apparent distress. Attention to examination is appropriate.  Respiratory: Patient is breathing without any difficulty.   NEUROLOGICAL:     Awake, alert, oriented to person, place, and time.   Speech is clear and fluent. Fund of knowledge is appropriate.   Cranial Nerves: Pupils equal round and reactive to light.  Facial tone is symmetric.  Facial sensation is symmetric.  No posterior cervical or posterior lumbar tenderness.   Well healed lumbar incision.   No abnormal lesions on exposed skin.   Strength: Side Biceps Triceps Deltoid Interossei Grip Wrist Ext. Wrist Flex.  R 5 5 5 5 5 5 5   L 5 5 5 5  +4 5 5    Side Iliopsoas Quads Hamstring PF DF EHL  R 5 5 5 5 5 5   L 5 5 5 5 5 5    Reflexes are 1+ and symmetric at the biceps, triceps, brachioradialis, patella and achilles.   Hoffman's is absent.  Clonus is not present.   Bilateral upper and lower extremity sensation is intact to light touch.     She has tenderness over right hamstring with tightness noted.   Gait is normal.    Medical Decision Making  Imaging: Lumbar MRI scan dated 05/01/22:  FINDINGS: Segmentation: Normal on the 2018 CT, same numbering system used on the prior MRI.   Alignment: Mildly increased lumbar lordosis since 2020. Mild levoconvex lumbar scoliosis. No significant spondylolisthesis.   Vertebrae: Patchy acute on chronic degenerative endplate marrow signal changes L3-L4 through L5-S1. No confluent marrow edema. Normal background bone marrow signal. Intact visible sacrum and SI joints.   Conus medullaris and cauda equina: Conus extends to the L1-L2 level. No lower spinal cord or conus signal abnormality.   Paraspinal and other soft tissues: Postoperative changes to the posterior lumbar paraspinal soft tissues L3 through L5. No paraspinal fluid collection. Negative for age visible abdominal viscera.   Disc levels:   Mild lower thoracic disc bulging. No lower thoracic spinal stenosis, and T12-L1 is negative for age.   L1-L2: Mild disc bulging and facet hypertrophy. No significant stenosis.   L2-L3: Mild mostly far lateral disc bulging. Mild epidural lipomatosis and mild to moderate  facet and ligament flavum hypertrophy. Borderline spinal stenosis here is new since 2020. No foraminal stenosis.   L3-L4: Chronic disc space loss and circumferential disc osteophyte complex eccentric to the left. Chronic laminectomy. Mild to moderate residual facet hypertrophy. No spinal or lateral recess stenosis. Moderate to severe left and  moderate right L3 foraminal stenosis appear chronic and stable.   L4-L5: Severe disc space loss. Circumferential disc osteophyte complex with a broad-based and slightly caudal posterior component. Chronic laminectomy here with mild to moderate residual facet hypertrophy. No spinal stenosis. Mild left lateral recess stenosis (descending left L5 nerve level) does not appear significantly changed. Moderate to severe bilateral L4 foraminal stenosis is greater on the right and not significantly changed.   L5-S1: Chronic severe disc space loss with circumferential disc osteophyte complex. Mild to moderate facet hypertrophy. No spinal or convincing lateral recess stenosis. Moderate bilateral L5 foraminal stenosis is stable.   IMPRESSION: 1. Largely stable MRI appearance of the lumbar spine since 2020. 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   2. Advanced chronic disc and endplate degeneration L3-L4 through L5-S1 with previous laminectomies. No residual spinal stenosis but moderate or severe neural foraminal stenosis at the bilateral L3, L4, and L5 nerve levels which has not significantly changed.     Electronically Signed   By: Odessa Fleming M.D.   On: 05/03/2022 08:50  MRI of cervical spine dated 05/01/22:  FINDINGS: Alignment: Chronic straightening and mild reversal of cervical lordosis. Associated degenerative retrolisthesis of C4 on C5 appears mildly progressed since 2018, 2-3 mm. More subtle anterolisthesis of C3 on C4 is also more apparent. Degenerative retrolisthesis of C6 on C7 is  more apparent, up to 2 mm. And mild chronic anterolisthesis of C7 on T1 appears stable.   Vertebrae: Bulky degenerative endplate spurring throughout much of the cervical spine with associated chronic degenerative endplate marrow signal changes. There is faint endplate marrow edema at C5-C6 which appears degenerative,. And a focal 6-7 mm area of degenerative marrow edema or subchondral cyst or geode in the right lamina of C7 (series 7, image 6) which is chronic and slightly smaller since 2018.   Cord: Degenerative spinal cord mass effect detailed below, but no cord edema or myelomalacia identified. Negative visible upper thoracic spinal cord, capacious visible thoracic spinal canal despite multilevel thoracic disc bulging.   Posterior Fossa, vertebral arteries, paraspinal tissues: Cervicomedullary junction is within normal limits. Negative visible posterior fossa. Preserved major vascular flow voids in the neck. Negative visible neck soft tissues and upper lungs.   Disc levels:   C2-C3: Negative disc but moderate facet and ligament flavum hypertrophy here greater on the left. Mild left C3 foraminal stenosis.   C3-C4: Circumferential disc bulging and endplate spurring eccentric to the right. Severe left and moderate right facet hypertrophy. Moderate ligament flavum hypertrophy. Mild spinal stenosis. Up to mild associated cord mass effect. Severe left C4 foraminal stenosis. This level has progressed since 2018.   C4-C5: Retrolisthesis with severe disc space loss. Bulky circumferential disc osteophyte complex. Broad-based posterior element involvement. Bulky right foraminal involvement with moderate bilateral facet hypertrophy. Spinal stenosis is mild to moderate along with cord mass effect (series 8, image 12). Moderate to severe bilateral C5 foraminal stenosis appears greater on the right. This level has progressed.   C5-C6: Severe disc space loss. Circumferential disc  osteophyte complex eccentric to the right. Broad-based mildly lobulated posterior component. Moderate facet and mild to moderate ligament flavum hypertrophy. Mild spinal stenosis and spinal cord mass effect. Mild left but moderate to severe right C6 foraminal stenosis. This level has mildly progressed.   C6-C7: Disc space loss and retrolisthesis. Circumferential disc osteophyte complex eccentric to the left. Mild to moderate facet and ligament flavum hypertrophy. Mild spinal stenosis and  cord mass effect. Mild to moderate left C7 foraminal stenosis. This level is mildly progressed.   C7-T1: Chronic anterolisthesis with moderate to severe facet and mild to moderate ligament flavum hypertrophy. Mild disc/pseudo disc bulging. No spinal stenosis. Moderate to severe left and mild to moderate right C8 foraminal stenosis. This level has mildly progressed.   IMPRESSION: 1. Advanced cervical spine degeneration with multilevel mild spondylolisthesis, progressed since a 2018 MRI. Mild if any degenerative endplate marrow edema and no other acute osseous abnormality.   2. Multifactorial spinal stenosis C3-C4 through C6-C7, up to moderate at C4-C5. Spinal cord mass effect at each level, but no cord edema or myelomalacia is identified.   3. Moderate or severe neural foraminal stenosis at the left C4, bilateral C5, right C6, and bilateral C8 nerve levels.     Electronically Signed   By: Odessa FlemingH  Hall M.D.   On: 05/03/2022 08:44  I have personally reviewed the images and agree with the above interpretation.  Assessment and Plan: Ms. Earlene PlaterDavis is a pleasant 81 y.o. female with primary complaint of intermittent right posterior thigh pain when she gets up from seated position. No pain with walking.   History of s/p L3-L5 laminectomy by Dr. Gearlean AlfGottfried on 11/19/17. She has known lumbar spondylosis with multilevel foraminal stenosis. Current pain appears more muscular in nature- she has tenderness/tightness  over right hamstring.   Also with some balance issues- has been seeing neurology. Balance is improving per patient. No falls in last month. No neck or arm pain.   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 No myelomalacia on MRI.   She has some trace weakness in left grip strength on exam. Negative romberg and she is not hyperreflexic.   Treatment options discussed with patient and following plan made:   - Order for physical therapy for lumbar spine, specifically hamstring stretching [facility]. This pain should improve.  - Will get updated cervical xrays.  - No other symptoms of myelopathy on exam. MRI of cervical spine reviewed with Dr. Myer HaffYarbrough after her visit. He agrees with conservative treatment for now. Can consider formal PT for cervical spine/balance. Will send her message on MyChart.  - Follow up with me in 6 weeks and prn.   I spent a total of 30 minutes in face-to-face and non-face-to-face activities related to this patient's care toda including review of outside records, review of imaging, review of symptoms, physical exam, discussion of differential diagnosis, discussion of treatment options, and documentation.   Thank you for involving me in the care of this patient.   Drake LeachStacy Elias Bordner PA-C Dept. of Neurosurgery

## 2022-06-01 ENCOUNTER — Ambulatory Visit
Admission: RE | Admit: 2022-06-01 | Discharge: 2022-06-01 | Disposition: A | Discharge status: Home / Self Care | Payer: Medicare HMO | Source: Ambulatory Visit | Attending: Orthopedic Surgery | Admitting: Orthopedic Surgery

## 2022-06-01 ENCOUNTER — Ambulatory Visit
Admission: RE | Admit: 2022-06-01 | Discharge: 2022-06-01 | Disposition: A | Discharge status: Home / Self Care | Payer: Medicare HMO | Attending: Orthopedic Surgery | Admitting: Orthopedic Surgery

## 2022-06-01 ENCOUNTER — Encounter: Payer: Self-pay | Admitting: Orthopedic Surgery

## 2022-06-01 ENCOUNTER — Ambulatory Visit: Payer: Medicare HMO | Admitting: Orthopedic Surgery

## 2022-06-01 VITALS — BP 128/82 | Ht 59.0 in | Wt 137.4 lb

## 2022-06-01 DIAGNOSIS — M5030 Other cervical disc degeneration, unspecified cervical region: Secondary | ICD-10-CM

## 2022-06-01 DIAGNOSIS — M25561 Pain in right knee: Secondary | ICD-10-CM | POA: Diagnosis not present

## 2022-06-01 DIAGNOSIS — M4802 Spinal stenosis, cervical region: Secondary | ICD-10-CM

## 2022-06-01 NOTE — Patient Instructions (Signed)
It was so nice to see you today.   I think your right leg pain is from tightness in your hamstring muscle. This should get better.   I sent physical therapy orders to [facility], they should call you to schedule.   I don't think your balance issues are from your neck, but I will review with Dr. Izora Ribas and send you a message in [place].   I want to get some updated xrays of your neck. You can get these at the outpatient imaging center.   I will see you back in 6 weeks. Please do not hesitate to call if you have any questions or concerns. You can also message me in [place].   If you have not heard back about PT in the next week, please call the office so we can help you get these things scheduled.   Geronimo Boot PA-C [phone]

## 2022-06-06 ENCOUNTER — Encounter: Payer: Self-pay | Admitting: Orthopedic Surgery

## 2022-06-06 NOTE — Telephone Encounter (Signed)
Cervical xrays dated 06/01/22:  FINDINGS: 4 mm retrolisthesis of C4 versus C5 on neutral imaging, stable since March 28, 2021. Trace retrolisthesis C5 versus C6 and C6 versus C7, stable. Mild anterolisthesis of C7 versus T1 measuring 3.5 mm is also stable. Alignment remains unchanged on flexion views. Alignment is unchanged on extension views. Degenerative disc disease is most prominent at C4-5, C5-6, and C6-7. Uncovertebral degenerative changes identified. No fractures.   Lung apices are normal.   Carotid calcifications identified in the neck bilaterally. No other abnormalities.   IMPRESSION: 1. Stable retrolisthesis of C4 versus C5, C5 versus C6, and C6 versus C7. Stable anterolisthesis of C7 versus T1. Alignment is unchanged on flexion and extension views. 2. Degenerative disc disease and uncovertebral degenerative changes as above. 3. Carotid calcifications in the neck bilaterally.     Electronically Signed   By: Dorise Bullion III M.D.   On: 06/03/2022 14:50  I have personally reviewed the images and agree with the above interpretation.  Patient sent message regarding xray results. No change in plan.   Of note, she had CTA of head on 12/28/21 that showed both internal carotid arteries are patent to the termini, with calcifications in the distal petrous, cavernous, and supraclinoid portion, which cause up to mild narrowing in the cavernous and supraclinoid portions.  Carotid calcifications seen on cervical xray do no appear to be new.

## 2022-06-20 ENCOUNTER — Ambulatory Visit: Payer: Medicare HMO | Admitting: Urology

## 2022-06-20 VITALS — BP 143/60 | HR 105 | Ht 59.0 in | Wt 137.0 lb

## 2022-06-20 DIAGNOSIS — N3289 Other specified disorders of bladder: Secondary | ICD-10-CM | POA: Diagnosis not present

## 2022-06-20 DIAGNOSIS — N393 Stress incontinence (female) (male): Secondary | ICD-10-CM | POA: Diagnosis not present

## 2022-06-20 DIAGNOSIS — Z87891 Personal history of nicotine dependence: Secondary | ICD-10-CM

## 2022-06-20 NOTE — Progress Notes (Signed)
06/20/2022 4:01 PM   Ashley [name] 1941-05-06 [id number]  Referring provider: Glori Luis, MD [address] [place],  [place] [address]  Chief Complaint  Patient presents with   lesion bladder    HPI: 81 year old female who presents today for further evaluation of incidental bladder mass.  She underwent a CT abdomen pelvis on 05/14/2022 which showed a lobulated 4.2 cm x 2.3 cm x 2.7 cm partially calcified area along the base of the bladder.  Presumably, this had increased from the previous scan although it is unclear which today they are referring to.  There was no obstruction of the urethra and bladder.  Of note, she has a personal history of injectable Macroplastique for stress incontinence by Dr. Sheppard Penton in 2000.  She does still does have some stress urinary incontinence, with laughing coughing and sneezing.  She does not think that the procedure helped much.  She is gotten used to and does not bother at this point.  She is a former remote smoker, at one point did smoke up to 2 times a day but this is for relatively short period of time.  She did have a small amount of blood, 3-6 red blood cells per high-powered field on 01/06/2022 but multiple negative subsequent urinalyses.  She denies any gross hematuria or flank pain.   PMH: Past Medical History:  Diagnosis Date   Abnormal uterine bleeding (AUB)    a. benign endometrial polyps on D+C in 03/2017   Anxiety    Arthritis    Carpal tunnel syndrome of left wrist    Cerebral hemorrhage (HCC) 2023   Chicken pox    Depression    GERD (gastroesophageal reflux disease)    History of stress test    a. 08/2017 Lexiscan MV: EF 55-65%. No ischemia/infarct. Low risk.   Hyperlipidemia    Hypertension    Lacunar infarction (HCC)    a. Dx in setting of dizziness/vertigo; b. 02/2018 Carotid U/S: bilateral intimal thickening and atherosclerotic plaque. No hemodynamically significant stenoses; c. 03/2018 MRI Brain: No mass  or abnl enhancement. Chronic microvascular ischemic changes and mod volume loss of the brain. 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   Spinal stenosis of lumbar region with radiculopathy 2019   Stroke (HCC)    PT STATES SHE WAS TOLD THIS BY HER PCP BUT PT STATES SHE NEVER KNEW SHE HAD A STROKE-NO RESIDUAL EFFECTS   Vertigo     Surgical History: Past Surgical History:  Procedure Laterality Date   BACK SURGERY  11/19/2017   lumbar laminectomy   CARPAL TUNNEL RELEASE Right 07/30/2019   CARPAL TUNNEL RELEASE Left 02/21/2021   Procedure: CARPAL TUNNEL RELEASE;  Surgeon: Donato Heinz, MD;  Location: [facility];  Service: Orthopedics;  Laterality: Left;   COLONOSCOPY     2003, 2011, 2021   COLONOSCOPY WITH PROPOFOL N/A 12/24/2019   Procedure: COLONOSCOPY WITH PROPOFOL;  Surgeon: Toledo, Boykin Nearing, MD;  Location: [facility];  Service: Gastroenterology;  Laterality: N/A;   CYSTOSCOPY MACROPLASTIQUE IMPLANT N/A 05/15/2019   Procedure: CYSTOSCOPY MACROPLASTIQUE IMPLANT;  Surgeon: Orson Ape, MD;  Location: [facility];  Service: Urology;  Laterality: N/A;   DILATATION & CURETTAGE/HYSTEROSCOPY WITH MYOSURE N/A 03/30/2017   Procedure: DILATATION & CURETTAGE/HYSTEROSCOPY WITH MYOSURE;  Surgeon: Schermerhorn, Ihor Austin, MD;  Location: [facility];  Service: Gynecology;  Laterality: N/A;   EYE SURGERY Bilateral    Cataract Extraction with IOL   JOINT REPLACEMENT  KNEE ARTHROPLASTY Left 12/01/2020   Procedure: COMPUTER ASSISTED TOTAL KNEE ARTHROPLASTY;  Surgeon: Donato Heinz, MD;  Location: [facility];  Service: Orthopedics;  Laterality: Left;   SKIN LESION EXCISION Right    synovial cyst right AC joint   TONSILLECTOMY AND ADENOIDECTOMY     Age 42    Home Medications:  Allergies as of 06/20/2022       Reactions   Ace Inhibitors Other (See Comments)   Angioedema   Pravastatin Other (See Comments)   Other reaction(s): Muscle Pain   Simvastatin Other (See Comments)    stiffness        Medication List        Accurate as of June 20, 2022  4:01 PM. If you have any questions, ask your nurse or doctor.          acetaminophen 650 MG CR tablet Commonly known as: TYLENOL Take 1,300 mg by mouth every 8 (eight) hours as needed for pain.   aspirin EC 81 MG tablet Take 81 mg by mouth daily. Swallow whole.   azelastine 0.05 % ophthalmic solution Commonly known as: OPTIVAR Apply to eye.   buPROPion 300 MG 24 hr tablet Commonly known as: WELLBUTRIN XL Take 1 tablet (300 mg total) by mouth daily.   EPINEPHrine 0.3 mg/0.3 mL Soaj injection Commonly known as: EpiPen 2-Pak Inject 0.3 mLs (0.3 mg total) into the muscle as needed for anaphylaxis.   fluticasone 50 MCG/ACT nasal spray Commonly known as: FLONASE Place 1 spray into both nostrils daily.   hydrochlorothiazide 25 MG tablet Commonly known as: HYDRODIURIL TAKE 1 TABLET BY MOUTH DAILY   meclizine 12.5 MG tablet Commonly known as: ANTIVERT TAKE ONE TABLET BY MOUTH 3 TIMES DAILY AS NEEDED FOR DIZZINESS   Melatonin 10 MG Tabs Take 10 mg by mouth at bedtime.   mirabegron ER 25 MG Tb24 tablet Commonly known as: MYRBETRIQ Take by mouth.   Nasacort Allergy 24HR 55 MCG/ACT Aero nasal inhaler Generic drug: triamcinolone 2 sprays daily.   omeprazole 20 MG capsule Commonly known as: PRILOSEC TAKE 1 CAPSULE BY MOUTH ONCE DAILY   OVER THE COUNTER MEDICATION Take 2 tablets by mouth daily. Glucocil   predniSONE 10 MG tablet Commonly known as: DELTASONE Take by mouth.   rosuvastatin 40 MG tablet Commonly known as: CRESTOR TAKE 1 TABLET BY MOUTH ONCE DAILY   Systane 0.4-0.3 % Soln Generic drug: Polyethyl Glycol-Propyl Glycol Place 1 drop into both eyes 3 (three) times daily as needed (dry/irritated eyes.).   traMADol 50 MG tablet Commonly known as: ULTRAM Take 25-50 mg by mouth 2 (two) times daily as needed.   venlafaxine XR 150 MG 24 hr capsule Commonly known as:  EFFEXOR-XR TAKE 1 CAPSULE BY MOUTH EVERY DAY        Allergies:  Allergies  Allergen Reactions   Ace Inhibitors Other (See Comments)    Angioedema   Pravastatin Other (See Comments)    Other reaction(s): Muscle Pain   Simvastatin Other (See Comments)    stiffness    Family History: Family History  Problem Relation Age of Onset   Rheum arthritis Mother    Ovarian cancer Sister    COPD Sister    CAD Sister    Heart disease Father    Aneurysm Father    Cancer Brother    Thrombosis Maternal Grandmother        a. DVT   Thrombosis Maternal Grandfather        a. DVT  Thrombosis Paternal Grandmother        a. DVT   Thrombosis Paternal Grandfather        a. DVT    Social History:  reports that she quit smoking about 45 years ago. Her smoking use included cigarettes. She has a 7.50 pack-year smoking history. She has never used smokeless tobacco. She reports current alcohol use of about 1.0 standard drink of alcohol per week. She reports that she does not use drugs.   Physical Exam: BP (!) 143/60   Pulse (!) 105   Ht 4\' 11"  (1.499 m)   Wt 137 lb (62.1 kg)   BMI 27.67 kg/m   Constitutional:  Alert and oriented, No acute distress. HEENT: [place] AT, moist mucus membranes.  Trachea midline, no masses. Cardiovascular: No clubbing, cyanosis, or edema. Respiratory: Normal respiratory effort, no increased work of breathing. Neurologic: Grossly intact, no focal deficits, moving all 4 extremities. Psychiatric: Normal mood and affect.  Laboratory Data: Lab Results  Component Value Date   WBC 8.8 02/20/2022   HGB 13.7 02/20/2022   HCT 43.6 02/20/2022   MCV 92.0 02/20/2022   PLT 322 02/20/2022    Lab Results  Component Value Date   CREATININE 0.97 02/20/2022    Lab Results  Component Value Date   HGBA1C 6.3 03/28/2021    Pertinent Imaging: IMPRESSION: 1. Partially calcified area of increased attenuation along the base of the bladder, increased in size when compared  to the prior study. Correlation with MRI and/or cystoscopy is recommended, as the presence of an underlying neoplastic process cannot be excluded. 2. Small hiatal hernia. 3. Small fat containing left inguinal hernia. 4. Marked severity multilevel degenerative changes throughout the lumbar spine. 5. Aortic atherosclerosis.   Aortic Atherosclerosis (ICD10-I70.0).     Electronically Signed   By: 03/30/2021 M.D.   On: 05/14/2022 00:28   Personally reviewed the above CT scan.  Distribution of mass is in similar/identical proximity to location of Macroplastique injection.  Assessment & Plan:    1. Bladder mass/microscopic hematuria Review of imaging likely suggestive this is residual/iatrogenic secondary to Macroplastique injection however in the setting of smoking as well as isolated episode of microscopic hematuria, strongly recommend cystoscopy to visually confirm this.  We discussed risk and benefits, she is agreeable this plan.  We will consider whether or not she needs dedicated CT urogram based on the results.    2. SUI (stress urinary incontinence, female) Failed Macroplastique injection, minimal bother  3. Former smoker Risk factors above   Follow-up cystoscopy, next available  07/14/2022, MD  [facility] [address], [address] [place], [place] [id number]

## 2022-06-20 NOTE — Patient Instructions (Signed)

## 2022-06-26 ENCOUNTER — Other Ambulatory Visit: Payer: Self-pay | Admitting: Family Medicine

## 2022-06-28 ENCOUNTER — Encounter: Payer: Self-pay | Admitting: Urology

## 2022-06-28 ENCOUNTER — Other Ambulatory Visit: Payer: Self-pay

## 2022-06-28 ENCOUNTER — Ambulatory Visit: Payer: Medicare HMO | Admitting: Urology

## 2022-06-28 ENCOUNTER — Ambulatory Visit
Admission: RE | Admit: 2022-06-28 | Discharge: 2022-06-28 | Disposition: A | Discharge status: Home / Self Care | Payer: Self-pay | Source: Ambulatory Visit | Attending: Orthopedic Surgery | Admitting: Orthopedic Surgery

## 2022-06-28 VITALS — BP 145/95 | HR 105 | Ht 59.0 in | Wt 137.0 lb

## 2022-06-28 DIAGNOSIS — N3289 Other specified disorders of bladder: Secondary | ICD-10-CM

## 2022-06-28 DIAGNOSIS — Z049 Encounter for examination and observation for unspecified reason: Secondary | ICD-10-CM

## 2022-06-28 LAB — URINALYSIS, COMPLETE
Bilirubin, UA: NEGATIVE
Glucose, UA: NEGATIVE
Ketones, UA: NEGATIVE
Nitrite, UA: NEGATIVE
Protein,UA: NEGATIVE
Specific Gravity, UA: 1.015 (ref 1.005–1.030)
Urobilinogen, Ur: 0.2 mg/dL (ref 0.2–1.0)
pH, UA: 6 (ref 5.0–7.5)

## 2022-06-28 LAB — MICROSCOPIC EXAMINATION

## 2022-06-28 NOTE — Progress Notes (Signed)
   06/28/22  CC:  Chief Complaint  Patient presents with   Cysto    HPI: 81 year old female with a personal history of Coaptite injection of the bladder neck and have a possible "bladder mass" along with some small amount of microscopic hematuria.  She presents today for cystoscopy.  Blood pressure (!) 145/95, pulse (!) 105, height 4\' 11"  (1.499 m), weight 137 lb (62.1 kg). NED. A&Ox3.   No respiratory distress   Abd soft, NT, ND Normal external genitalia with patent urethral meatus  Cystoscopy Procedure Note  Patient identification was confirmed, informed consent was obtained, and patient was prepped using Betadine solution.  Lidocaine jelly was administered per urethral meatus.    Procedure: - Flexible cystoscope introduced, without any difficulty.   - Thorough search of the bladder revealed:    normal urethral meatus    no stones    no ulcers     no tumors    no urethral polyps    no trabeculation Ureteral orifices were normal in position and appearance.  Flexion shows a heaped up submucosal nodule which has almost the appearance of a median lobe which is likely the subcu mucosal Coaptite with mass effect into the bladder neck.  Just adjacent to this, there is a small sulcus with some erosion with a scant amount of visible Coaptite material and adjacent inflammation, likely ulcerated area with Coaptite extrusion.  There is quite small, less than 5 mm.  Post-Procedure: - Patient tolerated the procedure well  Assessment/ Plan:  1. Bladder mass Evidence of malignancy, submucosal Coaptite visible on cystoscopy today which gives a resemblance of mass on CT, no concern for malignancy  There is a small area of ulceration, erosion of the material adjacent to the submucosal nodule but however given her minimal symptoms, would not recommend any further intervention for this at this time.  I would like to continue to survey this area, plan for cystoscopy in a year.  This likely  source of her very minimal microscopic blood. - Urinalysis, Complete     Return in about 1 year (around 06/29/2023) for UA  cysto.  07/01/2023, MD

## 2022-07-12 NOTE — Progress Notes (Deleted)
Referring Physician:  Glori Luis, MD [address] [place],  [place] [address]  Primary Physician:  Glori Luis, MD  History of Present Illness: [name] has been seeing neurology for chronic balance issues. She also sees Dr. Yves Dill for her back and had recent ESI.    She was last seen by me on 06/01/22 for intermittent right posterior thigh pain when she gets up from seated position. No pain with walking.    History of s/p L3-L5 laminectomy by Dr. Gearlean Alf on 11/19/17. She has known lumbar spondylosis with multilevel foraminal stenosis. Current pain appeared more muscular in nature- she has tenderness/tightness over right hamstring.    Also with some balance issues- has been seeing neurology. Balance is improving per patient. No falls in last month. No neck or arm pain.    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 No myelomalacia on MRI. Cervical xrays showed trace retrolisthesis C4-C7, mild slip at C7-T1, and severe cervical DDD/spondylosis C4-C7.   She had some trace weakness in left grip strength on exam. Negative romberg and she was not hyperreflexic. Reviewed with Dr. Myer Haff and he recommended conservative treatment.   PT was ordered for lumbar spine and she was sent message to discuss formal PT for cervical spine.      She had right L5-S1 TF and right S1 TF ESI on 07/05/22.     She has right posterior thigh pain to her knee that is worse when she gets up from sitting position x 2 months. She has no pain once she starts walking. She has intermittent LBP that is tolerable. No numbness or tingling.   She does not smoke.   She is s/p L3-L5 laminectomy by Dr. Gearlean Alf on 11/19/17. She did well after this surgery.   No bowel or bladder issues.   No neck pain. No arm pain. No numbness, tingling, or weakness. No issues with dexterity. Her balance has improved in last month.    Conservative measures:  Physical therapy: no  Multimodal medical therapy including regular antiinflammatories: tramadol  Injections:  07/05/22: Right L5-S1 TF and right S1 TF ESI  12/16/2021: Right L5-S1 and right S1 transforaminal ESI 07/27/2021: Right S1 transforaminal ESI (aborted right L5-S1 due to patient tolerance) 05/07/2020: Right L5-S1 and right S1 transforaminal ESI 02/23/2020: Left L5-S1 and left S1 transforaminal ESI (good relief) 12/29/2019: Right L5-S1 and right S1 transforaminal ESI (good relief) 04/22/2019: Right L5-S1 and right S1 transforaminal ESI (good relief) 02/11/2019: Left L5-S1 and left S1 transforaminal ESI (moderate to good relief) 01/07/2019: Left L5-S1 and Left S1 transforaminal ESI (moderate relief)    Past Surgery: L3-L5 laminectomy by Dr. 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 No falls in last month. No issues with dexterity. No weakness. She has seen neurology- recommended referral to [place] versus PT.   The symptoms are causing a significant impact on the patient's life.   Review of Systems:  A 10 point review of systems is negative, except for the pertinent positives and negatives detailed in the HPI.  Past Medical History: Past Medical History:  Diagnosis Date   Abnormal uterine bleeding (AUB)    a. benign endometrial polyps on D+C in 03/2017   Anxiety    Arthritis    Carpal tunnel syndrome of left wrist    Cerebral hemorrhage (HCC) 2023   Chicken pox    Depression  GERD (gastroesophageal reflux disease)    History of stress test    a. 08/2017 Lexiscan MV: EF 55-65%. No ischemia/infarct. Low risk.   Hyperlipidemia    Hypertension    Lacunar infarction (HCC)    a. Dx in setting of dizziness/vertigo; b. 02/2018 Carotid U/S: bilateral intimal thickening and atherosclerotic plaque. No hemodynamically significant stenoses; c. 03/2018 MRI Brain: No mass or  abnl enhancement. Chronic microvascular ischemic changes and mod volume loss of the brain. 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   Spinal stenosis of lumbar region with radiculopathy 2019   Stroke (HCC)    PT STATES SHE WAS TOLD THIS BY HER PCP BUT PT STATES SHE NEVER KNEW SHE HAD A STROKE-NO RESIDUAL EFFECTS   Vertigo     Past Surgical History: Past Surgical History:  Procedure Laterality Date   BACK SURGERY  11/19/2017   lumbar laminectomy   CARPAL TUNNEL RELEASE Right 07/30/2019   CARPAL TUNNEL RELEASE Left 02/21/2021   Procedure: CARPAL TUNNEL RELEASE;  Surgeon: Donato Heinz, MD;  Location: [facility];  Service: Orthopedics;  Laterality: Left;   COLONOSCOPY     2003, 2011, 2021   COLONOSCOPY WITH PROPOFOL N/A 12/24/2019   Procedure: COLONOSCOPY WITH PROPOFOL;  Surgeon: Toledo, Boykin Nearing, MD;  Location: [facility];  Service: Gastroenterology;  Laterality: N/A;   CYSTOSCOPY MACROPLASTIQUE IMPLANT N/A 05/15/2019   Procedure: CYSTOSCOPY MACROPLASTIQUE IMPLANT;  Surgeon: Orson Ape, MD;  Location: [facility];  Service: Urology;  Laterality: N/A;   DILATATION & CURETTAGE/HYSTEROSCOPY WITH MYOSURE N/A 03/30/2017   Procedure: DILATATION & CURETTAGE/HYSTEROSCOPY WITH MYOSURE;  Surgeon: Schermerhorn, Ihor Austin, MD;  Location: [facility];  Service: Gynecology;  Laterality: N/A;   EYE SURGERY Bilateral    Cataract Extraction with IOL   JOINT REPLACEMENT     KNEE ARTHROPLASTY Left 12/01/2020   Procedure: COMPUTER ASSISTED TOTAL KNEE ARTHROPLASTY;  Surgeon: Donato Heinz, MD;  Location: [facility];  Service: Orthopedics;  Laterality: Left;   SKIN LESION EXCISION Right    synovial cyst right AC joint   TONSILLECTOMY AND ADENOIDECTOMY     Age 81    Allergies: Allergies as of 07/13/2022 - Review Complete 06/28/2022  Allergen Reaction Noted   Ace inhibitors Other (See Comments) 01/16/2017   Pravastatin Other (See Comments) 12/15/2015   Simvastatin Other (See  Comments) 06/28/2015    Medications: Outpatient Encounter Medications as of 07/13/2022  Medication Sig   acetaminophen (TYLENOL) 650 MG CR tablet Take 1,300 mg by mouth every 8 (eight) hours as needed for pain.   aspirin EC 81 MG tablet Take 81 mg by mouth daily. Swallow whole.   azelastine (OPTIVAR) 0.05 % ophthalmic solution Apply to eye.   buPROPion (WELLBUTRIN XL) 300 MG 24 hr tablet Take 1 tablet (300 mg total) by mouth daily.   EPINEPHrine (EPIPEN 2-PAK) 0.3 mg/0.3 mL IJ SOAJ injection Inject 0.3 mLs (0.3 mg total) into the muscle as needed for anaphylaxis.   fluticasone (FLONASE) 50 MCG/ACT nasal spray Place 1 spray into both nostrils daily.   hydrochlorothiazide (HYDRODIURIL) 25 MG tablet TAKE 1 TABLET BY MOUTH DAILY   meclizine (ANTIVERT) 12.5 MG tablet TAKE ONE TABLET BY MOUTH 3 TIMES DAILY AS NEEDED FOR DIZZINESS   Melatonin 10 MG TABS Take 10 mg by mouth at bedtime.   NASACORT ALLERGY 24HR 55 MCG/ACT AERO nasal inhaler 2 sprays daily.   omeprazole (PRILOSEC) 20 MG capsule TAKE 1 CAPSULE BY MOUTH ONCE DAILY   OVER THE COUNTER  MEDICATION Take 2 tablets by mouth daily. Glucocil   Polyethyl Glycol-Propyl Glycol (SYSTANE) 0.4-0.3 % SOLN Place 1 drop into both eyes 3 (three) times daily as needed (dry/irritated eyes.).   rosuvastatin (CRESTOR) 40 MG tablet TAKE 1 TABLET BY MOUTH ONCE DAILY   traMADol (ULTRAM) 50 MG tablet Take 25-50 mg by mouth 2 (two) times daily as needed.   venlafaxine XR (EFFEXOR-XR) 150 MG 24 hr capsule TAKE 1 CAPSULE BY MOUTH EVERY DAY   No facility-administered encounter medications on file as of 07/13/2022.    Social History: Social History   Tobacco Use   Smoking status: Former    Packs/day: 0.50    Years: 15.00    Total pack years: 7.50    Types: Cigarettes    Quit date: 08/07/1976    Years since quitting: 45.9   Smokeless tobacco: Never  Vaping Use   Vaping Use: Never used  Substance Use Topics   Alcohol use: Yes    Alcohol/week: 1.0 standard  drink of alcohol    Types: 1 Glasses of wine per week    Comment: ocassionally   Drug use: No    Family Medical History: Family History  Problem Relation Age of Onset   Rheum arthritis Mother    Ovarian cancer Sister    COPD Sister    CAD Sister    Heart disease Father    Aneurysm Father    Cancer Brother    Thrombosis Maternal Grandmother        a. DVT   Thrombosis Maternal Grandfather        a. DVT   Thrombosis Paternal Grandmother        a. DVT   Thrombosis Paternal Grandfather        a. DVT    Physical Examination: There were no vitals filed for this visit.  Awake, alert, oriented to person, place, and time.  Speech is clear and fluent. Fund of knowledge is appropriate.   Cranial Nerves: Pupils equal round and reactive to light.  Facial tone is symmetric.    No posterior cervical or posterior lumbar tenderness.   Well healed lumbar incision.   No abnormal lesions on exposed skin.   Strength: Side Biceps Triceps Deltoid Interossei Grip Wrist Ext. Wrist Flex.  R 5 5 5 5 5 5 5   L 5 5 5 5  +4 5 5    Side Iliopsoas Quads Hamstring PF DF EHL  R 5 5 5 5 5 5   L 5 5 5 5 5 5    Reflexes are 1+ and symmetric at the biceps, triceps, brachioradialis, patella and achilles.   Hoffman's is absent.  Clonus is not present.   Bilateral upper and lower extremity sensation is intact to light touch.     She has tenderness over right hamstring with tightness noted.   Gait is normal.    Medical Decision Making No recent imaging to review.   Assessment and Plan: Ms. Earlene PlaterDavis is a pleasant 81 y.o. female with primary complaint of intermittent right posterior thigh pain when she gets up from seated position. No pain with walking.   History of s/p L3-L5 laminectomy by Dr. Gearlean AlfGottfried on 11/19/17. She has known lumbar spondylosis with multilevel foraminal stenosis. Current pain appears more muscular in nature- she has tenderness/tightness over right hamstring.   Also with some balance  issues- has been seeing neurology. Balance is improving per patient. No falls in last month. No neck or arm pain.   She has known cervical DDD/spondylosis  with multilevel spondylolisthesis causing spinal stenosis C3-C7. No myelomalacia on MRI.   She has some trace weakness in left grip strength on exam. Negative romberg and she is not hyperreflexic.   Treatment options discussed with patient and following plan made:   - Order for physical therapy for lumbar spine, specifically hamstring stretching to [facility]. This pain should improve.  - Will get updated cervical xrays.  - No other symptoms of myelopathy on exam. MRI of cervical spine reviewed with Dr. Myer Haff after her visit. He agrees with conservative treatment for now. Can consider formal PT for cervical spine/balance. Will send her message on MyChart.  - Follow up with me in 6 weeks and prn.   I spent a total of 30 minutes in face-to-face and non-face-to-face activities related to this patient's care toda including review of outside records, review of imaging, review of symptoms, physical exam, discussion of differential diagnosis, discussion of treatment options, and documentation.   Thank you for involving me in the care of this patient.   Drake Leach PA-C Dept. of Neurosurgery

## 2022-07-13 ENCOUNTER — Ambulatory Visit: Payer: Medicare HMO | Admitting: Orthopedic Surgery

## 2022-07-13 NOTE — Progress Notes (Signed)
Referring Physician:  Glori Luis, MD [id number] [place],  [place] [address]  Primary Physician:  Glori Luis, MD  History of Present Illness: [name] has been seeing neurology for chronic balance issues. She also sees Dr. Yves Dill for her back and had recent ESI.    She was last seen by me on 06/01/22 for intermittent right posterior thigh pain when she gets up from seated position. No pain with walking.    History of s/p L3-L5 laminectomy by Dr. Gearlean Alf on 11/19/17. She has known lumbar spondylosis with multilevel foraminal stenosis. Previous pain appeared more muscular in nature- she has tenderness/tightness over right hamstring.    Also with some balance issues- has been seeing neurology. Balance is improving per patient. No falls in last month. No neck or arm pain.    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 No myelomalacia on MRI. Cervical xrays showed trace retrolisthesis C4-C7, mild slip at C7-T1, and severe cervical DDD/spondylosis C4-C7.   PT was ordered for lumbar spine and she was sent message to discuss formal PT for cervical spine.    She went to only one visit of PT for her back/leg and did not go back. She had right L5-S1 TF and right S1 TF ESI on 07/05/22 with Dr. Yves Dill. She saw him back this morning and was given norco and prednisone.   She only got better for a day or two with the above injections.  She now has more constant LBP with right posterior leg pain to her foot. Leg pain is primary compliant. It is worse when she gets up from seated position. This has gotten worse since her last visit.   No numbness or tingling.   Her balance is about the same. No neck pain. No arm pain. No numbness, tingling, or weakness. No issues with dexterity.   Conservative measures:  Physical therapy: one visit at [facility] Multimodal medical therapy including regular  antiinflammatories: tramadol, prednisone, norco Injections:  07/05/22: Right L5-S1 TF and right S1 TF ESI  12/16/2021: Right L5-S1 and right S1 transforaminal ESI 07/27/2021: Right S1 transforaminal ESI (aborted right L5-S1 due to patient tolerance) 05/07/2020: Right L5-S1 and right S1 transforaminal ESI 02/23/2020: Left L5-S1 and left S1 transforaminal ESI (good relief) 12/29/2019: Right L5-S1 and right S1 transforaminal ESI (good relief) 04/22/2019: Right L5-S1 and right S1 transforaminal ESI (good relief) 02/11/2019: Left L5-S1 and left S1 transforaminal ESI (moderate to good relief) 01/07/2019: Left L5-S1 and Left S1 transforaminal ESI (moderate relief)    Past Surgery: L3-L5 laminectomy by Dr. Gearlean Alf on 11/19/17  [name] has no change in balance issues. No issues with dexterity.   The symptoms are causing a significant impact on the patient's life.   Review of Systems:  A 10 point review of systems is negative, except for the pertinent positives and negatives detailed in the HPI.  Past Medical History: Past Medical History:  Diagnosis Date   Abnormal uterine bleeding (AUB)    a. benign endometrial polyps on D+C in 03/2017   Anxiety    Arthritis    Carpal tunnel syndrome of left wrist    Cerebral hemorrhage (HCC) 2023   Chicken pox    Depression    GERD (gastroesophageal reflux disease)    History of stress test    a. 08/2017 Lexiscan MV: EF 55-65%. No ischemia/infarct. Low risk.   Hyperlipidemia    Hypertension    Lacunar infarction (  HCC)    a. Dx in setting of dizziness/vertigo; b. 02/2018 Carotid U/S: bilateral intimal thickening and atherosclerotic plaque. No hemodynamically significant stenoses; c. 03/2018 MRI Brain: No mass or abnl enhancement. Chronic microvascular ischemic changes and mod volume loss of the brain. 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   Spinal stenosis of lumbar region with radiculopathy 2019   Stroke (HCC)    PT  STATES SHE WAS TOLD THIS BY HER PCP BUT PT STATES SHE NEVER KNEW SHE HAD A STROKE-NO RESIDUAL EFFECTS   Vertigo     Past Surgical History: Past Surgical History:  Procedure Laterality Date   BACK SURGERY  11/19/2017   lumbar laminectomy   CARPAL TUNNEL RELEASE Right 07/30/2019   CARPAL TUNNEL RELEASE Left 02/21/2021   Procedure: CARPAL TUNNEL RELEASE;  Surgeon: Donato HeinzHooten, James P, MD;  Location: [facility];  Service: Orthopedics;  Laterality: Left;   COLONOSCOPY     2003, 2011, 2021   COLONOSCOPY WITH PROPOFOL N/A 12/24/2019   Procedure: COLONOSCOPY WITH PROPOFOL;  Surgeon: Toledo, Boykin Nearingeodoro K, MD;  Location: [facility];  Service: Gastroenterology;  Laterality: N/A;   CYSTOSCOPY MACROPLASTIQUE IMPLANT N/A 05/15/2019   Procedure: CYSTOSCOPY MACROPLASTIQUE IMPLANT;  Surgeon: Orson ApeWolff, Michael R, MD;  Location: [facility];  Service: Urology;  Laterality: N/A;   DILATATION & CURETTAGE/HYSTEROSCOPY WITH MYOSURE N/A 03/30/2017   Procedure: DILATATION & CURETTAGE/HYSTEROSCOPY WITH MYOSURE;  Surgeon: Schermerhorn, Ihor Austinhomas J, MD;  Location: [facility];  Service: Gynecology;  Laterality: N/A;   EYE SURGERY Bilateral    Cataract Extraction with IOL   JOINT REPLACEMENT     KNEE ARTHROPLASTY Left 12/01/2020   Procedure: COMPUTER ASSISTED TOTAL KNEE ARTHROPLASTY;  Surgeon: Donato HeinzHooten, James P, MD;  Location: [facility];  Service: Orthopedics;  Laterality: Left;   SKIN LESION EXCISION Right    synovial cyst right AC joint   TONSILLECTOMY AND ADENOIDECTOMY     Age 81    Allergies: Allergies as of 07/17/2022 - Review Complete 07/17/2022  Allergen Reaction Noted   Ace inhibitors Other (See Comments) 01/16/2017   Pravastatin Other (See Comments) 12/15/2015   Simvastatin Other (See Comments) 06/28/2015    Medications: Outpatient Encounter Medications as of 07/17/2022  Medication Sig   gabapentin (NEURONTIN) 100 MG capsule Take 100 mg by mouth at bedtime.   HYDROcodone-acetaminophen (NORCO/VICODIN) 5-325 MG  tablet Take 0.5-1 tablets by mouth 2 (two) times daily as needed.   predniSONE (DELTASONE) 10 MG tablet Take by mouth as directed.   acetaminophen (TYLENOL) 650 MG CR tablet Take 1,300 mg by mouth every 8 (eight) hours as needed for pain.   aspirin EC 81 MG tablet Take 81 mg by mouth daily. Swallow whole.   azelastine (OPTIVAR) 0.05 % ophthalmic solution Apply to eye.   buPROPion (WELLBUTRIN XL) 300 MG 24 hr tablet Take 1 tablet (300 mg total) by mouth daily.   EPINEPHrine (EPIPEN 2-PAK) 0.3 mg/0.3 mL IJ SOAJ injection Inject 0.3 mLs (0.3 mg total) into the muscle as needed for anaphylaxis.   fluticasone (FLONASE) 50 MCG/ACT nasal spray Place 1 spray into both nostrils daily.   hydrochlorothiazide (HYDRODIURIL) 25 MG tablet TAKE 1 TABLET BY MOUTH DAILY   meclizine (ANTIVERT) 12.5 MG tablet TAKE ONE TABLET BY MOUTH 3 TIMES DAILY AS NEEDED FOR DIZZINESS   Melatonin 10 MG TABS Take 10 mg by mouth at bedtime.   NASACORT ALLERGY 24HR 55 MCG/ACT AERO nasal inhaler 2 sprays daily.   omeprazole (PRILOSEC) 20 MG capsule TAKE 1 CAPSULE BY  MOUTH ONCE DAILY   OVER THE COUNTER MEDICATION Take 2 tablets by mouth daily. Glucocil   Polyethyl Glycol-Propyl Glycol (SYSTANE) 0.4-0.3 % SOLN Place 1 drop into both eyes 3 (three) times daily as needed (dry/irritated eyes.).   rosuvastatin (CRESTOR) 40 MG tablet TAKE 1 TABLET BY MOUTH ONCE DAILY   traMADol (ULTRAM) 50 MG tablet Take 25-50 mg by mouth 2 (two) times daily as needed.   venlafaxine XR (EFFEXOR-XR) 150 MG 24 hr capsule TAKE 1 CAPSULE BY MOUTH EVERY DAY   No facility-administered encounter medications on file as of 07/17/2022.    Social History: Social History   Tobacco Use   Smoking status: Former    Packs/day: 0.50    Years: 15.00    Total pack years: 7.50    Types: Cigarettes    Quit date: 08/07/1976    Years since quitting: 45.9   Smokeless tobacco: Never  Vaping Use   Vaping Use: Never used  Substance Use Topics   Alcohol use: Yes     Alcohol/week: 1.0 standard drink of alcohol    Types: 1 Glasses of wine per week    Comment: ocassionally   Drug use: No    Family Medical History: Family History  Problem Relation Age of Onset   Rheum arthritis Mother    Ovarian cancer Sister    COPD Sister    CAD Sister    Heart disease Father    Aneurysm Father    Cancer Brother    Thrombosis Maternal Grandmother        a. DVT   Thrombosis Maternal Grandfather        a. DVT   Thrombosis Paternal Grandmother        a. DVT   Thrombosis Paternal Grandfather        a. DVT    Physical Examination: Vitals:   07/17/22 1134  BP: (!) 137/94  Pulse: 90    Awake, alert, oriented to person, place, and time.  Speech is clear and fluent. Fund of knowledge is appropriate.   Cranial Nerves: Pupils equal round and reactive to light.    No abnormal lesions on exposed skin.   Strength: Side Biceps Triceps Deltoid Interossei Grip Wrist Ext. Wrist Flex.  R 5 5 5 5 5 5 5   L 5 5 5 5 5 5 5    Side Iliopsoas Quads Hamstring PF DF EHL  R 5 5 5 5 5 5   L 5 5 5 5 5 5    Reflexes are 1+ and symmetric at the biceps, triceps, brachioradialis, patella and achilles.   Hoffman's is absent.  Clonus is not present.   Bilateral upper and lower extremity sensation is intact to light touch.     She has mild tenderness over right hamstring with tightness noted.   Gait is normal.    Medical Decision Making No recent imaging to review.   Assessment and Plan: Ms. Leaf is a pleasant 81 y.o. female with more constant LBP with right posterior leg pain to her foot. Leg pain is primary compliant. It is worse when she gets up from seated position. This has gotten worse since her last visit.   History of s/p L3-L5 laminectomy by Dr. on 11/19/17. She has known lumbar spondylosis with multilevel foraminal stenosis. Current pain appears radicular in nature however, she likely has muscular component as well.   Also with some balance issues- has  been seeing neurology. Balance is unchanged. No neck or arm pain.   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 No myelomalacia on MRI.   Treatment options discussed with patient and following plan made:   - Order for physical therapy for lumbar spine to [facility]. She will call to schedule.  - She will start medications from Dr. Yves Dill including prednisone and norco.  - Can consider PT for cervical spine/balance at some point as well.  - She will follow up with me in 6 weeks.   I spent a total of 20 minutes in face-to-face and non-face-to-face activities related to this patient's care today including review of outside records, review of imaging, review of symptoms, physical exam, discussion of differential diagnosis, discussion of treatment options, and documentation.  Drake Leach PA-C Dept. of Neurosurgery

## 2022-07-17 ENCOUNTER — Ambulatory Visit (INDEPENDENT_AMBULATORY_CARE_PROVIDER_SITE_OTHER): Payer: Medicare HMO | Admitting: Orthopedic Surgery

## 2022-07-17 ENCOUNTER — Encounter: Payer: Self-pay | Admitting: Orthopedic Surgery

## 2022-07-17 VITALS — BP 137/94 | HR 90 | Ht 59.0 in | Wt 137.4 lb

## 2022-07-17 DIAGNOSIS — Z9889 Other specified postprocedural states: Secondary | ICD-10-CM

## 2022-07-17 DIAGNOSIS — M5416 Radiculopathy, lumbar region: Secondary | ICD-10-CM | POA: Diagnosis not present

## 2022-07-17 DIAGNOSIS — M4802 Spinal stenosis, cervical region: Secondary | ICD-10-CM | POA: Diagnosis not present

## 2022-07-17 DIAGNOSIS — M5030 Other cervical disc degeneration, unspecified cervical region: Secondary | ICD-10-CM | POA: Diagnosis not present

## 2022-07-17 NOTE — Patient Instructions (Signed)
It was so nice to see you today.   I think your right leg pain is likely coming from your back.   I agree with the medications that Dr. Yves Dill prescribed.   I sent physical therapy orders to [facility]. You need to call them to schedule. Call [phone].   I will see you back in 6 weeks. Please do not hesitate to call if you have any questions or concerns. You can also message me in MyChart.   If you have not heard back about PT in the next week, please call the office so we can help you get these things scheduled.   Drake Leach PA-C [phone]

## 2022-07-24 ENCOUNTER — Telehealth: Payer: Self-pay | Admitting: Licensed Clinical Social Worker

## 2022-07-24 NOTE — Telephone Encounter (Signed)
Patient called asking for resources for her husband with dementia. LCSW encouraged her to call [facility] and provided phone number.

## 2022-07-28 ENCOUNTER — Other Ambulatory Visit: Payer: Self-pay | Admitting: Family Medicine

## 2022-08-11 ENCOUNTER — Telehealth: Payer: Self-pay | Admitting: Family Medicine

## 2022-08-11 ENCOUNTER — Ambulatory Visit: Payer: Medicare HMO | Admitting: Family Medicine

## 2022-08-11 ENCOUNTER — Other Ambulatory Visit: Payer: Self-pay | Admitting: Family Medicine

## 2022-08-11 DIAGNOSIS — F32A Depression, unspecified: Secondary | ICD-10-CM

## 2022-08-11 NOTE — Telephone Encounter (Signed)
Pt called because she wants to inform Dr. Caryl Bis that her spouse Ashley [name] its not doing good in the hospital, and she's mentally doesn't feel right. She would like to be prescribed any meds or any advice she can have from provider. Its too much for her.

## 2022-08-11 NOTE — Telephone Encounter (Signed)
She really needs to have a visit to discuss this so we could figure out what the best option for treatment would be. Can you get her scheduled with me or another provider to discuss this? She could also do a virtual urgent care visit for this if needed as we are heading in to the weekend.

## 2022-08-11 NOTE — Telephone Encounter (Signed)
I called and spoke with the patient and she stated she would go to the [facility] website and schedule a virtual visit with them.  Glenis Musolf,cma

## 2022-08-28 ENCOUNTER — Ambulatory Visit: Payer: Medicare HMO | Admitting: Family Medicine

## 2022-08-28 ENCOUNTER — Encounter: Payer: Self-pay | Admitting: Family Medicine

## 2022-08-28 VITALS — BP 130/78 | HR 94 | Temp 97.6°F | Ht 59.0 in | Wt 140.0 lb

## 2022-08-28 DIAGNOSIS — M48061 Spinal stenosis, lumbar region without neurogenic claudication: Secondary | ICD-10-CM | POA: Diagnosis not present

## 2022-08-28 DIAGNOSIS — F419 Anxiety disorder, unspecified: Secondary | ICD-10-CM

## 2022-08-28 DIAGNOSIS — M5416 Radiculopathy, lumbar region: Secondary | ICD-10-CM | POA: Diagnosis not present

## 2022-08-28 DIAGNOSIS — I10 Essential (primary) hypertension: Secondary | ICD-10-CM

## 2022-08-28 DIAGNOSIS — F32A Depression, unspecified: Secondary | ICD-10-CM

## 2022-08-28 MED ORDER — CLONAZEPAM 0.5 MG PO TABS: 0.2500 mg | ORAL_TABLET | Freq: Two times a day (BID) | ORAL | 0 refills | Status: DC | PRN

## 2022-08-28 MED ORDER — GABAPENTIN 100 MG PO CAPS: 200.0000 mg | ORAL_CAPSULE | Freq: Every day | ORAL | 3 refills | Status: DC

## 2022-08-28 NOTE — Assessment & Plan Note (Addendum)
Chronic issue.  Adequate control for age.  She will continue hydrochlorothiazide 25 mg daily.

## 2022-08-28 NOTE — Patient Instructions (Signed)
Nice to see you. We are going to give you clonazepam to take half a tablet twice daily as needed for anxiety.  This will not be a long-term medication.  There is risk of addiction and dependence on this kind of medication.  Do not take it within 8 hours of taking her pain medication.  If this medication makes you excessively drowsy please discontinue use of it and let us know.

## 2022-08-28 NOTE — Assessment & Plan Note (Addendum)
Chronic issue that is worsened recently.  Patient has grief as well.  Discussed continuing Effexor 150 mg daily and Wellbutrin 300 mg daily.  We will trial low-dose clonazepam 0.25 mg twice daily as needed for anxiety.  Advised not to drink any alcohol while taking this.  Discussed monitoring for drowsiness with this.  Advised not to take this within 8 hours of her pain medication.  Discussed this would not be a long-term medication.  Advised of the risk of addiction and dependence on this kind of medication.

## 2022-08-28 NOTE — Progress Notes (Signed)
Tommi Rumps, MD Phone: [phone]  Ashley [name] is a 82 y.o. female who presents today for f/u.  HYPERTENSION Disease Monitoring Home BP Monitoring not checking Chest pain- no    Dyspnea- no Medications Compliance-  taking HCTZ.  Edema- no BMET    Component Value Date/Time   NA 136 02/20/2022 1448   K 3.7 02/20/2022 1448   CL 102 02/20/2022 1448   CO2 23 02/20/2022 1448   GLUCOSE 103 (H) 02/20/2022 1448   BUN 16 02/20/2022 1448   CREATININE 0.97 02/20/2022 1448   CREATININE 0.69 03/11/2018 1648   CALCIUM 9.4 02/20/2022 1448   GFRNONAA 60 (L) 02/20/2022 1448   GFRNONAA 86 03/11/2018 1648   GFRAA >60 05/12/2019 1008   GFRAA 99 03/11/2018 1648   Chronic back pain: Patient has chronic back pain radiating to right leg.  She notes no numbness or weakness.  She sees physical medicine and rehab and they told her she had a pinched nerve.  They started on gabapentin and increase to 300 mg nightly which made her sleepy the next day.  She also takes hydrocodone through them.  Anxiety/depression: Patient's husband passed away recently after a long hospitalization related to a brain bleed.  She has been on Effexor and Wellbutrin.  She is going to start doing hospice counseling.  She wonders if there is anything she can take to help with her grief.  Social History   Tobacco Use  Smoking Status Former   Packs/day: 0.50   Years: 15.00   Total pack years: 7.50   Types: Cigarettes   Quit date: 08/07/1976   Years since quitting: 46.0  Smokeless Tobacco Never    Current Outpatient Medications on File Prior to Visit  Medication Sig Dispense Refill   acetaminophen (TYLENOL) 650 MG CR tablet Take 1,300 mg by mouth every 8 (eight) hours as needed for pain.     aspirin EC 81 MG tablet Take 81 mg by mouth daily. Swallow whole.     azelastine (OPTIVAR) 0.05 % ophthalmic solution Apply to eye.     buPROPion (WELLBUTRIN XL) 300 MG 24 hr tablet TAKE ONE TABLET EVERY DAY 90 tablet 1    EPINEPHrine (EPIPEN 2-PAK) 0.3 mg/0.3 mL IJ SOAJ injection Inject 0.3 mLs (0.3 mg total) into the muscle as needed for anaphylaxis. 1 each 1   fluticasone (FLONASE) 50 MCG/ACT nasal spray Place 1 spray into both nostrils daily.     hydrochlorothiazide (HYDRODIURIL) 25 MG tablet TAKE 1 TABLET BY MOUTH DAILY 90 tablet 1   Melatonin 10 MG TABS Take 10 mg by mouth at bedtime.     NASACORT ALLERGY 24HR 55 MCG/ACT AERO nasal inhaler 2 sprays daily.     omeprazole (PRILOSEC) 20 MG capsule TAKE 1 CAPSULE BY MOUTH ONCE DAILY 30 capsule 5   Polyethyl Glycol-Propyl Glycol (SYSTANE) 0.4-0.3 % SOLN Place 1 drop into both eyes 3 (three) times daily as needed (dry/irritated eyes.).     rosuvastatin (CRESTOR) 40 MG tablet TAKE 1 TABLET BY MOUTH ONCE DAILY 90 tablet 1   traMADol (ULTRAM) 50 MG tablet Take 25-50 mg by mouth 2 (two) times daily as needed.     venlafaxine XR (EFFEXOR-XR) 150 MG 24 hr capsule TAKE 1 CAPSULE BY MOUTH EVERY DAY 90 capsule 3   No current facility-administered medications on file prior to visit.     ROS see history of present illness  Objective  Physical Exam Vitals:   08/28/22 1037 08/28/22 1054  BP: (!) 140/80 130/78  Pulse: 94   Temp: 97.6 F (36.4 C)   SpO2: 94%     BP Readings from Last 3 Encounters:  08/28/22 130/78  07/17/22 (!) 137/94  06/28/22 (!) 145/95   Wt Readings from Last 3 Encounters:  08/28/22 140 lb (63.5 kg)  07/17/22 137 lb 6.4 oz (62.3 kg)  06/28/22 137 lb (62.1 kg)    Physical Exam Constitutional:      General: She is not in acute distress.    Appearance: She is not diaphoretic.  Cardiovascular:     Rate and Rhythm: Normal rate and regular rhythm.     Heart sounds: Normal heart sounds.  Pulmonary:     Effort: Pulmonary effort is normal.     Breath sounds: Normal breath sounds.  Skin:    General: Skin is warm and dry.  Neurological:     Mental Status: She is alert.      Assessment/Plan: Please see individual problem  list.  Spinal stenosis of lumbar region with radiculopathy Assessment & Plan: We will have the patient try a lower dose of gabapentin 200 mg nightly and see if she is able to tolerate that.  She will continue to see her physical medicine and rehab specialist.  Orders: -     Gabapentin; Take 2 capsules (200 mg total) by mouth at bedtime.  Dispense: 180 capsule; Refill: 3  Essential hypertension Assessment & Plan: Chronic issue.  Adequate control for age.  She will continue hydrochlorothiazide 25 mg daily.   Anxiety and depression Assessment & Plan: Chronic issue that is worsened recently.  Patient has grief as well.  Discussed continuing Effexor 150 mg daily and Wellbutrin 300 mg daily.  We will trial low-dose clonazepam 0.25 mg twice daily as needed for anxiety.  Advised not to drink any alcohol while taking this.  Discussed monitoring for drowsiness with this.  Advised not to take this within 8 hours of her pain medication.  Discussed this would not be a long-term medication.  Advised of the risk of addiction and dependence on this kind of medication.  Orders: -     clonazePAM; Take 0.5 tablets (0.25 mg total) by mouth 2 (two) times daily as needed for anxiety.  Dispense: 5 tablet; Refill: 0    Return in about 3 months (around 11/27/2022) for Anxiety/depression.   Tommi Rumps, MD [place]

## 2022-08-28 NOTE — Assessment & Plan Note (Signed)
We will have the patient try a lower dose of gabapentin 200 mg nightly and see if she is able to tolerate that.  She will continue to see her physical medicine and rehab specialist.

## 2022-09-04 ENCOUNTER — Telehealth: Payer: Self-pay | Admitting: Family Medicine

## 2022-09-04 NOTE — Telephone Encounter (Signed)
-----  Message from Peggyann Shoals sent at 09/04/2022  3:14 PM EST ----- Regarding: FYI no PT for right now Contact: [phone] FYI She saw Dr.Chasnis today and he said not to do physical therapy for right now. She just wants you to know why she is not attending PT. Do you still want to see her March 25, it was to follow up after PT?

## 2022-09-04 NOTE — Telephone Encounter (Signed)
Have her keep her f/u please.

## 2022-09-04 NOTE — Telephone Encounter (Signed)
Patient called and wanted Dr Caryl Bis to know that the clonazePAM (KLONOPIN) 0.5 MG tablet is making he dizzy. Could he prescribe something else?

## 2022-09-05 ENCOUNTER — Inpatient Hospital Stay: Payer: Medicare HMO | Admitting: Orthopedic Surgery

## 2022-09-05 ENCOUNTER — Telehealth: Payer: Self-pay | Admitting: Family Medicine

## 2022-09-05 MED ORDER — DIAZEPAM 2 MG PO TABS: 2.0000 mg | ORAL_TABLET | Freq: Two times a day (BID) | ORAL | 0 refills | Status: DC | PRN

## 2022-09-05 NOTE — Telephone Encounter (Signed)
I called the patient and informed her that the provider sent in the Valium for her and she is to stop the Klonopin, she picked the medication up and she understood, I informed her if she has any problems with the valium to let us know.  Antonis Lor,cma

## 2022-09-05 NOTE — Telephone Encounter (Signed)
Patient called and wanted Dr Caryl Bis to know that the clonazePAM (KLONOPIN) 0.5 MG tablet is making he dizzy. Could he prescribe something else?    Noted. She should stop the klonopin. I just sent in valium for her to try instead. If she has issues with this she needs to let us know.

## 2022-09-05 NOTE — Telephone Encounter (Signed)
Ashley [name] documented in the wrong encounter. I created a new encounter to take care of the initial message in this message thread.

## 2022-09-13 ENCOUNTER — Other Ambulatory Visit: Payer: Self-pay | Admitting: Family Medicine

## 2022-09-13 ENCOUNTER — Telehealth: Payer: Self-pay

## 2022-09-13 NOTE — Telephone Encounter (Signed)
FYI

## 2022-09-13 NOTE — Telephone Encounter (Signed)
-----   Message from Howard sent at 09/13/2022  1:58 PM EST ----- Dr. Loistine Chance told pt he did not think PT would be good a idea at the moment. Ms. Fuerst has decided not to pursue PT at this time based on this recommendation.  Referred to PT by [facility] Referral [[id number]]

## 2022-09-13 NOTE — Telephone Encounter (Signed)
In review of his note, she was feeling better so she elected to hold on PT for now. This is okay with me.

## 2022-09-14 ENCOUNTER — Other Ambulatory Visit: Payer: Self-pay | Admitting: Family Medicine

## 2022-09-15 NOTE — Telephone Encounter (Signed)
Pt need a refill on diazepam sent to [facility]

## 2022-09-18 ENCOUNTER — Telehealth: Payer: Self-pay

## 2022-09-18 NOTE — Telephone Encounter (Signed)
Patient states Dr. Tommi Rumps gave her some diazepam (VALIUM) 2 MG tablet to help her with her grieving after the loss of her husband.  Patient states she has taken all of those pills and would like to know if Dr. Caryl Bis can prescribe her something else for her grieving that is not as strong.  *Patient states her preferred pharmacy is [facility].

## 2022-09-18 NOTE — Telephone Encounter (Signed)
Requesting: Diazepam 72m Contract: No UDS: No Last Visit: 08/28/2022 Next Visit: 09/14/2022 Last Refill: 09/06/22  Please Advise

## 2022-09-18 NOTE — Addendum Note (Signed)
Addended by: Cordelia Pen on: 09/18/2022 10:06 AM   Modules accepted: Orders

## 2022-09-19 NOTE — Telephone Encounter (Signed)
There are not really any other good acute treatment options for anxiety related to grief for her.  We could potentially increase her Effexor and that may reduce her symptoms though it may take weeks for that increased dose to become effective.

## 2022-09-19 NOTE — Telephone Encounter (Signed)
Can you see what issues she had with the current dose of Valium?  We could try a lower dose of Valium and see if she is able to tolerate that better.

## 2022-09-19 NOTE — Telephone Encounter (Signed)
I called and spoke with the patient and informed her that the provider stated he could increase the Effexor to reduce the symptoms but I informed her that it would take weeks for it to be effective and she stated she was okay with trying that instead of the valium.  Virat Prather,cma

## 2022-09-20 ENCOUNTER — Other Ambulatory Visit: Payer: Self-pay | Admitting: Obstetrics and Gynecology

## 2022-09-20 DIAGNOSIS — Z1231 Encounter for screening mammogram for malignant neoplasm of breast: Secondary | ICD-10-CM

## 2022-09-20 MED ORDER — VENLAFAXINE HCL ER 75 MG PO CP24: 225.0000 mg | ORAL_CAPSULE | Freq: Every day | ORAL | 1 refills | Status: DC

## 2022-09-20 NOTE — Telephone Encounter (Signed)
Noted. New dose of effexor 225 mg daily sent to pharmacy.

## 2022-09-21 ENCOUNTER — Other Ambulatory Visit: Payer: Self-pay | Admitting: Obstetrics and Gynecology

## 2022-09-21 DIAGNOSIS — Z1231 Encounter for screening mammogram for malignant neoplasm of breast: Secondary | ICD-10-CM

## 2022-09-22 ENCOUNTER — Telehealth: Payer: Self-pay | Admitting: Internal Medicine

## 2022-09-22 NOTE — Telephone Encounter (Signed)
Call from [place] Patient calls in and not feeling right Spoke to pharmacist and had concerns about her medications She asks if she can stop the venlafaxine or bupropion  Asked her to relay that immediate stopping of either of them can cause sig side effects It appears her venlafaxine was recently increased from 150 to 225 daily. Recommended that she go back to 167m daily Will send message to Dr SCaryl Bisto follow up on her concerns

## 2022-09-25 LAB — HM MAMMOGRAPHY

## 2022-09-25 NOTE — Telephone Encounter (Signed)
I agree with the patient going back to the 150 mg dose of Effexor.  Please contact her and see how she is doing today with reducing that dose.  Thanks.

## 2022-09-25 NOTE — Telephone Encounter (Signed)
Left message to call the office back.

## 2022-09-26 NOTE — Telephone Encounter (Signed)
Patient states taking the 150 mg dose of Effexor and she is feeling better.

## 2022-09-28 ENCOUNTER — Observation Stay
Admission: EM | Admit: 2022-09-28 | Discharge: 2022-09-29 | Disposition: A | Discharge status: Home Health | Payer: Medicare HMO | Attending: Internal Medicine | Admitting: Internal Medicine

## 2022-09-28 ENCOUNTER — Other Ambulatory Visit: Payer: Self-pay

## 2022-09-28 ENCOUNTER — Emergency Department: Payer: Medicare HMO

## 2022-09-28 DIAGNOSIS — F32A Depression, unspecified: Secondary | ICD-10-CM

## 2022-09-28 DIAGNOSIS — Z96652 Presence of left artificial knee joint: Secondary | ICD-10-CM | POA: Insufficient documentation

## 2022-09-28 DIAGNOSIS — Z7982 Long term (current) use of aspirin: Secondary | ICD-10-CM | POA: Diagnosis not present

## 2022-09-28 DIAGNOSIS — Z87891 Personal history of nicotine dependence: Secondary | ICD-10-CM | POA: Diagnosis not present

## 2022-09-28 DIAGNOSIS — N390 Urinary tract infection, site not specified: Secondary | ICD-10-CM | POA: Diagnosis present

## 2022-09-28 DIAGNOSIS — E663 Overweight: Secondary | ICD-10-CM | POA: Diagnosis not present

## 2022-09-28 DIAGNOSIS — F419 Anxiety disorder, unspecified: Secondary | ICD-10-CM

## 2022-09-28 DIAGNOSIS — I10 Essential (primary) hypertension: Secondary | ICD-10-CM | POA: Diagnosis not present

## 2022-09-28 DIAGNOSIS — Y92009 Unspecified place in unspecified non-institutional (private) residence as the place of occurrence of the external cause: Secondary | ICD-10-CM | POA: Insufficient documentation

## 2022-09-28 DIAGNOSIS — N3001 Acute cystitis with hematuria: Secondary | ICD-10-CM

## 2022-09-28 DIAGNOSIS — W01198A Fall on same level from slipping, tripping and stumbling with subsequent striking against other object, initial encounter: Secondary | ICD-10-CM | POA: Diagnosis not present

## 2022-09-28 DIAGNOSIS — Z8673 Personal history of transient ischemic attack (TIA), and cerebral infarction without residual deficits: Secondary | ICD-10-CM | POA: Insufficient documentation

## 2022-09-28 DIAGNOSIS — Z1152 Encounter for screening for COVID-19: Secondary | ICD-10-CM | POA: Diagnosis not present

## 2022-09-28 DIAGNOSIS — F39 Unspecified mood [affective] disorder: Secondary | ICD-10-CM | POA: Diagnosis present

## 2022-09-28 DIAGNOSIS — E785 Hyperlipidemia, unspecified: Secondary | ICD-10-CM

## 2022-09-28 DIAGNOSIS — R531 Weakness: Secondary | ICD-10-CM | POA: Diagnosis present

## 2022-09-28 DIAGNOSIS — Z6829 Body mass index (BMI) 29.0-29.9, adult: Secondary | ICD-10-CM | POA: Diagnosis not present

## 2022-09-28 DIAGNOSIS — K219 Gastro-esophageal reflux disease without esophagitis: Secondary | ICD-10-CM | POA: Diagnosis present

## 2022-09-28 DIAGNOSIS — Z7952 Long term (current) use of systemic steroids: Secondary | ICD-10-CM | POA: Diagnosis not present

## 2022-09-28 LAB — COMPREHENSIVE METABOLIC PANEL
ALT: 21 U/L (ref 0–44)
AST: 33 U/L (ref 15–41)
Albumin: 4.3 g/dL (ref 3.5–5.0)
Alkaline Phosphatase: 49 U/L (ref 38–126)
Anion gap: 15 (ref 5–15)
BUN: 12 mg/dL (ref 8–23)
CO2: 23 mmol/L (ref 22–32)
Calcium: 9.5 mg/dL (ref 8.9–10.3)
Chloride: 97 mmol/L — ABNORMAL LOW (ref 98–111)
Creatinine, Ser: 0.93 mg/dL (ref 0.44–1.00)
GFR, Estimated: >60 mL/min (ref >60)
Glucose, Bld: 97 mg/dL (ref 70–99)
Potassium: 3.7 mmol/L (ref 3.5–5.1)
Sodium: 135 mmol/L (ref 135–145)
Total Bilirubin: 0.9 mg/dL (ref 0.3–1.2)
Total Protein: 7 g/dL (ref 6.5–8.1)

## 2022-09-28 LAB — CBC WITH DIFFERENTIAL/PLATELET
Abs Immature Granulocytes: 0.07 10*3/uL (ref 0.00–0.07)
Basophils Absolute: 0.1 10*3/uL (ref 0.0–0.1)
Basophils Relative: 1 %
Eosinophils Absolute: 0.3 10*3/uL (ref 0.0–0.5)
Eosinophils Relative: 2 %
HCT: 43.8 % (ref 36.0–46.0)
Hemoglobin: 14 g/dL (ref 12.0–15.0)
Immature Granulocytes: 1 %
Lymphocytes Relative: 15 %
Lymphs Abs: 2 10*3/uL (ref 0.7–4.0)
MCH: 31 pg (ref 26.0–34.0)
MCHC: 32 g/dL (ref 30.0–36.0)
MCV: 97.1 fL (ref 80.0–100.0)
Monocytes Absolute: 0.7 10*3/uL (ref 0.1–1.0)
Monocytes Relative: 6 %
Neutro Abs: 9.7 10*3/uL — ABNORMAL HIGH (ref 1.7–7.7)
Neutrophils Relative %: 75 %
Platelets: 288 10*3/uL (ref 150–400)
RBC: 4.51 MIL/uL (ref 3.87–5.11)
RDW: 13.6 % (ref 11.5–15.5)
WBC: 12.8 10*3/uL — ABNORMAL HIGH (ref 4.0–10.5)
nRBC: 0 % (ref 0.0–0.2)

## 2022-09-28 LAB — URINALYSIS, ROUTINE W REFLEX MICROSCOPIC
Bilirubin Urine: NEGATIVE
Glucose, UA: NEGATIVE mg/dL
Ketones, ur: NEGATIVE mg/dL
Nitrite: POSITIVE — AB
Protein, ur: NEGATIVE mg/dL
Specific Gravity, Urine: 1.009 (ref 1.005–1.030)
pH: 6 (ref 5.0–8.0)

## 2022-09-28 LAB — RESP PANEL BY RT-PCR (RSV, FLU A&B, COVID)  RVPGX2
Influenza A by PCR: NEGATIVE
Influenza B by PCR: NEGATIVE
Resp Syncytial Virus by PCR: NEGATIVE
SARS Coronavirus 2 by RT PCR: NEGATIVE

## 2022-09-28 MED ORDER — LACTATED RINGERS IV SOLN: INTRAVENOUS | Status: DC

## 2022-09-28 MED ORDER — SODIUM CHLORIDE 0.9 % IV SOLN: Freq: Once | INTRAVENOUS | Status: DC

## 2022-09-28 MED ORDER — SODIUM CHLORIDE 0.9 % IV SOLN: Freq: Once | INTRAVENOUS | Status: AC

## 2022-09-28 MED ORDER — SODIUM CHLORIDE 0.9 % IV SOLN
1.0000 g | Freq: Once | INTRAVENOUS | Status: AC
  Administered 2022-09-28: 1 g via INTRAVENOUS
  Filled 2022-09-28: qty 10

## 2022-09-28 NOTE — ED Triage Notes (Signed)
BIB [facility] from home. Reports generalized weakness all day today. Reports 1 fall at home today but EMS suspects more. Pt has no c/o pain. Denies hitting head or LOC. Reports to EMS that she has bouts of falls. Pt does not utilize walker or cane as prescribed. Neighbors reported to EMS that pt at neuro baseline.   20G R hand placed by EMS 160/90 HR 100 Spo2 95 CBG 120  Pt alert and oriented on arrival following commands. Breathing unlabored speaking in full sentences with symmetric chest rise and fall. Pt endorses multiple mechanical falls in the last 2 weeks. Pt continues to endorse generalized weakness. Pt unsure if she hit her head during fall but denies LOC. States likely bumped her head on wall as she was crawling down the hallway. Pt denies using walker.

## 2022-09-28 NOTE — H&P (Incomplete)
History and Physical    Patient: Ashley [name] [id number] DOB: [birth year] DOA: 09/28/2022 DOS: the patient was seen and examined on 09/28/2022 PCP: Leone Haven, MD  Patient coming from: Home  Chief Complaint:  Chief Complaint  Patient presents with  . Fall  . Weakness   HPI: [name] Ashley is a 82 y.o. female with medical history significant of essential hypertension, hyperlipidemia, GERD, history of CVA, with recurrent falls who sustained episode of fall and weakness at home.  Patient came to the ER where she was seen and evaluated.  She appears to have UTI as well as early sepsis.  She has had the weakness secondary to the UTI.  Patient has no altered mental status at the moment but appears chronically ill.  She is being admitted for further evaluation and treatment.  Review of Systems: As mentioned in the history of present illness. All other systems reviewed and are negative. Past Medical History:  Diagnosis Date  . Abnormal uterine bleeding (AUB)    a. benign endometrial polyps on D+C in 03/2017  . Anxiety   . Arthritis   . Carpal tunnel syndrome of left wrist   . 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 Chicken pox   . Depression   . GERD (gastroesophageal reflux disease)   . History of stress test    a. 08/2017 Lexiscan MV: EF 55-65%. No ischemia/infarct. Low risk.  Marland Kitchen Hyperlipidemia   . Hypertension   . 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 Dx in setting of dizziness/vertigo; b. 02/2018 Carotid U/S: bilateral intimal thickening and atherosclerotic plaque. No hemodynamically significant stenoses; c. 03/2018 MRI Brain: No mass or abnl enhancement. Chronic microvascular ischemic changes and mod volume loss of the brain. 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  Marland Kitchen Spinal stenosis of lumbar region with radiculopathy 2019  . Stroke ([place])    PT STATES SHE WAS TOLD THIS BY HER PCP BUT PT STATES SHE NEVER KNEW SHE HAD A STROKE-NO RESIDUAL EFFECTS  . Vertigo    Past  Surgical History:  Procedure Laterality Date  . BACK SURGERY  11/19/2017   lumbar laminectomy  . CARPAL TUNNEL RELEASE Right 07/30/2019  . CARPAL TUNNEL RELEASE Left 02/21/2021   Procedure: CARPAL TUNNEL RELEASE;  Surgeon: Dereck Leep, MD;  Location: [facility] ORS;  Service: Orthopedics;  Laterality: Left;  . COLONOSCOPY     2003, 2011, 2021  . COLONOSCOPY WITH PROPOFOL N/A 12/24/2019   Procedure: COLONOSCOPY WITH PROPOFOL;  Surgeon: Toledo, [name], MD;  Location: [facility] END[facility];  Service: Gastroenterology;  Laterality: N/A;  . CYSTOSCOPY MACROPLASTIQUE IMPLANT N/A 05/15/2019   Procedure: CYSTOSCOPY MACROPLASTIQUE IMPLANT;  Surgeon: Royston Cowper, MD;  Location: [facility];  Service: Urology;  Laterality: N/A;  . DILATATION & CURETTAGE/HYSTEROSCOPY WITH MYOSURE N/A 03/30/2017   Procedure: DILATATION & CURETTAGE/HYSTEROSCOPY WITH MYOSURE;  Surgeon: Schermerhorn, Gwen Her, MD;  Location: [facility];  Service: Gynecology;  Laterality: N/A;  . EYE SURGERY Bilateral    Cataract Extraction with IOL  . JOINT REPLACEMENT    . KNEE ARTHROPLASTY Left 12/01/2020   Procedure: COMPUTER ASSISTED TOTAL KNEE ARTHROPLASTY;  Surgeon: Dereck Leep, MD;  Location: [facility];  Service: Orthopedics;  Laterality: Left;  . SKIN LESION EXCISION Right    synovial cyst right AC joint  . TONSILLECTOMY AND ADENOIDECTOMY     Age 37   Social History:  reports that she quit smoking about 46 years ago. Her smoking use included cigarettes. She  has a 7.50 pack-year smoking history. She has never used smokeless tobacco. She reports current alcohol use of about 1.0 standard drink of alcohol per week. She reports that she does not use drugs.  Allergies  Allergen Reactions  . Ace Inhibitors Other (See Comments)    Angioedema  . Pravastatin Other (See Comments)    Other reaction(s): Muscle Pain  . Simvastatin Other (See Comments)    stiffness    Family History  Problem Relation Age of Onset  . Rheum arthritis  Mother   . Ovarian cancer Sister   . COPD Sister   . CAD Sister   . Heart disease Father   . Aneurysm Father   . Cancer Brother   . Thrombosis Maternal Grandmother        a. DVT  . Thrombosis Maternal Grandfather        a. DVT  . Thrombosis Paternal Grandmother        a. DVT  . Thrombosis Paternal Grandfather        a. DVT    Prior to Admission medications   Medication Sig Start Date End Date Taking? Authorizing Provider  acetaminophen (TYLENOL) 650 MG CR tablet Take 1,300 mg by mouth every 8 (eight) hours as needed for pain.    [provider]  aspirin EC 81 MG tablet Take 81 mg by mouth daily. Swallow whole.    [provider]  azelastine (OPTIVAR) 0.05 % ophthalmic solution Apply to eye. 12/25/19   [provider]  buPROPion (WELLBUTRIN XL) 300 MG 24 hr tablet TAKE ONE TABLET EVERY DAY 08/15/22   Leone Haven, MD  diazepam (VALIUM) 2 MG tablet Take 1 tablet (2 mg total) by mouth every 12 (twelve) hours as needed for anxiety. 09/05/22   Leone Haven, MD  EPINEPHrine (EPIPEN 2-PAK) 0.3 mg/0.3 mL IJ SOAJ injection Inject 0.3 mLs (0.3 mg total) into the muscle as needed for anaphylaxis. 02/20/19   Harvest Dark, MD  fluticasone (FLONASE) 50 MCG/ACT nasal spray Place 1 spray into both nostrils daily. 02/21/21   Hooten, Laurice Record, MD  gabapentin (NEURONTIN) 100 MG capsule Take 2 capsules (200 mg total) by mouth at bedtime. 08/28/22   Leone Haven, MD  hydrochlorothiazide (HYDRODIURIL) 25 MG tablet TAKE 1 TABLET BY MOUTH DAILY 09/14/22   Leone Haven, MD  Melatonin 10 MG TABS Take 10 mg by mouth at bedtime.    [provider]  NASACORT ALLERGY 24HR 55 MCG/ACT AERO nasal inhaler 2 sprays daily. 01/23/22   [provider]  omeprazole (PRILOSEC) 20 MG capsule TAKE 1 CAPSULE BY MOUTH ONCE DAILY 03/08/22   Leone Haven, MD  Polyethyl Glycol-Propyl Glycol (SYSTANE) 0.4-0.3 % SOLN Place 1 drop into both eyes 3 (three) times daily  as needed (dry/irritated eyes.).    [provider]  rosuvastatin (CRESTOR) 40 MG tablet TAKE 1 TABLET BY MOUTH ONCE DAILY 07/31/22   Dutch Quint B, FNP  traMADol (ULTRAM) 50 MG tablet Take 25-50 mg by mouth 2 (two) times daily as needed. 11/24/21   [provider]  venlafaxine XR (EFFEXOR-XR) 75 MG 24 hr capsule Take 3 capsules (225 mg total) by mouth daily. 09/20/22   Leone Haven, MD    Physical Exam: Vitals:   09/28/22 2037 09/28/22 2038 09/28/22 2100 09/28/22 2130  BP: (!) 142/78  124/70 139/76  Pulse: 100  89 90  Resp: 17  17 15  $ Temp: 98.3 F (36.8 C)     TempSrc:  Oral     SpO2: 98%  95% 98%  Weight:  63.5 kg    Height:  4' 11"$  (1.499 m)     Constitutional: Frail, acutely ill looking, no distress NAD, calm, comfortable Eyes: PERRL, lids and conjunctivae normal ENMT: Mucous membranes are dry. Posterior pharynx clear of any exudate or lesions.Normal dentition.  Neck: normal, supple, no masses, no thyromegaly Respiratory: clear to auscultation bilaterally, no wheezing, no crackles. Normal respiratory effort. No accessory muscle use.  Cardiovascular: Regular rate and rhythm, no murmurs / rubs / gallops. No extremity edema. 2+ pedal pulses. No carotid bruits.  Abdomen: no tenderness, no masses palpated. No hepatosplenomegaly. Bowel sounds positive.  Musculoskeletal: Good range of motion, no joint swelling or tenderness, Skin: no rashes, lesions, ulcers. No induration Neurologic: CN 2-12 grossly intact. Sensation intact, DTR normal. Strength 5/5 in all 4.  Psychiatric: Normal judgment and insight. Alert and oriented x 3. Normal mood  Data Reviewed:  {Results:26384}  Assessment and Plan: No notes have been filed under this hospital service. Service: Hospitalist     Advance Care Planning:   Code Status: Prior ***  Consults: ***  Family Communication: ***  Severity of Illness: {Observation/Inpatient:21159}  AuthorBarbette Merino, MD 09/28/2022  11:27 PM  For on call review [email or web address].

## 2022-09-28 NOTE — ED Provider Notes (Signed)
[facility] Provider Note    Event Date/Time   First MD Initiated Contact with Patient 09/28/22 2134     (approximate)   History   Fall and Weakness   HPI  Ashley [name] is a 82 y.o. female who presents to the emergency department today because of concerns for a fall.  Patient states that she does occasionally have increased frequency of falls.  She did hit her head.  Not complaining of any significant pain. Denies any other injury. She does state however that she gets occasional weakness and feels like her legs give out on both sides.      Physical Exam   Triage Vital Signs: ED Triage Vitals  Enc Vitals Group     BP 09/28/22 2037 (!) 142/78     Pulse Rate 09/28/22 2037 100     Resp 09/28/22 2037 17     Temp 09/28/22 2037 98.3 F (36.8 C)     Temp Source 09/28/22 2037 Oral     SpO2 09/28/22 2037 98 %     Weight 09/28/22 2038 140 lb (63.5 kg)     Height 09/28/22 2038 4' 11"$  (1.499 m)     Head Circumference --      Peak Flow --      Pain Score 09/28/22 2037 0     Pain Loc --      Pain Edu? --      Excl. in [place]? --     Most recent vital signs: Vitals:   09/28/22 2037  BP: (!) 142/78  Pulse: 100  Resp: 17  Temp: 98.3 F (36.8 C)  SpO2: 98%   General: Awake, alert, oriented. CV:  Good peripheral perfusion. Regular rate and rhythm. Resp:  Normal effort. Lungs clear. Abd:  No distention. Non tender.    ED Results / Procedures / Treatments   Labs (all labs ordered are listed, but only abnormal results are displayed) Labs Reviewed  CBC WITH DIFFERENTIAL/PLATELET - Abnormal; Notable for the following components:      Result Value   WBC 12.8 (*)    Neutro Abs 9.7 (*)    All other components within normal limits  COMPREHENSIVE METABOLIC PANEL - Abnormal; Notable for the following components:   Chloride 97 (*)    All other components within normal limits  URINALYSIS, ROUTINE W REFLEX MICROSCOPIC - Abnormal; Notable for the  following components:   Color, Urine YELLOW (*)    APPearance HAZY (*)    Hgb urine dipstick SMALL (*)    Nitrite POSITIVE (*)    Leukocytes,Ua TRACE (*)    Bacteria, UA FEW (*)    All other components within normal limits  RESP PANEL BY RT-PCR (RSV, FLU A&B, COVID)  RVPGX2  URINE CULTURE     EKG  I, Nance Pear, attending physician, personally viewed and interpreted this EKG  EKG Time: 2035 Rate: 105 Rhythm: sinus tachycardia Axis: normal Intervals: qtc 491 QRS: low voltage precordia leads ST changes: no st elevation Impression: abnormal ekg   RADIOLOGY I independently interpreted and visualized the CT head. My interpretation: No bleed Radiology interpretation:  IMPRESSION:  1. No acute intracranial abnormality.  2. Generalized atrophy and findings of chronic microvascular  disease.      PROCEDURES:  Critical Care performed: No  Procedures   MEDICATIONS ORDERED IN ED: Medications - No data to display   IMPRESSION / MDM / [place] / ED COURSE  I reviewed the triage vital  signs and the nursing notes.                              Differential diagnosis includes, but is not limited to, anemia, electrolyte abnormality, infection.  Patient's presentation is most consistent with acute presentation with potential threat to life or bodily function.   Patient presented to the emergency department today because of concerns for weakness and falls.  On exam patient is awake and alert.  Lungs clear.  Abdomen is soft and benign.  Blood work does show a slight leukocytosis.  Urine is concerning for infection.  At this time I do have concerns patient is suffering from urinary tract infection.  This can certainly cause weakness and falls.  Will plan on starting IV antibiotics.  Discussed with Dr. Jonelle Sidle with the hospitalist service who will plan on admission.   FINAL CLINICAL IMPRESSION(S) / ED DIAGNOSES   Final diagnoses:  Lower urinary tract infectious  disease    Note:  This document was prepared using Dragon voice recognition software and may include unintentional dictation errors.    Nance Pear, MD 09/28/22 [id number]

## 2022-09-28 NOTE — H&P (Signed)
History and Physical    Patient: Ashley [name] [id number] DOB: 12-01-1940 DOA: 09/28/2022 DOS: the patient was seen and examined on 09/28/2022 PCP: Leone Haven, MD  Patient coming from: Home  Chief Complaint:  Chief Complaint  Patient presents with   Fall   Weakness   HPI: Ashley [name] is a 82 y.o. female with medical history significant of essential hypertension, hyperlipidemia, GERD, history of CVA, with recurrent falls who sustained episode of fall and weakness at home.  Patient came to the ER where she was seen and evaluated.  She appears to have UTI as well as early sepsis.  She has had the weakness secondary to the UTI.  Patient has no altered mental status at the moment but appears chronically ill.  She is being admitted for further evaluation and treatment.  Review of Systems: As mentioned in the history of present illness. All other systems reviewed and are negative. Past Medical History:  Diagnosis Date   Abnormal uterine bleeding (AUB)    a. benign endometrial polyps on D+C in 03/2017   Anxiety    Arthritis    Carpal tunnel syndrome of left wrist    Cerebral hemorrhage ([facility]) 2023   Chicken pox    Depression    GERD (gastroesophageal reflux disease)    History of stress test    a. 08/2017 Lexiscan MV: EF 55-65%. No ischemia/infarct. Low risk.   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 Dx in setting of dizziness/vertigo; b. 02/2018 Carotid U/S: bilateral intimal thickening and atherosclerotic plaque. No hemodynamically significant stenoses; c. 03/2018 MRI Brain: No mass or abnl enhancement. Chronic microvascular ischemic changes and mod volume loss of the brain. 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   Spinal stenosis of lumbar region with radiculopathy 2019   Stroke ([place])    PT STATES SHE WAS TOLD THIS BY HER PCP BUT PT STATES SHE NEVER KNEW SHE HAD A STROKE-NO RESIDUAL EFFECTS   Vertigo    Past Surgical  History:  Procedure Laterality Date   BACK SURGERY  11/19/2017   lumbar laminectomy   CARPAL TUNNEL RELEASE Right 07/30/2019   CARPAL TUNNEL RELEASE Left 02/21/2021   Procedure: CARPAL TUNNEL RELEASE;  Surgeon: Dereck Leep, MD;  Location: [facility];  Service: Orthopedics;  Laterality: Left;   COLONOSCOPY     2003, 2011, 2021   COLONOSCOPY WITH PROPOFOL N/A 12/24/2019   Procedure: COLONOSCOPY WITH PROPOFOL;  Surgeon: Toledo, [name], MD;  Location: [facility];  Service: Gastroenterology;  Laterality: N/A;   CYSTOSCOPY MACROPLASTIQUE IMPLANT N/A 05/15/2019   Procedure: CYSTOSCOPY MACROPLASTIQUE IMPLANT;  Surgeon: Royston Cowper, MD;  Location: [facility];  Service: Urology;  Laterality: N/A;   DILATATION & CURETTAGE/HYSTEROSCOPY WITH MYOSURE N/A 03/30/2017   Procedure: DILATATION & CURETTAGE/HYSTEROSCOPY WITH MYOSURE;  Surgeon: Schermerhorn, Gwen Her, MD;  Location: [facility];  Service: Gynecology;  Laterality: N/A;   EYE SURGERY Bilateral    Cataract Extraction with IOL   JOINT REPLACEMENT     KNEE ARTHROPLASTY Left 12/01/2020   Procedure: COMPUTER ASSISTED TOTAL KNEE ARTHROPLASTY;  Surgeon: Dereck Leep, MD;  Location: [facility];  Service: Orthopedics;  Laterality: Left;   SKIN LESION EXCISION Right    synovial cyst right AC joint   TONSILLECTOMY AND ADENOIDECTOMY     Age 42   Social History:  reports that she quit smoking about 46 years ago. Her smoking use included cigarettes. She  has a 7.50 pack-year smoking history. She has never used smokeless tobacco. She reports current alcohol use of about 1.0 standard drink of alcohol per week. She reports that she does not use drugs.  Allergies  Allergen Reactions   Ace Inhibitors Other (See Comments)    Angioedema   Pravastatin Other (See Comments)    Other reaction(s): Muscle Pain   Simvastatin Other (See Comments)    stiffness    Family History  Problem Relation Age of Onset   Rheum arthritis Mother    Ovarian cancer  Sister    COPD Sister    CAD Sister    Heart disease Father    Aneurysm Father    Cancer Brother    Thrombosis Maternal Grandmother        a. DVT   Thrombosis Maternal Grandfather        a. DVT   Thrombosis Paternal Grandmother        a. DVT   Thrombosis Paternal Grandfather        a. DVT    Prior to Admission medications   Medication Sig Start Date End Date Taking? Authorizing Provider  acetaminophen (TYLENOL) 650 MG CR tablet Take 1,300 mg by mouth every 8 (eight) hours as needed for pain.    [provider]  aspirin EC 81 MG tablet Take 81 mg by mouth daily. Swallow whole.    [provider]  azelastine (OPTIVAR) 0.05 % ophthalmic solution Apply to eye. 12/25/19   [provider]  buPROPion (WELLBUTRIN XL) 300 MG 24 hr tablet TAKE ONE TABLET EVERY DAY 08/15/22   Leone Haven, MD  diazepam (VALIUM) 2 MG tablet Take 1 tablet (2 mg total) by mouth every 12 (twelve) hours as needed for anxiety. 09/05/22   Leone Haven, MD  EPINEPHrine (EPIPEN 2-PAK) 0.3 mg/0.3 mL IJ SOAJ injection Inject 0.3 mLs (0.3 mg total) into the muscle as needed for anaphylaxis. 02/20/19   Harvest Dark, MD  fluticasone (FLONASE) 50 MCG/ACT nasal spray Place 1 spray into both nostrils daily. 02/21/21   Hooten, Laurice Record, MD  gabapentin (NEURONTIN) 100 MG capsule Take 2 capsules (200 mg total) by mouth at bedtime. 08/28/22   Leone Haven, MD  hydrochlorothiazide (HYDRODIURIL) 25 MG tablet TAKE 1 TABLET BY MOUTH DAILY 09/14/22   Leone Haven, MD  Melatonin 10 MG TABS Take 10 mg by mouth at bedtime.    [provider]  NASACORT ALLERGY 24HR 55 MCG/ACT AERO nasal inhaler 2 sprays daily. 01/23/22   [provider]  omeprazole (PRILOSEC) 20 MG capsule TAKE 1 CAPSULE BY MOUTH ONCE DAILY 03/08/22   Leone Haven, MD  Polyethyl Glycol-Propyl Glycol (SYSTANE) 0.4-0.3 % SOLN Place 1 drop into both eyes 3 (three) times daily as needed (dry/irritated eyes.).     [provider]  rosuvastatin (CRESTOR) 40 MG tablet TAKE 1 TABLET BY MOUTH ONCE DAILY 07/31/22   Dutch Quint B, FNP  traMADol (ULTRAM) 50 MG tablet Take 25-50 mg by mouth 2 (two) times daily as needed. 11/24/21   [provider]  venlafaxine XR (EFFEXOR-XR) 75 MG 24 hr capsule Take 3 capsules (225 mg total) by mouth daily. 09/20/22   Leone Haven, MD    Physical Exam: Vitals:   09/28/22 2037 09/28/22 2038 09/28/22 2100 09/28/22 2130  BP: (!) 142/78  124/70 139/76  Pulse: 100  89 90  Resp: '17  17 15  '$ Temp: 98.3 F (36.8 C)     TempSrc:  Oral     SpO2: 98%  95% 98%  Weight:  63.5 kg    Height:  '4\' 11"'$  (1.499 m)     Constitutional: Frail, acutely ill looking, no distress NAD, calm, comfortable Eyes: PERRL, lids and conjunctivae normal ENMT: Mucous membranes are dry. Posterior pharynx clear of any exudate or lesions.Normal dentition.  Neck: normal, supple, no masses, no thyromegaly Respiratory: clear to auscultation bilaterally, no wheezing, no crackles. Normal respiratory effort. No accessory muscle use.  Cardiovascular: Regular rate and rhythm, no murmurs / rubs / gallops. No extremity edema. 2+ pedal pulses. No carotid bruits.  Abdomen: no tenderness, no masses palpated. No hepatosplenomegaly. Bowel sounds positive.  Musculoskeletal: Good range of motion, no joint swelling or tenderness, Skin: no rashes, lesions, ulcers. No induration Neurologic: CN 2-12 grossly intact. Sensation intact, DTR normal. Strength 5/5 in all 4.  Psychiatric: Normal judgment and insight. Alert and oriented x 3. Normal mood  Data Reviewed:  White count is 12.8, urinalysis showed leukocyte esterase positive nitrite positive and few bacteria head CT without contrast negative.  Assessment and Plan:  #1 generalized weakness and falls: Most likely secondary to UTI.  Patient will be admitted.  Initiate urine and blood cultures.  IV Rocephin.  PT and OT consultation.  #2 essential  hypertension: Confirm on continue home regimen.  #3 GERD: Continue PPIs  #4 hyperlipidemia: Continue statin.  #5 anxiety with depression: Will resume home regimen       Advance Care Planning:   Code Status: Prior full code  Consults: PT and OT  Family Communication: Husband  Severity of Illness: The appropriate patient status for this patient is INPATIENT. Inpatient status is judged to be reasonable and necessary in order to provide the required intensity of service to ensure the patient's safety. The patient's presenting symptoms, physical exam findings, and initial radiographic and laboratory data in the context of their chronic comorbidities is felt to place them at high risk for further clinical deterioration. Furthermore, it is not anticipated that the patient will be medically stable for discharge from the hospital within 2 midnights of admission.   * I certify that at the point of admission it is my clinical judgment that the patient will require inpatient hospital care spanning beyond 2 midnights from the point of admission due to high intensity of service, high risk for further deterioration and high frequency of surveillance required.*  AuthorBarbette Merino, MD 09/28/2022 11:27 PM  For on call review [email or web address].

## 2022-09-29 ENCOUNTER — Telehealth: Payer: Self-pay | Admitting: Family Medicine

## 2022-09-29 DIAGNOSIS — I10 Essential (primary) hypertension: Secondary | ICD-10-CM | POA: Diagnosis not present

## 2022-09-29 DIAGNOSIS — Y92009 Unspecified place in unspecified non-institutional (private) residence as the place of occurrence of the external cause: Secondary | ICD-10-CM | POA: Diagnosis not present

## 2022-09-29 DIAGNOSIS — N3001 Acute cystitis with hematuria: Secondary | ICD-10-CM | POA: Diagnosis not present

## 2022-09-29 DIAGNOSIS — W19XXXA Unspecified fall, initial encounter: Secondary | ICD-10-CM | POA: Diagnosis not present

## 2022-09-29 DIAGNOSIS — E663 Overweight: Secondary | ICD-10-CM | POA: Insufficient documentation

## 2022-09-29 LAB — CBC
HCT: 39.1 % (ref 36.0–46.0)
Hemoglobin: 12.7 g/dL (ref 12.0–15.0)
MCH: 31.3 pg (ref 26.0–34.0)
MCHC: 32.5 g/dL (ref 30.0–36.0)
MCV: 96.3 fL (ref 80.0–100.0)
Platelets: 264 10*3/uL (ref 150–400)
RBC: 4.06 MIL/uL (ref 3.87–5.11)
RDW: 13.7 % (ref 11.5–15.5)
WBC: 11.3 10*3/uL — ABNORMAL HIGH (ref 4.0–10.5)
nRBC: 0 % (ref 0.0–0.2)

## 2022-09-29 LAB — COMPREHENSIVE METABOLIC PANEL
ALT: 19 U/L (ref 0–44)
AST: 41 U/L (ref 15–41)
Albumin: 3.7 g/dL (ref 3.5–5.0)
Alkaline Phosphatase: 39 U/L (ref 38–126)
Anion gap: 8 (ref 5–15)
BUN: 11 mg/dL (ref 8–23)
CO2: 26 mmol/L (ref 22–32)
Calcium: 8.7 mg/dL — ABNORMAL LOW (ref 8.9–10.3)
Chloride: 104 mmol/L (ref 98–111)
Creatinine, Ser: 0.84 mg/dL (ref 0.44–1.00)
GFR, Estimated: >60 mL/min (ref >60)
Glucose, Bld: 116 mg/dL — ABNORMAL HIGH (ref 70–99)
Potassium: 3.5 mmol/L (ref 3.5–5.1)
Sodium: 138 mmol/L (ref 135–145)
Total Bilirubin: 0.5 mg/dL (ref 0.3–1.2)
Total Protein: 6 g/dL — ABNORMAL LOW (ref 6.5–8.1)

## 2022-09-29 MED ORDER — ENOXAPARIN SODIUM 40 MG/0.4ML IJ SOSY
40.0000 mg | PREFILLED_SYRINGE | INTRAMUSCULAR | Status: DC
  Administered 2022-09-29: 40 mg via SUBCUTANEOUS
  Filled 2022-09-29: qty 0.4

## 2022-09-29 MED ORDER — BUPROPION HCL ER (XL) 150 MG PO TB24: 150.0000 mg | ORAL_TABLET | Freq: Every day | ORAL | Status: DC

## 2022-09-29 MED ORDER — ONDANSETRON HCL 4 MG PO TABS: 4.0000 mg | ORAL_TABLET | Freq: Four times a day (QID) | ORAL | Status: DC | PRN

## 2022-09-29 MED ORDER — ONDANSETRON HCL 4 MG/2ML IJ SOLN: 4.0000 mg | Freq: Four times a day (QID) | INTRAMUSCULAR | Status: DC | PRN

## 2022-09-29 MED ORDER — FLUTICASONE PROPIONATE 50 MCG/ACT NA SUSP
1.0000 | Freq: Every day | NASAL | Status: DC
  Administered 2022-09-29: 1 via NASAL
  Filled 2022-09-29: qty 16

## 2022-09-29 MED ORDER — ASPIRIN 81 MG PO TBEC
81.0000 mg | DELAYED_RELEASE_TABLET | Freq: Every day | ORAL | Status: DC
  Administered 2022-09-29: 81 mg via ORAL
  Filled 2022-09-29: qty 1

## 2022-09-29 MED ORDER — SODIUM CHLORIDE 0.9 % IV SOLN
1.0000 g | INTRAVENOUS | Status: DC
  Filled 2022-09-29: qty 10

## 2022-09-29 MED ORDER — ROSUVASTATIN CALCIUM 20 MG PO TABS
40.0000 mg | ORAL_TABLET | Freq: Every day | ORAL | Status: DC
  Administered 2022-09-29: 40 mg via ORAL
  Filled 2022-09-29: qty 2

## 2022-09-29 MED ORDER — DEXTROSE-NACL 5-0.9 % IV SOLN: INTRAVENOUS | Status: DC

## 2022-09-29 MED ORDER — GABAPENTIN 100 MG PO CAPS: 200.0000 mg | ORAL_CAPSULE | Freq: Every day | ORAL | Status: DC

## 2022-09-29 MED ORDER — TRAMADOL HCL 50 MG PO TABS: 25.0000 mg | ORAL_TABLET | Freq: Two times a day (BID) | ORAL | Status: DC | PRN

## 2022-09-29 MED ORDER — MELATONIN 5 MG PO TABS: 10.0000 mg | ORAL_TABLET | Freq: Every day | ORAL | Status: DC

## 2022-09-29 MED ORDER — DIAZEPAM 2 MG PO TABS: 2.0000 mg | ORAL_TABLET | Freq: Two times a day (BID) | ORAL | Status: DC | PRN

## 2022-09-29 MED ORDER — ACETAMINOPHEN 325 MG PO TABS
650.0000 mg | ORAL_TABLET | Freq: Four times a day (QID) | ORAL | Status: DC | PRN
  Administered 2022-09-29: 650 mg via ORAL
  Filled 2022-09-29: qty 2

## 2022-09-29 MED ORDER — BUPROPION HCL ER (XL) 300 MG PO TB24
300.0000 mg | ORAL_TABLET | Freq: Every day | ORAL | Status: DC
  Administered 2022-09-29: 300 mg via ORAL
  Filled 2022-09-29: qty 1

## 2022-09-29 MED ORDER — VENLAFAXINE HCL ER 75 MG PO CP24
150.0000 mg | ORAL_CAPSULE | Freq: Every day | ORAL | Status: DC
  Administered 2022-09-29: 150 mg via ORAL
  Filled 2022-09-29: qty 2

## 2022-09-29 MED ORDER — ACETAMINOPHEN 650 MG RE SUPP: 650.0000 mg | Freq: Four times a day (QID) | RECTAL | Status: DC | PRN

## 2022-09-29 MED ORDER — FOSFOMYCIN TROMETHAMINE 3 G PO PACK
3.0000 g | PACK | Freq: Once | ORAL | Status: AC
  Administered 2022-09-29: 3 g via ORAL
  Filled 2022-09-29: qty 3

## 2022-09-29 NOTE — Evaluation (Signed)
Physical Therapy Evaluation Patient Details Name: Ashley [name] MRN: [id number] DOB: 04/13/1941 Today's Date: 09/29/2022  History of Present Illness  Ashley [name] is a 82 y.o. female with medical history significant of essential hypertension, hyperlipidemia, GERD, history of CVA, with recurrent falls who sustained episode of fall and weakness at home.  Patient came to the ER where she was seen and evaluated.  She appears to have UTI as well as early sepsis.   Clinical Impression  Pt admitted with above diagnosis. Pt received upright in bed agreeable to PT services. Pt reports at baseline she is indep to mod-I when using RW. She lives alone now as her husband passed away in 04-Sep-2022 and daughter lives 1.5 hours away. She endorses a couple falls prior to admission due to not using her RW or [facility] stating she knows she needs to use it more.    To date, pt exits bed mod-I with bed features. Pt stands to RW at supervision level but does require VC's for hand placement. Pt appears functionally strong with transfers and gait ambulating ~200' total. 100' with RW is at supervision level, minimal step through gait appears steady with use. Pt then ambulates 100' without AD with shuffling gait, some variable step lengths and mild lateral unsteadiness requiring minguard for balance. Pt safely demonstrates stair completion with rail use at supervision level as well. Pt returns to recliner with all needs in reach. Heavy education provided on RW and SPC use now that pt lives alone and has history of falls. Educated pt she is a lot more steady with her RW compared to without. Encouraged SPC use when exiting home and in community as I would have concerns trying to safely manage RW up and down stairs and car transfers which pt is in agreement with. Pt with all needs in reach. Pt currently with functional limitations due to the deficits listed below (see PT Problem List). Pt will benefit from skilled PT to increase their  independence and safety with mobility to allow discharge to the venue listed below.        Recommendations for follow up therapy are one component of a multi-disciplinary discharge planning process, led by the attending physician.  Recommendations may be updated based on patient status, additional functional criteria and insurance authorization.  Follow Up Recommendations Home health PT      Assistance Recommended at Discharge PRN  Patient can return home with the following  Help with stairs or ramp for entrance    Equipment Recommendations None recommended by PT  Recommendations for Other Services       Functional Status Assessment Patient has had a recent decline in their functional status and demonstrates the ability to make significant improvements in function in a reasonable and predictable amount of time.     Precautions / Restrictions Precautions Precautions: Fall Restrictions Weight Bearing Restrictions: No      Mobility  Bed Mobility Overal bed mobility: Modified Independent               Patient Response: Cooperative  Transfers Overall transfer level: Needs assistance Equipment used: Rolling walker (2 wheels) Transfers: Sit to/from Stand Sit to Stand: Supervision           General transfer comment: VC's for hand placement    Ambulation/Gait Ambulation/Gait assistance: Supervision, Min guard Gait Distance (Feet): 200 Feet Assistive device: Rolling walker (2 wheels), None (100' with RW, 100' with no AD) Gait Pattern/deviations: Step-to pattern, Decreased step length - right, Decreased  step length - left, Shuffle, Narrow base of support       General Gait Details: pt ambulates with supervision using RW but reliant on CGA without AD. significant shuffling and poor foot clearance without AD.  Stairs Stairs: Yes Stairs assistance: Supervision Stair Management: Step to pattern, Forwards, Two rails Number of Stairs: 4 General stair comments:  Asc/desc with step to pattern. Safe performance using rails.  Wheelchair Mobility    Modified Rankin (Stroke Patients Only)       Balance Overall balance assessment: Needs assistance Sitting-balance support: Bilateral upper extremity supported, Feet supported Sitting balance-Leahy Scale: Good       Standing balance-Leahy Scale: Fair Standing balance comment: light UE use on RW                             Pertinent Vitals/Pain Pain Assessment Pain Assessment: No/denies pain    Home Living Family/patient expects to be discharged to:: Private residence Living Arrangements: Alone Available Help at Discharge: Family;Available PRN/intermittently Type of Home: House Home Access: Stairs to enter Entrance Stairs-Rails: Can reach both Entrance Stairs-Number of Steps: 3   Home Layout: One level Home Equipment: Conservation officer, nature (2 wheels);Cane - single point;Grab bars - tub/shower;Shower seat;Hand held shower head Additional Comments: per pt, has daughter but does not plan on her staying to assist her.    Prior Function Prior Level of Function : Independent/Modified Independent             Mobility Comments: independent, intermittently uses RW but reports not like she should       Hand Dominance        Extremity/Trunk Assessment   Upper Extremity Assessment Upper Extremity Assessment: Overall WFL for tasks assessed    Lower Extremity Assessment Lower Extremity Assessment: Generalized weakness    Cervical / Trunk Assessment Cervical / Trunk Assessment: Normal  Communication   Communication: No difficulties  Cognition Arousal/Alertness: Awake/alert Behavior During Therapy: WFL for tasks assessed/performed Overall Cognitive Status: Within Functional Limits for tasks assessed                                          General Comments      Exercises Other Exercises Other Exercises: Role of PT in acute setting, d/c recs, falls  reduction strategies with use of AD in home versus community.   Assessment/Plan    PT Assessment Patient needs continued PT services  PT Problem List Decreased strength;Decreased balance       PT Treatment Interventions DME instruction;Balance training;Gait training;Neuromuscular re-education;Stair training;Functional mobility training;Patient/family education;Therapeutic activities;Therapeutic exercise    PT Goals (Current goals can be found in the Care Plan section)  Acute Rehab PT Goals Patient Stated Goal: to return home when medically ready PT Goal Formulation: With patient Time For Goal Achievement: 10/13/22 Potential to Achieve Goals: Good    Frequency Min 2X/week     Co-evaluation               AM-PAC PT "6 Clicks" Mobility  Outcome Measure Help needed turning from your back to your side while in a flat bed without using bedrails?: A Little Help needed moving from lying on your back to sitting on the side of a flat bed without using bedrails?: A Little Help needed moving to and from a bed to a chair (including a wheelchair)?: A  Little Help needed standing up from a chair using your arms (e.g., wheelchair or bedside chair)?: A Little Help needed to walk in hospital room?: A Little Help needed climbing 3-5 steps with a railing? : A Little 6 Click Score: 18    End of Session Equipment Utilized During Treatment: Gait belt Activity Tolerance: Patient tolerated treatment well Patient left: in chair;with call bell/phone within reach;with chair alarm set Nurse Communication: Mobility status PT Visit Diagnosis: Other abnormalities of gait and mobility (R26.89);History of falling (Z91.81)    Time: [id number] PT Time Calculation (min) (ACUTE ONLY): 23 min   Charges:   PT Evaluation $PT Eval Low Complexity: 1 Low PT Treatments $Self Care/Home Management: 8-22       Salem Caster. Fairly IV, PT, DPT Physical Therapist- [facility]   09/29/2022, 10:11 AM

## 2022-09-29 NOTE — Telephone Encounter (Signed)
Debbie from [facility] called requesting to make an hospital follow-up for pt. Pt is scheduled for 3/6 at 11am

## 2022-09-29 NOTE — TOC Transition Note (Signed)
Transition of Care [facility]) - CM/SW Discharge Note   Patient Details  Name: Ashley [name] MRN: [id number] Date of Birth: March 07, 1941  [facility] CM/SW Contact:  Gerilyn Pilgrim, LCSW Phone Number: 09/29/2022, 11:23 AM   Clinical Narrative:  CSW spoke with pt regarding [place] services at discharge. Pt reports she would like to use [facility] for PT/OT/RN/SW. Pt reports she has all DME needed for discharge and states she is working out a plan for her daughter to try to stay with her some at discharge as pt lost her husband just a few weeks ago. Pt currently active with Dr. Josephina Gip and had a appt with him in the past few weeks for an assessment. CSW signing off.           Patient Goals and CMS Choice      Discharge Placement                         Discharge Plan and Services Additional resources added to the After Visit Summary for                                       Social Determinants of Health (SDOH) Interventions SDOH Screenings   Food Insecurity: No Food Insecurity (09/29/2022)  Housing: Low Risk  (09/29/2022)  Transportation Needs: No Transportation Needs (09/29/2022)  Utilities: Not At Risk (09/29/2022)  Depression (PHQ2-9): Medium Risk (08/28/2022)  Financial Resource Strain: Low Risk  (01/03/2022)  Physical Activity: Unknown (04/19/2018)  Social Connections: Unknown (01/03/2022)  Stress: No Stress Concern Present (01/03/2022)  Tobacco Use: Medium Risk (09/28/2022)     Readmission Risk Interventions     No data to display

## 2022-09-29 NOTE — Care Management CC44 (Signed)
Condition Code 44 Documentation Completed  Patient Details  Name: Ashley [name] MRN: [id number] Date of Birth: [birth year]   Condition Code 44 given:    Patient signature on Condition Code 44 notice:    Documentation of 2 MD's agreement:    Code 44 added to claim:       Gerilyn Pilgrim, [place] 09/29/2022, 11:33 AM

## 2022-09-29 NOTE — Evaluation (Signed)
Occupational Therapy Evaluation Patient Details Name: Ashley [name] MRN: [id number] DOB: May 09, 1941 Today's Date: 09/29/2022   History of Present Illness Pt is a 82 year old female admitted with UTI and early sepsis after a fall at home; PMH significant for essential hypertension, hyperlipidemia, GERD, history of CVA   Clinical Impression   Chart reviewed, pt greeted in chair agreeable to OT evaluation. Pt is alert and oriented x4, fair-good safety awareness and awareness of deficits. 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 Pt presents with deficits in activity tolerance and mild awareness of deficits affecting safe and optimal ADL completion. Recommend discharge with HHOT to address functional deficits, also discussed HH SW as pt lives alone, family support is 1.5 hrs away per her report. Would recommend initial assist for med management as well. OT will continue to follow acutely.      Recommendations for follow up therapy are one component of a multi-disciplinary discharge planning process, led by the attending physician.  Recommendations may be updated based on patient status, additional functional criteria and insurance authorization.   Follow Up Recommendations  Home health OT     Assistance Recommended at Discharge Intermittent Supervision/Assistance  Patient can return home with the following Direct supervision/assist for medications management;Assistance with cooking/housework    Functional Status Assessment  Patient has had a recent decline in their functional status and demonstrates the ability to make significant improvements in function in a reasonable and predictable amount of time.  Equipment Recommendations  None recommended by OT    Recommendations for Other Services       Precautions / Restrictions Precautions Precautions: Fall Restrictions Weight Bearing Restrictions: No      Mobility Bed  Mobility               General bed mobility comments: NT in recliner pre/post sessoin    Transfers Overall transfer level: Needs assistance Equipment used: Rolling walker (2 wheels) Transfers: Sit to/from Stand Sit to Stand: Supervision                  Balance Overall balance assessment: Needs assistance Sitting-balance support: Bilateral upper extremity supported, Feet supported Sitting balance-Leahy Scale: Good     Standing balance support: Single extremity supported Standing balance-Leahy Scale: Fair                             ADL either performed or assessed with clinical judgement   ADL Overall ADL's : Needs assistance/impaired     Grooming: Wash/dry hands;Standing;Supervision/safety Grooming Details (indicate cue type and reason): sink level             Lower Body Dressing: Supervision/safety   Toilet Transfer: Supervision/safety;Ambulation;Regular Glass blower/designer Details (indicate cue type and reason): with RW Toileting- Clothing Manipulation and Hygiene: Supervision/safety;Sit to/from stand       Functional mobility during ADLs: Supervision/safety;Rolling walker (2 wheels) (approx 30' in room)       Vision Patient Visual Report: No change from baseline       Perception     Praxis      Pertinent Vitals/Pain Pain Assessment Pain Assessment: No/denies pain     Hand Dominance Right   Extremity/Trunk Assessment Upper Extremity Assessment Upper Extremity Assessment: Overall WFL for tasks assessed   Lower Extremity Assessment Lower Extremity Assessment: Generalized weakness   Cervical / Trunk Assessment Cervical / Trunk Assessment: Normal  Communication Communication Communication: No difficulties   Cognition Arousal/Alertness: Awake/alert Behavior During Therapy: WFL for tasks assessed/performed Overall Cognitive Status: No family/caregiver present to determine baseline cognitive functioning Area of  Impairment: Safety/judgement, Problem solving                         Safety/Judgement: Decreased awareness of safety   Problem Solving: Requires verbal cues       General Comments       Exercises     Shoulder Instructions      Home Living Family/patient expects to be discharged to:: Private residence Living Arrangements: Alone Available Help at Discharge: Family;Available PRN/intermittently Type of Home: House Home Access: Stairs to enter [employer] of Steps: 3 Entrance Stairs-Rails: Can reach both Home Layout: One level     Bathroom Shower/Tub:  (walk in tub with bench)   Bathroom Toilet: Standard     Home Equipment: Conservation officer, nature (2 wheels);Cane - single point;Grab bars - tub/shower;Shower seat;Hand held shower head   Additional Comments: per pt, has daughter but does not plan on her staying to assist her.      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        OT Problem List: Decreased safety awareness;Decreased activity tolerance      OT Treatment/Interventions: Self-care/ADL training;Therapeutic exercise;Therapeutic activities;DME and/or AE instruction;Cognitive remediation/compensation;Patient/family education    OT Goals(Current goals can be found in the care plan section) Acute Rehab OT Goals Patient Stated Goal: go home OT Goal Formulation: With patient Time For Goal Achievement: 10/13/22 Potential to Achieve Goals: Good ADL Goals Pt Will Perform Grooming: with modified independence;sitting;standing Pt Will Perform Lower Body Dressing: with modified independence Pt Will Transfer to Toilet: with modified independence;ambulating Pt Will Perform Toileting - Clothing Manipulation and hygiene: with modified  independence;sit to/from stand  OT Frequency: Min 2X/week    Co-evaluation              AM-PAC OT "6 Clicks" Daily Activity     Outcome Measure Help from another person eating meals?: None Help from another person taking care of personal grooming?: None Help from another person toileting, which includes using toliet, bedpan, or urinal?: None Help from another person bathing (including washing, rinsing, drying)?: A Little Help from another person to put on and taking off regular upper body clothing?: None Help from another person to put on and taking off regular lower body clothing?: None 6 Click Score: 23   End of Session Equipment Utilized During Treatment: Rolling walker (2 wheels) Nurse Communication: Mobility status  Activity Tolerance: Patient tolerated treatment well Patient left: in chair;with call bell/phone within reach;with chair alarm set  OT Visit Diagnosis: Unsteadiness on feet (R26.81);Other abnormalities of gait and mobility (R26.89)                Time: [id number] OT Time Calculation (min): 13 min Charges:  OT General Charges $OT Visit: 1 Visit OT Evaluation $OT Eval Low Complexity: 1 Low Shanon Payor, OTD OTR/L  09/29/22, 10:37 AM

## 2022-09-29 NOTE — Discharge Summary (Addendum)
Physician Discharge Summary   Patient: Ashley [name] MRN: [id number] DOB: [birth year]  Admit date:     09/28/2022  Discharge date: 09/29/22  Discharge Physician: Sharen Hones   PCP: Leone Haven, MD   Recommendations at discharge:    Follow with pcp in 1 week  Discharge Diagnoses: Active Problems:   Essential hypertension   GERD (gastroesophageal reflux disease)   Anxiety and depression   Hyperlipidemia   UTI (urinary tract infection)   Overweight (BMI 25.0-29.9) Sepsis ruled out. Resolved Problems:   * No resolved hospital problems. *  Hospital Course:  Ashley [name] is a 82 y.o. female with medical history significant of essential hypertension, hyperlipidemia, GERD, history of CVA, with recurrent falls who sustained episode of fall and weakness at home.  Patient was found to have abnormal urine, started on Rocephin for UTI.  Patient really does not have any urinary symptoms.  Urine culture sent out, patient wished to go home, I will give her dose of fosfomycin to finish antibiotics.  Assessment and Plan: Generalized weakness and a fall. Suspect UTI. Patient has been seen by PT/OT, recommended home therapy.  Will set up home PT/OT. Complete antibiotics with a dose of fosfomycin  Essential hypertension. Due to frequent falls, I will discontinue hydrochlorothiazide Blood pressures not significant elevated.  Anxiety depression. Resume home regimen.         Consultants: None Procedures performed: None  Disposition: Home health Diet recommendation:  Discharge Diet Orders (From admission, onward)     Start     Ordered   09/29/22 0000  Diet - low sodium heart healthy        09/29/22 1111           Cardiac diet DISCHARGE MEDICATION: Allergies as of 09/29/2022       Reactions   Ace Inhibitors Other (See Comments)   Angioedema   Pravastatin Other (See Comments)   Other reaction(s): Muscle Pain   Simvastatin Other (See Comments)   stiffness         Medication List     STOP taking these medications    hydrochlorothiazide 25 MG tablet Commonly known as: HYDRODIURIL   Nasacort Allergy 24HR 55 MCG/ACT Aero nasal inhaler Generic drug: triamcinolone       TAKE these medications    acetaminophen 650 MG CR tablet Commonly known as: TYLENOL Take 1,300 mg by mouth every 8 (eight) hours as needed for pain.   aspirin EC 81 MG tablet Take 81 mg by mouth daily. Swallow whole.   azelastine 0.05 % ophthalmic solution Commonly known as: OPTIVAR Apply to eye.   buPROPion 300 MG 24 hr tablet Commonly known as: WELLBUTRIN XL TAKE ONE TABLET EVERY DAY   diazepam 2 MG tablet Commonly known as: Valium Take 1 tablet (2 mg total) by mouth every 12 (twelve) hours as needed for anxiety.   EPINEPHrine 0.3 mg/0.3 mL Soaj injection Commonly known as: EpiPen 2-Pak Inject 0.3 mLs (0.3 mg total) into the muscle as needed for anaphylaxis.   fluticasone 50 MCG/ACT nasal spray Commonly known as: FLONASE Place 1 spray into both nostrils daily.   gabapentin 100 MG capsule Commonly known as: NEURONTIN Take 2 capsules (200 mg total) by mouth at bedtime.   Melatonin 10 MG Tabs Take 10 mg by mouth at bedtime.   omeprazole 20 MG capsule Commonly known as: PRILOSEC TAKE 1 CAPSULE BY MOUTH ONCE DAILY   rosuvastatin 40 MG tablet Commonly known as: CRESTOR TAKE 1 TABLET BY  MOUTH ONCE DAILY   Systane 0.4-0.3 % Soln Generic drug: Polyethyl Glycol-Propyl Glycol Place 1 drop into both eyes 3 (three) times daily as needed (dry/irritated eyes.).   traMADol 50 MG tablet Commonly known as: ULTRAM Take 25-50 mg by mouth 2 (two) times daily as needed.   venlafaxine XR 75 MG 24 hr capsule Commonly known as: EFFEXOR-XR Take 3 capsules (225 mg total) by mouth daily. What changed: how much to take        Follow-up Information     Leone Haven, MD Follow up in 1 week(s).   Specialty: Family Medicine Contact information: [address] [place] [address] [phone]                Discharge Exam: [facility] Weights   09/28/22 2038 09/29/22 0035  Weight: 63.5 kg 64.7 kg   General exam: Appears calm and comfortable  Respiratory system: Clear to auscultation. Respiratory effort normal. Cardiovascular system: S1 & S2 heard, RRR. No JVD, murmurs, rubs, gallops or clicks. No pedal edema. Gastrointestinal system: Abdomen is nondistended, soft and nontender. No organomegaly or masses felt. Normal bowel sounds heard. Central nervous system: Alert and oriented. No focal neurological deficits. Extremities: Symmetric 5 x 5 power. Skin: No rashes, lesions or ulcers Psychiatry: Judgement and insight appear normal. Mood & affect appropriate.    Condition at discharge: good  The results of significant diagnostics from this hospitalization (including imaging, microbiology, ancillary and laboratory) are listed below for reference.   Imaging Studies: CT Head Wo Contrast  Result Date: 09/28/2022 CLINICAL DATA:  Head trauma EXAM: CT HEAD WITHOUT CONTRAST TECHNIQUE: Contiguous axial images were obtained from the base of the skull through the vertex without intravenous contrast. RADIATION DOSE REDUCTION: This exam was performed according to the departmental dose-optimization program which includes automated exposure control, adjustment of the mA and/or kV according to patient size and/or use of iterative reconstruction technique. COMPARISON:  02/20/2022 FINDINGS: Brain: There is no mass, hemorrhage or extra-axial collection. There is generalized atrophy without lobar predilection. Hypodensity of the white matter is most commonly associated with chronic microvascular disease. Vascular: Atherosclerotic calcification of the internal carotid arteries at the skull base. No abnormal hyperdensity of the major intracranial arteries or dural venous sinuses. Skull: The visualized skull base, calvarium and extracranial soft  tissues are normal. Sinuses/Orbits: No fluid levels or advanced mucosal thickening of the visualized paranasal sinuses. No mastoid or middle ear effusion. The orbits are normal. IMPRESSION: 1. No acute intracranial abnormality. 2. Generalized atrophy and findings of chronic microvascular disease. Electronically Signed   By: Ulyses Jarred M.D.   On: 09/28/2022 21:26    Microbiology: Results for orders placed or performed during the hospital encounter of 09/28/22  Resp panel by RT-PCR (RSV, Flu A&B, Covid) Anterior Nasal Swab     Status: None   Collection Time: 09/28/22  8:41 PM   Specimen: Anterior Nasal Swab  Result Value Ref Range Status   SARS Coronavirus 2 by RT PCR NEGATIVE NEGATIVE Final    Comment: (NOTE) SARS-CoV-2 target nucleic acids are NOT DETECTED.  The SARS-CoV-2 RNA is generally detectable in upper respiratory specimens during the acute phase of infection. The lowest concentration of SARS-CoV-2 viral copies this assay can detect is 138 copies/mL. A negative result does not preclude SARS-Cov-2 infection and should not be used as the sole basis for treatment or other patient management decisions. A negative result may occur with  improper specimen collection/handling, submission of specimen other than  nasopharyngeal swab, presence of viral mutation(s) within the areas targeted by this assay, and inadequate number of viral copies(<138 copies/mL). A negative result must be combined with clinical observations, patient history, and epidemiological information. The expected result is Negative.  Fact Sheet for Patients:  EntrepreneurPulse.com.au  Fact Sheet for Healthcare Providers:  IncredibleEmployment.be  This test is no t yet approved or cleared by the [place] FDA and  has been authorized for detection and/or diagnosis of SARS-CoV-2 by FDA under an Emergency Use Authorization (EUA). This EUA will remain  in effect (meaning this test  can be used) for the duration of the COVID-19 declaration under Section 564(b)(1) of the Act, 21 U.S.C.section 360bbb-3(b)(1), unless the authorization is terminated  or revoked sooner.       Influenza A by PCR NEGATIVE NEGATIVE Final   Influenza B by PCR NEGATIVE NEGATIVE Final    Comment: (NOTE) The Xpert Xpress SARS-CoV-2/FLU/RSV plus assay is intended as an aid in the diagnosis of influenza from Nasopharyngeal swab specimens and should not be used as a sole basis for treatment. Nasal washings and aspirates are unacceptable for Xpert Xpress SARS-CoV-2/FLU/RSV testing.  Fact Sheet for Patients: EntrepreneurPulse.com.au  Fact Sheet for Healthcare Providers: IncredibleEmployment.be  This test is not yet approved or cleared by the [place] FDA and has been authorized for detection and/or diagnosis of SARS-CoV-2 by FDA under an Emergency Use Authorization (EUA). This EUA will remain in effect (meaning this test can be used) for the duration of the COVID-19 declaration under Section 564(b)(1) of the Act, 21 U.S.C. section 360bbb-3(b)(1), unless the authorization is terminated or revoked.     Resp Syncytial Virus by PCR NEGATIVE NEGATIVE Final    Comment: (NOTE) Fact Sheet for Patients: EntrepreneurPulse.com.au  Fact Sheet for Healthcare Providers: IncredibleEmployment.be  This test is not yet approved or cleared by the [place] FDA and has been authorized for detection and/or diagnosis of SARS-CoV-2 by FDA under an Emergency Use Authorization (EUA). This EUA will remain in effect (meaning this test can be used) for the duration of the COVID-19 declaration under Section 564(b)(1) of the Act, 21 U.S.C. section 360bbb-3(b)(1), unless the authorization is terminated or revoked.  Performed at [facility], [address] [place], [place] [address]     Labs: CBC: Recent Labs  Lab  09/28/22 2041 09/29/22 0311  WBC 12.8* 11.3*  NEUTROABS 9.7*  --   HGB 14.0 12.7  HCT 43.8 39.1  MCV 97.1 96.3  PLT 288 [id number]   Basic Metabolic Panel: Recent Labs  Lab 09/28/22 2041 09/29/22 0311  NA 135 138  K 3.7 3.5  CL 97* 104  CO2 23 26  GLUCOSE 97 116*  BUN 12 11  CREATININE 0.93 0.84  CALCIUM 9.5 8.7*   Liver Function Tests: Recent Labs  Lab 09/28/22 2041 09/29/22 0311  AST 33 41  ALT 21 19  ALKPHOS 49 39  BILITOT 0.9 0.5  PROT 7.0 6.0*  ALBUMIN 4.3 3.7   CBG: No results for input(s): "GLUCAP" in the last 168 hours.  Discharge time spent: greater than 30 minutes.  Signed: Sharen Hones, MD [facility] 09/29/2022

## 2022-10-01 LAB — URINE CULTURE: Culture: >=100000 — AB

## 2022-10-02 ENCOUNTER — Telehealth: Payer: Self-pay

## 2022-10-02 NOTE — Transitions of Care (Post Inpatient/ED Visit) (Unsigned)
   10/02/2022  Name: Ashley [name] MRN: [id number] DOB: 1941-03-04  Today's TOC FU Call Status: Today's TOC FU Call Status:: Unsuccessul Call (1st Attempt) Unsuccessful Call (1st Attempt) Date: 10/02/22  Attempted to reach the patient regarding the most recent Inpatient/ED visit.  Follow Up Plan: Additional outreach attempts will be made to reach the patient to complete the Transitions of Care (Post Inpatient/ED visit) call.   Signature  Juanda Crumble, [place] Direct Dial [phone]

## 2022-10-04 NOTE — Transitions of Care (Post Inpatient/ED Visit) (Unsigned)
   10/04/2022  Name: Ashley [name] MRN: [id number] DOB: [birth year]  Today's TOC FU Call Status: Today's TOC FU Call Status:: Unsuccessful Call (2nd Attempt) Unsuccessful Call (1st Attempt) Date: 10/02/22 Unsuccessful Call (2nd Attempt) Date: 10/04/22  Attempted to reach the patient regarding the most recent Inpatient/ED visit.  Follow Up Plan: Additional outreach attempts will be made to reach the patient to complete the Transitions of Care (Post Inpatient/ED visit) call.   Signature Juanda Crumble, [place] Direct Dial [phone]

## 2022-10-05 NOTE — Transitions of Care (Post Inpatient/ED Visit) (Signed)
   10/05/2022  Name: Ashley [name] MRN: [id number] DOB: 1941/02/15  Today's TOC FU Call Status: Today's TOC FU Call Status:: Unsuccessful Call (2nd Attempt) Unsuccessful Call (1st Attempt) Date: 10/02/22 Unsuccessful Call (2nd Attempt) Date: 10/04/22  Attempted to reach the patient regarding the most recent Inpatient/ED visit. Patient has already been seen Follow Up Plan: No further outreach attempts will be made at this time. We have been unable to contact the patient.  Signature Juanda Crumble, [place] Direct Dial [phone]

## 2022-10-11 ENCOUNTER — Ambulatory Visit (INDEPENDENT_AMBULATORY_CARE_PROVIDER_SITE_OTHER): Payer: Medicare HMO | Admitting: Family Medicine

## 2022-10-11 VITALS — BP 128/82 | HR 94 | Temp 98.5°F | Wt 141.8 lb

## 2022-10-11 DIAGNOSIS — J309 Allergic rhinitis, unspecified: Secondary | ICD-10-CM | POA: Diagnosis not present

## 2022-10-11 DIAGNOSIS — N3001 Acute cystitis with hematuria: Secondary | ICD-10-CM | POA: Diagnosis not present

## 2022-10-11 DIAGNOSIS — F419 Anxiety disorder, unspecified: Secondary | ICD-10-CM

## 2022-10-11 DIAGNOSIS — R296 Repeated falls: Secondary | ICD-10-CM

## 2022-10-11 DIAGNOSIS — I10 Essential (primary) hypertension: Secondary | ICD-10-CM

## 2022-10-11 DIAGNOSIS — F32A Depression, unspecified: Secondary | ICD-10-CM

## 2022-10-11 LAB — POCT URINALYSIS DIPSTICK
Bilirubin, UA: NEGATIVE
Blood, UA: POSITIVE
Glucose, UA: NEGATIVE
Ketones, UA: NEGATIVE
Nitrite, UA: NEGATIVE
Protein, UA: POSITIVE — AB
Spec Grav, UA: 1.01 (ref 1.010–1.025)
Urobilinogen, UA: 0.2 E.U./dL
pH, UA: 8 (ref 5.0–8.0)

## 2022-10-11 LAB — URINALYSIS, MICROSCOPIC ONLY

## 2022-10-11 NOTE — Assessment & Plan Note (Signed)
Patient will monitor her current symptoms.  If worsening she will let us know.

## 2022-10-11 NOTE — Progress Notes (Addendum)
Tommi Rumps, MD Phone: [phone]  Ashley [name] is a 82 y.o. female who presents today for f/u.  Anxiety/depression patient notes this is doing better.  She is working on trying to get into hospice grief counseling.  She continues on Wellbutrin and Effexor.  She notes no SI.  Fall: Patient had a fall and she had trouble getting up.  She had to call EMS to come help get her up and they took her to the hospital for evaluation.  She was hospitalized for 1 night and they recommended home health occupational therapy and physical therapy.  Has not had any additional falls since discharge.  They did find a UTI and she noted no dysuria or urinary frequency.  She notes occasional urinary urgency.  She wonders if the UTI has cleared up.  Hypertension: She does not check her blood pressure.  They discontinued her hydrochlorothiazide in the hospital.  She notes no chest pain, shortness of breath, or edema.  Allergic rhinitis: Patient notes some stuffiness recently.  She took some over-the-counter sinus pills earlier today and that has been beneficial.  Social History   Tobacco Use  Smoking Status Former   Packs/day: 0.50   Years: 15.00   Total pack years: 7.50   Types: Cigarettes   Quit date: 08/07/1976   Years since quitting: 46.2  Smokeless Tobacco Never    Current Outpatient Medications on File Prior to Visit  Medication Sig Dispense Refill   acetaminophen (TYLENOL) 650 MG CR tablet Take 1,300 mg by mouth every 8 (eight) hours as needed for pain.     aspirin EC 81 MG tablet Take 81 mg by mouth daily. Swallow whole.     azelastine (OPTIVAR) 0.05 % ophthalmic solution Apply to eye.     buPROPion (WELLBUTRIN XL) 150 MG 24 hr tablet Take 1 tablet (150 mg total) by mouth daily.     diazepam (VALIUM) 2 MG tablet Take 1 tablet (2 mg total) by mouth every 12 (twelve) hours as needed for anxiety. 10 tablet 0   EPINEPHrine (EPIPEN 2-PAK) 0.3 mg/0.3 mL IJ SOAJ injection Inject 0.3 mLs (0.3 mg  total) into the muscle as needed for anaphylaxis. 1 each 1   fluticasone (FLONASE) 50 MCG/ACT nasal spray Place 1 spray into both nostrils daily.     gabapentin (NEURONTIN) 100 MG capsule Take 2 capsules (200 mg total) by mouth at bedtime. 180 capsule 3   Melatonin 10 MG TABS Take 10 mg by mouth at bedtime.     omeprazole (PRILOSEC) 20 MG capsule TAKE 1 CAPSULE BY MOUTH ONCE DAILY 30 capsule 5   Polyethyl Glycol-Propyl Glycol (SYSTANE) 0.4-0.3 % SOLN Place 1 drop into both eyes 3 (three) times daily as needed (dry/irritated eyes.).     rosuvastatin (CRESTOR) 40 MG tablet TAKE 1 TABLET BY MOUTH ONCE DAILY 90 tablet 1   traMADol (ULTRAM) 50 MG tablet Take 25-50 mg by mouth 2 (two) times daily as needed.     venlafaxine XR (EFFEXOR-XR) 75 MG 24 hr capsule Take 3 capsules (225 mg total) by mouth daily. (Patient taking differently: Take 150 mg by mouth daily.) 270 capsule 1   No current facility-administered medications on file prior to visit.     ROS see history of present illness  Objective  Physical Exam Vitals:   10/11/22 1200  BP: 128/82  Pulse: 94  Temp: 98.5 F (36.9 C)  SpO2: 98%    BP Readings from Last 3 Encounters:  10/11/22 128/82  09/29/22 Marland Kitchen)  113/56  08/28/22 130/78   Wt Readings from Last 3 Encounters:  10/11/22 141 lb 12.8 oz (64.3 kg)  09/29/22 142 lb 10.2 oz (64.7 kg)  08/28/22 140 lb (63.5 kg)    Physical Exam Constitutional:      General: She is not in acute distress.    Appearance: She is not diaphoretic.  Cardiovascular:     Rate and Rhythm: Normal rate and regular rhythm.     Heart sounds: Normal heart sounds.  Pulmonary:     Effort: Pulmonary effort is normal.     Breath sounds: Normal breath sounds.  Skin:    General: Skin is warm and dry.  Neurological:     Mental Status: She is alert.      Assessment/Plan: Please see individual problem list.  Acute cystitis with hematuria Assessment & Plan: Noted on culture at the hospital.  She notes  occasional urinary urgency.  Will recheck her urine today as it has been several weeks since she finished her antibiotic treatment through the hospital.  Orders: -     POCT urinalysis dipstick -     Urine Culture -     Urine Microscopic  Essential hypertension Assessment & Plan: Chronic issue.  Adequately controlled for age.  We will continue to monitor off of medication.   Allergic rhinitis, unspecified seasonality, unspecified trigger Assessment & Plan: Patient will monitor her current symptoms.  If worsening she will let us know.   Anxiety and depression Assessment & Plan: Chronic issue.  Progressively improving.  She will continue Wellbutrin 150 mg daily and Effexor 225 mg daily.  I did encourage her to follow-up on hospice grief counseling.   Frequent falls Assessment & Plan: No additional falls since hospitalization.  It was recommended that she have home health PT and OT.  I will place orders for this.  Orders: -     Ambulatory referral to [facility]    Return in about 3 months (around 01/11/2023).   Tommi Rumps, MD [place]

## 2022-10-11 NOTE — Assessment & Plan Note (Signed)
Noted on culture at the hospital.  She notes occasional urinary urgency.  Will recheck her urine today as it has been several weeks since she finished her antibiotic treatment through the hospital.

## 2022-10-11 NOTE — Assessment & Plan Note (Signed)
Chronic issue.  Adequately controlled for age.  We will continue to monitor off of medication.

## 2022-10-11 NOTE — Assessment & Plan Note (Signed)
Chronic issue.  Progressively improving.  She will continue Wellbutrin 150 mg daily and Effexor 225 mg daily.  I did encourage her to follow-up on hospice grief counseling.

## 2022-10-11 NOTE — Patient Instructions (Signed)
Nice to see you. Home health should contact you to set up physical therapy and Occupational Therapy. We will contact you with your urine results. Please follow-up with hospice grief counseling to get that set up.

## 2022-10-11 NOTE — Assessment & Plan Note (Signed)
No additional falls since hospitalization.  It was recommended that she have home health PT and OT.  I will place orders for this.

## 2022-10-13 ENCOUNTER — Other Ambulatory Visit: Payer: Self-pay | Admitting: Family

## 2022-10-13 ENCOUNTER — Telehealth: Payer: Self-pay

## 2022-10-13 LAB — URINE CULTURE
MICRO NUMBER:: 14657059
SPECIMEN QUALITY:: ADEQUATE

## 2022-10-13 MED ORDER — AMOXICILLIN 500 MG PO CAPS: 500.0000 mg | ORAL_CAPSULE | Freq: Three times a day (TID) | ORAL | 0 refills | Status: AC

## 2022-10-13 NOTE — Telephone Encounter (Signed)
Left message to call the office back-Urine culture shows gram positive bacteria. I will send in an antibiotic for treatment.

## 2022-10-13 NOTE — Telephone Encounter (Signed)
-----   Message from Kennyth Arnold, [place] sent at 10/13/2022  4:16 PM EST ----- Urine culture shows gram positive bacteria. I will send in an antibiotic for treatment.

## 2022-10-18 ENCOUNTER — Telehealth: Payer: Self-pay

## 2022-10-18 NOTE — Telephone Encounter (Signed)
Left message to call the office back regarding Dr. Sonnenberg's message 

## 2022-10-18 NOTE — Telephone Encounter (Signed)
-----   Message from Leone Haven, MD sent at 10/16/2022 10:47 AM EDT ----- Please the patient.  Her urine culture did not reveal an infection.  The bacteria that grew out is from her external genitals and this does not represent a urine infection.  She can stop the antibiotic that was sent in for her last week.

## 2022-10-19 ENCOUNTER — Telehealth: Payer: Self-pay

## 2022-10-19 NOTE — Telephone Encounter (Signed)
Left message to call the office back.

## 2022-10-19 NOTE — Telephone Encounter (Signed)
Patient returned our call.  I read message from Dr. Tommi Rumps to patient.

## 2022-10-19 NOTE — Telephone Encounter (Signed)
-----   Message from Eric G Sonnenberg, MD sent at 10/16/2022 10:47 AM EDT ----- Please the patient.  Her urine culture did not reveal an infection.  The bacteria that grew out is from her external genitals and this does not represent a urine infection.  She can stop the antibiotic that was sent in for her last week. 

## 2022-10-26 ENCOUNTER — Other Ambulatory Visit: Payer: Self-pay | Admitting: Family Medicine

## 2022-10-30 ENCOUNTER — Ambulatory Visit: Payer: Self-pay | Admitting: Orthopedic Surgery

## 2022-11-27 ENCOUNTER — Ambulatory Visit: Payer: Medicare HMO | Admitting: Family Medicine

## 2022-11-28 ENCOUNTER — Other Ambulatory Visit: Payer: Self-pay | Admitting: Family Medicine

## 2022-12-08 ENCOUNTER — Telehealth: Payer: Self-pay | Admitting: Family Medicine

## 2022-12-08 NOTE — Telephone Encounter (Signed)
Called patient to schedule Medicare Annual Wellness Visit (AWV). Left message for patient to call back and schedule Medicare Annual Wellness Visit (AWV).  Last date of AWV: 01/03/2022  PT HAS [employer] INSURANCE (CALENDAR YEAR)  Please schedule an AWVS appointment at any time with [facility] VISIT.  If any questions, please contact me at [phone].    Thank you,  [facility] Support [facility] Direct dial  [phone]

## 2022-12-11 NOTE — Telephone Encounter (Signed)
Contacted Ashley [name] to schedule their annual wellness visit. Appointment made for 12/15/2022.  Thank you,  [facility] Support [facility] Direct dial  [phone]

## 2022-12-18 ENCOUNTER — Ambulatory Visit (INDEPENDENT_AMBULATORY_CARE_PROVIDER_SITE_OTHER): Payer: Medicare HMO

## 2022-12-18 VITALS — Wt 141.0 lb

## 2022-12-18 DIAGNOSIS — Z0000 Encounter for general adult medical examination without abnormal findings: Secondary | ICD-10-CM

## 2022-12-18 NOTE — Patient Instructions (Signed)
Ashley [name] , Thank you for taking time to come for your Medicare Wellness Visit. I appreciate your ongoing commitment to your health goals. Please review the following plan we discussed and let me know if I can assist you in the future.   These are the goals we discussed:  Goals      Follow up with primary care provider as needed     Stay active as tolerated        This is a list of the screening recommended for you and due dates:  Health Maintenance  Topic Date Due   Pneumonia Vaccine (2 of 2 - PCV) 01/04/2023*   Hepatitis C Screening: USPSTF Recommendation to screen - Ages 18-79 yo.  01/04/2023*   Flu Shot  03/08/2023   Medicare Annual Wellness Visit  12/18/2023   DTaP/Tdap/Td vaccine (2 - Td or Tdap) 12/29/2031   DEXA scan (bone density measurement)  Completed   Zoster (Shingles) Vaccine  Completed   HPV Vaccine  Aged Out   Colon Cancer Screening  Discontinued   COVID-19 Vaccine  Discontinued  *Topic was postponed. The date shown is not the original due date.    Advanced directives: Please bring a copy of your health care power of attorney and living will to the office to be added to your chart at your convenience.   Conditions/risks identified: Aim for 30 minutes of exercise or brisk walking, 6-8 glasses of water, and 5 servings of fruits and vegetables each day.   Next appointment: Follow up in one year for your annual wellness visit    Preventive Care 65 Years and Older, Female Preventive care refers to lifestyle choices and visits with your health care provider that can promote health and wellness. What does preventive care include? A yearly physical exam. This is also called an annual well check. Dental exams once or twice a year. Routine eye exams. Ask your health care provider how often you should have your eyes checked. Personal lifestyle choices, including: Daily care of your teeth and gums. Regular physical activity. Eating a healthy diet. Avoiding tobacco and  drug use. Limiting alcohol use. Practicing safe sex. Taking low-dose aspirin every day. Taking vitamin and mineral supplements as recommended by your health care provider. What happens during an annual well check? The services and screenings done by your health care provider during your annual well check will depend on your age, overall health, lifestyle risk factors, and family history of disease. Counseling  Your health care provider may ask you questions about your: Alcohol use. Tobacco use. Drug use. Emotional well-being. Home and relationship well-being. Sexual activity. Eating habits. History of falls. Memory and ability to understand (cognition). Work and work Astronomer. Reproductive health. Screening  You may have the following tests or measurements: Height, weight, and BMI. Blood pressure. Lipid and cholesterol levels. These may be checked every 5 years, or more frequently if you are over 33 years old. Skin check. Lung cancer screening. You may have this screening every year starting at age 28 if you have a 30-pack-year history of smoking and currently smoke or have quit within the past 15 years. Fecal occult blood test (FOBT) of the stool. You may have this test every year starting at age 74. Flexible sigmoidoscopy or colonoscopy. You may have a sigmoidoscopy every 5 years or a colonoscopy every 10 years starting at age 83. Hepatitis C blood test. Hepatitis B blood test. Sexually transmitted disease (STD) testing. Diabetes screening. This is done by checking your  blood sugar (glucose) after you have not eaten for a while (fasting). You may have this done every 1-3 years. Bone density scan. This is done to screen for osteoporosis. You may have this done starting at age 76. Mammogram. This may be done every 1-2 years. Talk to your health care provider about how often you should have regular mammograms. Talk with your health care provider about your test results, treatment  options, and if necessary, the need for more tests. Vaccines  Your health care provider may recommend certain vaccines, such as: Influenza vaccine. This is recommended every year. Tetanus, diphtheria, and acellular pertussis (Tdap, Td) vaccine. You may need a Td booster every 10 years. Zoster vaccine. You may need this after age 69. Pneumococcal 13-valent conjugate (PCV13) vaccine. One dose is recommended after age 52. Pneumococcal polysaccharide (PPSV23) vaccine. One dose is recommended after age 27. Talk to your health care provider about which screenings and vaccines you need and how often you need them. This information is not intended to replace advice given to you by your health care provider. Make sure you discuss any questions you have with your health care provider. Document Released: 08/20/2015 Document Revised: 04/12/2016 Document Reviewed: 05/25/2015 Elsevier Interactive Patient Education  2017 [place] Prevention in the Home Falls can cause injuries. They can happen to people of all ages. There are many things you can do to make your home safe and to help prevent falls. What can I do on the outside of my home? Regularly fix the edges of walkways and driveways and fix any cracks. Remove anything that might make you trip as you walk through a door, such as a raised step or threshold. Trim any bushes or trees on the path to your home. Use bright outdoor lighting. Clear any walking paths of anything that might make someone trip, such as rocks or tools. Regularly check to see if handrails are loose or broken. Make sure that both sides of any steps have handrails. Any raised decks and porches should have guardrails on the edges. Have any leaves, snow, or ice cleared regularly. Use sand or salt on walking paths during winter. Clean up any spills in your garage right away. This includes oil or grease spills. What can I do in the bathroom? Use night lights. Install grab  bars by the toilet and in the tub and shower. Do not use towel bars as grab bars. Use non-skid mats or decals in the tub or shower. If you need to sit down in the shower, use a plastic, non-slip stool. Keep the floor dry. Clean up any water that spills on the floor as soon as it happens. Remove soap buildup in the tub or shower regularly. Attach bath mats securely with double-sided non-slip rug tape. Do not have throw rugs and other things on the floor that can make you trip. What can I do in the bedroom? Use night lights. Make sure that you have a light by your bed that is easy to reach. Do not use any sheets or blankets that are too big for your bed. They should not hang down onto the floor. Have a firm chair that has side arms. You can use this for support while you get dressed. Do not have throw rugs and other things on the floor that can make you trip. What can I do in the kitchen? Clean up any spills right away. Avoid walking on wet floors. Keep items that you use a lot in  easy-to-reach places. If you need to reach something above you, use a strong step stool that has a grab bar. Keep electrical cords out of the way. Do not use floor polish or wax that makes floors slippery. If you must use wax, use non-skid floor wax. Do not have throw rugs and other things on the floor that can make you trip. What can I do with my stairs? Do not leave any items on the stairs. Make sure that there are handrails on both sides of the stairs and use them. Fix handrails that are broken or loose. Make sure that handrails are as long as the stairways. Check any carpeting to make sure that it is firmly attached to the stairs. Fix any carpet that is loose or worn. Avoid having throw rugs at the top or bottom of the stairs. If you do have throw rugs, attach them to the floor with carpet tape. Make sure that you have a light switch at the top of the stairs and the bottom of the stairs. If you do not have them,  ask someone to add them for you. What else can I do to help prevent falls? Wear shoes that: Do not have high heels. Have rubber bottoms. Are comfortable and fit you well. Are closed at the toe. Do not wear sandals. If you use a stepladder: Make sure that it is fully opened. Do not climb a closed stepladder. Make sure that both sides of the stepladder are locked into place. Ask someone to hold it for you, if possible. Clearly mark and make sure that you can see: Any grab bars or handrails. First and last steps. Where the edge of each step is. Use tools that help you move around (mobility aids) if they are needed. These include: Canes. Walkers. Scooters. Crutches. Turn on the lights when you go into a dark area. Replace any light bulbs as soon as they burn out. Set up your furniture so you have a clear path. Avoid moving your furniture around. If any of your floors are uneven, fix them. If there are any pets around you, be aware of where they are. Review your medicines with your doctor. Some medicines can make you feel dizzy. This can increase your chance of falling. Ask your doctor what other things that you can do to help prevent falls. This information is not intended to replace advice given to you by your health care provider. Make sure you discuss any questions you have with your health care provider. Document Released: 05/20/2009 Document Revised: 12/30/2015 Document Reviewed: 08/28/2014 Elsevier Interactive Patient Education  2017 [employer].

## 2022-12-18 NOTE — Progress Notes (Signed)
Subjective:   Ashley [name] is a 82 y.o. female who presents for Medicare Annual (Subsequent) preventive examination.  Review of Systems    I connected with  [name] on 12/18/22 by a audio enabled telemedicine application and verified that I am speaking with the correct person using two identifiers.  Patient Location: Home  Provider Location: Home Office  I discussed the limitations of evaluation and management by telemedicine. The patient expressed understanding and agreed to proceed.  Cardiac Risk Factors include: advanced age (>89men, >60 women);hypertension     Objective:    Today's Vitals   12/18/22 1124  Weight: 141 lb (64 kg)   Body mass index is 28.48 kg/m.     12/18/2022   11:40 AM 09/29/2022   12:35 AM 09/28/2022    8:39 PM 02/20/2022    2:46 PM 01/03/2022   10:03 AM 12/28/2021    1:26 PM 12/22/2021    2:49 PM  Advanced Directives  Does Patient Have a Medical Advance Directive? Yes Yes Yes Yes Yes Yes Yes  Type of Estate agent of [place];Living will Healthcare Power of [employer] of [place];Out of facility DNR (pink MOST or yellow form) Living will;Healthcare Power of [employer] Power of [employer] Power of [place];Living will Healthcare Power of [place];Living will  Does patient want to make changes to medical advance directive?  No - Patient declined  No - Patient declined No - Patient declined No - Patient declined   Copy of Healthcare Power of Attorney in Chart? No - copy requested No - copy requested No - copy requested No - copy requested No - copy requested No - copy requested No - copy requested  Would patient like information on creating a medical advance directive?    No - Patient declined       Current Medications (verified) Outpatient Encounter Medications as of 12/18/2022  Medication Sig   acetaminophen (TYLENOL) 650 MG CR tablet Take 1,300 mg by mouth every 8 (eight) hours as needed  for pain.   aspirin EC 81 MG tablet Take 81 mg by mouth daily. Swallow whole.   azelastine (OPTIVAR) 0.05 % ophthalmic solution Apply to eye.   buPROPion (WELLBUTRIN XL) 150 MG 24 hr tablet Take 1 tablet (150 mg total) by mouth daily.   cholecalciferol (VITAMIN D3) 25 MCG (1000 UNIT) tablet Take 1,000 Units by mouth daily.   Cranberry-Vitamin C-Probiotic (AZO CRANBERRY PO) Take by mouth.   cyanocobalamin (VITAMIN B12) 1000 MCG tablet Take 1,000 mcg by mouth daily.   EPINEPHrine (EPIPEN 2-PAK) 0.3 mg/0.3 mL IJ SOAJ injection Inject 0.3 mLs (0.3 mg total) into the muscle as needed for anaphylaxis.   fluticasone (FLONASE) 50 MCG/ACT nasal spray Place 1 spray into both nostrils daily.   gabapentin (NEURONTIN) 100 MG capsule Take 2 capsules (200 mg total) by mouth at bedtime.   Magnesium 500 MG CAPS Take by mouth.   Melatonin 10 MG TABS Take 10 mg by mouth at bedtime.   omeprazole (PRILOSEC) 20 MG capsule TAKE 1 CAPSULE BY MOUTH ONCE DAILY   Polyethyl Glycol-Propyl Glycol (SYSTANE) 0.4-0.3 % SOLN Place 1 drop into both eyes 3 (three) times daily as needed (dry/irritated eyes.).   rosuvastatin (CRESTOR) 40 MG tablet TAKE 1 TABLET BY MOUTH ONCE DAILY   traMADol (ULTRAM) 50 MG tablet Take 25-50 mg by mouth 2 (two) times daily as needed.   TURMERIC PO Take by mouth.   venlafaxine XR (EFFEXOR-XR) 75 MG 24 hr  capsule Take 3 capsules (225 mg total) by mouth daily. (Patient taking differently: Take 150 mg by mouth daily.)   diazepam (VALIUM) 2 MG tablet Take 1 tablet (2 mg total) by mouth every 12 (twelve) hours as needed for anxiety. (Patient not taking: Reported on 12/18/2022)   venlafaxine XR (EFFEXOR-XR) 150 MG 24 hr capsule TAKE 1 CAPSULE BY MOUTH EVERY DAY (Patient not taking: Reported on 12/18/2022)   No facility-administered encounter medications on file as of 12/18/2022.    Allergies (verified) Ace inhibitors, Pravastatin, and Simvastatin   History: Past Medical History:  Diagnosis Date    Abnormal uterine bleeding (AUB)    a. benign endometrial polyps on D+C in 03/2017   Anxiety    Arthritis    Carpal tunnel syndrome of left wrist    Cerebral hemorrhage (HCC) 2023   Chicken pox    Depression    GERD (gastroesophageal reflux disease)    History of stress test    a. 08/2017 Lexiscan MV: EF 55-65%. No ischemia/infarct. Low risk.   Hyperlipidemia    Hypertension    Lacunar infarction (HCC)    a. Dx in setting of dizziness/vertigo; b. 02/2018 Carotid U/S: bilateral intimal thickening and atherosclerotic plaque. No hemodynamically significant stenoses; c. 03/2018 MRI Brain: No mass or abnl enhancement. Chronic microvascular ischemic changes and mod volume loss of the brain. 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   Spinal stenosis of lumbar region with radiculopathy 2019   Stroke (HCC)    PT STATES SHE WAS TOLD THIS BY HER PCP BUT PT STATES SHE NEVER KNEW SHE HAD A STROKE-NO RESIDUAL EFFECTS   Vertigo    Past Surgical History:  Procedure Laterality Date   BACK SURGERY  11/19/2017   lumbar laminectomy   CARPAL TUNNEL RELEASE Right 07/30/2019   CARPAL TUNNEL RELEASE Left 02/21/2021   Procedure: CARPAL TUNNEL RELEASE;  Surgeon: Donato Heinz, MD;  Location: [facility];  Service: Orthopedics;  Laterality: Left;   COLONOSCOPY     2003, 2011, 2021   COLONOSCOPY WITH PROPOFOL N/A 12/24/2019   Procedure: COLONOSCOPY WITH PROPOFOL;  Surgeon: Toledo, Boykin Nearing, MD;  Location: [facility];  Service: Gastroenterology;  Laterality: N/A;   CYSTOSCOPY MACROPLASTIQUE IMPLANT N/A 05/15/2019   Procedure: CYSTOSCOPY MACROPLASTIQUE IMPLANT;  Surgeon: Orson Ape, MD;  Location: [facility];  Service: Urology;  Laterality: N/A;   DILATATION & CURETTAGE/HYSTEROSCOPY WITH MYOSURE N/A 03/30/2017   Procedure: DILATATION & CURETTAGE/HYSTEROSCOPY WITH MYOSURE;  Surgeon: Schermerhorn, Ihor Austin, MD;  Location: [facility];  Service: Gynecology;  Laterality: N/A;   EYE SURGERY  Bilateral    Cataract Extraction with IOL   JOINT REPLACEMENT     KNEE ARTHROPLASTY Left 12/01/2020   Procedure: COMPUTER ASSISTED TOTAL KNEE ARTHROPLASTY;  Surgeon: Donato Heinz, MD;  Location: [facility];  Service: Orthopedics;  Laterality: Left;   SKIN LESION EXCISION Right    synovial cyst right AC joint   TONSILLECTOMY AND ADENOIDECTOMY     Age 72   Family History  Problem Relation Age of Onset   Rheum arthritis Mother    Ovarian cancer Sister    COPD Sister    CAD Sister    Heart disease Father    Aneurysm Father    Cancer Brother    Thrombosis Maternal Grandmother        a. DVT   Thrombosis Maternal Grandfather        a. DVT   Thrombosis Paternal Grandmother  a. DVT   Thrombosis Paternal Grandfather        a. DVT   Social History   Socioeconomic History   Marital status: Widowed    Spouse name: [name]   Number of children: 1   Years of education: Not on file   Highest education level: Bachelor's degree (e.g., BA, AB, BS)  Occupational History   Not on file  Tobacco Use   Smoking status: Former    Packs/day: 0.50    Years: 15.00    Additional pack years: 0.00    Total pack years: 7.50    Types: Cigarettes    Quit date: 08/07/1976    Years since quitting: 46.3   Smokeless tobacco: Never  Vaping Use   Vaping Use: Never used  Substance and Sexual Activity   Alcohol use: Yes    Alcohol/week: 1.0 standard drink of alcohol    Types: 1 Glasses of wine per week    Comment: ocassionally   Drug use: No   Sexual activity: Not Currently  Other Topics Concern   Not on file  Social History Narrative   03/07/22 Married    Social Determinants of Health   Financial Resource Strain: Low Risk  (12/18/2022)   Overall Financial Resource Strain (CARDIA)    Difficulty of Paying Living Expenses: Not hard at all  Food Insecurity: No Food Insecurity (12/18/2022)   Hunger Vital Sign    Worried About Running Out of Food in the Last Year: Never true    Ran Out of Food  in the Last Year: Never true  Transportation Needs: No Transportation Needs (12/18/2022)   PRAPARE - Administrator, Civil Service (Medical): No    Lack of Transportation (Non-Medical): No  Physical Activity: Insufficiently Active (12/18/2022)   Exercise Vital Sign    Days of Exercise per Week: 7 days    Minutes of Exercise per Session: 20 min  Stress: No Stress Concern Present (12/18/2022)   [employer] of Occupational Health - Occupational Stress Questionnaire    Feeling of Stress : Not at all  Social Connections: Moderately Integrated (12/18/2022)   Social Connection and Isolation Panel [NHANES]    Frequency of Communication with Friends and Family: More than three times a week    Frequency of Social Gatherings with Friends and Family: More than three times a week    Attends Religious Services: More than 4 times per year    Active Member of [employer] or Organizations: Yes    Attends Banker Meetings: More than 4 times per year    Marital Status: Widowed    Tobacco Counseling Counseling given: Yes   Clinical Intake:  Pre-visit preparation completed: Yes  Pain : No/denies pain     BMI - recorded: 28.48 Nutritional Status: BMI of 19-24  Normal Nutritional Risks: None Diabetes: No  How often do you need to have someone help you when you read instructions, pamphlets, or other written materials from your doctor or pharmacy?: 1 - Never  Diabetic?NO  Interpreter Needed?: No  Information entered by :: Fredirick Maudlin   Activities of Daily Living    12/18/2022   11:44 AM 09/29/2022   12:35 AM  In your present state of health, do you have any difficulty performing the following activities:  Hearing? 1 1  Vision? 0 0  Difficulty concentrating or making decisions? 0 0  Walking or climbing stairs? 0 0  Dressing or bathing? 0 0  Doing errands, shopping? 0 0  Preparing Food and eating ? N   Using the Toilet? N   In the past six months, have  you accidently leaked urine? N   Do you have problems with loss of bowel control? N   Managing your Medications? N   Managing your Finances? N   Housekeeping or managing your Housekeeping? N     Patient Care Team: Glori Luis, MD as PCP - General (Family Medicine) Iran Ouch, MD as PCP - Cardiology (Cardiology) Mosetta Pigeon, MD (Nephrology)  Indicate any recent Medical Services you may have received from other than [facility] providers in the past year (date may be approximate).     Assessment:   This is a routine wellness examination for Jolan.  Hearing/Vision screen Hearing Screening - Comments:: Issues of left ear  Vision Screening - Comments:: Wears rx glasses - up to date with routine eye exams with  Dr.Stoneburner.  Dietary issues and exercise activities discussed: Current Exercise Habits: The patient does not participate in regular exercise at present, Exercise limited by: Other - see comments (history of falls in past)   Goals Addressed             This Visit's Progress    Follow up with primary care provider as needed   On track    Stay active as tolerated      Depression Screen    12/18/2022   11:38 AM 12/18/2022   11:37 AM 10/11/2022   11:22 AM 08/28/2022   10:51 AM 05/09/2022   12:28 PM 01/03/2022   10:50 AM 10/31/2021    2:37 PM  PHQ 2/9 Scores  PHQ - 2 Score 0 0 6 6 0 0 0  PHQ- 9 Score   10 10       Fall Risk    12/18/2022   11:43 AM 10/11/2022   11:22 AM 08/28/2022   10:39 AM 05/09/2022   12:18 PM 03/07/2022   12:11 PM  Fall Risk   Falls in the past year? 0 1 0 1 1  Number falls in past yr: 0 1 0 1 1  Injury with Fall? 0 1 0 1 1  Risk for fall due to : No Fall Risks History of fall(s) No Fall Risks History of fall(s)   Follow up Falls prevention discussed;Falls evaluation completed Falls evaluation completed Falls evaluation completed Falls evaluation completed     FALL RISK PREVENTION PERTAINING TO THE HOME:  Any stairs in or around  the home? No  If so, are there any without handrails? No  Home free of loose throw rugs in walkways, pet beds, electrical cords, etc? No  Adequate lighting in your home to reduce risk of falls? Yes   ASSISTIVE DEVICES UTILIZED TO PREVENT FALLS:  Life alert? Yes  Use of a cane, walker or w/c? Yes  Grab bars in the bathroom? Yes  Shower chair or bench in shower? Yes  Elevated toilet seat or a handicapped toilet? No   TIMED UP AND GO:  Was the test performed?  No, televisit .     Cognitive Function:    03/07/2022   12:11 PM  MMSE - Mini Mental State Exam  Orientation to time 5  Orientation to Place 5  Registration 3  Attention/ Calculation 4  Recall 2  Language- name 2 objects 2  Language- repeat 0  Language- follow 3 step command 3  Language- read & follow direction 1  Write a sentence 1  Copy design 1  Total  score 27        12/18/2022   11:38 AM 04/25/2019   12:01 PM 04/19/2018   12:16 PM  6CIT Screen  What Year? 0 points 0 points 0 points  What month? 0 points 0 points 0 points  What time? 0 points 0 points 0 points  Count back from 20 0 points 0 points 0 points  Months in reverse 0 points 0 points 0 points  Repeat phrase 0 points 0 points 0 points  Total Score 0 points 0 points 0 points    Immunizations Immunization History  Administered Date(s) Administered   Fluad Quad(high Dose 65+) 06/27/2021, 05/09/2022   Influenza, High Dose Seasonal PF 06/08/2016, 04/19/2018, 04/16/2019, 04/16/2019, 06/24/2019   Influenza-Unspecified 05/17/2016, 05/17/2017, 05/29/2020   PFIZER(Purple Top)SARS-COV-2 Vaccination 08/29/2019, 09/19/2019, 12/25/2019, 05/29/2020   Pneumococcal Polysaccharide-23 06/08/2016   Tdap 12/28/2021   Zoster Recombinat (Shingrix) 03/28/2018, 07/22/2018    TDAP status: Up to date  Flu Vaccine status: Up to date  Pneumococcal vaccine status: Declined,  Education has been provided regarding the importance of this vaccine but patient still  declined. Advised may receive this vaccine at local pharmacy or Health Dept. Aware to provide a copy of the vaccination record if obtained from local pharmacy or Health Dept. Verbalized acceptance and understanding.   Covid-19 vaccine status: Completed vaccines  Qualifies for Shingles Vaccine? Yes   Zostavax completed Yes   Shingrix Completed?: Yes  Screening Tests Health Maintenance  Topic Date Due   Pneumonia Vaccine 31+ Years old (2 of 2 - PCV) 01/04/2023 (Originally 06/08/2017)   Hepatitis C Screening  01/04/2023 (Originally 04/28/1959)   INFLUENZA VACCINE  03/08/2023   Medicare Annual Wellness (AWV)  12/18/2023   DTaP/Tdap/Td (2 - Td or Tdap) 12/29/2031   DEXA SCAN  Completed   Zoster Vaccines- Shingrix  Completed   HPV VACCINES  Aged Out   COLONOSCOPY (Pts 45-56yrs Insurance coverage will need to be confirmed)  Discontinued   COVID-19 Vaccine  Discontinued    Health Maintenance  There are no preventive care reminders to display for this patient.   Colorectal cancer screening: No longer required.   Mammogram status: Completed 09/25/22. Repeat every year  Bone Density status: Completed 09/19/21. Results reflect: Bone density results: OSTEOPOROSIS. Repeat every 2 years.  Lung Cancer Screening: (Low Dose CT Chest recommended if Age 48-80 years, 30 pack-year currently smoking OR have quit w/in 15years.) does not qualify.   Additional Screening:  Hepatitis C Screening: does not qualify; due to age  Vision Screening: Recommended annual ophthalmology exams for early detection of glaucoma and other disorders of the eye. Is the patient up to date with their annual eye exam?  Yes  Who is the provider or what is the name of the office in which the patient attends annual eye exams? Dr. Lawana Pai If pt is not established with a provider, would they like to be referred to a provider to establish care? No .   Dental Screening: Recommended annual dental exams for proper oral  hygiene  Community Resource Referral / Chronic Care Management: CRR required this visit?  No   CCM required this visit?  No      Plan:     I have personally reviewed and noted the following in the patient's chart:   Medical and social history Use of alcohol, tobacco or illicit drugs  Current medications and supplements including opioid prescriptions. Patient is not currently taking opioid prescriptions. Functional ability and status Nutritional status Physical activity Advanced  directives List of other physicians Hospitalizations, surgeries, and ER visits in previous 12 months Vitals Screenings to include cognitive, depression, and falls Referrals and appointments  In addition, I have reviewed and discussed with patient certain preventive protocols, quality metrics, and best practice recommendations. A written personalized care plan for preventive services as well as general preventive health recommendations were provided to patient.     Annabell Sabal, CMA   12/18/2022   Nurse Notes: none

## 2023-01-12 ENCOUNTER — Encounter: Payer: Self-pay | Admitting: Family Medicine

## 2023-01-12 ENCOUNTER — Ambulatory Visit (INDEPENDENT_AMBULATORY_CARE_PROVIDER_SITE_OTHER): Payer: Medicare HMO | Admitting: Family Medicine

## 2023-01-12 VITALS — BP 142/84 | HR 92 | Temp 98.3°F | Ht 59.0 in | Wt 144.2 lb

## 2023-01-12 DIAGNOSIS — F32A Depression, unspecified: Secondary | ICD-10-CM

## 2023-01-12 DIAGNOSIS — I10 Essential (primary) hypertension: Secondary | ICD-10-CM

## 2023-01-12 DIAGNOSIS — E785 Hyperlipidemia, unspecified: Secondary | ICD-10-CM | POA: Diagnosis not present

## 2023-01-12 DIAGNOSIS — K219 Gastro-esophageal reflux disease without esophagitis: Secondary | ICD-10-CM

## 2023-01-12 DIAGNOSIS — F419 Anxiety disorder, unspecified: Secondary | ICD-10-CM

## 2023-01-12 DIAGNOSIS — N3941 Urge incontinence: Secondary | ICD-10-CM

## 2023-01-12 LAB — COMPREHENSIVE METABOLIC PANEL
ALT: 11 U/L (ref 0–35)
AST: 17 U/L (ref 0–37)
Albumin: 4.4 g/dL (ref 3.5–5.2)
Alkaline Phosphatase: 42 U/L (ref 39–117)
BUN: 16 mg/dL (ref 6–23)
CO2: 31 mEq/L (ref 19–32)
Calcium: 9.7 mg/dL (ref 8.4–10.5)
Chloride: 103 mEq/L (ref 96–112)
Creatinine, Ser: 0.94 mg/dL (ref 0.40–1.20)
GFR: 57.63 mL/min — ABNORMAL LOW (ref >60.00)
Glucose, Bld: 118 mg/dL — ABNORMAL HIGH (ref 70–99)
Potassium: 4.3 mEq/L (ref 3.5–5.1)
Sodium: 141 mEq/L (ref 135–145)
Total Bilirubin: 0.4 mg/dL (ref 0.2–1.2)
Total Protein: 6.9 g/dL (ref 6.0–8.3)

## 2023-01-12 LAB — LIPID PANEL
Cholesterol: 156 mg/dL (ref 0–200)
HDL: 67.6 mg/dL (ref >39.00)
NonHDL: 88.36
Total CHOL/HDL Ratio: 2
Triglycerides: 313 mg/dL — ABNORMAL HIGH (ref 0.0–149.0)
VLDL: 62.6 mg/dL — ABNORMAL HIGH (ref 0.0–40.0)

## 2023-01-12 LAB — LDL CHOLESTEROL, DIRECT: Direct LDL: 63 mg/dL

## 2023-01-12 MED ORDER — GEMTESA 75 MG PO TABS
75.0000 mg | ORAL_TABLET | Freq: Every day | ORAL | 2 refills | Status: DC
Start: 2023-01-12 — End: 2023-06-06

## 2023-01-12 NOTE — Assessment & Plan Note (Signed)
Chronic issue.  Continue Crestor 40 mg daily.  Check lab work.

## 2023-01-12 NOTE — Assessment & Plan Note (Signed)
Chronic issue.  Improving.  She will continue Wellbutrin 150 mg daily and Effexor 150 mg daily.

## 2023-01-12 NOTE — Assessment & Plan Note (Signed)
Chronic issue.  Well-controlled.  Patient will continue omeprazole 20 mg daily.

## 2023-01-12 NOTE — Assessment & Plan Note (Signed)
Chronic issue.  Previously better controlled.  We will have her return in a month for repeat blood pressure check with me.

## 2023-01-12 NOTE — Assessment & Plan Note (Signed)
Chronic issue.  We will trial Gemtesa 75 mg daily for this.  If not affordable she will let me know.  Discussed minimal risk of side effects with this medication.

## 2023-01-12 NOTE — Progress Notes (Signed)
[name], MD Phone: [phone]  Ashley [name] is a 82 y.o. female who presents today for f/u.  GERD:   Reflux symptoms: no   Abd pain: no   Blood in stool: no  Dysphagia: no   EGD: no  Medication: omeprazole  Hyperlipidemia: Patient is on Crestor.  No chest pain.  Anxiety/depression: She reports this is getting better.  She continues on Wellbutrin and Effexor.  She started going back to church and has joined a grief/widows group which has been helpful.  Urge incontinence: Patient notes urge incontinence mostly during the nighttime though sometimes during the day.  She gets up 3-4 times at night and occasionally cannot make it to the bathroom quickly enough.  Recent UA through nephrology without signs of significant UTI.   Social History   Tobacco Use  Smoking Status Former   Packs/day: 0.50   Years: 15.00   Additional pack years: 0.00   Total pack years: 7.50   Types: Cigarettes   Quit date: 08/07/1976   Years since quitting: 46.4  Smokeless Tobacco Never    Current Outpatient Medications on File Prior to Visit  Medication Sig Dispense Refill   acetaminophen (TYLENOL) 650 MG CR tablet Take 1,300 mg by mouth every 8 (eight) hours as needed for pain.     aspirin EC 81 MG tablet Take 81 mg by mouth daily. Swallow whole.     azelastine (OPTIVAR) 0.05 % ophthalmic solution Apply to eye.     buPROPion (WELLBUTRIN XL) 150 MG 24 hr tablet Take 1 tablet (150 mg total) by mouth daily.     cholecalciferol (VITAMIN D3) 25 MCG (1000 UNIT) tablet Take 1,000 Units by mouth daily.     Cranberry-Vitamin C-Probiotic (AZO CRANBERRY PO) Take by mouth.     cyanocobalamin (VITAMIN B12) 1000 MCG tablet Take 1,000 mcg by mouth daily.     EPINEPHrine (EPIPEN 2-PAK) 0.3 mg/0.3 mL IJ SOAJ injection Inject 0.3 mLs (0.3 mg total) into the muscle as needed for anaphylaxis. 1 each 1   fluticasone (FLONASE) 50 MCG/ACT nasal spray Place 1 spray into both nostrils daily.     gabapentin  (NEURONTIN) 100 MG capsule Take 2 capsules (200 mg total) by mouth at bedtime. 180 capsule 3   Magnesium 500 MG CAPS Take by mouth.     Melatonin 10 MG TABS Take 10 mg by mouth at bedtime.     omeprazole (PRILOSEC) 20 MG capsule TAKE 1 CAPSULE BY MOUTH ONCE DAILY 30 capsule 5   Polyethyl Glycol-Propyl Glycol (SYSTANE) 0.4-0.3 % SOLN Place 1 drop into both eyes 3 (three) times daily as needed (dry/irritated eyes.).     rosuvastatin (CRESTOR) 40 MG tablet TAKE 1 TABLET BY MOUTH ONCE DAILY 90 tablet 1   traMADol (ULTRAM) 50 MG tablet Take 25-50 mg by mouth 2 (two) times daily as needed.     TURMERIC PO Take by mouth.     venlafaxine XR (EFFEXOR-XR) 150 MG 24 hr capsule TAKE 1 CAPSULE BY MOUTH EVERY DAY 90 capsule 1   No current facility-administered medications on file prior to visit.     ROS see history of present illness  Objective  Physical Exam Vitals:   01/12/23 1205  BP: (!) 142/84  Pulse: 92  Temp: 98.3 F (36.8 C)  SpO2: 95%    BP Readings from Last 3 Encounters:  01/12/23 (!) 142/84  10/11/22 128/82  09/29/22 (!) 113/56   Wt Readings from Last 3 Encounters:  01/12/23 144 lb 3.2 oz (  65.4 kg)  12/18/22 141 lb (64 kg)  10/11/22 141 lb 12.8 oz (64.3 kg)    Physical Exam Constitutional:      General: She is not in acute distress.    Appearance: She is not diaphoretic.  Cardiovascular:     Rate and Rhythm: Normal rate and regular rhythm.     Heart sounds: Normal heart sounds.  Pulmonary:     Effort: Pulmonary effort is normal.     Breath sounds: Normal breath sounds.  Musculoskeletal:     Right lower leg: No edema.     Left lower leg: No edema.  Skin:    General: Skin is warm and dry.  Neurological:     Mental Status: She is alert.      Assessment/Plan: Please see individual problem list.  Essential hypertension Assessment & Plan: Chronic issue.  Previously better controlled.  We will have her return in a month for repeat blood pressure check with me.      Anxiety and depression Assessment & Plan: Chronic issue.  Improving.  She will continue Wellbutrin 150 mg daily and Effexor 150 mg daily.   Gastroesophageal reflux disease, unspecified whether esophagitis present Assessment & Plan: Chronic issue.  Well-controlled.  Patient will continue omeprazole 20 mg daily.   Hyperlipidemia, unspecified hyperlipidemia type Assessment & Plan: Chronic issue.  Continue Crestor 40 mg daily.  Check lab work.  Orders: -     Comprehensive metabolic panel -     Lipid panel  Urge incontinence Assessment & Plan: Chronic issue.  We will trial Gemtesa 75 mg daily for this.  If not affordable she will let me know.  Discussed minimal risk of side effects with this medication.  Orders: Leslye Peer; Take 1 tablet (75 mg total) by mouth daily.  Dispense: 30 tablet; Refill: 2    Return in about 1 month (around 02/11/2023) for Blood pressure follow-up with PCP.   [name], MD [facility]

## 2023-01-12 NOTE — Patient Instructions (Signed)
Nice to see you. Please let me know if the Leslye Peer is not affordable.

## 2023-01-17 ENCOUNTER — Ambulatory Visit: Payer: Medicare HMO | Admitting: Family Medicine

## 2023-01-25 ENCOUNTER — Other Ambulatory Visit: Payer: Self-pay | Admitting: Family Medicine

## 2023-02-05 ENCOUNTER — Ambulatory Visit (INDEPENDENT_AMBULATORY_CARE_PROVIDER_SITE_OTHER): Payer: Medicare HMO | Admitting: Family Medicine

## 2023-02-05 ENCOUNTER — Encounter: Payer: Self-pay | Admitting: Family Medicine

## 2023-02-05 DIAGNOSIS — J989 Respiratory disorder, unspecified: Secondary | ICD-10-CM | POA: Diagnosis not present

## 2023-02-05 DIAGNOSIS — R051 Acute cough: Secondary | ICD-10-CM | POA: Diagnosis not present

## 2023-02-05 NOTE — Assessment & Plan Note (Signed)
Suspect viral respiratory illness.  Discussed COVID is a possibility.  Will have her come to the office tomorrow morning for a COVID test.  Discussed if it was COVID we will treat appropriately.  Advised otherwise treatment would be supportive.  She can continue Flonase and cetirizine.  Discussed use of Tylenol for any discomfort.  Discussed if she starts to get more congested she could try Mucinex.  If not improving by Friday she will contact us to consider antibiotic treatment.

## 2023-02-05 NOTE — Progress Notes (Signed)
Virtual Visit via telephone Note  This visit type was conducted due to national recommendations for restrictions regarding the COVID-19 pandemic (e.g. social distancing).  This format is felt to be most appropriate for this patient at this time.  All issues noted in this document were discussed and addressed.  No physical exam was performed (except for noted visual exam findings with Video Visits).   I connected with Ashley [name] today at  4:30 PM EDT by telephone and verified that I am speaking with the correct person using two identifiers. Location patient: home Location provider: work  Persons participating in the virtual visit: patient, provider  I discussed the limitations, risks, security and privacy concerns of performing an evaluation and management service by telephone and the availability of in person appointments. I also discussed with the patient that there may be a patient responsible charge related to this service. The patient expressed understanding and agreed to proceed.  Interactive audio and video telecommunications were attempted between this provider and patient, however failed, due to patient having technical difficulties OR patient did not have access to video capability.  We continued and completed visit with audio only.   Reason for visit: same day visit  HPI: Respiratory illness: Patient notes symptoms started on 02/02/2023.  She felt feverish though was unable to check her temperature.  She started to have some mild congestion in her head and some mild cough with yellow mucus.  She has mild headache.  Mild postnasal drip.  She notes no sore throat, taste or smell disturbances, or shortness of breath.  No known COVID exposures.  She has not tested herself for COVID.  She is not taking any medication for this.  She does take Flonase and cetirizine for chronic allergy issues.  She does report her daughter was sick with a viral illness a couple of weeks ago though the  patient has not had much contact with her.   ROS: See pertinent positives and negatives per HPI.  Past Medical History:  Diagnosis Date   Abnormal uterine bleeding (AUB)    a. benign endometrial polyps on D+C in 03/2017   Anxiety    Arthritis    Carpal tunnel syndrome of left wrist    Cerebral hemorrhage (HCC) 2023   Chicken pox    Depression    GERD (gastroesophageal reflux disease)    History of stress test    a. 08/2017 Lexiscan MV: EF 55-65%. No ischemia/infarct. Low risk.   Hyperlipidemia    Hypertension    Lacunar infarction (HCC)    a. Dx in setting of dizziness/vertigo; b. 02/2018 Carotid U/S: bilateral intimal thickening and atherosclerotic plaque. No hemodynamically significant stenoses; c. 03/2018 MRI Brain: No mass or abnl enhancement. Chronic microvascular ischemic changes and mod volume loss of the brain. 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   Stroke (HCC)    PT STATES SHE WAS TOLD THIS BY HER PCP BUT PT STATES SHE NEVER KNEW SHE HAD A STROKE-NO RESIDUAL EFFECTS   Vertigo     Past Surgical History:  Procedure Laterality Date   BACK SURGERY  11/19/2017   lumbar laminectomy   CARPAL TUNNEL RELEASE Right 07/30/2019   CARPAL TUNNEL RELEASE Left 02/21/2021   Procedure: CARPAL TUNNEL RELEASE;  Surgeon: Donato Heinz, MD;  Location: [facility];  Service: Orthopedics;  Laterality: Left;   COLONOSCOPY  2003, 2011, 2021   COLONOSCOPY WITH PROPOFOL N/A 12/24/2019   Procedure: COLONOSCOPY WITH PROPOFOL;  Surgeon: Toledo, Boykin Nearing, MD;  Location: [facility];  Service: Gastroenterology;  Laterality: N/A;   CYSTOSCOPY MACROPLASTIQUE IMPLANT N/A 05/15/2019   Procedure: CYSTOSCOPY MACROPLASTIQUE IMPLANT;  Surgeon: Orson Ape, MD;  Location: [facility];  Service: Urology;  Laterality: N/A;   DILATATION & CURETTAGE/HYSTEROSCOPY WITH MYOSURE  N/A 03/30/2017   Procedure: DILATATION & CURETTAGE/HYSTEROSCOPY WITH MYOSURE;  Surgeon: Schermerhorn, Ihor Austin, MD;  Location: [facility];  Service: Gynecology;  Laterality: N/A;   EYE SURGERY Bilateral    Cataract Extraction with IOL   JOINT REPLACEMENT     KNEE ARTHROPLASTY Left 12/01/2020   Procedure: COMPUTER ASSISTED TOTAL KNEE ARTHROPLASTY;  Surgeon: Donato Heinz, MD;  Location: [facility];  Service: Orthopedics;  Laterality: Left;   SKIN LESION EXCISION Right    synovial cyst right AC joint   TONSILLECTOMY AND ADENOIDECTOMY     Age 4    Family History  Problem Relation Age of Onset   Rheum arthritis Mother    Ovarian cancer Sister    COPD Sister    CAD Sister    Heart disease Father    Aneurysm Father    Cancer Brother    Thrombosis Maternal Grandmother        a. DVT   Thrombosis Maternal Grandfather        a. DVT   Thrombosis Paternal Grandmother        a. DVT   Thrombosis Paternal Grandfather        a. DVT    SOCIAL HX: Former smoker   Current Outpatient Medications:    acetaminophen (TYLENOL) 650 MG CR tablet, Take 1,300 mg by mouth every 8 (eight) hours as needed for pain., Disp: , Rfl:    aspirin EC 81 MG tablet, Take 81 mg by mouth daily. Swallow whole., Disp: , Rfl:    azelastine (OPTIVAR) 0.05 % ophthalmic solution, Apply to eye., Disp: , Rfl:    buPROPion (WELLBUTRIN XL) 150 MG 24 hr tablet, Take 1 tablet (150 mg total) by mouth daily., Disp: , Rfl:    cholecalciferol (VITAMIN D3) 25 MCG (1000 UNIT) tablet, Take 1,000 Units by mouth daily., Disp: , Rfl:    Cranberry-Vitamin C-Probiotic (AZO CRANBERRY PO), Take by mouth., Disp: , Rfl:    cyanocobalamin (VITAMIN B12) 1000 MCG tablet, Take 1,000 mcg by mouth daily., Disp: , Rfl:    EPINEPHrine (EPIPEN 2-PAK) 0.3 mg/0.3 mL IJ SOAJ injection, Inject 0.3 mLs (0.3 mg total) into the muscle as needed for anaphylaxis., Disp: 1 each, Rfl: 1   fluticasone (FLONASE) 50 MCG/ACT nasal spray, Place 1 spray into both  nostrils daily., Disp: , Rfl:    gabapentin (NEURONTIN) 100 MG capsule, Take 2 capsules (200 mg total) by mouth at bedtime., Disp: 180 capsule, Rfl: 3   Magnesium 500 MG CAPS, Take by mouth., Disp: , Rfl:    Melatonin 10 MG TABS, Take 10 mg by mouth at bedtime., Disp: , Rfl:    omeprazole (PRILOSEC) 20 MG capsule, TAKE 1 CAPSULE BY MOUTH ONCE DAILY, Disp: 30 capsule, Rfl: 5   Polyethyl Glycol-Propyl Glycol (SYSTANE) 0.4-0.3 % SOLN, Place 1 drop into both eyes 3 (three) times daily as needed (dry/irritated eyes.)., Disp: , Rfl:    rosuvastatin (CRESTOR) 40 MG tablet, TAKE 1 TABLET BY MOUTH ONCE DAILY, Disp: 90 tablet, Rfl: 1   traMADol (ULTRAM) 50 MG tablet, Take 25-50 mg by mouth 2 (two)  times daily as needed., Disp: , Rfl:    TURMERIC PO, Take by mouth., Disp: , Rfl:    venlafaxine XR (EFFEXOR-XR) 150 MG 24 hr capsule, TAKE 1 CAPSULE BY MOUTH EVERY DAY, Disp: 90 capsule, Rfl: 1   Vibegron (GEMTESA) 75 MG TABS, Take 1 tablet (75 mg total) by mouth daily., Disp: 30 tablet, Rfl: 2  EXAM:  This was a telephone visit and thus no exam was completed.  ASSESSMENT AND PLAN:  Discussed the following assessment and plan:  Problem List Items Addressed This Visit     Respiratory illness - Primary    Suspect viral respiratory illness.  Discussed COVID is a possibility.  Will have her come to the office tomorrow morning for a COVID test.  Discussed if it was COVID we will treat appropriately.  Advised otherwise treatment would be supportive.  She can continue Flonase and cetirizine.  Discussed use of Tylenol for any discomfort.  Discussed if she starts to get more congested she could try Mucinex.  If not improving by Friday she will contact us to consider antibiotic treatment.      Relevant Orders   POC COVID-19    Return if symptoms worsen or fail to improve.   I discussed the assessment and treatment plan with the patient. The patient was provided an opportunity to ask questions and all were  answered. The patient agreed with the plan and demonstrated an understanding of the instructions.   The patient was advised to call back or seek an in-person evaluation if the symptoms worsen or if the condition fails to improve as anticipated.  I provided 7 minutes of non-face-to-face time during this encounter.   [name], MD

## 2023-02-06 ENCOUNTER — Telehealth: Payer: Self-pay

## 2023-02-06 ENCOUNTER — Other Ambulatory Visit: Payer: Medicare HMO

## 2023-02-06 LAB — POC COVID19 BINAXNOW: SARS Coronavirus 2 Ag: POSITIVE — AB

## 2023-02-06 MED ORDER — NIRMATRELVIR/RITONAVIR (PAXLOVID) TABLET (RENAL DOSING): 2.0000 | ORAL_TABLET | Freq: Two times a day (BID) | ORAL | 0 refills | Status: AC

## 2023-02-06 NOTE — Telephone Encounter (Signed)
Patient would like a call after speaking with provider. She understands she is positive for Covid .

## 2023-02-06 NOTE — Telephone Encounter (Signed)
Pt notified & I sent information to her mychart for reference purposes. Pt states that she doesn't feel too bad, but feels like she has a cold.

## 2023-02-06 NOTE — Telephone Encounter (Signed)
Patient drove up to back door and I swabbed her for COVID and test was positive. Resulted in chart already.

## 2023-02-06 NOTE — Telephone Encounter (Signed)
Please call the patient and let her know her covid test was positive. Please see how she is feeling. I am going to send in paxlovid for her to start on if she is still having symptoms. The idea of treating with this medication is to reduce the risk of the COVID progressing to to severe illness that requires hospitalization or results in death. She will need to hold her crestor while on the paxlovid and can not start the paxlovid until it has been 12 hours after her last dose of crestor. She can resume the crestor 3 days after finishing the paxlovid. If she develops elevated BP on the paxlovid she should let us know. Diarrhea and abnormal taste are possible side effects of the paxlovid. She should stay at home quarantined until she starts to feel better and is without fever for 24 hours. She should wear a mask if she is around others through 7/8.

## 2023-02-06 NOTE — Addendum Note (Signed)
Addended by: [place], Febe Champa on: 02/06/2023 02:08 PM   Modules accepted: Orders

## 2023-02-06 NOTE — Addendum Note (Signed)
Addended by: Glori Luis on: 02/06/2023 03:13 PM   Modules accepted: Orders

## 2023-02-07 NOTE — Telephone Encounter (Signed)
Noted  

## 2023-02-13 ENCOUNTER — Encounter: Payer: Self-pay | Admitting: Family Medicine

## 2023-02-13 ENCOUNTER — Ambulatory Visit (INDEPENDENT_AMBULATORY_CARE_PROVIDER_SITE_OTHER): Payer: Medicare HMO | Admitting: Family Medicine

## 2023-02-13 VITALS — BP 118/80 | HR 100 | Temp 97.9°F | Ht 59.0 in | Wt 142.6 lb

## 2023-02-13 DIAGNOSIS — F419 Anxiety disorder, unspecified: Secondary | ICD-10-CM

## 2023-02-13 DIAGNOSIS — I10 Essential (primary) hypertension: Secondary | ICD-10-CM

## 2023-02-13 DIAGNOSIS — F32A Depression, unspecified: Secondary | ICD-10-CM

## 2023-02-13 DIAGNOSIS — J989 Respiratory disorder, unspecified: Secondary | ICD-10-CM | POA: Diagnosis not present

## 2023-02-13 MED ORDER — BUPROPION HCL ER (XL) 300 MG PO TB24
300.0000 mg | ORAL_TABLET | Freq: Every day | ORAL | 1 refills | Status: DC
Start: 2023-02-13 — End: 2023-11-02

## 2023-02-13 NOTE — Assessment & Plan Note (Signed)
Patient diagnosed with COVID-19 previously.  She has recovered at this time.  She does not need to wear a mask anymore.  No need for further testing.

## 2023-02-13 NOTE — Assessment & Plan Note (Signed)
Chronic issue.  Continues to have some symptoms.  She will continue Effexor 150 mg daily.  Will increase her Wellbutrin to 300 mg daily.  She will follow-up in 2 months.  She will monitor for increasing anxiety with the increased dose of Wellbutrin.

## 2023-02-13 NOTE — Assessment & Plan Note (Signed)
Chronic issue.  Adequately controlled today.  Not on medication at this time.  We will continue to periodically monitor this.

## 2023-02-13 NOTE — Patient Instructions (Addendum)
Nice to see you. We are going to increase your Wellbutrin to 300 mg daily.  I sent a new prescription into your pharmacy.  If you notice increased anxiety with this dose increase please let us know. You will continue Effexor 150 mg daily.

## 2023-02-13 NOTE — Progress Notes (Signed)
[name], MD Phone: [phone]  Ashley [name] is a 82 y.o. female who presents today for f/u.  HYPERTENSION Disease Monitoring Home BP Monitoring not checking Chest pain- no    Dyspnea- no Medications Compliance-  no medications.  Edema- no BMET    Component Value Date/Time   NA 141 01/12/2023 1231   K 4.3 01/12/2023 1231   CL 103 01/12/2023 1231   CO2 31 01/12/2023 1231   GLUCOSE 118 (H) 01/12/2023 1231   BUN 16 01/12/2023 1231   CREATININE 0.94 01/12/2023 1231   CREATININE 0.69 03/11/2018 1648   CALCIUM 9.7 01/12/2023 1231   GFRNONAA >60 09/29/2022 0311   GFRNONAA 86 03/11/2018 1648   GFRAA >60 05/12/2019 1008   GFRAA 99 03/11/2018 1648   Anxiety/depression: Patient notes depression is getting better.  She is on Effexor and Wellbutrin.  She has been going to church which has been helpful.  COVID-19: Patient notes she was diagnosed with COVID a week and a half or so ago.  She still feels tired and has some mild cough though does feel like she is progressively improving.  She wonders if she needs a follow-up test.  Social History   Tobacco Use  Smoking Status Former   Packs/day: 0.50   Years: 15.00   Additional pack years: 0.00   Total pack years: 7.50   Types: Cigarettes   Quit date: 08/07/1976   Years since quitting: 46.5  Smokeless Tobacco Never    Current Outpatient Medications on File Prior to Visit  Medication Sig Dispense Refill   acetaminophen (TYLENOL) 650 MG CR tablet Take 1,300 mg by mouth every 8 (eight) hours as needed for pain.     aspirin EC 81 MG tablet Take 81 mg by mouth daily. Swallow whole.     azelastine (OPTIVAR) 0.05 % ophthalmic solution Apply to eye.     cholecalciferol (VITAMIN D3) 25 MCG (1000 UNIT) tablet Take 1,000 Units by mouth daily.     Cranberry-Vitamin C-Probiotic (AZO CRANBERRY PO) Take by mouth.     cyanocobalamin (VITAMIN B12) 1000 MCG tablet Take 1,000 mcg by mouth daily.     EPINEPHrine (EPIPEN 2-PAK) 0.3 mg/0.3  mL IJ SOAJ injection Inject 0.3 mLs (0.3 mg total) into the muscle as needed for anaphylaxis. 1 each 1   fluticasone (FLONASE) 50 MCG/ACT nasal spray Place 1 spray into both nostrils daily.     gabapentin (NEURONTIN) 100 MG capsule Take 2 capsules (200 mg total) by mouth at bedtime. 180 capsule 3   Magnesium 500 MG CAPS Take by mouth.     Melatonin 10 MG TABS Take 10 mg by mouth at bedtime.     omeprazole (PRILOSEC) 20 MG capsule TAKE 1 CAPSULE BY MOUTH ONCE DAILY 30 capsule 5   Polyethyl Glycol-Propyl Glycol (SYSTANE) 0.4-0.3 % SOLN Place 1 drop into both eyes 3 (three) times daily as needed (dry/irritated eyes.).     rosuvastatin (CRESTOR) 40 MG tablet TAKE 1 TABLET BY MOUTH ONCE DAILY 90 tablet 1   traMADol (ULTRAM) 50 MG tablet Take 25-50 mg by mouth 2 (two) times daily as needed.     TURMERIC PO Take by mouth.     venlafaxine XR (EFFEXOR-XR) 150 MG 24 hr capsule TAKE 1 CAPSULE BY MOUTH EVERY DAY 90 capsule 1   Vibegron (GEMTESA) 75 MG TABS Take 1 tablet (75 mg total) by mouth daily. 30 tablet 2   No current facility-administered medications on file prior to visit.     ROS  see history of present illness  Objective  Physical Exam Vitals:   02/13/23 1140  BP: 118/80  Pulse: 100  Temp: 97.9 F (36.6 C)  SpO2: 96%    BP Readings from Last 3 Encounters:  02/13/23 118/80  01/12/23 (!) 142/84  10/11/22 128/82   Wt Readings from Last 3 Encounters:  02/13/23 142 lb 9.6 oz (64.7 kg)  01/12/23 144 lb 3.2 oz (65.4 kg)  12/18/22 141 lb (64 kg)    Physical Exam Constitutional:      General: She is not in acute distress.    Appearance: She is not diaphoretic.  Cardiovascular:     Rate and Rhythm: Normal rate and regular rhythm.     Heart sounds: Normal heart sounds.  Pulmonary:     Effort: Pulmonary effort is normal.     Breath sounds: Normal breath sounds.  Skin:    General: Skin is warm and dry.  Neurological:     Mental Status: She is alert.      Assessment/Plan:  Please see individual problem list.  Essential hypertension Assessment & Plan: Chronic issue.  Adequately controlled today.  Not on medication at this time.  We will continue to periodically monitor this.   Anxiety and depression Assessment & Plan: Chronic issue.  Continues to have some symptoms.  She will continue Effexor 150 mg daily.  Will increase her Wellbutrin to 300 mg daily.  She will follow-up in 2 months.  She will monitor for increasing anxiety with the increased dose of Wellbutrin.  Orders: -     buPROPion HCl ER (XL); Take 1 tablet (300 mg total) by mouth daily.  Dispense: 90 tablet; Refill: 1  Respiratory illness Assessment & Plan: Patient diagnosed with COVID-19 previously.  She has recovered at this time.  She does not need to wear a mask anymore.  No need for further testing.     Return in about 2 months (around 04/16/2023) for depression.   [name], MD [facility]

## 2023-03-02 ENCOUNTER — Ambulatory Visit: Payer: Medicare HMO | Admitting: Family Medicine

## 2023-03-02 ENCOUNTER — Encounter: Payer: Self-pay | Admitting: Family Medicine

## 2023-03-02 VITALS — BP 118/76 | HR 96 | Temp 98.6°F | Ht 59.0 in | Wt 144.2 lb

## 2023-03-02 DIAGNOSIS — Z87440 Personal history of urinary (tract) infections: Secondary | ICD-10-CM

## 2023-03-02 DIAGNOSIS — N3001 Acute cystitis with hematuria: Secondary | ICD-10-CM | POA: Insufficient documentation

## 2023-03-02 LAB — URINALYSIS, MICROSCOPIC ONLY

## 2023-03-02 LAB — POCT URINALYSIS DIPSTICK
Bilirubin, UA: NEGATIVE
Glucose, UA: NEGATIVE
Ketones, UA: NEGATIVE
Nitrite, UA: POSITIVE
Protein, UA: NEGATIVE
Spec Grav, UA: 1.02 (ref 1.010–1.025)
Urobilinogen, UA: 0.2 E.U./dL
pH, UA: 7 (ref 5.0–8.0)

## 2023-03-02 MED ORDER — NITROFURANTOIN MONOHYD MACRO 100 MG PO CAPS
100.0000 mg | ORAL_CAPSULE | Freq: Two times a day (BID) | ORAL | 0 refills | Status: DC
Start: 2023-03-02 — End: 2023-06-06

## 2023-03-02 NOTE — Patient Instructions (Signed)
Nice to see you. Your urine does look infected. We will place her on Macrobid 100 mg twice daily for 7 days. If your symptoms are not improving please let us know.  We will contact you with your urine culture results.

## 2023-03-02 NOTE — Progress Notes (Signed)
[name], MD Phone: [phone]  Ashley [name] is a 82 y.o. female who presents today for same day visit.   UTI: Patient reports she thinks she may have a UTI.  She has had some lower abdominal discomfort recently with some urinary frequency and urgency.  She notes she has been a little more jittery than typical.  She notes she did have a fall recently as well.  She notes those things all typically happen when she has a UTI.  She notes anxiety and depression are adequately controlled.  Social History   Tobacco Use  Smoking Status Former   Current packs/day: 0.00   Average packs/day: 0.5 packs/day for 15.0 years (7.5 ttl pk-yrs)   Types: Cigarettes   Start date: 08/07/1961   Quit date: 08/07/1976   Years since quitting: 46.5  Smokeless Tobacco Never    Current Outpatient Medications on File Prior to Visit  Medication Sig Dispense Refill   acetaminophen (TYLENOL) 650 MG CR tablet Take 1,300 mg by mouth every 8 (eight) hours as needed for pain.     aspirin EC 81 MG tablet Take 81 mg by mouth daily. Swallow whole.     azelastine (OPTIVAR) 0.05 % ophthalmic solution Apply to eye.     buPROPion (WELLBUTRIN XL) 300 MG 24 hr tablet Take 1 tablet (300 mg total) by mouth daily. 90 tablet 1   cholecalciferol (VITAMIN D3) 25 MCG (1000 UNIT) tablet Take 1,000 Units by mouth daily.     Cranberry-Vitamin C-Probiotic (AZO CRANBERRY PO) Take by mouth.     cyanocobalamin (VITAMIN B12) 1000 MCG tablet Take 1,000 mcg by mouth daily.     EPINEPHrine (EPIPEN 2-PAK) 0.3 mg/0.3 mL IJ SOAJ injection Inject 0.3 mLs (0.3 mg total) into the muscle as needed for anaphylaxis. 1 each 1   fluticasone (FLONASE) 50 MCG/ACT nasal spray Place 1 spray into both nostrils daily.     gabapentin (NEURONTIN) 100 MG capsule Take 2 capsules (200 mg total) by mouth at bedtime. 180 capsule 3   Magnesium 500 MG CAPS Take by mouth.     Melatonin 10 MG TABS Take 10 mg by mouth at bedtime.     omeprazole (PRILOSEC) 20 MG  capsule TAKE 1 CAPSULE BY MOUTH ONCE DAILY 30 capsule 5   Polyethyl Glycol-Propyl Glycol (SYSTANE) 0.4-0.3 % SOLN Place 1 drop into both eyes 3 (three) times daily as needed (dry/irritated eyes.).     rosuvastatin (CRESTOR) 40 MG tablet TAKE 1 TABLET BY MOUTH ONCE DAILY 90 tablet 1   traMADol (ULTRAM) 50 MG tablet Take 25-50 mg by mouth 2 (two) times daily as needed.     TURMERIC PO Take by mouth.     venlafaxine XR (EFFEXOR-XR) 150 MG 24 hr capsule TAKE 1 CAPSULE BY MOUTH EVERY DAY 90 capsule 1   Vibegron (GEMTESA) 75 MG TABS Take 1 tablet (75 mg total) by mouth daily. 30 tablet 2   No current facility-administered medications on file prior to visit.     ROS see history of present illness  Objective  Physical Exam Vitals:   03/02/23 1336  BP: 118/76  Pulse: 96  Temp: 98.6 F (37 C)  SpO2: 96%    BP Readings from Last 3 Encounters:  03/02/23 118/76  02/13/23 118/80  01/12/23 (!) 142/84   Wt Readings from Last 3 Encounters:  03/02/23 144 lb 3.7 oz (65.4 kg)  02/13/23 142 lb 9.6 oz (64.7 kg)  01/12/23 144 lb 3.2 oz (65.4 kg)  Physical Exam Constitutional:      General: She is not in acute distress.    Appearance: She is not diaphoretic.  Cardiovascular:     Rate and Rhythm: Normal rate and regular rhythm.     Heart sounds: Normal heart sounds.  Pulmonary:     Effort: Pulmonary effort is normal.     Breath sounds: Normal breath sounds.  Abdominal:     General: Bowel sounds are normal. There is no distension.     Palpations: Abdomen is soft.     Tenderness: There is no abdominal tenderness.  Skin:    General: Skin is warm and dry.  Neurological:     Mental Status: She is alert.      Assessment/Plan: Please see individual problem list.  Acute cystitis with hematuria Assessment & Plan: Urinalysis and symptoms consistent with UTI.  Will treat with Macrobid 100 mg twice daily.  If not improving she will let us know.  Will send urine for culture  microscopy.  Orders: -     Urine Microscopic -     Urine Culture -     Nitrofurantoin Monohyd Macro; Take 1 capsule (100 mg total) by mouth 2 (two) times daily.  Dispense: 14 capsule; Refill: 0  History of UTI -     POCT urinalysis dipstick     Return if symptoms worsen or fail to improve.   [name], MD [facility]

## 2023-03-02 NOTE — Assessment & Plan Note (Signed)
Urinalysis and symptoms consistent with UTI.  Will treat with Macrobid 100 mg twice daily.  If not improving she will let us know.  Will send urine for culture microscopy.

## 2023-03-06 ENCOUNTER — Telehealth: Payer: Self-pay

## 2023-03-06 NOTE — Telephone Encounter (Signed)
Left message to call the office back to let her know her urine is positive for infection and to complete the antibiotics.

## 2023-03-22 ENCOUNTER — Encounter (INDEPENDENT_AMBULATORY_CARE_PROVIDER_SITE_OTHER): Payer: Self-pay

## 2023-03-27 ENCOUNTER — Other Ambulatory Visit: Payer: Self-pay | Admitting: Family Medicine

## 2023-04-16 ENCOUNTER — Ambulatory Visit: Payer: Medicare HMO | Admitting: Family Medicine

## 2023-04-25 ENCOUNTER — Ambulatory Visit (INDEPENDENT_AMBULATORY_CARE_PROVIDER_SITE_OTHER): Payer: Medicare HMO | Admitting: Family Medicine

## 2023-04-25 ENCOUNTER — Encounter: Payer: Self-pay | Admitting: Family Medicine

## 2023-04-25 VITALS — BP 120/82 | HR 96 | Temp 98.2°F | Ht 59.0 in | Wt 142.0 lb

## 2023-04-25 DIAGNOSIS — F32A Depression, unspecified: Secondary | ICD-10-CM | POA: Diagnosis not present

## 2023-04-25 DIAGNOSIS — H9190 Unspecified hearing loss, unspecified ear: Secondary | ICD-10-CM | POA: Diagnosis not present

## 2023-04-25 DIAGNOSIS — F419 Anxiety disorder, unspecified: Secondary | ICD-10-CM

## 2023-04-25 DIAGNOSIS — R296 Repeated falls: Secondary | ICD-10-CM | POA: Diagnosis not present

## 2023-04-25 NOTE — Assessment & Plan Note (Signed)
Frequent issues with falls.  PT has been beneficial.  She will continue this.

## 2023-04-25 NOTE — Assessment & Plan Note (Signed)
Chronic issue.  Has improved.  She will continue Wellbutrin 300 mg daily and Effexor 150 mg daily.

## 2023-04-25 NOTE — Progress Notes (Signed)
[name], MD Phone: [phone]  Ashley [name] is a 82 y.o. female who presents today for follow-up.  Anxiety/depression: Patient notes this has improved.  She feels as though the increased Wellbutrin dose was helpful.  She also notes she got saved through her church.  She continues on Effexor as well.  No SI.  Hearing difficulty: Patient notes this is a chronic issue.  She contacted ENT to see if she could use her husband's hearing aids that she has not heard back.  Falls: Patient reports she has been doing some physical therapy to help prevent this.  This has been beneficial.  Social History   Tobacco Use  Smoking Status Former   Current packs/day: 0.00   Average packs/day: 0.5 packs/day for 15.0 years (7.5 ttl pk-yrs)   Types: Cigarettes   Start date: 08/07/1961   Quit date: 08/07/1976   Years since quitting: 46.7  Smokeless Tobacco Never    Current Outpatient Medications on File Prior to Visit  Medication Sig Dispense Refill   acetaminophen (TYLENOL) 650 MG CR tablet Take 1,300 mg by mouth every 8 (eight) hours as needed for pain.     aspirin EC 81 MG tablet Take 81 mg by mouth daily. Swallow whole.     azelastine (OPTIVAR) 0.05 % ophthalmic solution Apply to eye.     buPROPion (WELLBUTRIN XL) 300 MG 24 hr tablet Take 1 tablet (300 mg total) by mouth daily. 90 tablet 1   cholecalciferol (VITAMIN D3) 25 MCG (1000 UNIT) tablet Take 1,000 Units by mouth daily.     Cranberry-Vitamin C-Probiotic (AZO CRANBERRY PO) Take by mouth.     cyanocobalamin (VITAMIN B12) 1000 MCG tablet Take 1,000 mcg by mouth daily.     EPINEPHrine (EPIPEN 2-PAK) 0.3 mg/0.3 mL IJ SOAJ injection Inject 0.3 mLs (0.3 mg total) into the muscle as needed for anaphylaxis. 1 each 1   fluticasone (FLONASE) 50 MCG/ACT nasal spray PLACE 1-2 SPRAYS IN EACH NOSTRIL ONCE DAILY 16 g 2   gabapentin (NEURONTIN) 100 MG capsule Take 2 capsules (200 mg total) by mouth at bedtime. 180 capsule 3   Magnesium 500 MG CAPS  Take by mouth.     Melatonin 10 MG TABS Take 10 mg by mouth at bedtime.     nitrofurantoin, macrocrystal-monohydrate, (MACROBID) 100 MG capsule Take 1 capsule (100 mg total) by mouth 2 (two) times daily. 14 capsule 0   omeprazole (PRILOSEC) 20 MG capsule TAKE 1 CAPSULE BY MOUTH ONCE DAILY 30 capsule 5   Polyethyl Glycol-Propyl Glycol (SYSTANE) 0.4-0.3 % SOLN Place 1 drop into both eyes 3 (three) times daily as needed (dry/irritated eyes.).     rosuvastatin (CRESTOR) 40 MG tablet TAKE 1 TABLET BY MOUTH ONCE DAILY 90 tablet 1   traMADol (ULTRAM) 50 MG tablet Take 25-50 mg by mouth 2 (two) times daily as needed.     TURMERIC PO Take by mouth.     venlafaxine XR (EFFEXOR-XR) 150 MG 24 hr capsule TAKE 1 CAPSULE BY MOUTH EVERY DAY 90 capsule 1   Vibegron (GEMTESA) 75 MG TABS Take 1 tablet (75 mg total) by mouth daily. 30 tablet 2   No current facility-administered medications on file prior to visit.     ROS see history of present illness  Objective  Physical Exam Vitals:   04/25/23 1359  BP: 120/82  Pulse: 96  Temp: 98.2 F (36.8 C)  SpO2: 95%    BP Readings from Last 3 Encounters:  04/25/23 120/82  03/02/23 118/76  02/13/23 118/80   Wt Readings from Last 3 Encounters:  04/25/23 142 lb (64.4 kg)  03/02/23 144 lb 3.7 oz (65.4 kg)  02/13/23 142 lb 9.6 oz (64.7 kg)    Physical Exam Constitutional:      General: She is not in acute distress.    Appearance: She is not diaphoretic.  Cardiovascular:     Rate and Rhythm: Normal rate and regular rhythm.     Heart sounds: Normal heart sounds.  Pulmonary:     Effort: Pulmonary effort is normal.     Breath sounds: Normal breath sounds.  Skin:    General: Skin is warm and dry.  Neurological:     Mental Status: She is alert.      Assessment/Plan: Please see individual problem list.  Anxiety and depression Assessment & Plan: Chronic issue.  Has improved.  She will continue Wellbutrin 300 mg daily and Effexor 150 mg  daily.   Frequent falls Assessment & Plan: Frequent issues with falls.  PT has been beneficial.  She will continue this.   Hearing difficulty, unspecified laterality Assessment & Plan: Chronic issue.  Offered referral to ENT for evaluation.  Discussed that her husband's hearing aids would likely need to be fit to her and frequency may need to be changed to help her hear better.  Patient declined ENT evaluation.      Return in about 3 months (around 07/25/2023).   [name], MD [facility] Primary Care [facility]

## 2023-04-25 NOTE — Assessment & Plan Note (Signed)
Chronic issue.  Offered referral to ENT for evaluation.  Discussed that her husband's hearing aids would likely need to be fit to her and frequency may need to be changed to help her hear better.  Patient declined ENT evaluation.

## 2023-04-30 ENCOUNTER — Other Ambulatory Visit: Payer: Self-pay | Admitting: Family Medicine

## 2023-05-19 ENCOUNTER — Other Ambulatory Visit: Payer: Self-pay | Admitting: Family Medicine

## 2023-06-06 ENCOUNTER — Ambulatory Visit: Payer: Medicare HMO | Admitting: Family Medicine

## 2023-06-06 ENCOUNTER — Encounter: Payer: Self-pay | Admitting: Family Medicine

## 2023-06-06 VITALS — BP 132/86 | HR 97 | Temp 98.4°F | Ht 59.0 in | Wt 140.0 lb

## 2023-06-06 DIAGNOSIS — R350 Frequency of micturition: Secondary | ICD-10-CM | POA: Diagnosis not present

## 2023-06-06 DIAGNOSIS — R21 Rash and other nonspecific skin eruption: Secondary | ICD-10-CM | POA: Diagnosis not present

## 2023-06-06 LAB — POC URINALSYSI DIPSTICK (AUTOMATED)
Bilirubin, UA: NEGATIVE
Glucose, UA: NEGATIVE
Ketones, UA: NEGATIVE
Nitrite, UA: NEGATIVE
Protein, UA: NEGATIVE
Spec Grav, UA: 1.02 (ref 1.010–1.025)
Urobilinogen, UA: 0.2 U/dL
pH, UA: 6 (ref 5.0–8.0)

## 2023-06-06 LAB — URINALYSIS, MICROSCOPIC ONLY

## 2023-06-06 MED ORDER — SULFAMETHOXAZOLE-TRIMETHOPRIM 800-160 MG PO TABS
1.0000 | ORAL_TABLET | Freq: Two times a day (BID) | ORAL | 0 refills | Status: DC
Start: 2023-06-06 — End: 2023-06-22

## 2023-06-06 NOTE — Assessment & Plan Note (Signed)
Likely related to UTI.  Will send urine for culture microscopy.  Will place patient on Bactrim to cover for UTI.  Discussed this would also cover the nodule if that is also an infection.

## 2023-06-06 NOTE — Progress Notes (Signed)
Ashley Alar, MD Phone: [phone]  Ashley [name] is a 82 y.o. female who presents today for same day visit.  UTI: going on one week. Dysuria- no  Frequency- yes   Urgency- yes   Hematuria- no    Abd pain- some suprapubic discomfort   Vaginal d/c- no  Labial lesion: Patient notes there appears to be a pimple or something similar on one of her labia.   Social History   Tobacco Use  Smoking Status Former   Current packs/day: 0.00   Average packs/day: 0.5 packs/day for 15.0 years (7.5 ttl pk-yrs)   Types: Cigarettes   Start date: 08/07/1961   Quit date: 08/07/1976   Years since quitting: 46.8  Smokeless Tobacco Never    Current Outpatient Medications on File Prior to Visit  Medication Sig Dispense Refill   acetaminophen (TYLENOL) 650 MG CR tablet Take 1,300 mg by mouth every 8 (eight) hours as needed for pain.     aspirin EC 81 MG tablet Take 81 mg by mouth daily. Swallow whole.     buPROPion (WELLBUTRIN XL) 300 MG 24 hr tablet Take 1 tablet (300 mg total) by mouth daily. 90 tablet 1   cholecalciferol (VITAMIN D3) 25 MCG (1000 UNIT) tablet Take 1,000 Units by mouth daily.     Cranberry-Vitamin C-Probiotic (AZO CRANBERRY PO) Take by mouth.     cyanocobalamin (VITAMIN B12) 1000 MCG tablet Take 1,000 mcg by mouth daily.     EPINEPHrine (EPIPEN 2-PAK) 0.3 mg/0.3 mL IJ SOAJ injection Inject 0.3 mLs (0.3 mg total) into the muscle as needed for anaphylaxis. 1 each 1   fluticasone (FLONASE) 50 MCG/ACT nasal spray PLACE 1-2 SPRAYS IN EACH NOSTRIL ONCE DAILY 16 g 2   gabapentin (NEURONTIN) 100 MG capsule Take 2 capsules (200 mg total) by mouth at bedtime. 180 capsule 3   Magnesium 500 MG CAPS Take by mouth.     Melatonin 10 MG TABS Take 10 mg by mouth at bedtime.     omeprazole (PRILOSEC) 20 MG capsule TAKE 1 CAPSULE BY MOUTH ONCE DAILY 30 capsule 5   Polyethyl Glycol-Propyl Glycol (SYSTANE) 0.4-0.3 % SOLN Place 1 drop into both eyes 3 (three) times daily as needed (dry/irritated  eyes.).     rosuvastatin (CRESTOR) 40 MG tablet TAKE 1 TABLET BY MOUTH ONCE DAILY 90 tablet 1   TURMERIC PO Take by mouth.     venlafaxine XR (EFFEXOR-XR) 150 MG 24 hr capsule TAKE 1 CAPSULE BY MOUTH ONCE DAILY 90 capsule 1   No current facility-administered medications on file prior to visit.     ROS see history of present illness  Objective  Physical Exam Vitals:   06/06/23 1037  BP: 132/86  Pulse: 97  Temp: 98.4 F (36.9 C)  SpO2: 98%    BP Readings from Last 3 Encounters:  06/06/23 132/86  04/25/23 120/82  03/02/23 118/76   Wt Readings from Last 3 Encounters:  06/06/23 140 lb (63.5 kg)  04/25/23 142 lb (64.4 kg)  03/02/23 144 lb 3.7 oz (65.4 kg)    Physical Exam Constitutional:      General: She is not in acute distress. Pulmonary:     Effort: Pulmonary effort is normal.  Abdominal:     General: Bowel sounds are normal. There is no distension.     Palpations: Abdomen is soft.  Genitourinary:      Comments: Prince Solian, CMA served as chaperone Neurological:     Mental Status: She is alert.  Assessment/Plan: Please see individual problem list.  Urine frequency Assessment & Plan: Likely related to UTI.  Will send urine for culture microscopy.  Will place patient on Bactrim to cover for UTI.  Discussed this would also cover the nodule if that is also an infection.  Orders: -     POCT Urinalysis Dipstick (Automated) -     POCT urinalysis dipstick -     Urine Culture -     Urine Microscopic -     Sulfamethoxazole-Trimethoprim; Take 1 tablet by mouth 2 (two) times daily.  Dispense: 14 tablet; Refill: 0  Rash of genital area Assessment & Plan: Patient with hypopigmented skin in her distal genital area, perineal area, and around her rectum.  Discussed that this could represent lichen sclerosis though could be related to some other cause.  Her lack of symptoms would seem to make lichen sclerosus a little less likely.  We will refer her to  dermatology and gynecology for further evaluation of this.  Orders: -     Ambulatory referral to Gynecology -     Ambulatory referral to Dermatology     Return for As scheduled.   Ashley Alar, MD [facility] Primary Care [facility]

## 2023-06-06 NOTE — Assessment & Plan Note (Signed)
Patient with hypopigmented skin in her distal genital area, perineal area, and around her rectum.  Discussed that this could represent lichen sclerosis though could be related to some other cause.  Her lack of symptoms would seem to make lichen sclerosus a little less likely.  We will refer her to dermatology and gynecology for further evaluation of this.

## 2023-06-06 NOTE — Patient Instructions (Signed)
Nice to see you. Gynecology and dermatology should contact you to schedule appointments. Will contact you with your urine culture results. Please start on the Bactrim.  This will cover for UTI and if the nodule is an infection.

## 2023-06-08 ENCOUNTER — Other Ambulatory Visit: Payer: Self-pay | Admitting: Family Medicine

## 2023-06-08 ENCOUNTER — Telehealth: Payer: Self-pay

## 2023-06-08 DIAGNOSIS — N3001 Acute cystitis with hematuria: Secondary | ICD-10-CM

## 2023-06-08 LAB — URINE CULTURE
MICRO NUMBER:: 15664051
SPECIMEN QUALITY:: ADEQUATE

## 2023-06-08 MED ORDER — CEFADROXIL 500 MG PO CAPS
500.0000 mg | ORAL_CAPSULE | Freq: Two times a day (BID) | ORAL | 0 refills | Status: DC
Start: 2023-06-08 — End: 2023-06-22

## 2023-06-08 NOTE — Telephone Encounter (Signed)
Left voicemail and my chart message for the Patient to call the office regarding the antibiotic. Please see results note.

## 2023-06-08 NOTE — Telephone Encounter (Signed)
Patient states she saw Dr. [name] for a UTI.  Patient states Dr. Birdie Sons called in sulfamethoxazole-trimethoprim (BACTRIM DS) 800-160 MG tablet for her to take.  Patient states she has been taking it.  Patient was sick on her stomach and called her pharmacy to see what they had to say about it.  Patient states her pharmacy told her that Dr. Birdie Sons had called in another antibiotic for her.  Patient states she would like to have some instruction on what she should be taking.

## 2023-06-11 NOTE — Telephone Encounter (Signed)
Patient just called. She would like to speak with Lanora Manis concerning the antibiotics. Her number is [phone]. Can you call her when you get a chance.

## 2023-06-11 NOTE — Telephone Encounter (Signed)
Left message for the Patient to call the office back

## 2023-06-12 NOTE — Telephone Encounter (Signed)
-----   Message from [name] sent at 06/08/2023 12:17 PM EDT ----- Please let the patient know that her urine culture did reveal an infection.  It is not sensitive to the antibiotic she is on.  I have sent a new antibiotic in for her to start on.  She will need repeat urinalysis in 6 weeks.  Orders placed.

## 2023-06-12 NOTE — Telephone Encounter (Signed)
Lvm for pt to call office back in regards to medications. Also senfding mychart msg.

## 2023-06-13 NOTE — Telephone Encounter (Signed)
Left message to return call to our office.  

## 2023-06-15 NOTE — Telephone Encounter (Signed)
Left message for the Patient to call the office back

## 2023-06-15 NOTE — Telephone Encounter (Signed)
Left message to return call to our office.  

## 2023-06-15 NOTE — Telephone Encounter (Signed)
Patient returned office phone call. 

## 2023-06-18 NOTE — Telephone Encounter (Signed)
   June 15, 2023 Ashley [name]  to Me      06/15/23  8:51 PM Already know . June 12, 2023 Me  to [name]      06/12/23 10:44 AM Good morning,   Our office has tried to reach you in regards to message about medications. Please give our office a call at [phone].   Thank you,   Loney Laurence, RMA   Last read by [name] at  8:49 PM on 06/15/2023.

## 2023-06-18 NOTE — Telephone Encounter (Signed)
Unsure if pt ever spoke to anyone. Pt responded to me stating already know.

## 2023-06-22 ENCOUNTER — Encounter: Payer: Self-pay | Admitting: Primary Care

## 2023-06-22 ENCOUNTER — Ambulatory Visit (INDEPENDENT_AMBULATORY_CARE_PROVIDER_SITE_OTHER): Payer: Medicare HMO | Admitting: Primary Care

## 2023-06-22 VITALS — BP 142/86 | HR 98 | Temp 97.2°F | Ht 59.0 in | Wt 140.0 lb

## 2023-06-22 DIAGNOSIS — R350 Frequency of micturition: Secondary | ICD-10-CM | POA: Diagnosis not present

## 2023-06-22 DIAGNOSIS — R42 Dizziness and giddiness: Secondary | ICD-10-CM | POA: Diagnosis not present

## 2023-06-22 LAB — POC URINALSYSI DIPSTICK (AUTOMATED)
Bilirubin, UA: NEGATIVE
Blood, UA: POSITIVE
Glucose, UA: NEGATIVE
Ketones, UA: NEGATIVE
Nitrite, UA: NEGATIVE
Protein, UA: NEGATIVE
Spec Grav, UA: 1.01 (ref 1.010–1.025)
Urobilinogen, UA: 0.2 U/dL
pH, UA: 7 (ref 5.0–8.0)

## 2023-06-22 MED ORDER — MECLIZINE HCL 12.5 MG PO TABS: 12.5000 mg | ORAL_TABLET | Freq: Three times a day (TID) | ORAL | 0 refills | Status: DC | PRN

## 2023-06-22 NOTE — Assessment & Plan Note (Signed)
Symptoms today representative. Neuro exam today reassuring. Orthostatic vital signs were negative.  Start meclizine 12.5 mg 3 times daily as needed.  Drowsiness precautions provided.  Handout provided today for Epley's maneuvers.  Follow-up with ENT.

## 2023-06-22 NOTE — Progress Notes (Signed)
Subjective:    Patient ID: Ashley [name], female    DOB: [birth year], 82 y.o.   MRN: [id number]  Dizziness Pertinent negatives include no abdominal pain, fever or headaches.  Urinary Frequency  Associated symptoms include frequency. Pertinent negatives include no flank pain or hematuria.    Ashley [name] is a very pleasant 82 y.o. female patient of Dr. Birdie Sons with a history of allergic rhinitis, GERD, hypertension, intraparenchymal hemorrhage of the brain, vertigo, imbalance, frequent falls, cystitis memory difficulty who presents today to discuss dizziness and urinary frequency.  Her daughter joins [place] today.  Symptom onset two evenings ago when getting out of bed. Her dizziness is worse when laying in bed or getting out of bed. She describes her dizziness as "room spinning" sensations. "The cat is moving around the room". She denies headaches, unilateral weakness, changes in speech. He follows with ENT for vertigo.   Diagnosed and treated for cystitis with Bactrim DS tablets on 06/06/2023, culture positive urinalysis with E. coli with panresistance.  Her antibiotic had to be changed to cefadroxil due to resistance. Today she discusses some improvement from prior UTI but without resolve in symptoms. Symptoms include bladder pressure and frequency. She follows with Urology, is taking cranberry pills and staying hydrated with water.  Has an appointment scheduled with urology for later this month.  She denies hematuria, dysuria, fevers, abdominal pain.   BP Readings from Last 3 Encounters:  06/22/23 (!) 142/86  06/06/23 132/86  04/25/23 120/82      Review of Systems  Constitutional:  Negative for fever.  Gastrointestinal:  Negative for abdominal pain.  Genitourinary:  Positive for frequency. Negative for dysuria, flank pain, hematuria and vaginal discharge.  Neurological:  Positive for dizziness. Negative for speech difficulty and headaches.  Psychiatric/Behavioral:  Negative  for confusion.          Past Medical History:  Diagnosis Date   Abnormal uterine bleeding (AUB)    a. benign endometrial polyps on D+C in 03/2017   Anxiety    Arthritis    Carpal tunnel syndrome of left wrist    Cerebral hemorrhage (HCC) 2023   Chicken pox    Depression    GERD (gastroesophageal reflux disease)    History of stress test    a. 08/2017 Lexiscan MV: EF 55-65%. No ischemia/infarct. Low risk.   Hyperlipidemia    Hypertension    Lacunar infarction (HCC)    a. Dx in setting of dizziness/vertigo; b. 02/2018 Carotid U/S: bilateral intimal thickening and atherosclerotic plaque. No hemodynamically significant stenoses; c. 03/2018 MRI Brain: No mass or abnl enhancement. Chronic microvascular ischemic changes and mod volume loss of the brain. 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   Stroke (HCC)    PT STATES SHE WAS TOLD THIS BY HER PCP BUT PT STATES SHE NEVER KNEW SHE HAD A STROKE-NO RESIDUAL EFFECTS   Vertigo     Social History   Socioeconomic History   Marital status: Widowed    Spouse name: [name]   Number of children: 1   Years of education: Not on file   Highest education level: Bachelor's degree (e.g., BA, AB, BS)  Occupational History   Not on file  Tobacco Use   Smoking status: Former    Current packs/day: 0.00    Average packs/day: 0.5 packs/day for 15.0 years (7.5 ttl pk-yrs)  Types: Cigarettes    Start date: 08/07/1961    Quit date: 08/07/1976    Years since quitting: 46.9   Smokeless tobacco: Never  Vaping Use   Vaping status: Never Used  Substance and Sexual Activity   Alcohol use: Yes    Alcohol/week: 1.0 standard drink of alcohol    Types: 1 Glasses of wine per week    Comment: ocassionally   Drug use: No   Sexual activity: Not Currently  Other Topics Concern   Not on file  Social History Narrative   03/07/22  Married    Social Determinants of Health   Financial Resource Strain: Low Risk  (12/18/2022)   Overall Financial Resource Strain (CARDIA)    Difficulty of Paying Living Expenses: Not hard at all  Food Insecurity: No Food Insecurity (12/18/2022)   Hunger Vital Sign    Worried About Running Out of Food in the Last Year: Never true    Ran Out of Food in the Last Year: Never true  Transportation Needs: No Transportation Needs (12/18/2022)   PRAPARE - Administrator, Civil Service (Medical): No    Lack of Transportation (Non-Medical): No  Physical Activity: Insufficiently Active (12/18/2022)   Exercise Vital Sign    Days of Exercise per Week: 7 days    Minutes of Exercise per Session: 20 min  Stress: No Stress Concern Present (12/18/2022)   [employer] of Occupational Health - Occupational Stress Questionnaire    Feeling of Stress : Not at all  Social Connections: Moderately Integrated (12/18/2022)   Social Connection and Isolation Panel [NHANES]    Frequency of Communication with Friends and Family: More than three times a week    Frequency of Social Gatherings with Friends and Family: More than three times a week    Attends Religious Services: More than 4 times per year    Active Member of [employer] or Organizations: Yes    Attends Banker Meetings: More than 4 times per year    Marital Status: Widowed  Intimate Partner Violence: Not At Risk (12/18/2022)   Humiliation, Afraid, Rape, and Kick questionnaire    Fear of Current or Ex-Partner: No    Emotionally Abused: No    Physically Abused: No    Sexually Abused: No    Past Surgical History:  Procedure Laterality Date   BACK SURGERY  11/19/2017   lumbar laminectomy   CARPAL TUNNEL RELEASE Right 07/30/2019   CARPAL TUNNEL RELEASE Left 02/21/2021   Procedure: CARPAL TUNNEL RELEASE;  Surgeon: Donato Heinz, MD;  Location: [facility] ORS;  Service: Orthopedics;  Laterality: Left;   COLONOSCOPY     2003, 2011, 2021    COLONOSCOPY WITH PROPOFOL N/A 12/24/2019   Procedure: COLONOSCOPY WITH PROPOFOL;  Surgeon: Toledo, Boykin Nearing, MD;  Location: [facility];  Service: Gastroenterology;  Laterality: N/A;   CYSTOSCOPY MACROPLASTIQUE IMPLANT N/A 05/15/2019   Procedure: CYSTOSCOPY MACROPLASTIQUE IMPLANT;  Surgeon: Orson Ape, MD;  Location: [facility] OR[facility];  Service: Urology;  Laterality: N/A;   DILATATION & CURETTAGE/HYSTEROSCOPY WITH MYOSURE N/A 03/30/2017   Procedure: DILATATION & CURETTAGE/HYSTEROSCOPY WITH MYOSURE;  Surgeon: Schermerhorn, Ihor Austin, MD;  Location: [facility] OR[facility];  Service: Gynecology;  Laterality: N/A;   EYE SURGERY Bilateral    Cataract Extraction with IOL   JOINT REPLACEMENT     KNEE ARTHROPLASTY Left 12/01/2020   Procedure: COMPUTER ASSISTED TOTAL KNEE ARTHROPLASTY;  Surgeon: Donato Heinz, MD;  Location: [facility] OR[facility];  Service: Orthopedics;  Laterality: Left;  SKIN LESION EXCISION Right    synovial cyst right AC joint   TONSILLECTOMY AND ADENOIDECTOMY     Age 89    Family History  Problem Relation Age of Onset   Rheum arthritis Mother    Ovarian cancer Sister    COPD Sister    CAD Sister    Heart disease Father    Aneurysm Father    Cancer Brother    Thrombosis Maternal Grandmother        a. DVT   Thrombosis Maternal Grandfather        a. DVT   Thrombosis Paternal Grandmother        a. DVT   Thrombosis Paternal Grandfather        a. DVT    Allergies  Allergen Reactions   Ace Inhibitors Other (See Comments)    Angioedema   Pravastatin Other (See Comments)    Other reaction(s): Muscle Pain   Simvastatin Other (See Comments)    stiffness    Current Outpatient Medications on File Prior to Visit  Medication Sig Dispense Refill   acetaminophen (TYLENOL) 650 MG CR tablet Take 1,300 mg by mouth every 8 (eight) hours as needed for pain.     aspirin EC 81 MG tablet Take 81 mg by mouth daily. Swallow whole.     buPROPion (WELLBUTRIN XL) 300 MG 24 hr tablet Take 1 tablet (300 mg  total) by mouth daily. 90 tablet 1   cefadroxil (DURICEF) 500 MG capsule Take 1 capsule (500 mg total) by mouth 2 (two) times daily. 14 capsule 0   cholecalciferol (VITAMIN D3) 25 MCG (1000 UNIT) tablet Take 1,000 Units by mouth daily.     Cranberry-Vitamin C-Probiotic (AZO CRANBERRY PO) Take by mouth.     cyanocobalamin (VITAMIN B12) 1000 MCG tablet Take 1,000 mcg by mouth daily.     EPINEPHrine (EPIPEN 2-PAK) 0.3 mg/0.3 mL IJ SOAJ injection Inject 0.3 mLs (0.3 mg total) into the muscle as needed for anaphylaxis. 1 each 1   fluticasone (FLONASE) 50 MCG/ACT nasal spray PLACE 1-2 SPRAYS IN EACH NOSTRIL ONCE DAILY 16 g 2   gabapentin (NEURONTIN) 100 MG capsule Take 2 capsules (200 mg total) by mouth at bedtime. 180 capsule 3   Magnesium 500 MG CAPS Take by mouth.     Melatonin 10 MG TABS Take 10 mg by mouth at bedtime.     omeprazole (PRILOSEC) 20 MG capsule TAKE 1 CAPSULE BY MOUTH ONCE DAILY 30 capsule 5   Polyethyl Glycol-Propyl Glycol (SYSTANE) 0.4-0.3 % SOLN Place 1 drop into both eyes 3 (three) times daily as needed (dry/irritated eyes.).     rosuvastatin (CRESTOR) 40 MG tablet TAKE 1 TABLET BY MOUTH ONCE DAILY 90 tablet 1   sulfamethoxazole-trimethoprim (BACTRIM DS) 800-160 MG tablet Take 1 tablet by mouth 2 (two) times daily. 14 tablet 0   TURMERIC PO Take by mouth.     venlafaxine XR (EFFEXOR-XR) 150 MG 24 hr capsule TAKE 1 CAPSULE BY MOUTH ONCE DAILY 90 capsule 1   No current facility-administered medications on file prior to visit.    There were no vitals taken for this visit. Objective:   Physical Exam Cardiovascular:     Rate and Rhythm: Normal rate and regular rhythm.  Pulmonary:     Effort: Pulmonary effort is normal.     Breath sounds: Normal breath sounds.  Musculoskeletal:     Cervical back: Neck supple.  Skin:    General: Skin is warm and dry.  Neurological:  Mental Status: She is alert and oriented to person, place, and time.  Psychiatric:        Mood and  Affect: Mood normal.           Assessment & Plan:  There are no diagnoses linked to this encounter.      Doreene Nest, NP

## 2023-06-22 NOTE — Assessment & Plan Note (Signed)
UA today with 1+ leuks, trace blood. Culture sent.  Await results.  Follow-up with urology as scheduled.

## 2023-06-22 NOTE — Patient Instructions (Signed)
You may take meclizine 12.5 mg tablets every 8 hours as needed for vertigo.  This may cause drowsiness.  Try the Epley's maneuvers.  We will be in touch once we have your urine test results.  It was a pleasure to see you today!

## 2023-06-24 ENCOUNTER — Other Ambulatory Visit: Payer: Self-pay | Admitting: Primary Care

## 2023-06-24 DIAGNOSIS — N3001 Acute cystitis with hematuria: Secondary | ICD-10-CM

## 2023-06-24 LAB — URINE CULTURE
MICRO NUMBER:: 15736991
SPECIMEN QUALITY:: ADEQUATE

## 2023-06-24 MED ORDER — NITROFURANTOIN MONOHYD MACRO 100 MG PO CAPS: 100.0000 mg | ORAL_CAPSULE | Freq: Two times a day (BID) | ORAL | 0 refills | Status: AC

## 2023-06-25 ENCOUNTER — Telehealth: Payer: Self-pay | Admitting: Family Medicine

## 2023-06-25 DIAGNOSIS — R42 Dizziness and giddiness: Secondary | ICD-10-CM

## 2023-06-25 NOTE — Telephone Encounter (Signed)
I will forward to the provider that saw her for the vertigo to determine the next step in management.

## 2023-06-25 NOTE — Telephone Encounter (Signed)
Please call patient:  She mentioned that she was already seeing ENT. Who does she see? Or has it been a while?

## 2023-06-25 NOTE — Telephone Encounter (Signed)
Pt called stating she need a referral for her vertigo

## 2023-06-26 NOTE — Telephone Encounter (Signed)
Patient just called and wants to know if her provider can send in a referral to see someone in ENT. Ear, nose and throat in [place]. Her number is [phone]. If someone can call her and let her know when the referral is ready.

## 2023-06-26 NOTE — Telephone Encounter (Signed)
Patient called and stated she can not get into seeing her ENT. She goes to [facility] and sees anyone there.

## 2023-06-26 NOTE — Telephone Encounter (Signed)
Referral placed to [facility]. Please confirm what symptoms she is still having. Thanks.

## 2023-06-26 NOTE — Addendum Note (Signed)
Addended by: Glori Luis on: 06/26/2023 06:24 PM   Modules accepted: Orders

## 2023-06-27 ENCOUNTER — Telehealth: Payer: Self-pay | Admitting: Family Medicine

## 2023-06-27 NOTE — Telephone Encounter (Signed)
She is aware and agreeable to let us know if she does not hear from ENT.

## 2023-06-27 NOTE — Telephone Encounter (Signed)
Left message to call the Patient back

## 2023-06-27 NOTE — Telephone Encounter (Signed)
When Patient wakes up and moves slightly her head she twirls. Patient's head feels full and very dizzy. Patient is using her walker so she does not fall. Patient states this is only in the mornings and in the middle of the night. Patient states it feels just like vertigo because she has had vertigo before. Patient is taking Meclizine and it starts to clear the issue up but then the issue comes right back.

## 2023-06-27 NOTE — Telephone Encounter (Signed)
Noted. If she does not hear from ENT in the next week or so she needs to let us know.

## 2023-06-27 NOTE — Telephone Encounter (Signed)
It is fine for her to take antibiotics that close together.  She needs to have her UTI treated so she has to take the antibiotics for that.

## 2023-06-27 NOTE — Telephone Encounter (Signed)
Patient called regarding recent visit with Jae Dire. States she was seen for vertigo and a UTI. Patient says her urine test was positive and she was given abx to clear up the infection. Patient says she was seen not long ago on 10/30 for the same concern, and also showed infection and was given abx for it. She would likie to know if it is safe for her to take antibiotics back to back like this? Please advise

## 2023-07-02 NOTE — Telephone Encounter (Signed)
Left message to call the office back.

## 2023-07-03 ENCOUNTER — Other Ambulatory Visit: Payer: Medicare HMO | Admitting: Urology

## 2023-07-10 ENCOUNTER — Other Ambulatory Visit: Payer: Medicare HMO | Admitting: Urology

## 2023-07-12 NOTE — Telephone Encounter (Signed)
Several attempts have been made to reach out to pt.  Per chart antibiotics have been completed.  Closing message.

## 2023-07-25 ENCOUNTER — Ambulatory Visit: Payer: Medicare HMO | Admitting: Family Medicine

## 2023-08-02 ENCOUNTER — Other Ambulatory Visit: Payer: Self-pay | Admitting: Family Medicine

## 2023-08-06 ENCOUNTER — Ambulatory Visit (INDEPENDENT_AMBULATORY_CARE_PROVIDER_SITE_OTHER): Payer: Medicare HMO | Admitting: Family Medicine

## 2023-08-06 ENCOUNTER — Encounter: Payer: Self-pay | Admitting: Family Medicine

## 2023-08-06 ENCOUNTER — Telehealth: Payer: Self-pay

## 2023-08-06 VITALS — BP 126/84 | HR 100 | Temp 98.6°F | Ht 59.0 in | Wt 140.4 lb

## 2023-08-06 DIAGNOSIS — Z23 Encounter for immunization: Secondary | ICD-10-CM

## 2023-08-06 DIAGNOSIS — F32A Depression, unspecified: Secondary | ICD-10-CM | POA: Diagnosis not present

## 2023-08-06 DIAGNOSIS — R42 Dizziness and giddiness: Secondary | ICD-10-CM

## 2023-08-06 DIAGNOSIS — R350 Frequency of micturition: Secondary | ICD-10-CM

## 2023-08-06 DIAGNOSIS — F419 Anxiety disorder, unspecified: Secondary | ICD-10-CM | POA: Diagnosis not present

## 2023-08-06 LAB — POCT URINALYSIS DIPSTICK
Bilirubin, UA: NEGATIVE
Glucose, UA: NEGATIVE
Ketones, UA: NEGATIVE
Nitrite, UA: POSITIVE
Protein, UA: NEGATIVE
Spec Grav, UA: 1.02 (ref 1.010–1.025)
Urobilinogen, UA: 0.2 U/dL
pH, UA: 6 (ref 5.0–8.0)

## 2023-08-06 LAB — URINALYSIS, MICROSCOPIC ONLY: RBC / HPF: NONE SEEN (ref 0–?)

## 2023-08-06 MED ORDER — NITROFURANTOIN MONOHYD MACRO 100 MG PO CAPS: 100.0000 mg | ORAL_CAPSULE | Freq: Two times a day (BID) | ORAL | 0 refills | Status: DC

## 2023-08-06 NOTE — Assessment & Plan Note (Signed)
Chronic issue.  Generally stable and adequately controlled.  She will continue Wellbutrin 300 mg daily and Effexor 150 mg daily.

## 2023-08-06 NOTE — Progress Notes (Signed)
Ashley Alar, MD Phone: [phone]  Ashley [name] is a 82 y.o. female who presents today for follow-up.  Anxiety/depression: Patient notes this is generally okay.  Notes her symptoms come and go.  She is started going to church and has been helpful.  She is on Wellbutrin and Effexor.  No SI.  Vertigo: Patient notes this resolved.  Recurrent UTI: Patient reports continued tingling and dysuria.  Notes this occurs occasionally.  She will have increased frequency with this.  She was previously treated with Bactrim.  She is seeing urology in the near future.  Social History   Tobacco Use  Smoking Status Former   Current packs/day: 0.00   Average packs/day: 0.5 packs/day for 15.0 years (7.5 ttl pk-yrs)   Types: Cigarettes   Start date: 08/07/1961   Quit date: 08/07/1976   Years since quitting: 47.0  Smokeless Tobacco Never    Current Outpatient Medications on File Prior to Visit  Medication Sig Dispense Refill   acetaminophen (TYLENOL) 650 MG CR tablet Take 1,300 mg by mouth every 8 (eight) hours as needed for pain.     aspirin EC 81 MG tablet Take 81 mg by mouth daily. Swallow whole.     buPROPion (WELLBUTRIN XL) 300 MG 24 hr tablet Take 1 tablet (300 mg total) by mouth daily. 90 tablet 1   cholecalciferol (VITAMIN D3) 25 MCG (1000 UNIT) tablet Take 1,000 Units by mouth daily.     Cranberry-Vitamin C-Probiotic (AZO CRANBERRY PO) Take by mouth.     cyanocobalamin (VITAMIN B12) 1000 MCG tablet Take 1,000 mcg by mouth daily.     EPINEPHrine (EPIPEN 2-PAK) 0.3 mg/0.3 mL IJ SOAJ injection Inject 0.3 mLs (0.3 mg total) into the muscle as needed for anaphylaxis. 1 each 1   fluticasone (FLONASE) 50 MCG/ACT nasal spray PLACE 1-2 SPRAYS IN EACH NOSTRIL ONCE DAILY 16 g 2   gabapentin (NEURONTIN) 100 MG capsule Take 2 capsules (200 mg total) by mouth at bedtime. 180 capsule 3   Magnesium 500 MG CAPS Take by mouth.     meclizine (ANTIVERT) 12.5 MG tablet Take 1 tablet (12.5 mg total) by mouth  3 (three) times daily as needed for dizziness. 15 tablet 0   Melatonin 10 MG TABS Take 10 mg by mouth at bedtime.     omeprazole (PRILOSEC) 20 MG capsule TAKE 1 CAPSULE BY MOUTH ONCE DAILY 30 capsule 5   Polyethyl Glycol-Propyl Glycol (SYSTANE) 0.4-0.3 % SOLN Place 1 drop into both eyes 3 (three) times daily as needed (dry/irritated eyes.).     rosuvastatin (CRESTOR) 40 MG tablet TAKE 1 TABLET BY MOUTH ONCE DAILY 90 tablet 1   TURMERIC PO Take by mouth.     venlafaxine XR (EFFEXOR-XR) 150 MG 24 hr capsule TAKE 1 CAPSULE BY MOUTH ONCE DAILY 90 capsule 1   No current facility-administered medications on file prior to visit.     ROS see history of present illness  Objective  Physical Exam Vitals:   08/06/23 1012  BP: 126/84  Pulse: 100  Temp: 98.6 F (37 C)  SpO2: 96%    BP Readings from Last 3 Encounters:  08/06/23 126/84  06/22/23 (!) 142/86  06/06/23 132/86   Wt Readings from Last 3 Encounters:  08/06/23 140 lb 6.4 oz (63.7 kg)  06/22/23 140 lb (63.5 kg)  06/06/23 140 lb (63.5 kg)    Physical Exam Constitutional:      General: She is not in acute distress.    Appearance: She is not  diaphoretic.  Cardiovascular:     Rate and Rhythm: Normal rate and regular rhythm.     Heart sounds: Normal heart sounds.  Pulmonary:     Effort: Pulmonary effort is normal.     Breath sounds: Normal breath sounds.  Musculoskeletal:     Right lower leg: No edema.     Left lower leg: No edema.  Skin:    General: Skin is warm and dry.  Neurological:     Mental Status: She is alert.      Assessment/Plan: Please see individual problem list.  Anxiety and depression Assessment & Plan: Chronic issue.  Generally stable and adequately controlled.  She will continue Wellbutrin 300 mg daily and Effexor 150 mg daily.   Vertigo Assessment & Plan: Chronic intermittent issue.  Has resolved.  She will monitor.   Urine frequency Assessment & Plan: Chronic intermittent issue.  She will  see urology as planned.  Urinalysis does have signs of infection.  Will treat with Macrobid.  Sending urine for culture microscopy.  Orders: -     POCT urinalysis dipstick -     Nitrofurantoin Monohyd Macro; Take 1 capsule (100 mg total) by mouth 2 (two) times daily.  Dispense: 14 capsule; Refill: 0 -     Urine Microscopic -     Urine Culture  Encounter for administration of vaccine -     Flu Vaccine Trivalent High Dose (Fluad)     Return in about 3 months (around 11/04/2023) for transfer of care.   Ashley Alar, MD [facility]

## 2023-08-06 NOTE — Telephone Encounter (Signed)
-----   Message from [name] sent at 08/06/2023 11:06 AM EST ----- Please let the patient know that her urine does appear infected.  I sent in Macrobid for her to start on.  We will send urine for culture microscopy as well and we will contact her with those results.

## 2023-08-06 NOTE — Telephone Encounter (Signed)
Left message to call the office back regarding the lab result below.

## 2023-08-06 NOTE — Telephone Encounter (Signed)
The message from the Doctor has been given to the pt. Thanks.

## 2023-08-06 NOTE — Assessment & Plan Note (Addendum)
Chronic intermittent issue.  She will see urology as planned.  Urinalysis does have signs of infection.  Will treat with Macrobid.  Sending urine for culture microscopy.

## 2023-08-06 NOTE — Assessment & Plan Note (Signed)
Chronic intermittent issue.  Has resolved.  She will monitor.

## 2023-08-08 LAB — URINE CULTURE
MICRO NUMBER:: 15900106
SPECIMEN QUALITY:: ADEQUATE

## 2023-08-14 NOTE — Telephone Encounter (Signed)
 Detailed VM left informing pt of urine analysis results and to cb in regards to how she is feeling as of today

## 2023-08-17 ENCOUNTER — Telehealth: Payer: Self-pay

## 2023-08-17 NOTE — Telephone Encounter (Signed)
 Copied from CRM [id number]. Topic: Clinical - Medication Question >> Aug 17, 2023 11:37 AM Deaijah H wrote: Reason for CRM: Patient called in wanting to know if Dr. Maribeth sent in medication for UTI / I do not see anything listed regarding so / please call patient to give update regarding prescription / call back # [phone]

## 2023-08-17 NOTE — Telephone Encounter (Signed)
 Detailed VM left informing that during her visit on 08/06/23 Ashley [name] did prescribe [facility]

## 2023-08-22 ENCOUNTER — Telehealth: Payer: Self-pay

## 2023-08-22 NOTE — Telephone Encounter (Signed)
 Copied from CRM [id number]. Topic: Clinical - Medical Advice >> Aug 22, 2023 12:51 PM Suzette B wrote: Reason for CRM: Patient has called stating she is still dealing with painful urination after taking the macrobid  and needed to know if there's anything else she can do. Please call patient at [phone]

## 2023-08-23 NOTE — Telephone Encounter (Signed)
She should have her urine rechecked. Please confirm if her symptoms got any better on the antibiotic.

## 2023-08-23 NOTE — Telephone Encounter (Signed)
Detailed vm left asking pt to cb

## 2023-08-27 NOTE — Telephone Encounter (Signed)
LMTCB

## 2023-08-28 ENCOUNTER — Other Ambulatory Visit: Payer: Medicare HMO | Admitting: Urology

## 2023-08-28 ENCOUNTER — Telehealth: Payer: Self-pay | Admitting: Family Medicine

## 2023-08-28 NOTE — Telephone Encounter (Signed)
Patient is scheduled to see Dr. Birdie Sons on 08/29/23.

## 2023-08-28 NOTE — Telephone Encounter (Signed)
Patient was treated and placed on macrobid at the time of her visit on 08/06/23. There is a mychart encounter from 08/09/23 that notes her symptoms resolved and she was taking the medication. She should be seen for this as it would seem to be a recurrence of symptoms.

## 2023-08-28 NOTE — Telephone Encounter (Signed)
Copied from CRM [id number]. Topic: Clinical - Prescription Issue >> Aug 28, 2023  2:52 PM Leavy Cella D wrote: Reason for CRM: Patient stated she had a positive uti culture on 08/06/23 but never received medication for it . Patient would like a call back regarding medication

## 2023-08-28 NOTE — Telephone Encounter (Signed)
 See other encounter.

## 2023-08-29 ENCOUNTER — Ambulatory Visit: Payer: Medicare HMO | Admitting: Family Medicine

## 2023-09-04 ENCOUNTER — Ambulatory Visit (INDEPENDENT_AMBULATORY_CARE_PROVIDER_SITE_OTHER): Payer: Medicare HMO | Admitting: Urology

## 2023-09-04 ENCOUNTER — Ambulatory Visit: Payer: Medicare HMO | Admitting: Urology

## 2023-09-04 ENCOUNTER — Encounter: Payer: Self-pay | Admitting: Urology

## 2023-09-04 VITALS — BP 159/105 | HR 103 | Ht 59.0 in | Wt 139.0 lb

## 2023-09-04 DIAGNOSIS — N3289 Other specified disorders of bladder: Secondary | ICD-10-CM

## 2023-09-04 DIAGNOSIS — N393 Stress incontinence (female) (male): Secondary | ICD-10-CM

## 2023-09-04 DIAGNOSIS — R8290 Unspecified abnormal findings in urine: Secondary | ICD-10-CM

## 2023-09-04 DIAGNOSIS — N390 Urinary tract infection, site not specified: Secondary | ICD-10-CM | POA: Diagnosis not present

## 2023-09-04 DIAGNOSIS — N952 Postmenopausal atrophic vaginitis: Secondary | ICD-10-CM

## 2023-09-04 LAB — URINALYSIS, COMPLETE
Bilirubin, UA: NEGATIVE
Glucose, UA: NEGATIVE
Ketones, UA: NEGATIVE
Nitrite, UA: POSITIVE — AB
Protein,UA: NEGATIVE
Specific Gravity, UA: >1.03 — ABNORMAL HIGH (ref 1.005–1.030)
Urobilinogen, Ur: 1 mg/dL (ref 0.2–1.0)
pH, UA: 6 (ref 5.0–7.5)

## 2023-09-04 LAB — MICROSCOPIC EXAMINATION

## 2023-09-04 MED ORDER — ESTRADIOL 0.1 MG/GM VA CREA: TOPICAL_CREAM | VAGINAL | 12 refills | Status: AC

## 2023-09-04 NOTE — Progress Notes (Signed)
Urinalysis frankly positive today and symptomatic.  See office note.  Reschedule cystoscopy.  Vanna Scotland, MD

## 2023-09-04 NOTE — Progress Notes (Signed)
I,Amy L Pierron,acting as a scribe for Vanna Scotland, MD.,have documented all relevant documentation on the behalf of Vanna Scotland, MD,as directed by  Vanna Scotland, MD while in the presence of Vanna Scotland, MD.  09/04/2023 7:01 PM   Ashley [name] 05-24-1941 [id number]  Referring provider: Glori Luis, MD [address] [place],  [place] [address]  Chief Complaint  Patient presents with   Follow-up    HPI: 83 year-old female presents today for a follow-up.  She was first seen and evaluated in November of 2023 after transferring from Dr. Amada Jupiter office. She has a personal history of coaptide injection near the bladder neck, which was read previously as a bladder mass by the radiologist. She underwent cystoscopy that did show a heaped up submucosal nodule with mass effect on the bladder neck with a small sulcus with erosion and a scant amount of visible Coaptite. She was managed conservatively and we planned to re-scope that area for surveillance.   In the meantime, she's contained to have recurrent urinary tract infection. She was treated in the end of December by her PCP for an E. coli urinary tract infection.   Urinalysis today was frankly positive including 11 to 30 white blood cells, 3 to 10 red blood cells, many bacteria, nitrate positive.   She is accompanied today by her daughter.   She mentions today that she's having some lower abdominal pressure and frequency. She's not sure if it's an infection. She's not currently on estrogen cream, but is taking probiotics, cranberry tablets, and D-Mannose.   Results for orders placed or performed in visit on 09/04/23  Microscopic Examination   Urine  Result Value Ref Range   WBC, UA 11-30 (A) 0 - 5 /hpf   RBC, Urine 3-10 (A) 0 - 2 /hpf   Epithelial Cells (non renal) 0-10 0 - 10 /hpf   Bacteria, UA Many (A) None seen/Few  Urinalysis, Complete  Result Value Ref Range   Specific Gravity, UA >1.030 (H) 1.005  - 1.030   pH, UA 6.0 5.0 - 7.5   Color, UA Yellow Yellow   Appearance Ur Hazy (A) Clear   Leukocytes,UA 1+ (A) Negative   Protein,UA Negative Negative/Trace   Glucose, UA Negative Negative   Ketones, UA Negative Negative   RBC, UA Trace (A) Negative   Bilirubin, UA Negative Negative   Urobilinogen, Ur 1.0 0.2 - 1.0 mg/dL   Nitrite, UA Positive (A) Negative   Microscopic Examination See below:      PMH: Past Medical History:  Diagnosis Date   Abnormal uterine bleeding (AUB)    a. benign endometrial polyps on D+C in 03/2017   Anxiety    Arthritis    Carpal tunnel syndrome of left wrist    Cerebral hemorrhage (HCC) 2023   Chicken pox    Depression    GERD (gastroesophageal reflux disease)    History of stress test    a. 08/2017 Lexiscan MV: EF 55-65%. No ischemia/infarct. Low risk.   Hyperlipidemia    Hypertension    Lacunar infarction (HCC)    a. Dx in setting of dizziness/vertigo; b. 02/2018 Carotid U/S: bilateral intimal thickening and atherosclerotic plaque. No hemodynamically significant stenoses; c. 03/2018 MRI Brain: No mass or abnl enhancement. Chronic microvascular ischemic changes and mod volume loss of the brain. 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   Stroke (HCC)    PT STATES SHE WAS TOLD THIS BY HER PCP BUT PT STATES SHE NEVER KNEW SHE HAD A STROKE-NO RESIDUAL EFFECTS   Vertigo     Surgical History: Past Surgical History:  Procedure Laterality Date   BACK SURGERY  11/19/2017   lumbar laminectomy   CARPAL TUNNEL RELEASE Right 07/30/2019   CARPAL TUNNEL RELEASE Left 02/21/2021   Procedure: CARPAL TUNNEL RELEASE;  Surgeon: Donato Heinz, MD;  Location: [facility];  Service: Orthopedics;  Laterality: Left;   COLONOSCOPY     2003, 2011, 2021   COLONOSCOPY WITH PROPOFOL N/A 12/24/2019   Procedure: COLONOSCOPY WITH  PROPOFOL;  Surgeon: Toledo, Boykin Nearing, MD;  Location: [facility];  Service: Gastroenterology;  Laterality: N/A;   CYSTOSCOPY MACROPLASTIQUE IMPLANT N/A 05/15/2019   Procedure: CYSTOSCOPY MACROPLASTIQUE IMPLANT;  Surgeon: Orson Ape, MD;  Location: [facility];  Service: Urology;  Laterality: N/A;   DILATATION & CURETTAGE/HYSTEROSCOPY WITH MYOSURE N/A 03/30/2017   Procedure: DILATATION & CURETTAGE/HYSTEROSCOPY WITH MYOSURE;  Surgeon: Schermerhorn, Ihor Austin, MD;  Location: [facility] OR[facility];  Service: Gynecology;  Laterality: N/A;   EYE SURGERY Bilateral    Cataract Extraction with IOL   JOINT REPLACEMENT     KNEE ARTHROPLASTY Left 12/01/2020   Procedure: COMPUTER ASSISTED TOTAL KNEE ARTHROPLASTY;  Surgeon: Donato Heinz, MD;  Location: [facility];  Service: Orthopedics;  Laterality: Left;   SKIN LESION EXCISION Right    synovial cyst right AC joint   TONSILLECTOMY AND ADENOIDECTOMY     Age 72    Home Medications:  Allergies as of 09/04/2023       Reactions   Ace Inhibitors Other (See Comments)   Angioedema   Pravastatin Other (See Comments)   Other reaction(s): Muscle Pain   Simvastatin Other (See Comments)   stiffness        Medication List        Accurate as of September 04, 2023  7:01 PM. If you have any questions, ask your nurse or doctor.          STOP taking these medications    meclizine 12.5 MG tablet Commonly known as: ANTIVERT Stopped by: Vanna Scotland   nitrofurantoin (macrocrystal-monohydrate) 100 MG capsule Commonly known as: Macrobid Stopped by: Vanna Scotland       TAKE these medications    acetaminophen 650 MG CR tablet Commonly known as: TYLENOL Take 1,300 mg by mouth every 8 (eight) hours as needed for pain.   aspirin EC 81 MG tablet Take 81 mg by mouth daily. Swallow whole.   AZO CRANBERRY PO Take by mouth.   buPROPion 300 MG 24 hr tablet Commonly known as: Wellbutrin XL Take 1 tablet (300 mg total) by mouth daily.   cholecalciferol  25 MCG (1000 UNIT) tablet Commonly known as: VITAMIN D3 Take 1,000 Units by mouth daily.   cyanocobalamin 1000 MCG tablet Commonly known as: VITAMIN B12 Take 1,000 mcg by mouth daily.   EPINEPHrine 0.3 mg/0.3 mL Soaj injection Commonly known as: EpiPen 2-Pak Inject 0.3 mLs (0.3 mg total) into the muscle as needed for anaphylaxis.   estradiol 0.1 MG/GM vaginal cream Commonly known as: ESTRACE Estrogen Cream Instruction Discard applicator Apply pea sized amount to tip of finger to urethra before bed. Wash hands well after application. Use Monday, Wednesday and Friday Started by: Vanna Scotland   fluticasone 50 MCG/ACT nasal spray Commonly known as: FLONASE PLACE 1-2 SPRAYS IN EACH NOSTRIL ONCE DAILY  gabapentin 100 MG capsule Commonly known as: NEURONTIN Take 2 capsules (200 mg total) by mouth at bedtime.   Magnesium 500 MG Caps Take by mouth.   Melatonin 10 MG Tabs Take 10 mg by mouth at bedtime.   omeprazole 20 MG capsule Commonly known as: PRILOSEC TAKE 1 CAPSULE BY MOUTH ONCE DAILY   rosuvastatin 40 MG tablet Commonly known as: CRESTOR TAKE 1 TABLET BY MOUTH ONCE DAILY   Systane 0.4-0.3 % Soln Generic drug: Polyethyl Glycol-Propyl Glycol Place 1 drop into both eyes 3 (three) times daily as needed (dry/irritated eyes.).   TURMERIC PO Take by mouth.   venlafaxine XR 150 MG 24 hr capsule Commonly known as: EFFEXOR-XR TAKE 1 CAPSULE BY MOUTH ONCE DAILY        Allergies:  Allergies  Allergen Reactions   Ace Inhibitors Other (See Comments)    Angioedema   Pravastatin Other (See Comments)    Other reaction(s): Muscle Pain   Simvastatin Other (See Comments)    stiffness    Family History: Family History  Problem Relation Age of Onset   Rheum arthritis Mother    Ovarian cancer Sister    COPD Sister    CAD Sister    Heart disease Father    Aneurysm Father    Cancer Brother    Thrombosis Maternal Grandmother        a. DVT   Thrombosis Maternal  Grandfather        a. DVT   Thrombosis Paternal Grandmother        a. DVT   Thrombosis Paternal Grandfather        a. DVT    Social History:  reports that she quit smoking about 47 years ago. Her smoking use included cigarettes. She started smoking about 62 years ago. She has a 7.5 pack-year smoking history. She has never used smokeless tobacco. She reports current alcohol use of about 1.0 standard drink of alcohol per week. She reports that she does not use drugs.   Physical Exam: There were no vitals taken for this visit.  Constitutional:  Alert and oriented, No acute distress. HEENT: [place] AT, moist mucus membranes.  Trachea midline, no masses. Neurologic: Grossly intact, no focal deficits, moving all 4 extremities. Psychiatric: Normal mood and affect.   Urinalysis    Component Value Date/Time   COLORURINE YELLOW (A) 09/28/2022 2222   APPEARANCEUR Hazy (A) 09/04/2023 1525   LABSPEC 1.009 09/28/2022 2222   PHURINE 6.0 09/28/2022 2222   GLUCOSEU Negative 09/04/2023 1525   HGBUR SMALL (A) 09/28/2022 2222   BILIRUBINUR Negative 09/04/2023 1525   KETONESUR NEGATIVE 09/28/2022 2222   PROTEINUR Negative 09/04/2023 1525   PROTEINUR NEGATIVE 09/28/2022 2222   UROBILINOGEN 0.2 08/06/2023 1100   NITRITE Positive (A) 09/04/2023 1525   NITRITE POSITIVE (A) 09/28/2022 2222   LEUKOCYTESUR 1+ (A) 09/04/2023 1525   LEUKOCYTESUR TRACE (A) 09/28/2022 2222    Lab Results  Component Value Date   LABMICR See below: 09/04/2023   WBCUA 11-30 (A) 09/04/2023   LABEPIT 0-10 09/04/2023   MUCUS Presence of (A) 06/06/2023   BACTERIA Many (A) 09/04/2023     Assessment & Plan:    1. Recurrent UTI  -  Could be related to bladder foreign body in the form of exposed Coaptite. Will plan for rescheduling her cystoscopy to evaluate this. Will also send her urine today for culture, suspect acute cystitis versus chronic cystitis. We'll treat her infection, and if her appointment is rescheduled for  sometime  down the road we'll likely re-treat her a few days in anticipation of the cystoscopy to ensure that it doesn't get cancelled again.   - Prescribe estrogen, a pea-sized amount, per urethra three times a week, and discuss how to apply this, risk and benefits, etc.  Reschedule cysto  I have reviewed the above documentation for accuracy and completeness, and I agree with the above.   Vanna Scotland, MD   [facility] [address] [place], [place] [address] [phone]

## 2023-09-08 LAB — CULTURE, URINE COMPREHENSIVE

## 2023-09-10 ENCOUNTER — Other Ambulatory Visit: Payer: Self-pay

## 2023-09-10 MED ORDER — AMOXICILLIN-POT CLAVULANATE 875-125 MG PO TABS
1.0000 | ORAL_TABLET | Freq: Two times a day (BID) | ORAL | 0 refills | Status: DC
Start: 2023-09-10 — End: 2023-11-05

## 2023-09-11 ENCOUNTER — Telehealth: Payer: Self-pay

## 2023-09-11 NOTE — Telephone Encounter (Signed)
Incoming call on triage line from pt who states she got a Clinical cytogeneticist message stating she has an infection, she questions if this is a bladder infection. Advised pt that this a urinary tract infection which does include the bladder. Pt voiced understanding.

## 2023-09-21 ENCOUNTER — Other Ambulatory Visit: Payer: Self-pay | Admitting: Family Medicine

## 2023-09-21 DIAGNOSIS — M48061 Spinal stenosis, lumbar region without neurogenic claudication: Secondary | ICD-10-CM

## 2023-10-17 ENCOUNTER — Ambulatory Visit: Payer: Self-pay | Admitting: Urology

## 2023-10-17 VITALS — BP 164/93 | HR 97

## 2023-10-17 DIAGNOSIS — N390 Urinary tract infection, site not specified: Secondary | ICD-10-CM | POA: Diagnosis not present

## 2023-10-17 DIAGNOSIS — N3289 Other specified disorders of bladder: Secondary | ICD-10-CM

## 2023-10-17 LAB — URINALYSIS, COMPLETE
Bilirubin, UA: NEGATIVE
Glucose, UA: NEGATIVE
Ketones, UA: NEGATIVE
Nitrite, UA: NEGATIVE
Protein,UA: NEGATIVE
Specific Gravity, UA: 1.01 (ref 1.005–1.030)
Urobilinogen, Ur: 0.2 mg/dL (ref 0.2–1.0)
pH, UA: 6.5 (ref 5.0–7.5)

## 2023-10-17 LAB — MICROSCOPIC EXAMINATION

## 2023-10-17 NOTE — Progress Notes (Unsigned)
   10/17/23  CC:  Chief Complaint  Patient presents with   Cysto    HPI: 83 year old female with a personal history of Macroplastique erosion who returns today for cystoscopy.  She was treated for urinary tract infection the end of January which is symptomatic.  She is feeling better today.  Her UA is fairly unremarkable.  Blood pressure (!) 164/93, pulse 97. NED. A&Ox3.   No respiratory distress   Abd soft, NT, ND Normal external genitalia with patent urethral meatus  Cystoscopy Procedure Note  Patient identification was confirmed, informed consent was obtained, and patient was prepped using Betadine solution.  Lidocaine jelly was administered per urethral meatus.    Procedure: - Flexible cystoscope introduced, without any difficulty.   - Thorough search of the bladder revealed:    normal urethral meatus; there is a small calcification embedded in the mucosa in the right mid urethra    normal urothelium    no stones    no ulcers     no tumors    no urethral polyps    no trabeculation  - Ureteral orifices were normal in position and appearance.  On retroflexion, there was slightly raised area of the bladder neck however this time, there was no surrounding inflammation or ulceration.  Post-Procedure: - Patient tolerated the procedure well  Assessment/ Plan:  Bladder foreign body Previously noted Coaptite erosion into the bladder has healed and there is no further debris or ulcerations in the bladder itself  There is an area in the mid urethra that has a small area of calcification with probably underlying erosion but this is quite small and not in continuity with the bladder.  Overall, appearance has improved.  As long as she is asymptomatic, would refrain from any other surveillance or intervention  Encouraged to continue topical estrogen cream for recurrent UTIs along with supplements  Follow-up as needed  Vanna Scotland, MD

## 2023-10-22 ENCOUNTER — Telehealth: Payer: Self-pay | Admitting: Family Medicine

## 2023-10-22 NOTE — Telephone Encounter (Signed)
 Copied from CRM [id number]. Topic: Medicare AWV >> Oct 22, 2023 11:15 AM Payton Doughty wrote: Reason for CRM: Called LVM 10/22/2023 to schedule AWV. Please schedule office or virtual visits.  Verlee Rossetti; Care Guide Ambulatory Clinical Support [place] [facility] Direct Dial: [phone]

## 2023-10-25 ENCOUNTER — Telehealth: Payer: Self-pay | Admitting: Family Medicine

## 2023-10-25 NOTE — Telephone Encounter (Signed)
 Your appointment with Dr Clent Ridges on 11/05/2023 will be a 3 month follow up. Dr Clent Ridges is leaving the practice and you may schedule a Transfer of Care at the time of check out with Dr Charlann Lange, MD , Bethanie Dicker, NP or Loma Sousa, is patient calls please schedule.

## 2023-10-30 ENCOUNTER — Other Ambulatory Visit: Payer: Self-pay | Admitting: Family Medicine

## 2023-10-30 DIAGNOSIS — M48061 Spinal stenosis, lumbar region without neurogenic claudication: Secondary | ICD-10-CM

## 2023-10-30 NOTE — Telephone Encounter (Unsigned)
 Copied from CRM [id number]. Topic: Clinical - Medication Refill >> Oct 30, 2023 11:17 AM Alcus Dad wrote: Most Recent Primary Care Visit:  Provider: Birdie Sons ERIC G  Department: [facility]  Visit Type: OFFICE VISIT  Date: 08/06/2023  Medication: omeprazole (PRILOSEC) 20 MG capsule gabapentin (NEURONTIN) 100 MG capsule rosuvastatin (CRESTOR) 40 MG tablet  Has the patient contacted their pharmacy? Yes (Agent: If no, request that the patient contact the pharmacy for the refill. If patient does not wish to contact the pharmacy document the reason why and proceed with request.) (Agent: If yes, when and what did the pharmacy advise?)  Is this the correct pharmacy for this prescription? Yes If no, delete pharmacy and type the correct one.  This is the patient's preferred pharmacy:  [facility] - [place], [place] - [address] [place] [address] Phone: [phone] Fax: [phone]   Has the prescription been filled recently? No  Is the patient out of the medication? Yes  Has the patient been seen for an appointment in the last year OR does the patient have an upcoming appointment? Yes  Can we respond through MyChart? Yes  Agent: Please be advised that Rx refills may take up to 3 business days. We ask that you follow-up with your pharmacy.

## 2023-10-31 MED ORDER — ROSUVASTATIN CALCIUM 40 MG PO TABS: 40.0000 mg | ORAL_TABLET | Freq: Every day | ORAL | 0 refills | Status: DC

## 2023-10-31 MED ORDER — OMEPRAZOLE 20 MG PO CPDR: 20.0000 mg | DELAYED_RELEASE_CAPSULE | Freq: Every day | ORAL | 0 refills | Status: DC

## 2023-10-31 MED ORDER — GABAPENTIN 100 MG PO CAPS: 200.0000 mg | ORAL_CAPSULE | Freq: Every day | ORAL | 0 refills | Status: DC

## 2023-11-01 ENCOUNTER — Other Ambulatory Visit: Payer: Self-pay | Admitting: Family Medicine

## 2023-11-01 DIAGNOSIS — F419 Anxiety disorder, unspecified: Secondary | ICD-10-CM

## 2023-11-05 ENCOUNTER — Encounter: Payer: Self-pay | Admitting: Family Medicine

## 2023-11-05 ENCOUNTER — Ambulatory Visit (INDEPENDENT_AMBULATORY_CARE_PROVIDER_SITE_OTHER): Payer: Medicare HMO | Admitting: Family Medicine

## 2023-11-05 VITALS — BP 120/78 | HR 95 | Temp 98.7°F | Resp 20 | Ht 59.0 in | Wt 140.4 lb

## 2023-11-05 DIAGNOSIS — M48061 Spinal stenosis, lumbar region without neurogenic claudication: Secondary | ICD-10-CM

## 2023-11-05 DIAGNOSIS — M5416 Radiculopathy, lumbar region: Secondary | ICD-10-CM

## 2023-11-05 DIAGNOSIS — K2100 Gastro-esophageal reflux disease with esophagitis, without bleeding: Secondary | ICD-10-CM

## 2023-11-05 DIAGNOSIS — J309 Allergic rhinitis, unspecified: Secondary | ICD-10-CM | POA: Diagnosis not present

## 2023-11-05 DIAGNOSIS — E559 Vitamin D deficiency, unspecified: Secondary | ICD-10-CM

## 2023-11-05 DIAGNOSIS — I10 Essential (primary) hypertension: Secondary | ICD-10-CM | POA: Diagnosis not present

## 2023-11-05 DIAGNOSIS — F39 Unspecified mood [affective] disorder: Secondary | ICD-10-CM | POA: Diagnosis not present

## 2023-11-05 DIAGNOSIS — E538 Deficiency of other specified B group vitamins: Secondary | ICD-10-CM

## 2023-11-05 DIAGNOSIS — E781 Pure hyperglyceridemia: Secondary | ICD-10-CM

## 2023-11-05 DIAGNOSIS — E782 Mixed hyperlipidemia: Secondary | ICD-10-CM

## 2023-11-05 MED ORDER — VITAMIN D 25 MCG (1000 UNIT) PO TABS: 1000.0000 [IU] | ORAL_TABLET | Freq: Every day | ORAL | 3 refills | Status: AC

## 2023-11-05 MED ORDER — OMEPRAZOLE 20 MG PO CPDR: 20.0000 mg | DELAYED_RELEASE_CAPSULE | Freq: Every day | ORAL | 3 refills | Status: AC

## 2023-11-05 MED ORDER — GABAPENTIN 100 MG PO CAPS
200.0000 mg | ORAL_CAPSULE | Freq: Every day | ORAL | 11 refills | Status: DC
Start: 2023-11-05 — End: 2024-04-02

## 2023-11-05 MED ORDER — ROSUVASTATIN CALCIUM 40 MG PO TABS: 40.0000 mg | ORAL_TABLET | Freq: Every day | ORAL | 3 refills | Status: DC

## 2023-11-05 MED ORDER — FLUTICASONE PROPIONATE 50 MCG/ACT NA SUSP: 1.0000 | Freq: Every day | NASAL | 2 refills | Status: DC

## 2023-11-05 MED ORDER — BUPROPION HCL ER (XL) 300 MG PO TB24: 300.0000 mg | ORAL_TABLET | Freq: Every day | ORAL | 3 refills | Status: AC

## 2023-11-05 MED ORDER — VITAMIN B-12 1000 MCG PO TABS: 1000.0000 ug | ORAL_TABLET | Freq: Every day | ORAL | 3 refills | Status: AC

## 2023-11-05 MED ORDER — VENLAFAXINE HCL ER 150 MG PO CP24: 150.0000 mg | ORAL_CAPSULE | Freq: Every day | ORAL | 3 refills | Status: DC

## 2023-11-05 NOTE — Progress Notes (Signed)
 SUBJECTIVE:   Chief Complaint  Patient presents with   Medical Management of Chronic Issues    3 month follow up   HPI Presents for management of chronic disease.    Discussed the use of AI scribe software for clinical note transcription with the patient, who gave verbal consent to proceed.  History of Present Illness Ashley [name] is an 83 year old female who presents for a follow-up visit and medication management.  She has not had any lab work done since June of the previous year and is due for blood work. She is in the process of transferring her care to a new provider as her current provider is leaving at the end of June.  She has been taking Wellbutrin 300 mg daily in the morning for depression, which was added to her regimen by Dr. Birdie Sons. She also takes Effexor (venlafaxine) 150 mg daily in the morning. The Wellbutrin has been helpful, although she continues to experience some difficulty. No insomnia, hot flashes, or excessive sweating. She is eating well but tries not to eat too much.  She recently started using estrogen cream, prescribed by her urologist, to help with urinary symptoms. She applies a pea-sized amount to her urethra. She also uses Flonase, one spray once a day, which she finds effective. She recently recovered from a sinus infection.  She takes gabapentin 200 mg, two tablets at night, for restless legs and to aid sleep. Additionally, she uses magnesium and melatonin.  She takes omeprazole for gastrointestinal issues and has been on it for a long time. She is aware of potential long-term effects on bone health but has not tried alternatives like Pepcid.  She takes Crestor 40 mg for cholesterol management and has been on it for a long time. She has not had a heart attack or stroke but mentions a history of mini-strokes.  She lives alone with her cat and manages her daily activities independently. Her daughter, who lives nearby, checks on her occasionally.  No chest pain, shortness of breath, leg swelling, or significant abdominal pain. She uses over-the-counter stool softeners to maintain regularity and denies constipation or diarrhea.    PERTINENT PMH / PSH: As above  OBJECTIVE:  BP 120/78   Pulse 95   Temp 98.7 F (37.1 C)   Resp 20   Ht 4\' 11"  (1.499 m)   Wt 140 lb 6 oz (63.7 kg)   SpO2 97%   BMI 28.35 kg/m    Physical Exam Vitals reviewed.  Constitutional:      General: She is not in acute distress.    Appearance: She is not ill-appearing.  HENT:     Head: Normocephalic.     Right Ear: Tympanic membrane, ear canal and external ear normal.     Left Ear: Tympanic membrane, ear canal and external ear normal.     Nose: Nose normal.     Mouth/Throat:     Mouth: Mucous membranes are moist.  Eyes:     Extraocular Movements: Extraocular movements intact.     Conjunctiva/sclera: Conjunctivae normal.     Pupils: Pupils are equal, round, and reactive to light.  Neck:     Thyroid: No thyromegaly or thyroid tenderness.     Vascular: No carotid bruit.  Cardiovascular:     Rate and Rhythm: Normal rate and regular rhythm.     Pulses: Normal pulses.     Heart sounds: Normal heart sounds.  Pulmonary:     Effort: Pulmonary effort is  normal.     Breath sounds: Normal breath sounds.  Abdominal:     General: Bowel sounds are normal. There is no distension.     Palpations: Abdomen is soft.     Tenderness: There is no abdominal tenderness. There is no right CVA tenderness, left CVA tenderness, guarding or rebound.  Musculoskeletal:        General: Normal range of motion.     Cervical back: Normal range of motion.     Right lower leg: No edema.     Left lower leg: No edema.  Lymphadenopathy:     Cervical: No cervical adenopathy.  Skin:    Capillary Refill: Capillary refill takes less than 2 seconds.  Neurological:     General: No focal deficit present.     Mental Status: She is alert and oriented to person, place, and time. Mental  status is at baseline.     Motor: No weakness.  Psychiatric:        Mood and Affect: Mood normal.        Behavior: Behavior normal.        Thought Content: Thought content normal.        Judgment: Judgment normal.           11/05/2023    1:44 PM 08/06/2023   10:30 AM 06/06/2023   10:41 AM 04/25/2023    2:15 PM 02/13/2023   11:56 AM  Depression screen PHQ 2/9  Decreased Interest 2 0 3 2 1   Down, Depressed, Hopeless 3 0 3 1 2   PHQ - 2 Score 5 0 6 3 3   Altered sleeping 1 0 0 0 1  Tired, decreased energy 1 0 2 2 3   Change in appetite 0 0 0 0 2  Feeling bad or failure about yourself  0 0 0 0 0  Trouble concentrating 0 0 0 0 0  Moving slowly or fidgety/restless 0 0 0 0 0  Suicidal thoughts 0 0 0 0 0  PHQ-9 Score 7 0 8 5 9   Difficult doing work/chores Not difficult at all Not difficult at all Somewhat difficult Not difficult at all Not difficult at all      11/05/2023    1:44 PM 08/06/2023   10:30 AM 06/06/2023   10:42 AM 04/25/2023    2:15 PM  GAD 7 : Generalized Anxiety Score  Nervous, Anxious, on Edge 1 0 0 1  Control/stop worrying 2 0 1 0  Worry too much - different things 1 0 1 0  Trouble relaxing 1 0 0 1  Restless 0 0 0 0  Easily annoyed or irritable 0 0 0 0  Afraid - awful might happen 1 0 0 0  Total GAD 7 Score 6 0 2 2  Anxiety Difficulty Somewhat difficult Not difficult at all Somewhat difficult Not difficult at all    ASSESSMENT/PLAN:  Essential hypertension Assessment & Plan: Well controlled without antihypertensives   Orders: -     Comprehensive metabolic panel with GFR; Future  Spinal stenosis of lumbar region with radiculopathy Assessment & Plan: Takes gabapentin 200 mg at night  which aids sleep. - Refill gabapentin 200 mg at night.  Orders: -     Gabapentin; Take 2 capsules (200 mg total) by mouth at bedtime.  Dispense: 60 capsule; Refill: 11  Mood disorder [facility]) Assessment & Plan: Experiencing depression following her husband's death in  05-Sep-2022. Currently on Wellbutrin 300 mg daily, added to Effexor regimen. Reports Wellbutrin is helpful but  still has coping difficulties. Takes Wellbutrin in the morning to avoid insomnia. - Continue Wellbutrin 300 mg daily in the morning. - Continue Effexor as prescribed. - Monitor for insomnia and weight loss.  Orders: -     Venlafaxine HCl ER; Take 1 capsule (150 mg total) by mouth daily.  Dispense: 90 capsule; Refill: 3 -     buPROPion HCl ER (XL); Take 1 tablet (300 mg total) by mouth daily.  Dispense: 90 tablet; Refill: 3  Allergic rhinitis, unspecified seasonality, unspecified trigger Assessment & Plan: Uses Flonase one spray daily for allergic rhinitis. Recently recovered from sinus infection and finds Flonase effective. - Continue Flonase one spray daily.  Orders: -     Fluticasone Propionate; Place 1-2 sprays into both nostrils daily.  Dispense: 16 g; Refill: 2  Vitamin B 12 deficiency -     Vitamin B12; Future -     Vitamin B-12; Take 1 tablet (1,000 mcg total) by mouth daily.  Dispense: 90 tablet; Refill: 3  Mixed hyperlipidemia Assessment & Plan: On Crestor 40 mg for hyperlipidemia. Requires cholesterol monitoring for potential medication adjustment. No history of myocardial infarction or cerebrovascular accident, but has experienced transient ischemic attacks, necessitating careful monitoring. - Order fasting lipid panel. - Refill  Crestor 40 mg daily. - Review lab results for potential medication adjustment.  Orders: -     Rosuvastatin Calcium; Take 1 tablet (40 mg total) by mouth daily.  Dispense: 90 tablet; Refill: 3 -     Lipid panel; Future  Gastroesophageal reflux disease with esophagitis without hemorrhage Assessment & Plan: Long-term omeprazole use raises concerns about bone health. Discussed potential transition to Pepcid but advised against abrupt cessation of omeprazole due to rebound acid hypersecretion risk. Transition discussion to occur with new  provider post-transfer of care. - Refill  omeprazole as prescribed. - Discuss potential transition to Pepcid with new provider post-transfer of care.  Orders: -     Omeprazole; Take 1 capsule (20 mg total) by mouth daily.  Dispense: 90 capsule; Refill: 3  Vitamin D deficiency -     VITAMIN D 25 Hydroxy (Vit-D Deficiency, Fractures); Future -     Vitamin D; Take 1 tablet (1,000 Units total) by mouth daily.  Dispense: 90 tablet; Refill: 3       PDMP reviewed  Return if symptoms worsen or fail to improve, for PCP.  Dana Allan, MD

## 2023-11-05 NOTE — Patient Instructions (Addendum)
 It was a pleasure meeting you today. Thank you for allowing me to take part in your health care.  Our goals for today as we discussed include:  Schedule lab appointment.  Fast for 10 hours  Refills sent for requested medications   This is a list of the screening recommended for you and due dates:  Health Maintenance  Topic Date Due   Pneumonia Vaccine (2 of 2 - PCV) 06/08/2017   Medicare Annual Wellness Visit  12/18/2023   DTaP/Tdap/Td vaccine (2 - Td or Tdap) 12/29/2031   Flu Shot  Completed   DEXA scan (bone density measurement)  Completed   Zoster (Shingles) Vaccine  Completed   HPV Vaccine  Aged Out   Colon Cancer Screening  Discontinued   COVID-19 Vaccine  Discontinued      If you have any questions or concerns, please do not hesitate to call the office at [phone].  I look forward to our next visit and until then take care and stay safe.  Regards,   Dana Allan, MD   [facility]

## 2023-11-11 ENCOUNTER — Encounter: Payer: Self-pay | Admitting: Family Medicine

## 2023-11-11 NOTE — Assessment & Plan Note (Signed)
 Long-term omeprazole use raises concerns about bone health. Discussed potential transition to Pepcid but advised against abrupt cessation of omeprazole due to rebound acid hypersecretion risk. Transition discussion to occur with new provider post-transfer of care. - Refill  omeprazole as prescribed. - Discuss potential transition to Pepcid with new provider post-transfer of care.

## 2023-11-11 NOTE — Assessment & Plan Note (Signed)
 On Crestor 40 mg for hyperlipidemia. Requires cholesterol monitoring for potential medication adjustment. No history of myocardial infarction or cerebrovascular accident, but has experienced transient ischemic attacks, necessitating careful monitoring. - Order fasting lipid panel. - Refill  Crestor 40 mg daily. - Review lab results for potential medication adjustment.

## 2023-11-11 NOTE — Assessment & Plan Note (Signed)
 Takes gabapentin 200 mg at night  which aids sleep. - Refill gabapentin 200 mg at night.

## 2023-11-11 NOTE — Assessment & Plan Note (Signed)
?   Well controlled without antihypertensives

## 2023-11-11 NOTE — Assessment & Plan Note (Signed)
 Experiencing depression following her husband's death in 2022-09-12. Currently on Wellbutrin 300 mg daily, added to Effexor regimen. Reports Wellbutrin is helpful but still has coping difficulties. Takes Wellbutrin in the morning to avoid insomnia. - Continue Wellbutrin 300 mg daily in the morning. - Continue Effexor as prescribed. - Monitor for insomnia and weight loss.

## 2023-11-11 NOTE — Assessment & Plan Note (Signed)
 Uses Flonase one spray daily for allergic rhinitis. Recently recovered from sinus infection and finds Flonase effective. - Continue Flonase one spray daily.

## 2023-11-14 ENCOUNTER — Other Ambulatory Visit

## 2024-01-21 ENCOUNTER — Other Ambulatory Visit (INDEPENDENT_AMBULATORY_CARE_PROVIDER_SITE_OTHER)

## 2024-01-21 DIAGNOSIS — E538 Deficiency of other specified B group vitamins: Secondary | ICD-10-CM

## 2024-01-21 DIAGNOSIS — E782 Mixed hyperlipidemia: Secondary | ICD-10-CM | POA: Diagnosis not present

## 2024-01-21 DIAGNOSIS — I10 Essential (primary) hypertension: Secondary | ICD-10-CM

## 2024-01-21 DIAGNOSIS — E559 Vitamin D deficiency, unspecified: Secondary | ICD-10-CM

## 2024-01-21 NOTE — Addendum Note (Signed)
 Addended by: Thressa Flora D on: 01/21/2024 07:52 AM   Modules accepted: Orders

## 2024-01-21 NOTE — Addendum Note (Signed)
 Addended by: Thressa Flora D on: 01/21/2024 01:53 PM   Modules accepted: Orders

## 2024-01-22 LAB — COMPREHENSIVE METABOLIC PANEL WITH GFR
ALT: 16 IU/L (ref 0–32)
AST: 21 IU/L (ref 0–40)
Albumin: 4.5 g/dL (ref 3.8–4.8)
Alkaline Phosphatase: 64 IU/L (ref 44–121)
BUN/Creatinine Ratio: 18 (ref 12–28)
BUN: 16 mg/dL (ref 8–27)
Bilirubin Total: 0.3 mg/dL (ref 0.0–1.2)
CO2: 20 mmol/L (ref 20–29)
Calcium: 9.5 mg/dL (ref 8.7–10.3)
Chloride: 104 mmol/L (ref 96–106)
Creatinine, Ser: 0.89 mg/dL (ref 0.57–1.00)
Globulin, Total: 2 g/dL (ref 1.5–4.5)
Glucose: 88 mg/dL (ref 70–99)
Potassium: 4.8 mmol/L (ref 3.5–5.2)
Sodium: 142 mmol/L (ref 134–144)
Total Protein: 6.5 g/dL (ref 6.0–8.5)
eGFR: 65 mL/min/{1.73_m2} (ref >59)

## 2024-01-22 LAB — LIPID PANEL
Chol/HDL Ratio: 2 ratio (ref 0.0–4.4)
Cholesterol, Total: 129 mg/dL (ref 100–199)
HDL: 63 mg/dL (ref >39)
LDL Chol Calc (NIH): 48 mg/dL (ref 0–99)
Triglycerides: 100 mg/dL (ref 0–149)
VLDL Cholesterol Cal: 18 mg/dL (ref 5–40)

## 2024-01-22 LAB — VITAMIN B12: Vitamin B-12: 604 pg/mL (ref 232–1245)

## 2024-01-22 LAB — VITAMIN D 25 HYDROXY (VIT D DEFICIENCY, FRACTURES): Vit D, 25-Hydroxy: 62.7 ng/mL (ref 30.0–100.0)

## 2024-01-24 ENCOUNTER — Ambulatory Visit: Payer: Self-pay | Admitting: Family Medicine

## 2024-02-11 ENCOUNTER — Ambulatory Visit: Payer: Self-pay

## 2024-02-11 ENCOUNTER — Ambulatory Visit
Admission: EM | Admit: 2024-02-11 | Discharge: 2024-02-11 | Disposition: A | Discharge status: Home / Self Care | Attending: Emergency Medicine | Admitting: Emergency Medicine

## 2024-02-11 DIAGNOSIS — R6884 Jaw pain: Secondary | ICD-10-CM | POA: Diagnosis not present

## 2024-02-11 DIAGNOSIS — H9201 Otalgia, right ear: Secondary | ICD-10-CM

## 2024-02-11 MED ORDER — AMOXICILLIN 500 MG PO CAPS: 500.0000 mg | ORAL_CAPSULE | Freq: Three times a day (TID) | ORAL | 0 refills | Status: AC

## 2024-02-11 NOTE — Telephone Encounter (Signed)
 Noted

## 2024-02-11 NOTE — Telephone Encounter (Signed)
 FYI Only or Action Required?: FYI only for provider.  Patient was last seen in primary care on 11/05/2023 by Ashley Merle, MD. Called Nurse Triage reporting Jaw Pain. Symptoms began several weeks ago. Interventions attempted: Other: taking smaller bites to chew. Symptoms are: unchanged.  Triage Disposition: See HCP Within 4 Hours (Or PCP Triage)  Patient/caregiver understands and will follow disposition?: Yes  Copied from CRM [id number]. Topic: Clinical - Red Word Triage >> Feb 11, 2024  9:56 AM Zebedee SAUNDERS wrote: Red Word that prompted transfer to Nurse Triage: Pt has jaw pain on right side. Reason for Disposition  [1] Swollen area of face AND [2] toothache  Answer Assessment - Initial Assessment Questions Pt saw dentist and had an xray but dentist said xray did not go up high enough to see and then just told her to take smaller bites. Jaw pain still present and also in R ear, no pain to anywhere else. Pt sent to UC.  Pt states she wants her PCP to see if she can have any other meds for feeling down since her husband passed last year, because holidays are coming up. Pt does not want to harm herself, she is aware of her being down because of the holidays.  1. ONSET: When did the pain start? (e.g., minutes, hours, days)     R side jaw pain  2. ONSET: Does the pain come and go, or has it been constant since it started? (e.g., constant, intermittent, fleeting)     Past 1-2 weeks 3. SEVERITY: How bad is the pain?   (Scale 1-10; mild, moderate or severe)   - MILD (1-3): doesn't interfere with normal activities    - MODERATE (4-7): interferes with normal activities or awakens from sleep    - SEVERE (8-10): excruciating pain, unable to do any normal activities      9/10 4. LOCATION: Where does it hurt?      Pain to R side jaw and R ear 5. RASH: Is there any redness, rash, or swelling of the face?     R side seems to be a little swollen 6. FEVER: Do you have a fever? If Yes, ask: What is  it, how was it measured, and when did it start?      no 7. OTHER SYMPTOMS: Do you have any other symptoms? (e.g., fever, toothache, nasal discharge, nasal congestion, clicking sensation in jaw joint)     No tooth pain, just jaw pain  - some discomfort to R ear - no headaches  - no nasal congestion - no vision changes, no chest pain, no   Tried to make appmt with ENT doctor, but they said she needs a hearing test first.  Protocols used: Face Pain-A-AH

## 2024-02-11 NOTE — ED Provider Notes (Signed)
 Ashley [name]    CSN: [id number] Arrival date & time: 02/11/24  1146      History   Chief Complaint Chief Complaint  Patient presents with   Otalgia   Jaw Pain    HPI Ashley [name] is a 83 y.o. female.  Patient presents with right side jaw pain x 2 weeks.  The pain is worse when she chews.  She states both sides of her jaw will pop and click when she opens her mouth.  She also reports right ear pain.  She states her symptoms started after she had her teeth cleaned at the dentist 2 weeks ago.  She denies fever, sore throat, difficulty swallowing, shortness of breath.  She has been taking Tylenol  and ibuprofen for her symptoms.  Patient reports she is established with a dentist and also with an ENT but was not able to get an appointment with either today.  The history is provided by the patient and medical records.    Past Medical History:  Diagnosis Date   Abnormal uterine bleeding (AUB)    a. benign endometrial polyps on D+C in 03/2017   Anxiety    Arthritis    Carpal tunnel syndrome of left wrist    Cerebral hemorrhage (HCC) 2023   Chicken pox    Depression    GERD (gastroesophageal reflux disease)    History of stress test    a. 08/2017 Lexiscan  MV: EF 55-65%. No ischemia/infarct. Low risk.   Hyperlipidemia    Hypertension    Lacunar infarction (HCC)    a. Dx in setting of dizziness/vertigo; b. 02/2018 Carotid U/S: bilateral intimal thickening and atherosclerotic plaque. No hemodynamically significant stenoses; c. 03/2018 MRI Brain: No mass or abnl enhancement. Chronic microvascular ischemic changes and mod volume loss of the brain. 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   Stroke (HCC)    PT STATES SHE WAS TOLD THIS BY HER PCP BUT PT STATES SHE NEVER KNEW SHE HAD A STROKE-NO RESIDUAL EFFECTS   Vertigo      Patient Active Problem List   Diagnosis Date Noted   Urine frequency 06/06/2023   Rash of genital area 06/06/2023   Hearing difficulty 04/25/2023   Urge incontinence 01/12/2023   Overweight (BMI 25.0-29.9) 09/29/2022   DDD (degenerative disc disease), cervical 05/09/2022   Proteinuria 01/31/2022   Memory difficulty 01/06/2022   Intraparenchymal hemorrhage of brain (HCC) 12/29/2021   Vitamin B 12 deficiency 12/29/2021   Frequent falls 12/29/2021   Nasal bone fractures 12/29/2021   Balance problem 08/02/2021   Allergic rhinitis 01/18/2021   Endometrial polyp 12/01/2020   Pelvic pain in female 12/01/2020   Shoulder strain, right, initial encounter 09/07/2020   Primary osteoarthritis of left knee 03/30/2020   Carpal tunnel syndrome 05/07/2019   Hypertriglyceridemia 08/28/2018   Partial tear of left rotator cuff 08/28/2018   Plantar fasciitis of right foot 05/17/2018   De Quervain's tenosynovitis, right 05/17/2018   Spinal stenosis of lumbar region with radiculopathy 11/12/2017   Prediabetes 10/11/2017   Vertigo 10/11/2017   Family history of ovarian cancer 01/16/2017   Essential hypertension 01/16/2017   GERD (gastroesophageal reflux disease) 01/16/2017   Allergic angioedema 04/18/2016   Mood disorder (HCC) 12/28/2014   Hyperlipidemia 12/28/2014    Past Surgical History:  Procedure Laterality Date   BACK SURGERY  11/19/2017  lumbar laminectomy   CARPAL TUNNEL RELEASE Right 07/30/2019   CARPAL TUNNEL RELEASE Left 02/21/2021   Procedure: CARPAL TUNNEL RELEASE;  Surgeon: Mardee Lynwood SQUIBB, MD;  Location: [facility];  Service: Orthopedics;  Laterality: Left;   COLONOSCOPY     2003, 2011, 2021   COLONOSCOPY WITH PROPOFOL  N/A 12/24/2019   Procedure: COLONOSCOPY WITH PROPOFOL ;  Surgeon: Toledo, Ladell POUR, MD;  Location: [facility];  Service: Gastroenterology;  Laterality: N/A;   CYSTOSCOPY MACROPLASTIQUE IMPLANT N/A 05/15/2019   Procedure: CYSTOSCOPY MACROPLASTIQUE IMPLANT;   Surgeon: Kassie Ozell SAUNDERS, MD;  Location: [facility];  Service: Urology;  Laterality: N/A;   DILATATION & CURETTAGE/HYSTEROSCOPY WITH MYOSURE N/A 03/30/2017   Procedure: DILATATION & CURETTAGE/HYSTEROSCOPY WITH MYOSURE;  Surgeon: Schermerhorn, Debby PARAS, MD;  Location: [facility];  Service: Gynecology;  Laterality: N/A;   EYE SURGERY Bilateral    Cataract Extraction with IOL   JOINT REPLACEMENT     KNEE ARTHROPLASTY Left 12/01/2020   Procedure: COMPUTER ASSISTED TOTAL KNEE ARTHROPLASTY;  Surgeon: Mardee Lynwood SQUIBB, MD;  Location: [facility];  Service: Orthopedics;  Laterality: Left;   SKIN LESION EXCISION Right    synovial cyst right AC joint   TONSILLECTOMY AND ADENOIDECTOMY     Age 45    OB History   No obstetric history on file.      Home Medications    Prior to Admission medications   Medication Sig Start Date End Date Taking? Authorizing Provider  amoxicillin  (AMOXIL ) 500 MG capsule Take 1 capsule (500 mg total) by mouth 3 (three) times daily for 7 days. 02/11/24 02/18/24 Yes Corlis Burnard DEL, NP  acetaminophen  (TYLENOL ) 650 MG CR tablet Take 1,300 mg by mouth every 8 (eight) hours as needed for pain.    [provider]  aspirin  EC 81 MG tablet Take 81 mg by mouth daily. Swallow whole.    [provider]  buPROPion  (WELLBUTRIN  XL) 300 MG 24 hr tablet Take 1 tablet (300 mg total) by mouth daily. 11/05/23   Hope Merle, MD  cholecalciferol (VITAMIN D3) 25 MCG (1000 UNIT) tablet Take 1 tablet (1,000 Units total) by mouth daily. 11/05/23   Hope Merle, MD  Cranberry-Vitamin C-Probiotic (AZO CRANBERRY PO) Take by mouth.    [provider]  cyanocobalamin  (VITAMIN B12) 1000 MCG tablet Take 1 tablet (1,000 mcg total) by mouth daily. 11/05/23   Hope Merle, MD  EPINEPHrine  (EPIPEN  2-PAK) 0.3 mg/0.3 mL IJ SOAJ injection Inject 0.3 mLs (0.3 mg total) into the muscle as needed for anaphylaxis. 02/20/19   Dorothyann Drivers, MD  estradiol  (ESTRACE ) 0.1 MG/GM vaginal cream  Estrogen Cream Instruction Discard applicator Apply pea sized amount to tip of finger to urethra before bed. Wash hands well after application. Use Monday, Wednesday and Friday 09/04/23   Penne Knee, MD  fluticasone  (FLONASE ) 50 MCG/ACT nasal spray Place 1-2 sprays into both nostrils daily. 11/05/23   Hope Merle, MD  gabapentin  (NEURONTIN ) 100 MG capsule Take 2 capsules (200 mg total) by mouth at bedtime. 11/05/23   Hope Merle, MD  Magnesium  500 MG CAPS Take by mouth.    [provider]  Melatonin 10 MG TABS Take 10 mg by mouth at bedtime.    [provider]  omeprazole  (PRILOSEC) 20 MG capsule Take 1 capsule (20 mg total) by mouth daily. 11/05/23   Hope Merle, MD  Polyethyl Glycol-Propyl Glycol (SYSTANE) 0.4-0.3 % SOLN Place 1 drop into both eyes 3 (three) times daily as needed (dry/irritated eyes.).    [provider]  rosuvastatin  (CRESTOR ) 40 MG tablet Take 1 tablet (40 mg total) by mouth daily. 11/05/23   Walsh, Tanya, MD  TURMERIC PO Take by mouth.    [provider]  venlafaxine  XR (EFFEXOR -XR) 150 MG 24 hr capsule Take 1 capsule (150 mg total) by mouth daily. 11/05/23   Hope Merle, MD    Family History Family History  Problem Relation Age of Onset   Rheum arthritis Mother    Ovarian cancer Sister    COPD Sister    CAD Sister    Heart disease Father    Aneurysm Father    Cancer Brother    Thrombosis Maternal Grandmother        a. DVT   Thrombosis Maternal Grandfather        a. DVT   Thrombosis Paternal Grandmother        a. DVT   Thrombosis Paternal Grandfather        a. DVT    Social History Social History   Tobacco Use   Smoking status: Former    Current packs/day: 0.00    Average packs/day: 0.5 packs/day for 15.0 years (7.5 ttl pk-yrs)    Types: Cigarettes    Start date: 08/07/1961    Quit date: 08/07/1976    Years since quitting: 47.5   Smokeless tobacco: Never  Vaping Use   Vaping status: Never Used  Substance Use Topics    Alcohol  use: Yes    Alcohol /week: 1.0 standard drink of alcohol     Types: 1 Glasses of wine per week    Comment: ocassionally   Drug use: No     Allergies   Ace inhibitors, Pravastatin, and Simvastatin   Review of Systems Review of Systems  Constitutional:  Negative for chills and fever.  HENT:  Positive for ear pain. Negative for ear discharge, sore throat, trouble swallowing and voice change.   Respiratory:  Negative for cough and shortness of breath.      Physical Exam Triage Vital Signs ED Triage Vitals  Encounter Vitals Group     BP 02/11/24 1244 133/88     Girls Systolic BP Percentile --      Girls Diastolic BP Percentile --      Boys Systolic BP Percentile --      Boys Diastolic BP Percentile --      Pulse Rate 02/11/24 1244 90     Resp 02/11/24 1244 18     Temp 02/11/24 1244 97.9 F (36.6 C)     Temp src --      SpO2 02/11/24 1244 96 %     Weight --      Height --      Head Circumference --      Peak Flow --      Pain Score 02/11/24 1252 6     Pain Loc --      Pain Education --      Exclude from Growth Chart --    No data found.  Updated Vital Signs BP 133/88   Pulse 90   Temp 97.9 F (36.6 C)   Resp 18   SpO2 96%   Visual Acuity Right Eye Distance:   Left Eye Distance:   Bilateral Distance:    Right Eye Near:   Left Eye Near:    Bilateral Near:     Physical Exam Constitutional:      General: She is not in acute distress. HENT:     Right Ear: Tympanic membrane  normal.     Left Ear: Tympanic membrane normal.     Nose: Nose normal.     Mouth/Throat:     Mouth: Mucous membranes are moist.     Dentition: No dental tenderness or gingival swelling.     Pharynx: Oropharynx is clear.     Comments: Right jaw mildly tender to palpation. Able to open jaw without difficulty; no crepitus noted. No mass or lymphadenopathy.  Teeth in good repair. Cardiovascular:     Rate and Rhythm: Normal rate and regular rhythm.  Pulmonary:     Effort:  Pulmonary effort is normal. No respiratory distress.  Musculoskeletal:     Cervical back: Neck supple.  Lymphadenopathy:     Cervical: No cervical adenopathy.  Neurological:     Mental Status: She is alert.      UC Treatments / Results  Labs (all labs ordered are listed, but only abnormal results are displayed) Labs Reviewed - No data to display  EKG   Radiology No results found.  Procedures Procedures (including critical care time)  Medications Ordered in UC Medications - No data to display  Initial Impression / Assessment and Plan / UC Course  I have reviewed the triage vital signs and the nursing notes.  Pertinent labs & imaging results that were available during my care of the patient were reviewed by me and considered in my medical decision making (see chart for details).   Jaw pain, right otalgia.  Afebrile and vital signs are stable.  Patient is able to open and close her jaw without difficulty.  Her right side jaw is mildly tender to palpation.  Treating today with 7-day course of amoxicillin  and instructed patient to follow-up with her PCP, her dentist, or her ENT.  Patient reports she has an established dentist and also in an established ENT.  She agrees to plan of care. Final Clinical Impressions(s) / UC Diagnoses   Final diagnoses:  Jaw pain  Otalgia, right     Discharge Instructions      Take the amoxicillin  as directed.  Follow-up with your primary care provider, dentist, or ENT.     ED Prescriptions     Medication Sig Dispense Auth. Provider   amoxicillin  (AMOXIL ) 500 MG capsule Take 1 capsule (500 mg total) by mouth 3 (three) times daily for 7 days. 21 capsule Corlis Burnard DEL, NP      PDMP not reviewed this encounter.   Corlis Burnard DEL, NP 02/11/24 1327

## 2024-02-11 NOTE — Discharge Instructions (Addendum)
 Take the amoxicillin  as directed.  Follow-up with your primary care provider, dentist, or ENT.

## 2024-02-11 NOTE — ED Triage Notes (Signed)
 Patient to Urgent Care with complaints of jaw pain w/ eating. Reports worse on the right side. States her jaw pops and clicks when she opens her mouth. Occasionally having R sided ear pain.  Symptoms x2 weeks. States symptoms started after having a cleaning at the dentist.

## 2024-02-21 ENCOUNTER — Ambulatory Visit: Payer: Self-pay

## 2024-02-21 NOTE — Telephone Encounter (Signed)
 LM to follow up with patient.

## 2024-02-21 NOTE — Telephone Encounter (Signed)
 Pt has been scheduled to see Dr. Narendra on Monday. Pt is having continued jaw pain after a dental cleaning about a week ago. Pt stated she reached out to her dentist office and they were unable to advise her in anyway. Pt then went to an UC and prescribed an antibiotic. Pt stated that the antibiotic did not help with the pain.

## 2024-02-21 NOTE — Telephone Encounter (Signed)
 FYI Only or Action Required?: FYI only for provider.  Patient was last seen in primary care on 11/05/2023 by Hope Merle, MD.  Called Nurse Triage reporting Jaw Pain.  Symptoms began a week ago.  Interventions attempted: Rest, hydration, or home remedies.  Symptoms are: unchanged.  Triage Disposition: See PCP When Office is Open (Within 3 Days)  Patient/caregiver understands and will follow disposition?: Yes      Copied from CRM [id number]. Topic: Clinical - Red Word Triage >> Feb 21, 2024  2:06 PM Harlene ORN wrote: Red Word that prompted transfer to Nurse Triage: went to the dentist for a check up, she started having jaw pain. Went to Urgent Care last week and was given antibiotics for treatment, but they are not effective. Reason for Disposition  [1] MILD-MODERATE mouth pain AND [2] present > 3 days  Answer Assessment - Initial Assessment Questions 1. ONSET: When did your mouth start hurting? (e.g., hours or days ago)      S/p dental cleaning - pt reports reached out to dentist with no guidance. Pt then went to UC and was given abx x 7 days that has not provided relief 2. SEVERITY: How bad is the pain? (Scale 1-10; or mild, moderate or severe)     Pain with eating Cannot eat anything hard 3. SORES: Are there any sores or ulcers in the mouth? If Yes, ask: What part of the mouth are the sores in?     R side of jaw 4. FEVER: Do you have a fever? If Yes, ask: What is your temperature, how was it measured, and when did it start?     denies 5. CAUSE: What do you think is causing the mouth pain?     unknown 6. OTHER SYMPTOMS: Do you have any other symptoms? (e.g., difficulty breathing)     denies  Protocols used: Mouth Pain-A-AH

## 2024-02-25 ENCOUNTER — Ambulatory Visit (INDEPENDENT_AMBULATORY_CARE_PROVIDER_SITE_OTHER): Admitting: Internal Medicine

## 2024-02-25 ENCOUNTER — Encounter: Payer: Self-pay | Admitting: Internal Medicine

## 2024-02-25 VITALS — BP 122/76 | HR 92 | Ht 59.0 in | Wt 141.8 lb

## 2024-02-25 DIAGNOSIS — I10 Essential (primary) hypertension: Secondary | ICD-10-CM | POA: Diagnosis not present

## 2024-02-25 DIAGNOSIS — R6884 Jaw pain: Secondary | ICD-10-CM | POA: Diagnosis not present

## 2024-02-25 NOTE — Patient Instructions (Signed)
-  It was a pleasure meeting you today -Unfortunately, I did not see a reason for the pain.  I suggest you follow-up with your dentist as the pain appears to be precipitated when you eat something hard suggesting a primarily dental cause -I do not suggest any antibiotics at this time as I see no signs of infection on examining your mouth -If you continue to have pain please follow-up with us  for further evaluation

## 2024-02-25 NOTE — Progress Notes (Signed)
 Acute Office Visit  Subjective:     Patient ID: Ashley [name], female    DOB: 26-Aug-1940, 83 y.o.   MRN: [id number]  Chief Complaint  Patient presents with   Acute Visit    Jaw pain x 1 week Jaw pain started after having 6 month dental cleaning and xrays Pain on right side of jaw when chewing on left side Can hear jaw movements in the left ear    HPI Patient is in today for right-sided jaw pain.  Patient states that a couple days after her 40-month dental cleaning she developed right-sided jaw pain which occasionally radiates up to her right ear and her right temple.  Patient states that the pain is precipitated when she eats something hard.  She has been chewing on the left side and avoiding harder foods.  She states that she has called her dentist twice and was told that there was not much they could do.  She was wondering if this could possibly be secondary to an ear issue.    Review of Systems  Constitutional: Negative.   HENT:  Negative for congestion, ear discharge, ear pain and sinus pain.        Right-sided jaw pain on eating hard food  Respiratory: Negative.    Cardiovascular: Negative.   Gastrointestinal: Negative.   Musculoskeletal: Negative.   Neurological: Negative.   Psychiatric/Behavioral: Negative.          Objective:    BP 122/76   Pulse 92   Ht 4' 11 (1.499 m)   Wt 141 lb 12.8 oz (64.3 kg)   SpO2 97%   BMI 28.64 kg/m    Physical Exam Constitutional:      Appearance: Normal appearance.  HENT:     Head: Normocephalic and atraumatic.     Ears:     Comments: Some earwax noted bilaterally but no discharge or evidence of otitis media noted    Mouth/Throat:     Pharynx: Oropharynx is clear. No oropharyngeal exudate.     Comments: No erythema or exudates noted in the oropharynx or in the gums Cardiovascular:     Rate and Rhythm: Normal rate and regular rhythm.     Heart sounds: Normal heart sounds.  Pulmonary:     Breath sounds: Normal breath  sounds. No wheezing or rales.  Abdominal:     Palpations: Abdomen is soft.  Musculoskeletal:     Cervical back: Neck supple.  Lymphadenopathy:     Cervical: No cervical adenopathy.  Neurological:     Mental Status: She is alert.  Psychiatric:        Mood and Affect: Mood normal.        Behavior: Behavior normal.     No results found for any visits on 02/25/24.      Assessment & Plan:   Problem List Items Addressed This Visit       Cardiovascular and Mediastinum   Essential hypertension (Chronic)   - This problem is chronic and stable -Patient blood pressure today is 122/76 -She is not currently on any medication -BMP done in June was normal -Will continue to monitor off medication for now        Other   Jaw pain - Primary   - Patient complained of right-sided jaw pain over the last 2 weeks -Pain began approximately 2 to 3 days after her dental cleaning -Patient states the pain is precipitated when she eats something hard and the patient radiates to her right  ear and her right temple -On exam, she has no erythema or exudates in her oral cavity.  No evidence of infection.  No clicking on opening closing her jaw.  No cervical lymphadenopathy noted -Patient did attempt to contact her dentist for this but they did not believe it is a dental issue -Given that the pain is precipitated when she eats something hard and is mainly in her right jaw I suspect that this is likely a dental issue -I advised patient to call her dentist again to see if he would see her and if not advised her to seek a second opinion from another -If pain persists despite dental evaluation would consider further   possible imaging -Continue with Advil for pain control -No further workup at this time       No orders of the defined types were placed in this encounter.   No follow-ups on file.  Facundo Allemand, MD

## 2024-02-25 NOTE — Assessment & Plan Note (Signed)
-   Patient complained of right-sided jaw pain over the last 2 weeks -Pain began approximately 2 to 3 days after her dental cleaning -Patient states the pain is precipitated when she eats something hard and the patient radiates to her right ear and her right temple -On exam, she has no erythema or exudates in her oral cavity.  No evidence of infection.  No clicking on opening closing her jaw.  No cervical lymphadenopathy noted -Patient did attempt to contact her dentist for this but they did not believe it is a dental issue -Given that the pain is precipitated when she eats something hard and is mainly in her right jaw I suspect that this is likely a dental issue -I advised patient to call her dentist again to see if he would see her and if not advised her to seek a second opinion from another -If pain persists despite dental evaluation would consider further   possible imaging -Continue with Advil for pain control -No further workup at this time

## 2024-02-25 NOTE — Assessment & Plan Note (Addendum)
-   This problem is chronic and stable -Patient blood pressure today is 122/76 -She is not currently on any medication -BMP done in June was normal -Will continue to monitor off medication for now

## 2024-02-27 NOTE — Telephone Encounter (Unsigned)
 Copied from CRM [id number]. Topic: General - Other >> Feb 27, 2024 12:00 PM Abigail D wrote: Reason for CRM: Patient is returning a call from [place] but will be waiting for a call back.

## 2024-02-28 NOTE — Telephone Encounter (Signed)
 Called and left message for patient to let her know that I had called before she was evaluated by Dr Onesimo. Advised she can call back if needed.

## 2024-04-03 ENCOUNTER — Ambulatory Visit (INDEPENDENT_AMBULATORY_CARE_PROVIDER_SITE_OTHER)

## 2024-04-03 VITALS — BP 122/74 | HR 95 | Temp 97.9°F | Ht 59.0 in | Wt 142.0 lb

## 2024-04-03 DIAGNOSIS — E782 Mixed hyperlipidemia: Secondary | ICD-10-CM

## 2024-04-03 DIAGNOSIS — M48061 Spinal stenosis, lumbar region without neurogenic claudication: Secondary | ICD-10-CM

## 2024-04-03 DIAGNOSIS — Z711 Person with feared health complaint in whom no diagnosis is made: Secondary | ICD-10-CM

## 2024-04-03 DIAGNOSIS — M5416 Radiculopathy, lumbar region: Secondary | ICD-10-CM | POA: Diagnosis not present

## 2024-04-03 DIAGNOSIS — R296 Repeated falls: Secondary | ICD-10-CM | POA: Diagnosis not present

## 2024-04-03 DIAGNOSIS — I10 Essential (primary) hypertension: Secondary | ICD-10-CM

## 2024-04-03 DIAGNOSIS — F39 Unspecified mood [affective] disorder: Secondary | ICD-10-CM | POA: Diagnosis not present

## 2024-04-03 DIAGNOSIS — R7303 Prediabetes: Secondary | ICD-10-CM

## 2024-04-03 DIAGNOSIS — R6884 Jaw pain: Secondary | ICD-10-CM

## 2024-04-03 DIAGNOSIS — R000 Tachycardia, unspecified: Secondary | ICD-10-CM

## 2024-04-03 MED ORDER — VENLAFAXINE HCL ER 150 MG PO CP24: 150.0000 mg | ORAL_CAPSULE | Freq: Every day | ORAL | 3 refills | Status: AC

## 2024-04-03 MED ORDER — ROSUVASTATIN CALCIUM 40 MG PO TABS: 40.0000 mg | ORAL_TABLET | Freq: Every day | ORAL | 3 refills | Status: AC

## 2024-04-03 MED ORDER — GABAPENTIN 100 MG PO CAPS: 100.0000 mg | ORAL_CAPSULE | Freq: Every day | ORAL | 1 refills | Status: AC

## 2024-04-03 NOTE — Assessment & Plan Note (Signed)
 Managed with Wellbutrin  and Effexor , continue. Church activities and widow support program beneficial for grief management. I also discussed CBT, patient declined.

## 2024-04-03 NOTE — Assessment & Plan Note (Signed)
 BP within goal today

## 2024-04-03 NOTE — Assessment & Plan Note (Signed)
 Chronic, symptoms mostly stable with Gabapentin  100 mg at night, continue. PDMP reviewed, refill sent.

## 2024-04-03 NOTE — Assessment & Plan Note (Signed)
-   Chronic, with worsening memory, repetitive speech, abnormal gait.   - Refer to home health for physical therapy evaluation to reduce fall risk. - Refer to neurology for evaluation of frequent falls and memory concerns. - Encourage adequate nutrition and hydration. 01/21/24 with normal B 12.  - Schedule follow-up in two months to reassess fall risk and overall health

## 2024-04-03 NOTE — Assessment & Plan Note (Signed)
 Exam reassuring today. No pain on palpation over b/l TMJ and temporal artery distribution.  Recommend gentle jaw exercises and warm compresses. Advise ibuprofen 400 mg every 12 hours with food, avoid other NSAIDs. Encourage eating soft foods and taking small bites. F/U if pain worsens.

## 2024-04-03 NOTE — Progress Notes (Signed)
 Established Patient Office Visit TOC from Dr. Eual   Subjective  Patient ID: Ashley [name], female    DOB: 15-Mar-1941  Age: 83 y.o. 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  HPI Discussed the use of AI scribe software for clinical note transcription with the patient, who gave verbal consent to proceed.  History of Present Illness Ashley [name] is an 83 year old female who presents with recurrent falls.   She has been experiencing recurrent falls, with the most recent incident occurring on Sunday after church. She attributes these falls to not eating enough, as she often skips meals in the morning and eats later in the day. She mentions an increase in the frequency of falls, including a previous fall on the sidewalk. She lives alone in an apartment and has supportive neighbors and a daughter who visits a couple of times a month.  She has been experiencing jaw pain, which she describes as starting again recently. She recalls seeing a dentist who advised her to take smaller bites and eat softer foods. She has been taking gabapentin , which she splits in half to avoid grogginess, and finds it helps with her TMJ pain and restless legs.  Her medication regimen includes aspirin , Wellbutrin  300 mg in the morning, calcium , vitamin D , vitamin C, cranberry juice, B12, an EpiPen , estrogen cream applied a couple of times a week, Flonase  for seasonal runny nose, gabapentin  at night, Advil as needed  for pain, magnesium , melatonin, omeprazole  for acid reflux, a stool softener, Crestor  40 mg at night, and Effexor . She has been on Effexor  for years.   She has a history of UTIs and takes a preventive over-the-counter medication daily. No current urinary symptoms and a recent urologist visit showed no blood in her urine. She has had a cystoscopy in the past, which showed no concerning findings.  She mentions a history of mini strokes and expresses some concern about her memory, attributing it to age and recent life stressors, including the death of her husband in 2022/08/13. She has been grieving but finds support through her church and a program for widows.  ROS As per HPI    Objective:     BP 122/74 (BP Location: Right Arm, Patient Position: Sitting, Cuff Size: Normal)   Pulse 95   Temp 97.9 F (36.6 C) (Oral)   Ht 4' 11 (1.499 m)   Wt 142 lb (64.4 kg)   SpO2 95%   BMI 28.68 kg/m      04/03/2024    3:11 PM 02/25/2024    1:13 PM 11/05/2023    1:44 PM  Depression screen PHQ 2/9  Decreased Interest 2 1 2   Down, Depressed, Hopeless 2 3 3   PHQ - 2 Score 4 4 5   Altered sleeping 0 0 1  Tired, decreased energy 1 2 1   Change in appetite 0 0 0  Feeling bad or failure about yourself  0 2 0  Trouble concentrating 0 0 0  Moving slowly or fidgety/restless 1 0 0  Suicidal thoughts 0 0 0  PHQ-9 Score 6 8  7  Difficult doing work/chores Somewhat difficult Somewhat difficult Not difficult at all      04/03/2024    3:12 PM 02/25/2024    1:13 PM 11/05/2023    1:44 PM 08/06/2023   10:30 AM  GAD 7 : Generalized Anxiety Score  Nervous, Anxious, on Edge 1 0 1 0  Control/stop worrying 0 0 2 0  Worry too much - different things 0 0 1 0  Trouble relaxing 0 0 1 0  Restless 1 0 0 0  Easily annoyed or irritable 0 0 0 0  Afraid - awful might happen 0 0 1 0  Total GAD 7 Score 2 0 6 0  Anxiety Difficulty Not difficult at all Not difficult at all Somewhat difficult Not difficult at all       04/03/2024    3:11 PM 02/25/2024    1:13 PM 11/05/2023    1:44 PM  Depression screen PHQ 2/9  Decreased Interest 2 1 2   Down, Depressed, Hopeless 2 3 3   PHQ - 2 Score 4 4 5   Altered sleeping 0 0 1  Tired, decreased energy 1 2 1   Change in appetite 0 0 0  Feeling bad or failure about yourself  0 2 0  Trouble concentrating 0 0 0  Moving slowly or fidgety/restless 1 0 0  Suicidal thoughts 0 0 0  PHQ-9 Score 6 8 7   Difficult doing work/chores Somewhat difficult Somewhat difficult Not difficult at all      04/03/2024    3:12 PM 02/25/2024    1:13 PM 11/05/2023    1:44 PM 08/06/2023   10:30 AM  GAD 7 : Generalized Anxiety Score  Nervous, Anxious, on Edge 1 0 1 0  Control/stop worrying 0 0 2 0  Worry too much - different things 0 0 1 0  Trouble relaxing 0 0 1 0  Restless 1 0 0 0  Easily annoyed or irritable 0 0 0 0  Afraid - awful might happen 0 0 1 0  Total GAD 7 Score 2 0 6 0  Anxiety Difficulty Not difficult at all Not difficult at all Somewhat difficult Not difficult at all   SDOH Screenings   Food Insecurity: No Food Insecurity (09/24/2023)   Received from [facility]  Housing: Unknown (09/24/2023)   Received from [facility]  Transportation Needs: No Transportation Needs (09/24/2023)   Received from [facility]  Utilities: Not At Risk (09/24/2023)   Received from [facility]  Alcohol  Screen: Low Risk  (12/18/2022)  Depression (PHQ2-9): Medium Risk (04/03/2024)  Financial Resource Strain: Low Risk  (09/24/2023)   Received from [facility]  Physical Activity: Insufficiently Active (12/18/2022)  Social Connections: Moderately Integrated (12/18/2022)  Stress: No Stress Concern Present (12/18/2022)  Tobacco Use: Medium Risk (04/03/2024)     Physical Exam HENT:     Head: Normocephalic and atraumatic.     Comments: No palpation over b/l TMJ or palpation over temporal artery distribution      Right Ear: Tympanic membrane and external ear normal.     Left Ear: Tympanic membrane and external ear normal.     Mouth/Throat:     Mouth: Mucous membranes are moist.  Eyes:     Pupils: Pupils are equal, round, and reactive to light.  Cardiovascular:     Rate and Rhythm: Normal rate.  Pulmonary:     Effort: Pulmonary effort is normal.     Breath sounds:  Normal breath sounds.  Abdominal:     Palpations: Abdomen is soft.     Tenderness: There is no guarding or rebound.  Musculoskeletal:     Cervical back: Neck supple.     Right lower leg: No edema.     Left lower leg: No edema.  Skin:    General: Skin is warm.  Neurological:     Mental Status: She is alert and oriented to person, place, and time.     Cranial Nerves: No facial asymmetry.     Sensory: Sensation is intact.     Gait: Gait abnormal and tandem walk abnormal.     Comments: Repetitive speech  Psychiatric:        Mood and Affect: Mood normal. Affect is tearful (talking about her husband who is deceased). Affect is not flat.        No results found for any visits on 04/03/24.  The ASCVD Risk score (Arnett DK, et al., 2019) failed to calculate for the following reasons:   The 2019 ASCVD risk score is only valid for ages 89 to 30   Risk score cannot be calculated because patient has a medical history suggesting prior/existing ASCVD     Assessment & Plan:    Recurrent falls Assessment & Plan: - Chronic with more recurrent fall. Has noted benefit from PT in the past.  - Refer to home health for physical therapy evaluation to reduce fall risk. - Refer to neurology for evaluation of frequent falls and memory concerns. - Encourage adequate nutrition and hydration. 01/21/24 with normal B 12.  - Schedule follow-up in two months to reassess fall risk and overall health.  Orders: -     Ambulatory referral to Home Health -     Ambulatory referral to Neurology  Spinal stenosis of lumbar region with  radiculopathy Assessment & Plan: Chronic, symptoms mostly stable with Gabapentin  100 mg at night, continue. PDMP reviewed, refill sent.   Orders: -     Gabapentin ; Take 1 capsule (100 mg total) by mouth at bedtime.  Dispense: 90 capsule; Refill: 1  Mixed hyperlipidemia -     Rosuvastatin  Calcium ; Take 1 tablet (40 mg total) by mouth daily.  Dispense: 90 tablet; Refill: 3  Mood disorder (HCC) Assessment & Plan: Managed with Wellbutrin  and Effexor , continue. Church activities and widow support program beneficial for grief management. I also discussed CBT, patient declined.    Orders: -     Venlafaxine  HCl ER; Take 1 capsule (150 mg total) by mouth daily.  Dispense: 90 capsule; Refill: 3  Essential hypertension Assessment & Plan: BP within goal today    Concern about memory Assessment & Plan: - Chronic, with worsening memory, repetitive speech, abnormal gait.   - Refer to home health for physical therapy evaluation to reduce fall risk. - Refer to neurology for evaluation of frequent falls and memory concerns. - Encourage adequate nutrition and hydration. 01/21/24 with normal B 12.  - Schedule follow-up in two months to reassess fall risk and overall health  Orders: -     Ambulatory referral to Neurology  Jaw pain Assessment & Plan: Exam reassuring today. No pain on palpation over b/l TMJ and temporal artery distribution.  Recommend gentle jaw exercises and warm compresses. Advise ibuprofen 400 mg every 12 hours with food, avoid other NSAIDs. Encourage eating soft foods and taking small bites. F/U if pain worsens.       Return in about 2 months (around 06/03/2024) for Chronic follow up .  Luke Shade, MD

## 2024-04-03 NOTE — Assessment & Plan Note (Signed)
-   Chronic with more recurrent fall. Has noted benefit from PT in the past.  - Refer to home health for physical therapy evaluation to reduce fall risk. - Refer to neurology for evaluation of frequent falls and memory concerns. - Encourage adequate nutrition and hydration. 01/21/24 with normal B 12.  - Schedule follow-up in two months to reassess fall risk and overall health.

## 2024-04-03 NOTE — Patient Instructions (Addendum)
-   To support your health as you age, focus on eating a variety of nutrient-rich foods. Include plenty of fruits, vegetables, whole grains, lean proteins (like fish, poultry, beans, and nuts), and low-fat dairy. Aim for enough protein to help maintain muscle, and choose foods high in calcium  and vitamin D  for bone health. Limit added sugars, salt, and saturated fats. Drink water regularly, and try to stay active. If you have trouble chewing or preparing food, let us  know so we can help you find suitable options.  - To help prevent falls, keep your home well-lit and free of clutter, use supportive footwear, and consider using handrails or assistive devices as needed. I am referring home health for physical therapy at home. I am also referring you to neurology for evaluation for memory concerns.   - For TMJ pain, try gentle jaw exercises, apply warm compresses, and eat soft foods to reduce strain on your jaw. If pain persists, over-the-counter anti-inflammatory medications like Ibuprofen no more than 400 mg every 12 hourly with food to help reduce side effects.    - I want to see you in 8 weeks and will get labs at that time.

## 2024-04-16 ENCOUNTER — Telehealth: Payer: Self-pay

## 2024-04-16 DIAGNOSIS — R2689 Other abnormalities of gait and mobility: Secondary | ICD-10-CM

## 2024-04-16 DIAGNOSIS — R296 Repeated falls: Secondary | ICD-10-CM

## 2024-04-16 NOTE — Telephone Encounter (Signed)
 Copied from CRM #[id number]. Topic: Clinical - Home Health Verbal Orders >> Apr 16, 2024  9:53 AM Wyona P wrote: Caller/Agency: [facility] Callback Number: [phone] - secured Line  Service Requested: Physical Therapy  Any new concerns about the patient? Yes, went to do Home Health - Pt is not home bound and does not qualify for Home Health. if can forward to [facility]   Phone: [phone]

## 2024-04-17 NOTE — Telephone Encounter (Signed)
 Left message for patient to give our office a call back to discuss Dr Graylon recommendations in regards to Home Health.  OK for E2C2 to relay note if pt calls back. If relayed, please notify the office.

## 2024-04-17 NOTE — Addendum Note (Signed)
 Addended by: Tiffanyann Deroo on: 04/17/2024 07:48 AM   Modules accepted: Orders

## 2024-04-17 NOTE — Telephone Encounter (Signed)
 Please let the patient know we got a message from [facility] stating patient does not qualify for home health, is she interested to go to [facility] out patient physical therapy for balance? If she is please sign the referral pended. If she would like to try a different physical therapy please adjust referral accordingly.   Thank you,  Luke Shade, MD

## 2024-04-23 NOTE — Telephone Encounter (Signed)
 Left message for patient to give our office a call back to discuss Dr Graylon recommendations in regards to Home Health.  OK for E2C2 to relay note if pt calls back. If relayed, please notify the office.

## 2024-06-04 ENCOUNTER — Ambulatory Visit

## 2024-06-04 VITALS — BP 128/90 | HR 101 | Temp 99.3°F | Ht <= 58 in | Wt 140.4 lb

## 2024-06-04 DIAGNOSIS — R000 Tachycardia, unspecified: Secondary | ICD-10-CM

## 2024-06-04 DIAGNOSIS — M26643 Arthritis of bilateral temporomandibular joint: Secondary | ICD-10-CM | POA: Insufficient documentation

## 2024-06-04 DIAGNOSIS — R350 Frequency of micturition: Secondary | ICD-10-CM

## 2024-06-04 DIAGNOSIS — I10 Essential (primary) hypertension: Secondary | ICD-10-CM

## 2024-06-04 DIAGNOSIS — R296 Repeated falls: Secondary | ICD-10-CM

## 2024-06-04 DIAGNOSIS — R2689 Other abnormalities of gait and mobility: Secondary | ICD-10-CM

## 2024-06-04 MED ORDER — METOPROLOL TARTRATE 25 MG PO TABS: 12.5000 mg | ORAL_TABLET | Freq: Every day | ORAL | 1 refills | Status: AC

## 2024-06-04 NOTE — Assessment & Plan Note (Addendum)
 Increased urinary frequency with recurrent UTIs. Check UA and urine culture. Management pending results.  Orders:   Urinalysis, Routine w reflex microscopic   Urine Culture

## 2024-06-04 NOTE — Assessment & Plan Note (Addendum)
 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 Over-the-counter mouth guard ineffective.  Recommend ibuprofen 400 mg twice a day with food as needed for severe pain. Advise use of heating pad for pain relief.

## 2024-06-04 NOTE — Assessment & Plan Note (Addendum)
 Plan per recurrent falls.  Orders:   Ambulatory referral to Physical Therapy

## 2024-06-04 NOTE — Assessment & Plan Note (Signed)
 Blood pressure 140/90 mmHg, heart rate elevated at 101/min. Recommend starting Metoprolol  12.5 mg once daily for blood pressure and heart rate control. Monitor blood pressure at home and bring readings to follow-up appointment. Zio monitoring to evaluate for potential arrhythmia.

## 2024-06-04 NOTE — Patient Instructions (Addendum)
-   Take Ibuprofen 400 mg twice a day as needed with food to help with TMJ pain.  - We will do heart monitor to rule out heart problem contributing to recurrent fall.  - I have put a referral to physical therapy to help with balance.  - I will update you on lab results for urine once I have that back.  - Start Metoprolol  12.5 mg (1/2 tablet of 25 mg at bedtime).  Check BP at home, please bring home BP reading during follow up.  - Follow up with Dr. Abbey in January.

## 2024-06-04 NOTE — Assessment & Plan Note (Addendum)
 Recurrent falls with abnormal gait. Insurance declined home health physical therapy as patient is able to do ADLs and lives independently.  Refer to outpatient physical therapy for balance improvement ( [facility] upon patient's request). Advise use of walker to prevent falls. Instruct to be cautious when moving from sitting to standing and to hold onto surfaces when walking. Order Zio heart monitor to evaluate for cardiac causes for fall.  Memory concerns to be evaluated in upcoming neurology appointment in December. Orders:   Ambulatory referral to Physical Therapy   LONG TERM MONITOR (3-14 DAYS); Future

## 2024-06-04 NOTE — Assessment & Plan Note (Signed)
 Chronic, mildly elevated HR. Plan per essential htn. Further management pending results.  Orders:   LONG TERM MONITOR (3-14 DAYS); Future

## 2024-06-04 NOTE — Progress Notes (Signed)
 Established Patient Office Visit   Subjective  Patient ID: Ashley [name], female    DOB: Jan 22, 1941  Age: 83 y.o. MRN: [id number]  Chief Complaint  Patient presents with   Fall   Urinary Tract Infection    Discussed the use of AI scribe software for clinical note transcription with the patient, who gave verbal consent to proceed.  History of Present Illness Ashley [name] is an 83 year old female who presents for chronic medication management. She is accompanied by her daughter, Tawni.  She experienced a fall today while walking, possibly due to slipping, though she is unsure of the exact cause. She has a history of recurrent falls. A neurology appointment is scheduled for December. She was referred to home health for physical therapy. Insurance declined. She is interested in outpatient physical therapy to improve balance.    She has a history of urinary tract infections (UTIs) that were persistent for about a year, affecting her gait and speech. She currently takes an over-the-counter supplement, to help prevent UTIs. She reports increased urinary frequency and urgency, and her daughter notes that she 'pees a lot.'  She has been diagnosed with arthritis in her jaw, confirmed by an MRI recently. However, she has not been using it consistently due to cost concerns. She takes ibuprofen 400 mg twice a day with food as needed for pain, but is cautious due to her kidney function.  She has a history of high heart rate usually around 102-105/min, which she notes is not unusual for her. Her blood pressure is often around 140/90.   She lives alone and is able to perform most daily activities independently, except for tasks that require reaching high places. She experiences back pain when doing housework such as sweeping and dusting.    ROS As per HPI    Objective:     BP (!) 128/90 (BP Location: Right Arm, Patient Position: Sitting, Cuff Size: Normal)   Pulse (!) 101   Temp  99.3 F (37.4 C) (Oral)   Ht 4' 9 (1.448 m)   Wt 140 lb 6.4 oz (63.7 kg)   SpO2 95%   BMI 30.38 kg/m      04/03/2024    3:11 PM 02/25/2024    1:13 PM 11/05/2023    1:44 PM  Depression screen PHQ 2/9  Decreased Interest 2 1 2   Down, Depressed, Hopeless 2 3 3   PHQ - 2 Score 4 4 5   Altered sleeping 0 0 1  Tired, decreased energy 1 2 1   Change in appetite 0 0 0  Feeling bad or failure about yourself  0 2 0  Trouble concentrating 0 0 0  Moving slowly or fidgety/restless 1 0 0  Suicidal thoughts 0 0 0  PHQ-9 Score 6 8 7   Difficult doing work/chores Somewhat difficult Somewhat difficult Not difficult at all      04/03/2024    3:12 PM 02/25/2024    1:13 PM 11/05/2023    1:44 PM 08/06/2023   10:30 AM  GAD 7 : Generalized Anxiety Score  Nervous, Anxious, on Edge 1 0 1 0  Control/stop worrying 0 0 2 0  Worry too much - different things 0 0 1 0  Trouble relaxing 0 0 1 0  Restless 1 0 0 0  Easily annoyed or irritable 0 0 0 0  Afraid - awful might happen 0 0 1 0  Total GAD 7 Score 2 0 6 0  Anxiety Difficulty Not difficult at  all Not difficult at all Somewhat difficult Not difficult at all      04/03/2024    3:11 PM 02/25/2024    1:13 PM 11/05/2023    1:44 PM  Depression screen PHQ 2/9  Decreased Interest 2 1 2   Down, Depressed, Hopeless 2 3 3   PHQ - 2 Score 4 4 5   Altered sleeping 0 0 1  Tired, decreased energy 1 2 1   Change in appetite 0 0 0  Feeling bad or failure about yourself  0 2 0  Trouble concentrating 0 0 0  Moving slowly or fidgety/restless 1 0 0  Suicidal thoughts 0 0 0  PHQ-9 Score 6 8 7   Difficult doing work/chores Somewhat difficult Somewhat difficult Not difficult at all      04/03/2024    3:12 PM 02/25/2024    1:13 PM 11/05/2023    1:44 PM 08/06/2023   10:30 AM  GAD 7 : Generalized Anxiety Score  Nervous, Anxious, on Edge 1 0 1 0  Control/stop worrying 0 0 2 0  Worry too much - different things 0 0 1 0  Trouble relaxing 0 0 1 0  Restless 1 0 0 0   Easily annoyed or irritable 0 0 0 0  Afraid - awful might happen 0 0 1 0  Total GAD 7 Score 2 0 6 0  Anxiety Difficulty Not difficult at all Not difficult at all Somewhat difficult Not difficult at all   SDOH Screenings   Food Insecurity: No Food Insecurity (09/24/2023)   Received from [facility]  Housing: Unknown (09/24/2023)   Received from [facility]  Transportation Needs: No Transportation Needs (09/24/2023)   Received from [facility]  Utilities: Not At Risk (09/24/2023)   Received from [facility]  Alcohol  Screen: Low Risk  (12/18/2022)  Depression (PHQ2-9): Medium Risk (04/03/2024)  Financial Resource Strain: Low Risk  (09/24/2023)   Received from [facility]  Physical Activity: Insufficiently Active (12/18/2022)  Social Connections: Moderately Integrated (12/18/2022)  Stress: No Stress Concern Present (12/18/2022)  Tobacco Use: Medium Risk (06/04/2024)     Physical Exam HENT:     Head: Normocephalic and atraumatic.     Mouth/Throat:     Mouth: Mucous membranes are moist.  Cardiovascular:     Rate and Rhythm: Regular rhythm. Tachycardia present.     Heart sounds: No murmur heard. Pulmonary:     Effort: Pulmonary effort is normal.     Breath sounds: Normal breath sounds.  Abdominal:     General: There is no distension.     Tenderness: There is no guarding.  Musculoskeletal:     Cervical back: Neck supple.     Right lower leg: No edema.     Left lower leg: No edema.  Lymphadenopathy:     Cervical: No cervical adenopathy.  Neurological:     Mental Status: She is alert and oriented to person, place, and time.     Gait: Gait abnormal and tandem walk abnormal.  Psychiatric:        Mood and Affect: Mood normal.        Behavior: Behavior normal.        No results found for any visits on 06/04/24.  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 Over-the-counter mouth guard ineffective.  Recommend ibuprofen 400 mg twice a day with food as needed for severe pain. Advise use of heating pad for pain relief.    Balance problem Plan per recurrent falls.  Orders:   Ambulatory referral to Physical Therapy  Recurrent falls Recurrent falls with abnormal gait. Insurance declined home health physical therapy as patient is able to do ADLs and lives independently.  Refer to outpatient physical therapy for balance improvement ( [facility] upon patient's request). Advise use of walker to prevent falls. Instruct to be cautious when moving from sitting to standing and to hold onto surfaces when walking. Order Zio heart monitor to evaluate for cardiac causes for fall.  Memory concerns to be evaluated in upcoming neurology appointment in December. Orders:   Ambulatory referral to Physical Therapy   LONG TERM MONITOR (3-14 DAYS); Future  Urinary frequency Increased urinary frequency with recurrent UTIs. Check UA and urine culture. Management pending results.  Orders:   Urinalysis, Routine w reflex microscopic   Urine Culture  Essential hypertension Blood pressure 140/90 mmHg, heart rate elevated at 101/min. Recommend starting Metoprolol  12.5 mg once daily for blood pressure and heart rate control. Monitor blood pressure at home and bring readings to follow-up appointment. Zio monitoring to evaluate for potential arrhythmia.      Tachycardia Chronic, mildly elevated HR. Plan per essential htn. Further management pending results.  Orders:   LONG TERM MONITOR (3-14 DAYS); Future   Return in about 10 weeks (around 08/13/2024) for Chronic  follow up .   Luke Shade, MD

## 2024-06-05 ENCOUNTER — Ambulatory Visit

## 2024-06-05 DIAGNOSIS — R296 Repeated falls: Secondary | ICD-10-CM

## 2024-06-05 LAB — URINALYSIS, ROUTINE W REFLEX MICROSCOPIC
Bilirubin Urine: NEGATIVE
Ketones, ur: NEGATIVE
Leukocytes,Ua: NEGATIVE
Nitrite: NEGATIVE
Specific Gravity, Urine: 1.01 (ref 1.000–1.030)
Total Protein, Urine: NEGATIVE
Urine Glucose: NEGATIVE
Urobilinogen, UA: 0.2 (ref 0.0–1.0)
pH: 7.5 (ref 5.0–8.0)

## 2024-06-06 ENCOUNTER — Ambulatory Visit: Payer: Self-pay

## 2024-06-06 DIAGNOSIS — N390 Urinary tract infection, site not specified: Secondary | ICD-10-CM

## 2024-06-06 LAB — URINE CULTURE
MICRO NUMBER:: 17164052
SPECIMEN QUALITY:: ADEQUATE

## 2024-06-06 MED ORDER — SULFAMETHOXAZOLE-TRIMETHOPRIM 800-160 MG PO TABS: 1.0000 | ORAL_TABLET | Freq: Two times a day (BID) | ORAL | 0 refills | Status: AC

## 2024-06-06 NOTE — Progress Notes (Signed)
 Please let the patient know her urine culture shows urinary tract infection with bacteria called E. Coli. Recommend treatment with antibiotic called Bactrim , take twice a day for 3 days. Antibiotic sent to the pharmacy.   Thank you, Luke Shade, MD

## 2024-06-11 ENCOUNTER — Ambulatory Visit

## 2024-06-12 ENCOUNTER — Ambulatory Visit: Payer: Self-pay

## 2024-06-12 ENCOUNTER — Other Ambulatory Visit: Payer: Self-pay

## 2024-06-12 DIAGNOSIS — J309 Allergic rhinitis, unspecified: Secondary | ICD-10-CM

## 2024-06-12 NOTE — Telephone Encounter (Signed)
Received notification of un-viewed results and result message from provider on Mychart. Generated letter with results and result message and mailed to patient.  

## 2024-06-12 NOTE — Telephone Encounter (Signed)
 Left detailed message for patient on her cell voicemail to make aware of Dr Graylon recommendations. Patient advised to give our office a call back to discuss.  OK for E2C2 to give note if patient calls back. If relayed, please notify the office.

## 2024-06-12 NOTE — Telephone Encounter (Signed)
 Can schedule nurse visit to do help place zio.  Recommend next office visit daughter waits outside in the lobby when patient comes to see us  on 06/19/24.   Thank you,  Luke Shade, MD

## 2024-06-12 NOTE — Telephone Encounter (Signed)
 FYI Only or Action Required?: FYI only for provider: appointment scheduled on 06/19/24.  Patient was last seen in primary care on 06/04/2024 by Abbey Bruckner, MD.  Called Nurse Triage reporting Depression.  Symptoms began several years ago.  Interventions attempted: Prescription medications: Venlafaxine .  Symptoms are: stable.  Triage Disposition: See PCP When Office is Open (Within 3 Days)  Patient/caregiver understands and will follow disposition?: Yes Reason for Disposition  Requesting to talk with a counselor (mental health worker, psychiatrist, etc.)  Answer Assessment - Initial Assessment Questions Patient states stays busy with the church, but feels like she needs a little push to do things. Patient states she is looking for medication, states been taking Venlafaxine  for years but is looking for something to add to it or switch. Has not talked to current PCP about these feelings because daughter was with her, and she does not want to share these feelings in front of her doctor. Patient brought up seeing a counselor and might want to explore that option as well.   1. CONCERN: What happened that made you call today?     Bouts of depression since husband passing 2 years ago, states the longer I go with out him, the more I don't care about anything  2. DEPRESSION SYMPTOM SCREENING: How are you feeling overall? (e.g., decreased energy, increased sleeping or difficulty sleeping, difficulty concentrating, feelings of sadness, guilt, hopelessness, or worthlessness)     Don't care about anything  3. RISK OF HARM - SUICIDAL IDEATION:  Do you ever have thoughts of hurting or killing yourself?  (e.g., yes, no, no but preoccupation with thoughts about death)     Denies  4. SUPPORT: Who is with you now? Who do you live with? Do you have family or friends who you can talk to?      States daughter is over protective of her so does not want to talk about it in front of  her.  Answer Assessment - Initial Assessment Questions Patient states she cannot understand the pamphlet and how to put on the monitor and states wants to sit down with someone face to face to put on the monitor. Please advise for an appointment for this for patient. Requesting cb: [phone]   1. REASON FOR CALL: What is the main reason for your call? or How can I best help you?     Confused about Zio monitor and how to put on  Protocols used: Depression-A-AH, Information Only Call - No Triage-A-AH  Copied from CRM [id number]. Topic: Clinical - Red Word Triage >> Jun 12, 2024  2:40 PM Berneda FALCON wrote: Red Word that prompted transfer to Nurse Triage: Pt states she was instructed by PCP to wear a cardiac monitor but she is not sure how to use it and was only given a pamphlet which is hard for her to understand. Is unsure if she needs an appt to find out how to use it or not.  Additionally, the more concerning notation is that patient expresses she has been struggling with bouts of depression since husband's passing 2 years ago. She has not talked to PCP about this because her daughter has been coming to the appts with her and she does not want to mention this in front of her in fear it would upset her.  Pt would like to speak to a nurse to discuss her thoughts and see what options we could do to help her through these emotions

## 2024-06-19 ENCOUNTER — Encounter: Payer: Self-pay | Admitting: Nurse Practitioner

## 2024-06-19 ENCOUNTER — Telehealth: Payer: Self-pay

## 2024-06-19 ENCOUNTER — Ambulatory Visit (INDEPENDENT_AMBULATORY_CARE_PROVIDER_SITE_OTHER): Admitting: Nurse Practitioner

## 2024-06-19 VITALS — BP 124/78 | HR 89 | Temp 98.0°F | Ht <= 58 in | Wt 139.0 lb

## 2024-06-19 DIAGNOSIS — F32A Depression, unspecified: Secondary | ICD-10-CM | POA: Diagnosis not present

## 2024-06-19 DIAGNOSIS — R296 Repeated falls: Secondary | ICD-10-CM | POA: Diagnosis not present

## 2024-06-19 DIAGNOSIS — B9620 Unspecified Escherichia coli [E. coli] as the cause of diseases classified elsewhere: Secondary | ICD-10-CM

## 2024-06-19 DIAGNOSIS — F419 Anxiety disorder, unspecified: Secondary | ICD-10-CM

## 2024-06-19 NOTE — Telephone Encounter (Signed)
 Talked with the pharmacist at [facility], verbal order given for Bactrim  DS, BID for 3 days for E coli UTI.   VM left to the patient with update as well.   Closing this encounter.    Luke Shade, MD

## 2024-06-19 NOTE — Progress Notes (Signed)
 Established Patient Office Visit  Subjective:  Patient ID: Ashley [name], female    DOB: 06/06/1941  Age: 83 y.o. MRN: [id number]  CC:  Chief Complaint  Patient presents with   Acute Visit    Depression   Discussed the use of AI scribe software for clinical note transcription with the patient, who gave verbal consent to proceed.  History of Present Illness Ashley [name] is an 83 year old female who presents with intermittent bouts of depression following her husband's death.  She experiences intermittent depression since her husband's death two years ago. She takes Effexor  and Wellbutrin  for depression and anxiety. She seeks additional therapy and finds support through her church, including a group for widows and recent baptism.  Her husband died in 2022-09-12. She was married for fifty years, and she was his primary caregiver.  She lives alone with her cat and has experienced recent falls, currently monitored with a two-week device. She drives and manages her home independently. No thoughts of self-harm.     06/19/2024    8:48 AM 04/03/2024    3:11 PM 02/25/2024    1:13 PM  Depression screen PHQ 2/9  Decreased Interest 2 2 1   Down, Depressed, Hopeless 1 2 3   PHQ - 2 Score 3 4 4   Altered sleeping 0 0 0  Tired, decreased energy 2 1 2   Change in appetite 0 0 0  Feeling bad or failure about yourself  0 0 2  Trouble concentrating 0 0 0  Moving slowly or fidgety/restless 0 1 0  Suicidal thoughts 0 0 0  PHQ-9 Score 5 6  8    Difficult doing work/chores Somewhat difficult Somewhat difficult Somewhat difficult     Data saved with a previous flowsheet row definition     Past Medical History:  Diagnosis Date   Abnormal uterine bleeding (AUB)    a. benign endometrial polyps on D+C in 03/2017   Anxiety    Arthritis    Carpal tunnel syndrome of left wrist    Cerebral hemorrhage (HCC) 2023   Chicken pox    Depression    GERD (gastroesophageal reflux disease)    History  of stress test    a. 2017/09/12 Lexiscan  MV: EF 55-65%. No ischemia/infarct. Low risk.   Hyperlipidemia    Hypertension    Lacunar infarction (HCC)    a. Dx in setting of dizziness/vertigo; b. 02/2018 Carotid U/S: bilateral intimal thickening and atherosclerotic plaque. No hemodynamically significant stenoses; c. 03/2018 MRI Brain: No mass or abnl enhancement. Chronic microvascular ischemic changes and mod volume loss of the brain. 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   Stroke (HCC)    PT STATES SHE WAS TOLD THIS BY HER PCP BUT PT STATES SHE NEVER KNEW SHE HAD A STROKE-NO RESIDUAL EFFECTS   Vertigo     Past Surgical History:  Procedure Laterality Date   BACK SURGERY  11/19/2017   lumbar laminectomy   CARPAL TUNNEL RELEASE Right 07/30/2019   CARPAL TUNNEL RELEASE Left 02/21/2021   Procedure: CARPAL TUNNEL RELEASE;  Surgeon: Mardee Lynwood SQUIBB, MD;  Location: [facility];  Service: Orthopedics;  Laterality: Left;   COLONOSCOPY     2003, 2011, 2021   COLONOSCOPY WITH PROPOFOL  N/A 12/24/2019   Procedure: COLONOSCOPY WITH PROPOFOL ;  Surgeon: Aundria, Teodoro K, MD;  Location:  [facility];  Service: Gastroenterology;  Laterality: N/A;   CYSTOSCOPY MACROPLASTIQUE IMPLANT N/A 05/15/2019   Procedure: CYSTOSCOPY MACROPLASTIQUE IMPLANT;  Surgeon: Kassie Ozell SAUNDERS, MD;  Location: [facility];  Service: Urology;  Laterality: N/A;   DILATATION & CURETTAGE/HYSTEROSCOPY WITH MYOSURE N/A 03/30/2017   Procedure: DILATATION & CURETTAGE/HYSTEROSCOPY WITH MYOSURE;  Surgeon: Schermerhorn, Debby PARAS, MD;  Location: [facility];  Service: Gynecology;  Laterality: N/A;   EYE SURGERY Bilateral    Cataract Extraction with IOL   JOINT REPLACEMENT     KNEE ARTHROPLASTY Left 12/01/2020   Procedure: COMPUTER ASSISTED TOTAL KNEE ARTHROPLASTY;  Surgeon: Mardee Lynwood SQUIBB, MD;   Location: [facility];  Service: Orthopedics;  Laterality: Left;   SKIN LESION EXCISION Right    synovial cyst right AC joint   TONSILLECTOMY AND ADENOIDECTOMY     Age 75    Family History  Problem Relation Age of Onset   Rheum arthritis Mother    Ovarian cancer Sister    COPD Sister    CAD Sister    Heart disease Father    Aneurysm Father    Cancer Brother    Thrombosis Maternal Grandmother        a. DVT   Thrombosis Maternal Grandfather        a. DVT   Thrombosis Paternal Grandmother        a. DVT   Thrombosis Paternal Grandfather        a. DVT    Social History   Socioeconomic History   Marital status: Widowed    Spouse name: Emil   Number of children: 1   Years of education: Not on file   Highest education level: Bachelor's degree (e.g., BA, AB, BS)  Occupational History   Not on file  Tobacco Use   Smoking status: Former    Current packs/day: 0.00    Average packs/day: 0.5 packs/day for 15.0 years (7.5 ttl pk-yrs)    Types: Cigarettes    Start date: 08/07/1961    Quit date: 08/07/1976    Years since quitting: 47.8   Smokeless tobacco: Never  Vaping Use   Vaping status: Never Used  Substance and Sexual Activity   Alcohol  use: Yes    Alcohol /week: 1.0 standard drink of alcohol     Types: 1 Glasses of wine per week    Comment: ocassionally   Drug use: No   Sexual activity: Not Currently  Other Topics Concern   Not on file  Social History Narrative   03/07/22 Married    Social Drivers of Health   Financial Resource Strain: Low Risk  (09/24/2023)   Received from [facility]   Overall Financial Resource Strain (CARDIA)    Difficulty of Paying Living Expenses: Not hard at all  Food Insecurity: No Food Insecurity (09/24/2023)   Received from [facility]   Hunger Vital Sign    Within the past 12 months, you worried that your food would run out before you got the money to buy more.: Never true    Within the past 12 months, the  food you bought just didn't last and you didn't have money to get more.: Never true  Transportation Needs: No Transportation Needs (09/24/2023)   Received from [facility] - Transportation    In the past 12 months, has lack of transportation kept you from medical appointments or from getting medications?: No    Lack of Transportation (Non-Medical): No  Physical Activity: Insufficiently Active (12/18/2022)  Exercise Vital Sign    Days of Exercise per Week: 7 days    Minutes of Exercise per Session: 20 min  Stress: No Stress Concern Present (12/18/2022)   [employer] of Occupational Health - Occupational Stress Questionnaire    Feeling of Stress : Not at all  Social Connections: Moderately Integrated (12/18/2022)   Social Connection and Isolation Panel    Frequency of Communication with Friends and Family: More than three times a week    Frequency of Social Gatherings with Friends and Family: More than three times a week    Attends Religious Services: More than 4 times per year    Active Member of [employer] or Organizations: Yes    Attends Banker Meetings: More than 4 times per year    Marital Status: Widowed  Intimate Partner Violence: Not At Risk (12/18/2022)   Humiliation, Afraid, Rape, and Kick questionnaire    Fear of Current or Ex-Partner: No    Emotionally Abused: No    Physically Abused: No    Sexually Abused: No     Outpatient Medications Prior to Visit  Medication Sig Dispense Refill   aspirin  EC 81 MG tablet Take 81 mg by mouth daily. Swallow whole.     buPROPion  (WELLBUTRIN  XL) 300 MG 24 hr tablet Take 1 tablet (300 mg total) by mouth daily. 90 tablet 3   cholecalciferol (VITAMIN D3) 25 MCG (1000 UNIT) tablet Take 1 tablet (1,000 Units total) by mouth daily. 90 tablet 3   Cranberry-Vitamin C-Probiotic (AZO CRANBERRY PO) Take by mouth.     cyanocobalamin  (VITAMIN B12) 1000 MCG tablet Take 1 tablet (1,000 mcg total) by mouth daily.  90 tablet 3   EPINEPHrine  (EPIPEN  2-PAK) 0.3 mg/0.3 mL IJ SOAJ injection Inject 0.3 mLs (0.3 mg total) into the muscle as needed for anaphylaxis. 1 each 1   estradiol  (ESTRACE ) 0.1 MG/GM vaginal cream Estrogen Cream Instruction Discard applicator Apply pea sized amount to tip of finger to urethra before bed. Wash hands well after application. Use Monday, Wednesday and Friday 42.5 g 12   fluticasone  (FLONASE ) 50 MCG/ACT nasal spray PLACE ONE (1) TO TWO (2) SPRAYS INTO BOTH NOSTRILS DAILY AS DIRECTED 16 g 2   gabapentin  (NEURONTIN ) 100 MG capsule Take 1 capsule (100 mg total) by mouth at bedtime. 90 capsule 1   ibuprofen (ADVIL) 100 MG tablet Take 100 mg by mouth every 6 (six) hours as needed for fever (Patient takes 3 tablets a day).     Magnesium  500 MG CAPS Take by mouth.     Melatonin 10 MG TABS Take 10 mg by mouth at bedtime.     metoprolol  tartrate (LOPRESSOR ) 25 MG tablet Take 0.5 tablets (12.5 mg total) by mouth daily. 45 tablet 1   omeprazole  (PRILOSEC) 20 MG capsule Take 1 capsule (20 mg total) by mouth daily. 90 capsule 3   Polyethyl Glycol-Propyl Glycol (SYSTANE) 0.4-0.3 % SOLN Place 1 drop into both eyes 3 (three) times daily as needed (dry/irritated eyes.).     rosuvastatin  (CRESTOR ) 40 MG tablet Take 1 tablet (40 mg total) by mouth daily. 90 tablet 3   TURMERIC PO Take by mouth.     venlafaxine  XR (EFFEXOR -XR) 150 MG 24 hr capsule Take 1 capsule (150 mg total) by mouth daily. 90 capsule 3   No facility-administered medications prior to visit.    Allergies  Allergen Reactions   Ace Inhibitors Other (See Comments)    Angioedema   Pravastatin Other (See Comments)  Other reaction(s): Muscle Pain   Simvastatin Other (See Comments)    stiffness    ROS Review of Systems Negative unless indicated in HPI.    Objective:    Physical Exam Constitutional:      Appearance: Normal appearance.  HENT:     Mouth/Throat:     Mouth: Mucous membranes are moist.  Eyes:      Conjunctiva/sclera: Conjunctivae normal.     Pupils: Pupils are equal, round, and reactive to light.  Cardiovascular:     Rate and Rhythm: Normal rate and regular rhythm.     Pulses: Normal pulses.     Heart sounds: Normal heart sounds.  Pulmonary:     Effort: Pulmonary effort is normal.     Breath sounds: Normal breath sounds.  Musculoskeletal:     Cervical back: Normal range of motion. No tenderness.  Skin:    General: Skin is warm.     Findings: No bruising.  Neurological:     General: No focal deficit present.     Mental Status: She is alert and oriented to person, place, and time. Mental status is at baseline.  Psychiatric:        Mood and Affect: Mood normal.        Behavior: Behavior normal.        Thought Content: Thought content normal.        Judgment: Judgment normal.     BP 124/78   Pulse 89   Temp 98 F (36.7 C)   Ht 4' 9 (1.448 m)   Wt 139 lb (63 kg)   SpO2 96%   BMI 30.08 kg/m  Wt Readings from Last 3 Encounters:  06/19/24 139 lb (63 kg)  06/04/24 140 lb 6.4 oz (63.7 kg)  04/03/24 142 lb (64.4 kg)     Health Maintenance  Topic Date Due   Medicare Annual Wellness (AWV)  12/18/2023   Influenza Vaccine  11/04/2024 (Originally 03/07/2024)   DTaP/Tdap/Td (2 - Td or Tdap) 12/29/2031   Pneumococcal Vaccine: 50+ Years  Completed   DEXA SCAN  Completed   Zoster Vaccines- Shingrix  Completed   Meningococcal B Vaccine  Aged Out   Colonoscopy  Discontinued   COVID-19 Vaccine  Discontinued    There are no preventive care reminders to display for this patient.  Lab Results  Component Value Date   TSH 1.98 02/03/2020   Lab Results  Component Value Date   WBC 11.3 (H) 09/29/2022   HGB 12.7 09/29/2022   HCT 39.1 09/29/2022   MCV 96.3 09/29/2022   PLT 264 09/29/2022   Lab Results  Component Value Date   NA 142 01/21/2024   K 4.8 01/21/2024   CO2 20 01/21/2024   GLUCOSE 88 01/21/2024   BUN 16 01/21/2024   CREATININE 0.89 01/21/2024   BILITOT 0.3  01/21/2024   ALKPHOS 64 01/21/2024   AST 21 01/21/2024   ALT 16 01/21/2024   PROT 6.5 01/21/2024   ALBUMIN 4.5 01/21/2024   CALCIUM  9.5 01/21/2024   ANIONGAP 8 09/29/2022   EGFR 65 01/21/2024   GFR 57.63 (L) 01/12/2023   Lab Results  Component Value Date   CHOL 129 01/21/2024   Lab Results  Component Value Date   HDL 63 01/21/2024   Lab Results  Component Value Date   LDLCALC 48 01/21/2024   Lab Results  Component Value Date   TRIG 100 01/21/2024   Lab Results  Component Value Date   CHOLHDL 2.0 01/21/2024  Lab Results  Component Value Date   HGBA1C 6.3 03/28/2021      Assessment & Plan:   Assessment & Plan Anxiety and depression Intermittent depression managed with Effexor  150 mg and Wellbutrin  300 mg.  Recommended patient to increase the dose of Effexor /Wellbutrin , patient  politely declined. No suicidal ideation. - Continue Effexor  and Wellbutrin . - Referred to therapist for counseling support. - Encouraged participation in church activities and volunteer work    Recurrent falls Recent falls despite wearing orthopedic shoes. Monitoring fall risk with a device. Previous physical therapy improved balance. - Continue wearing orthopedic shoes. - Continue monitoring with the device for two weeks. - Referred to physical therapy for balance improvement.      Assessment & Plan   Follow-up: No follow-ups on file.   Germaine Shenker, NP

## 2024-06-19 NOTE — Telephone Encounter (Signed)
 Copied from CRM [id number]. Topic: Clinical - Prescription Issue >> Jun 19, 2024  4:50 PM Alfonso HERO wrote: Reason for CRM: patient calling because Dr Abbey was supposed to send over Bactrim  to [facility] but its not there.

## 2024-06-19 NOTE — Assessment & Plan Note (Addendum)
 Intermittent depression managed with Effexor  150 mg and Wellbutrin  300 mg.  Recommended patient to increase the dose of Effexor /Wellbutrin , patient  politely declined. No suicidal ideation. - Continue Effexor  and Wellbutrin . - Referred to therapist for counseling support. - Encouraged participation in church activities and volunteer work

## 2024-06-25 NOTE — Assessment & Plan Note (Addendum)
 Recent falls despite wearing orthopedic shoes. Monitoring fall risk with a device. Previous physical therapy improved balance. - Continue wearing orthopedic shoes. - Continue monitoring with the device for two weeks. - Referred to physical therapy for balance improvement.

## 2024-07-08 ENCOUNTER — Encounter: Payer: Self-pay | Admitting: Diagnostic Neuroimaging

## 2024-07-08 ENCOUNTER — Ambulatory Visit: Admitting: Diagnostic Neuroimaging

## 2024-07-08 VITALS — BP 143/82 | HR 80 | Ht 59.0 in | Wt 140.4 lb

## 2024-07-08 DIAGNOSIS — R269 Unspecified abnormalities of gait and mobility: Secondary | ICD-10-CM | POA: Diagnosis not present

## 2024-07-08 DIAGNOSIS — R413 Other amnesia: Secondary | ICD-10-CM | POA: Diagnosis not present

## 2024-07-08 NOTE — Progress Notes (Signed)
 [facility]  PATIENT: Ashley [name] DOB: 04-23-1941  REFERRING CLINICIAN: Abbey Bruckner, MD HISTORY FROM: patient  REASON FOR VISIT: new consult    HISTORICAL  CHIEF COMPLAINT:  Chief Complaint  Patient presents with   RM 7    Patient is here alone for recurrent falls and memory concerns - has frequent falls and not sure the cause.  MMSE 25     HISTORY OF PRESENT ILLNESS:   UPDATE (07/08/24, VRP): Since last visit, here for evaluation of gait and balance difficulty and cognitive issues.  She lives alone since her husband passed away.  She is able to maintain her ADLs.  Having some issues with depression.  PRIOR HPI (03/07/22, VRP): 83 year old female here for evaluation of gait and balance difficulty, short-term memory loss, confusion.  Early May 2023 patient was having some problems with cognitive tasks and texting with her daughter.  She went to the hospital on Dec 22, 2021 for dizziness and unsteadiness.  CT and MRI were obtained and unremarkable.  Patient was discharged with meclizine .  Patient returned on Dec 28 2021 due to severe fall and injury.  She fell forward lost her balance and landed on her face on concrete.  She had severe bruising and injury.  CAT scan demonstrated small periventricular intraparenchymal hemorrhage, possibly posttraumatic versus hypertensive.  Patient was admitted for evaluation.  She was found to have severely low vitamin B12 level of 190.   REVIEW OF SYSTEMS: Full 14 system review of systems performed and negative with exception of: as per HPI.  ALLERGIES: Allergies  Allergen Reactions   Ace Inhibitors Other (See Comments)    Angioedema   Pravastatin Other (See Comments)    Other reaction(s): Muscle Pain   Simvastatin Other (See Comments)    stiffness    HOME MEDICATIONS: Outpatient Medications Prior to Visit  Medication Sig Dispense Refill   aspirin  EC 81 MG tablet Take 81 mg by mouth daily. Swallow whole.      buPROPion  (WELLBUTRIN  XL) 300 MG 24 hr tablet Take 1 tablet (300 mg total) by mouth daily. 90 tablet 3   cholecalciferol (VITAMIN D3) 25 MCG (1000 UNIT) tablet Take 1 tablet (1,000 Units total) by mouth daily. 90 tablet 3   Cranberry-Vitamin C-Probiotic (AZO CRANBERRY PO) Take by mouth.     cyanocobalamin  (VITAMIN B12) 1000 MCG tablet Take 1 tablet (1,000 mcg total) by mouth daily. 90 tablet 3   estradiol  (ESTRACE ) 0.1 MG/GM vaginal cream Estrogen Cream Instruction Discard applicator Apply pea sized amount to tip of finger to urethra before bed. Wash hands well after application. Use Monday, Wednesday and Friday 42.5 g 12   fluticasone  (FLONASE ) 50 MCG/ACT nasal spray PLACE ONE (1) TO TWO (2) SPRAYS INTO BOTH NOSTRILS DAILY AS DIRECTED 16 g 2   gabapentin  (NEURONTIN ) 100 MG capsule Take 1 capsule (100 mg total) by mouth at bedtime. 90 capsule 1   Magnesium  500 MG CAPS Take by mouth.     metoprolol  tartrate (LOPRESSOR ) 25 MG tablet Take 0.5 tablets (12.5 mg total) by mouth daily. 45 tablet 1   omeprazole  (PRILOSEC) 20 MG capsule Take 1 capsule (20 mg total) by mouth daily. 90 capsule 3   Polyethyl Glycol-Propyl Glycol (SYSTANE) 0.4-0.3 % SOLN Place 1 drop into both eyes 3 (three) times daily as needed (dry/irritated eyes.).     rosuvastatin  (CRESTOR ) 40 MG tablet Take 1 tablet (40 mg total) by mouth daily. 90 tablet 3   TURMERIC PO Take by mouth.  venlafaxine  XR (EFFEXOR -XR) 150 MG 24 hr capsule Take 1 capsule (150 mg total) by mouth daily. 90 capsule 3   EPINEPHrine  (EPIPEN  2-PAK) 0.3 mg/0.3 mL IJ SOAJ injection Inject 0.3 mLs (0.3 mg total) into the muscle as needed for anaphylaxis. (Patient not taking: Reported on 07/08/2024) 1 each 1   ibuprofen (ADVIL) 100 MG tablet Take 100 mg by mouth every 6 (six) hours as needed for fever (Patient takes 3 tablets a day). (Patient not taking: Reported on 07/08/2024)     Melatonin 10 MG TABS Take 10 mg by mouth at bedtime. (Patient not taking: Reported on  07/08/2024)     No facility-administered medications prior to visit.    PAST MEDICAL HISTORY: Past Medical History:  Diagnosis Date   Abnormal uterine bleeding (AUB)    a. benign endometrial polyps on D+C in 03/2017   Anxiety    Arthritis    Carpal tunnel syndrome of left wrist    Cerebral hemorrhage (HCC) 2023   Chicken pox    Depression    GERD (gastroesophageal reflux disease)    History of stress test    a. 08/2017 Lexiscan  MV: EF 55-65%. No ischemia/infarct. Low risk.   Hyperlipidemia    Hypertension    Lacunar infarction (HCC)    a. Dx in setting of dizziness/vertigo; b. 02/2018 Carotid U/S: bilateral intimal thickening and atherosclerotic plaque. No hemodynamically significant stenoses; c. 03/2018 MRI Brain: No mass or abnl enhancement. Chronic microvascular ischemic changes and mod volume loss of the brain. 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   Stroke (HCC)    PT STATES SHE WAS TOLD THIS BY HER PCP BUT PT STATES SHE NEVER KNEW SHE HAD A STROKE-NO RESIDUAL EFFECTS   Vertigo     PAST SURGICAL HISTORY: Past Surgical History:  Procedure Laterality Date   BACK SURGERY  11/19/2017   lumbar laminectomy   CARPAL TUNNEL RELEASE Right 07/30/2019   CARPAL TUNNEL RELEASE Left 02/21/2021   Procedure: CARPAL TUNNEL RELEASE;  Surgeon: Mardee Lynwood SQUIBB, MD;  Location: [facility];  Service: Orthopedics;  Laterality: Left;   COLONOSCOPY     2003, 2011, 2021   COLONOSCOPY WITH PROPOFOL  N/A 12/24/2019   Procedure: COLONOSCOPY WITH PROPOFOL ;  Surgeon: Toledo, Ladell POUR, MD;  Location: [facility];  Service: Gastroenterology;  Laterality: N/A;   CYSTOSCOPY MACROPLASTIQUE IMPLANT N/A 05/15/2019   Procedure: CYSTOSCOPY MACROPLASTIQUE IMPLANT;  Surgeon: Kassie Ozell SAUNDERS, MD;  Location: [facility];  Service: Urology;  Laterality: N/A;    DILATATION & CURETTAGE/HYSTEROSCOPY WITH MYOSURE N/A 03/30/2017   Procedure: DILATATION & CURETTAGE/HYSTEROSCOPY WITH MYOSURE;  Surgeon: Schermerhorn, Debby PARAS, MD;  Location: [facility];  Service: Gynecology;  Laterality: N/A;   EYE SURGERY Bilateral    Cataract Extraction with IOL   JOINT REPLACEMENT     KNEE ARTHROPLASTY Left 12/01/2020   Procedure: COMPUTER ASSISTED TOTAL KNEE ARTHROPLASTY;  Surgeon: Mardee Lynwood SQUIBB, MD;  Location: [facility];  Service: Orthopedics;  Laterality: Left;   SKIN LESION EXCISION Right    synovial cyst right AC joint   TONSILLECTOMY AND ADENOIDECTOMY     Age 82    FAMILY HISTORY: Family History  Problem Relation Age of Onset   Rheum arthritis Mother    Heart disease Father    Aneurysm Father    Ovarian cancer Sister    COPD Sister  CAD Sister    Cancer Brother    Thrombosis Maternal Grandmother        a. DVT   Thrombosis Maternal Grandfather        a. DVT   Thrombosis Paternal Grandmother        a. DVT   Thrombosis Paternal Grandfather        a. DVT   Seizures Neg Hx    Stroke Neg Hx     SOCIAL HISTORY: Social History   Socioeconomic History   Marital status: Widowed    Spouse name: Emil   Number of children: 1   Years of education: Not on file   Highest education level: Bachelor's degree (e.g., BA, AB, BS)  Occupational History   Not on file  Tobacco Use   Smoking status: Former    Current packs/day: 0.00    Average packs/day: 0.5 packs/day for 15.0 years (7.5 ttl pk-yrs)    Types: Cigarettes    Start date: 08/07/1961    Quit date: 08/07/1976    Years since quitting: 47.9   Smokeless tobacco: Never  Vaping Use   Vaping status: Never Used  Substance and Sexual Activity   Alcohol  use: Yes    Alcohol /week: 1.0 standard drink of alcohol     Types: 1 Glasses of wine per week    Comment: ocassionally   Drug use: No   Sexual activity: Not Currently  Other Topics Concern   Not on file  Social History Narrative   03/07/22 Married     1/2 a cup of coffee daily    Social Drivers of Corporate Investment Banker Strain: Low Risk  (09/24/2023)   Received from [facility]   Overall Financial Resource Strain (CARDIA)    Difficulty of Paying Living Expenses: Not hard at all  Food Insecurity: No Food Insecurity (09/24/2023)   Received from [facility]   Hunger Vital Sign    Within the past 12 months, you worried that your food would run out before you got the money to buy more.: Never true    Within the past 12 months, the food you bought just didn't last and you didn't have money to get more.: Never true  Transportation Needs: No Transportation Needs (09/24/2023)   Received from [facility] - Transportation    In the past 12 months, has lack of transportation kept you from medical appointments or from getting medications?: No    Lack of Transportation (Non-Medical): No  Physical Activity: Insufficiently Active (12/18/2022)   Exercise Vital Sign    Days of Exercise per Week: 7 days    Minutes of Exercise per Session: 20 min  Stress: No Stress Concern Present (12/18/2022)   [employer] of Occupational Health - Occupational Stress Questionnaire    Feeling of Stress : Not at all  Social Connections: Moderately Integrated (12/18/2022)   Social Connection and Isolation Panel    Frequency of Communication with Friends and Family: More than three times a week    Frequency of Social Gatherings with Friends and Family: More than three times a week    Attends Religious Services: More than 4 times per year    Active Member of [employer] or Organizations: Yes    Attends Banker Meetings: More than 4 times per year    Marital Status: Widowed  Intimate Partner Violence: Not At Risk (12/18/2022)   Humiliation, Afraid, Rape, and Kick questionnaire    Fear of  Current or Ex-Partner: No    Emotionally Abused: No    Physically Abused: No    Sexually Abused: No      PHYSICAL EXAM  GENERAL EXAM/CONSTITUTIONAL: Vitals:  Vitals:   07/08/24 1015  BP: (!) 143/82  Pulse: 80  Weight: 140 lb 6.4 oz (63.7 kg)  Height: 4' 11 (1.499 m)   Body mass index is 28.36 kg/m. Wt Readings from Last 3 Encounters:  07/08/24 140 lb 6.4 oz (63.7 kg)  06/19/24 139 lb (63 kg)  06/04/24 140 lb 6.4 oz (63.7 kg)   Patient is in no distress; well developed, nourished and groomed; neck is supple  CARDIOVASCULAR: Examination of carotid arteries is normal; no carotid bruits Regular rate and rhythm, no murmurs Examination of peripheral vascular system by observation and palpation is normal  EYES: Ophthalmoscopic exam of optic discs and posterior segments is normal; no papilledema or hemorrhages No results found.  MUSCULOSKELETAL: Gait, strength, tone, movements noted in Neurologic exam below  NEUROLOGIC: MENTAL STATUS:     07/08/2024   10:12 AM 03/07/2022   12:11 PM  MMSE - Mini Mental State Exam  Orientation to time 3 5  Orientation to Place 4 5  Registration 3 3  Attention/ Calculation 5 4  Recall 3 2  Language- name 2 objects 2 2  Language- repeat 0 0  Language- follow 3 step command 3 3  Language- read & follow direction 1 1  Write a sentence 1 1  Copy design 0 1  Total score 25 27    awake, alert, oriented to person, place and time recent and remote memory intact normal attention and concentration language fluent, comprehension intact, naming intact fund of knowledge appropriate  CRANIAL NERVE:  2nd - no papilledema on fundoscopic exam 2nd, 3rd, 4th, 6th - pupils equal and reactive to light, visual fields full to confrontation, extraocular muscles intact, no nystagmus 5th - facial sensation symmetric 7th - facial strength symmetric 8th - hearing intact 9th - palate elevates symmetrically, uvula midline 11th - shoulder shrug symmetric 12th - tongue protrusion midline  MOTOR:  normal bulk and tone, full strength in the BUE,  BLE  SENSORY:  normal and symmetric to light touch, temperature, vibration  COORDINATION:  finger-nose-finger, fine finger movements normal  REFLEXES:  deep tendon reflexes TRACE and symmetric  GAIT/STATION:  narrow based gait; SLOW AND CAREFUL     DIAGNOSTIC DATA (LABS, IMAGING, TESTING) - I reviewed patient records, labs, notes, testing and imaging myself where available.  Lab Results  Component Value Date   WBC 11.3 (H) 09/29/2022   HGB 12.7 09/29/2022   HCT 39.1 09/29/2022   MCV 96.3 09/29/2022   PLT 264 09/29/2022      Component Value Date/Time   NA 142 01/21/2024 1354   K 4.8 01/21/2024 1354   CL 104 01/21/2024 1354   CO2 20 01/21/2024 1354   GLUCOSE 88 01/21/2024 1354   GLUCOSE 118 (H) 01/12/2023 1231   BUN 16 01/21/2024 1354   CREATININE 0.89 01/21/2024 1354   CREATININE 0.69 03/11/2018 1648   CALCIUM  9.5 01/21/2024 1354   PROT 6.5 01/21/2024 1354   ALBUMIN 4.5 01/21/2024 1354   AST 21 01/21/2024 1354   ALT 16 01/21/2024 1354   ALKPHOS 64 01/21/2024 1354   BILITOT 0.3 01/21/2024 1354   GFRNONAA >60 09/29/2022 0311   GFRNONAA 86 03/11/2018 1648   GFRAA >60 05/12/2019 1008   GFRAA 99 03/11/2018 1648   Lab Results  Component Value Date  CHOL 129 01/21/2024   HDL 63 01/21/2024   LDLCALC 48 01/21/2024   LDLDIRECT 63.0 01/12/2023   TRIG 100 01/21/2024   CHOLHDL 2.0 01/21/2024   Lab Results  Component Value Date   HGBA1C 6.3 03/28/2021   Lab Results  Component Value Date   VITAMINB12 604 01/21/2024   Lab Results  Component Value Date   TSH 1.98 02/03/2020     02/20/22 MRI brain 1. No acute intracranial abnormality. 2. Old hemorrhage in the left basal ganglia. 3. Findings of chronic ischemic microangiopathy and volume loss.  12/28/21 CTA head / neck 1. Redemonstrated small area of acute hemorrhage along the lateral margin of the left lateral ventricle, which is minimally increased in size compared to the CT head from 2:07 p.m. no  additional acute intracranial process. 2. No intracranial large vessel occlusion. Mild stenosis in the cavernous and supraclinoid ICAs, and mild-to-moderate stenosis in the proximal right V4.  12/22/21 MRI brain 1. No acute intracranial abnormality. 2. Advanced chronic small vessel ischemic disease and cerebral volume loss.   05/01/22 MRI cervical spine 1. Advanced cervical spine degeneration with multilevel mild spondylolisthesis, progressed since a 2018 MRI. Mild if any degenerative endplate marrow edema and no other acute osseous abnormality. 2. Multifactorial spinal stenosis C3-C4 through C6-C7, up to moderate at C4-C5. Spinal cord mass effect at each level, but no cord edema or myelomalacia is identified. 3. Moderate or severe neural foraminal stenosis at the left C4, bilateral C5, right C6, and bilateral C8 nerve levels.  05/01/22 MRI lumbar spine 1. Largely stable MRI appearance of the lumbar spine since 2020. 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 2. Advanced chronic disc and endplate degeneration L3-L4 through L5-S1 with previous laminectomies. No residual spinal stenosis but moderate or severe neural foraminal stenosis at the bilateral L3, L4, and L5 nerve levels which has not significantly changed.    ASSESSMENT AND PLAN  83 y.o. year old female here with:  Dx:  1. Mild memory disturbance   2. Gait difficulty    PLAN:  MILD GAIT DIFFICULTY (due to cervical and lumbar spinal stenosis; right knee arthritis) - follow up with PT evaluation and treatment - try water therapy  MILD MEMORY LOSS (mild cognitive impairment; chronic small vessel ischemic disease; no changes in ADLs) - continue lipid and BP management - try to stay active physically and get some exercise (at least 15-30 minutes per day) - eat a nutritious diet with lean protein, plants / vegetables, whole grains; avoid ultra-processed  foods - increase social activities, brain stimulation, games, puzzles, hobbies, crafts, arts, music; try new activities; keep it fun! - aim for at least 7-8 hours sleep per night (or more) - avoid smoking and alcohol  - caution with medications, finances, driving - safety / supervision issues reviewed  Return for return to PCP, pending if symptoms worsen or fail to improve.    EDUARD FABIENE HANLON, MD 07/08/2024, 10:55 AM Certified in Neurology, Neurophysiology and Neuroimaging  [facility] [address] [place], [place] [address] [phone]

## 2024-07-08 NOTE — Patient Instructions (Addendum)
  MILD GAIT DIFFICULTY (due to cervical and lumbar spinal stenosis; right knee arthritis) - follow up with PT evaluation and treatment - try water therapy  MILD MEMORY LOSS (mild cognitive impairment; no changes in ADLs) - try to stay active physically and get some exercise (at least 15-30 minutes per day) - eat a nutritious diet with lean protein, plants / vegetables, whole grains; avoid ultra-processed foods - increase social activities, brain stimulation, games, puzzles, hobbies, crafts, arts, music; try new activities; keep it fun! - aim for at least 7-8 hours sleep per night (or more) - avoid smoking and alcohol  - caution with medications, finances, driving - safety / supervision issues reviewed

## 2024-07-11 NOTE — Telephone Encounter (Signed)
 Second attempted Left detailed message for patient on her cell voicemail to make aware of Dr Graylon recommendations. Patient advised to give our office a call back to discuss.   OK for E2C2 to give note if patient calls back. If relayed, please notify the office.

## 2024-07-13 DIAGNOSIS — R296 Repeated falls: Secondary | ICD-10-CM | POA: Diagnosis not present

## 2024-07-17 ENCOUNTER — Telehealth: Payer: Self-pay

## 2024-07-17 NOTE — Telephone Encounter (Signed)
 Recommend office visit.  Akyah Lagrange, MD\

## 2024-07-17 NOTE — Telephone Encounter (Signed)
 Spoke with patient to make her aware of recent recommendations per Dr Abbey. Verbalized understanding. Patient states she wanted the provider to know her sister passed away last week. Patient states the Metoprolol  25 mg medication was prescribed not sure for what reason. Patient states she takes a half of pill every day, but toward the end of the night day, she says she has lost all energy. Patient states she thinks she doesn't operate the same as if her heart rate was at 100. Please advise?

## 2024-07-17 NOTE — Telephone Encounter (Signed)
 Left message for patient to give our office a call back to get scheduled to discuss concerns with provider per provider's request.  OK for E2C2 to get patient scheduled for an appointment if pt calls back. If scheduled, please notify the office.

## 2024-07-17 NOTE — Telephone Encounter (Signed)
 Copied from CRM #[id number]. Topic: Clinical - Prescription Issue >> Jul 16, 2024  4:13 PM Ashley [name] wrote: Reason for CRM: patient called stating the gabapentin  (NEURONTIN ) 100 MG capsule has too many side effects for elderly people and she wants to gradually stop taking it and wants to know how. Asking for a call back.

## 2024-07-17 NOTE — Telephone Encounter (Signed)
 Provider is booking our until March for OV. Patient was advised to make an appointment with her to discuss medication . When would provider like for her to be seen ,or could she be seen with a different provider?

## 2024-07-17 NOTE — Telephone Encounter (Signed)
 Patient last mychart login: 03/06/24. Please share following message sent through mychart:   Hello,   I got your message on interest in tapering off Gabapentin  100 mg, here is my recommendations:  - Take Gabapentin  100 mg every other day for 2 weeks then stop. You may notice withdrawal symptoms like anxiety, difficulty sleeping. If these symptoms are unbearable please restart Gabapentin  100 mg daily and schedule an appointment with us .    Take care,  Dhara Schepp, MD

## 2024-07-30 NOTE — Telephone Encounter (Signed)
 Patient is scheduled for 09/03/24.

## 2024-08-11 ENCOUNTER — Telehealth: Payer: Self-pay

## 2024-08-11 NOTE — Telephone Encounter (Signed)
 Copied from CRM [id number]. Topic: Clinical - Lab/Test Results >> Aug 11, 2024  1:51 PM Ashley [name] wrote: Reason for CRM: Patient was instructed to wear a Heart moitaor for 14 days - the patient stated she never received any feedback or results on those tests and wanted to know if there was anything she should know.    Please Follow up with her ASAP.

## 2024-08-12 NOTE — Telephone Encounter (Signed)
 Tried calling patient on the phone but she did not pick up the call.  I called her daughter Monica [facility] okay) and discussed ZIO monitor result.  Discussed that ZIO monitor showed only rare extra heartbeats which is common for patients age.  Average heart rate was 73 bpm.  Consider reducing or discontinuing metoprolol  tartrate 12.5 mg during upcoming appointment on 09/03/2024.   All questions answered and request Kris/patient reach out to our clinic with questions or concerns.  Luke Shade, MD

## 2024-09-03 ENCOUNTER — Ambulatory Visit

## 2024-09-09 ENCOUNTER — Telehealth: Payer: Self-pay

## 2024-09-09 NOTE — Telephone Encounter (Signed)
 Received CRM that pt would need to reschedule 2/5/202 due to weather. Left VM for pt to call back to reschedule.  E2C2, please reschedule pt to next available appt with PCP -kh

## 2024-09-11 ENCOUNTER — Ambulatory Visit
